# Patient Record
Sex: Female | Born: 1965
Health system: Southern US, Community
[De-identification: ages and names within clinical notes are randomized; demographics above are authoritative.]

## PROBLEM LIST (undated history)

## (undated) DIAGNOSIS — J40 Bronchitis, not specified as acute or chronic: Secondary | ICD-10-CM

## (undated) DIAGNOSIS — N63 Unspecified lump in unspecified breast: Secondary | ICD-10-CM

## (undated) DIAGNOSIS — N200 Calculus of kidney: Secondary | ICD-10-CM

## (undated) DIAGNOSIS — E669 Obesity, unspecified: Secondary | ICD-10-CM

## (undated) DIAGNOSIS — N920 Excessive and frequent menstruation with regular cycle: Secondary | ICD-10-CM

## (undated) DIAGNOSIS — E282 Polycystic ovarian syndrome: Secondary | ICD-10-CM

## (undated) DIAGNOSIS — K219 Gastro-esophageal reflux disease without esophagitis: Secondary | ICD-10-CM

## (undated) DIAGNOSIS — F329 Major depressive disorder, single episode, unspecified: Secondary | ICD-10-CM

## (undated) DIAGNOSIS — B009 Herpesviral infection, unspecified: Secondary | ICD-10-CM

## (undated) DIAGNOSIS — E876 Hypokalemia: Secondary | ICD-10-CM

## (undated) DIAGNOSIS — F32A Depression, unspecified: Secondary | ICD-10-CM

## (undated) DIAGNOSIS — K602 Anal fissure, unspecified: Secondary | ICD-10-CM

## (undated) DIAGNOSIS — G4733 Obstructive sleep apnea (adult) (pediatric): Secondary | ICD-10-CM

## (undated) DIAGNOSIS — G5603 Carpal tunnel syndrome, bilateral upper limbs: Secondary | ICD-10-CM

## (undated) DIAGNOSIS — G4730 Sleep apnea, unspecified: Secondary | ICD-10-CM

## (undated) HISTORY — DX: Carpal tunnel syndrome, bilateral upper limbs: G56.03

## (undated) HISTORY — DX: Calculus of kidney: N20.0

## (undated) HISTORY — DX: Hypokalemia: E87.6

## (undated) HISTORY — DX: Polycystic ovarian syndrome: E28.2

## (undated) HISTORY — DX: Herpesviral infection, unspecified: B00.9

## (undated) HISTORY — DX: Anal fissure, unspecified: K60.2

## (undated) HISTORY — DX: Gastro-esophageal reflux disease without esophagitis: K21.9

## (undated) HISTORY — DX: Major depressive disorder, single episode, unspecified: F32.9

## (undated) HISTORY — DX: Obstructive sleep apnea (adult) (pediatric): G47.33

## (undated) HISTORY — DX: Excessive and frequent menstruation with regular cycle: N92.0

## (undated) HISTORY — DX: Unspecified lump in unspecified breast: N63.0

## (undated) HISTORY — DX: Bronchitis, not specified as acute or chronic: J40

## (undated) HISTORY — DX: Sleep apnea, unspecified: G47.30

## (undated) HISTORY — DX: Depression, unspecified: F32.A

## (undated) HISTORY — DX: Obesity, unspecified: E66.9

---

## 1989-12-30 HISTORY — PX: TUBAL LIGATION: SHX77

## 1998-08-22 ENCOUNTER — Emergency Department (HOSPITAL_COMMUNITY): Admission: EM | Admit: 1998-08-22 | Discharge: 1998-08-22 | Payer: Self-pay | Admitting: Emergency Medicine

## 1998-10-24 ENCOUNTER — Emergency Department (HOSPITAL_COMMUNITY): Admission: EM | Admit: 1998-10-24 | Discharge: 1998-10-24 | Payer: Self-pay | Admitting: Emergency Medicine

## 1999-01-22 ENCOUNTER — Emergency Department (HOSPITAL_COMMUNITY): Admission: EM | Admit: 1999-01-22 | Discharge: 1999-01-22 | Payer: Self-pay | Admitting: Emergency Medicine

## 1999-05-27 ENCOUNTER — Emergency Department (HOSPITAL_COMMUNITY): Admission: EM | Admit: 1999-05-27 | Discharge: 1999-05-27 | Payer: Self-pay | Admitting: Emergency Medicine

## 1999-05-27 ENCOUNTER — Encounter: Payer: Self-pay | Admitting: Emergency Medicine

## 1999-08-12 ENCOUNTER — Emergency Department (HOSPITAL_COMMUNITY): Admission: EM | Admit: 1999-08-12 | Discharge: 1999-08-12 | Payer: Self-pay | Admitting: Emergency Medicine

## 1999-09-01 ENCOUNTER — Emergency Department (HOSPITAL_COMMUNITY): Admission: EM | Admit: 1999-09-01 | Discharge: 1999-09-01 | Payer: Self-pay | Admitting: Emergency Medicine

## 2000-01-18 ENCOUNTER — Emergency Department (HOSPITAL_COMMUNITY): Admission: EM | Admit: 2000-01-18 | Discharge: 2000-01-18 | Payer: Self-pay | Admitting: Emergency Medicine

## 2000-06-29 ENCOUNTER — Emergency Department (HOSPITAL_COMMUNITY): Admission: EM | Admit: 2000-06-29 | Discharge: 2000-06-30 | Payer: Self-pay | Admitting: Emergency Medicine

## 2001-05-17 ENCOUNTER — Emergency Department (HOSPITAL_COMMUNITY): Admission: EM | Admit: 2001-05-17 | Discharge: 2001-05-17 | Payer: Self-pay | Admitting: Emergency Medicine

## 2001-05-17 ENCOUNTER — Encounter: Payer: Self-pay | Admitting: Emergency Medicine

## 2001-10-03 ENCOUNTER — Emergency Department (HOSPITAL_COMMUNITY): Admission: EM | Admit: 2001-10-03 | Discharge: 2001-10-03 | Payer: Self-pay | Admitting: Emergency Medicine

## 2002-01-13 ENCOUNTER — Encounter: Admission: RE | Admit: 2002-01-13 | Discharge: 2002-01-13 | Payer: Self-pay

## 2002-01-15 ENCOUNTER — Encounter: Admission: RE | Admit: 2002-01-15 | Discharge: 2002-01-15 | Payer: Self-pay | Admitting: Internal Medicine

## 2002-01-27 ENCOUNTER — Encounter: Admission: RE | Admit: 2002-01-27 | Discharge: 2002-01-27 | Payer: Self-pay

## 2002-02-03 ENCOUNTER — Emergency Department (HOSPITAL_COMMUNITY): Admission: EM | Admit: 2002-02-03 | Discharge: 2002-02-03 | Payer: Self-pay | Admitting: Physical Therapy

## 2002-02-09 ENCOUNTER — Encounter: Admission: RE | Admit: 2002-02-09 | Discharge: 2002-02-09 | Payer: Self-pay | Admitting: Internal Medicine

## 2002-03-02 ENCOUNTER — Encounter: Admission: RE | Admit: 2002-03-02 | Discharge: 2002-03-02 | Payer: Self-pay | Admitting: Internal Medicine

## 2002-03-19 ENCOUNTER — Encounter: Admission: RE | Admit: 2002-03-19 | Discharge: 2002-03-19 | Payer: Self-pay | Admitting: Internal Medicine

## 2002-03-29 ENCOUNTER — Emergency Department (HOSPITAL_COMMUNITY): Admission: EM | Admit: 2002-03-29 | Discharge: 2002-03-29 | Payer: Self-pay | Admitting: Emergency Medicine

## 2002-04-05 ENCOUNTER — Encounter: Admission: RE | Admit: 2002-04-05 | Discharge: 2002-04-05 | Payer: Self-pay | Admitting: Internal Medicine

## 2002-06-08 ENCOUNTER — Encounter: Admission: RE | Admit: 2002-06-08 | Discharge: 2002-06-08 | Payer: Self-pay | Admitting: Internal Medicine

## 2002-06-23 ENCOUNTER — Emergency Department (HOSPITAL_COMMUNITY): Admission: EM | Admit: 2002-06-23 | Discharge: 2002-06-23 | Payer: Self-pay | Admitting: Emergency Medicine

## 2002-07-01 ENCOUNTER — Encounter: Admission: RE | Admit: 2002-07-01 | Discharge: 2002-07-01 | Payer: Self-pay | Admitting: Internal Medicine

## 2002-07-07 ENCOUNTER — Encounter: Payer: Self-pay | Admitting: Emergency Medicine

## 2002-07-07 ENCOUNTER — Emergency Department (HOSPITAL_COMMUNITY): Admission: EM | Admit: 2002-07-07 | Discharge: 2002-07-07 | Payer: Self-pay | Admitting: Emergency Medicine

## 2002-07-08 ENCOUNTER — Encounter: Admission: RE | Admit: 2002-07-08 | Discharge: 2002-07-08 | Payer: Self-pay | Admitting: Internal Medicine

## 2002-09-01 ENCOUNTER — Encounter: Admission: RE | Admit: 2002-09-01 | Discharge: 2002-09-01 | Payer: Self-pay | Admitting: Internal Medicine

## 2002-09-08 ENCOUNTER — Encounter: Admission: RE | Admit: 2002-09-08 | Discharge: 2002-09-08 | Payer: Self-pay | Admitting: Internal Medicine

## 2002-09-10 ENCOUNTER — Ambulatory Visit (HOSPITAL_COMMUNITY): Admission: RE | Admit: 2002-09-10 | Discharge: 2002-09-10 | Payer: Self-pay | Admitting: Internal Medicine

## 2002-11-10 ENCOUNTER — Encounter: Admission: RE | Admit: 2002-11-10 | Discharge: 2002-11-10 | Payer: Self-pay | Admitting: Internal Medicine

## 2002-11-23 ENCOUNTER — Encounter: Admission: RE | Admit: 2002-11-23 | Discharge: 2003-02-21 | Payer: Self-pay | Admitting: Internal Medicine

## 2003-01-27 ENCOUNTER — Encounter: Admission: RE | Admit: 2003-01-27 | Discharge: 2003-01-27 | Payer: Self-pay | Admitting: Internal Medicine

## 2003-02-17 ENCOUNTER — Ambulatory Visit (HOSPITAL_COMMUNITY): Admission: RE | Admit: 2003-02-17 | Discharge: 2003-02-17 | Payer: Self-pay | Admitting: Internal Medicine

## 2003-03-01 ENCOUNTER — Encounter: Admission: RE | Admit: 2003-03-01 | Discharge: 2003-03-01 | Payer: Self-pay | Admitting: Internal Medicine

## 2003-03-28 ENCOUNTER — Encounter: Admission: RE | Admit: 2003-03-28 | Discharge: 2003-03-28 | Payer: Self-pay | Admitting: Internal Medicine

## 2003-04-04 ENCOUNTER — Encounter: Admission: RE | Admit: 2003-04-04 | Discharge: 2003-04-04 | Payer: Self-pay | Admitting: Internal Medicine

## 2003-04-19 ENCOUNTER — Encounter: Admission: RE | Admit: 2003-04-19 | Discharge: 2003-04-19 | Payer: Self-pay | Admitting: Internal Medicine

## 2003-06-06 ENCOUNTER — Encounter: Admission: RE | Admit: 2003-06-06 | Discharge: 2003-06-06 | Payer: Self-pay | Admitting: Internal Medicine

## 2003-06-23 ENCOUNTER — Encounter: Admission: RE | Admit: 2003-06-23 | Discharge: 2003-06-23 | Payer: Self-pay | Admitting: Internal Medicine

## 2003-07-07 ENCOUNTER — Ambulatory Visit (HOSPITAL_COMMUNITY): Admission: RE | Admit: 2003-07-07 | Discharge: 2003-07-07 | Payer: Self-pay | Admitting: Internal Medicine

## 2003-07-07 ENCOUNTER — Encounter: Admission: RE | Admit: 2003-07-07 | Discharge: 2003-07-07 | Payer: Self-pay | Admitting: Internal Medicine

## 2003-07-07 ENCOUNTER — Encounter: Payer: Self-pay | Admitting: Internal Medicine

## 2003-07-19 ENCOUNTER — Encounter: Admission: RE | Admit: 2003-07-19 | Discharge: 2003-07-19 | Payer: Self-pay | Admitting: Internal Medicine

## 2003-08-25 ENCOUNTER — Encounter: Admission: RE | Admit: 2003-08-25 | Discharge: 2003-08-25 | Payer: Self-pay | Admitting: Internal Medicine

## 2003-10-12 ENCOUNTER — Encounter: Admission: RE | Admit: 2003-10-12 | Discharge: 2003-10-12 | Payer: Self-pay | Admitting: Internal Medicine

## 2003-12-11 ENCOUNTER — Emergency Department (HOSPITAL_COMMUNITY): Admission: AD | Admit: 2003-12-11 | Discharge: 2003-12-11 | Payer: Self-pay | Admitting: Family Medicine

## 2004-01-12 ENCOUNTER — Encounter: Admission: RE | Admit: 2004-01-12 | Discharge: 2004-01-12 | Payer: Self-pay | Admitting: Internal Medicine

## 2004-06-15 ENCOUNTER — Encounter: Admission: RE | Admit: 2004-06-15 | Discharge: 2004-06-15 | Payer: Self-pay | Admitting: Internal Medicine

## 2004-06-22 ENCOUNTER — Encounter: Admission: RE | Admit: 2004-06-22 | Discharge: 2004-06-22 | Payer: Self-pay | Admitting: Internal Medicine

## 2004-07-23 ENCOUNTER — Encounter: Admission: RE | Admit: 2004-07-23 | Discharge: 2004-10-21 | Payer: Self-pay | Admitting: Internal Medicine

## 2004-08-28 ENCOUNTER — Encounter: Admission: RE | Admit: 2004-08-28 | Discharge: 2004-08-28 | Payer: Self-pay | Admitting: Internal Medicine

## 2004-09-21 ENCOUNTER — Ambulatory Visit: Payer: Self-pay | Admitting: Internal Medicine

## 2004-11-14 ENCOUNTER — Ambulatory Visit: Payer: Self-pay | Admitting: Internal Medicine

## 2004-11-29 ENCOUNTER — Encounter: Admission: RE | Admit: 2004-11-29 | Discharge: 2005-02-27 | Payer: Self-pay | Admitting: Internal Medicine

## 2005-01-17 ENCOUNTER — Ambulatory Visit: Payer: Self-pay | Admitting: Internal Medicine

## 2005-01-23 ENCOUNTER — Ambulatory Visit (HOSPITAL_BASED_OUTPATIENT_CLINIC_OR_DEPARTMENT_OTHER): Admission: RE | Admit: 2005-01-23 | Discharge: 2005-01-23 | Payer: Self-pay | Admitting: Internal Medicine

## 2005-04-19 ENCOUNTER — Ambulatory Visit: Payer: Self-pay | Admitting: Internal Medicine

## 2005-11-11 ENCOUNTER — Emergency Department (HOSPITAL_COMMUNITY): Admission: EM | Admit: 2005-11-11 | Discharge: 2005-11-11 | Payer: Self-pay | Admitting: *Deleted

## 2005-11-19 ENCOUNTER — Ambulatory Visit: Payer: Self-pay | Admitting: Internal Medicine

## 2005-12-08 ENCOUNTER — Emergency Department (HOSPITAL_COMMUNITY): Admission: EM | Admit: 2005-12-08 | Discharge: 2005-12-08 | Payer: Self-pay | Admitting: Emergency Medicine

## 2006-01-13 ENCOUNTER — Ambulatory Visit: Payer: Self-pay | Admitting: Internal Medicine

## 2006-01-18 ENCOUNTER — Emergency Department (HOSPITAL_COMMUNITY): Admission: EM | Admit: 2006-01-18 | Discharge: 2006-01-18 | Payer: Self-pay | Admitting: Emergency Medicine

## 2006-01-31 ENCOUNTER — Ambulatory Visit: Payer: Self-pay | Admitting: Internal Medicine

## 2006-03-25 ENCOUNTER — Ambulatory Visit: Payer: Self-pay | Admitting: Internal Medicine

## 2006-04-09 ENCOUNTER — Ambulatory Visit: Payer: Self-pay | Admitting: Internal Medicine

## 2006-04-21 ENCOUNTER — Ambulatory Visit: Payer: Self-pay | Admitting: Internal Medicine

## 2006-04-29 ENCOUNTER — Ambulatory Visit: Payer: Self-pay | Admitting: Hospitalist

## 2006-05-02 ENCOUNTER — Emergency Department (HOSPITAL_COMMUNITY): Admission: EM | Admit: 2006-05-02 | Discharge: 2006-05-02 | Payer: Self-pay | Admitting: Family Medicine

## 2006-05-06 ENCOUNTER — Ambulatory Visit: Payer: Self-pay | Admitting: Internal Medicine

## 2006-05-09 ENCOUNTER — Ambulatory Visit: Payer: Self-pay | Admitting: *Deleted

## 2006-05-20 ENCOUNTER — Ambulatory Visit: Payer: Self-pay | Admitting: Internal Medicine

## 2006-06-11 ENCOUNTER — Ambulatory Visit: Payer: Self-pay | Admitting: Internal Medicine

## 2006-06-17 ENCOUNTER — Ambulatory Visit: Payer: Self-pay | Admitting: Internal Medicine

## 2006-06-18 ENCOUNTER — Ambulatory Visit: Payer: Self-pay | Admitting: Internal Medicine

## 2006-07-08 ENCOUNTER — Ambulatory Visit: Payer: Self-pay | Admitting: Hospitalist

## 2006-07-08 ENCOUNTER — Encounter (INDEPENDENT_AMBULATORY_CARE_PROVIDER_SITE_OTHER): Payer: Self-pay | Admitting: Dermatology

## 2006-07-08 LAB — CONVERTED CEMR LAB: Pap Smear: NORMAL

## 2006-07-11 ENCOUNTER — Ambulatory Visit (HOSPITAL_COMMUNITY): Admission: RE | Admit: 2006-07-11 | Discharge: 2006-07-11 | Payer: Self-pay | Admitting: Internal Medicine

## 2006-08-27 ENCOUNTER — Ambulatory Visit: Payer: Self-pay | Admitting: Hospitalist

## 2006-09-10 ENCOUNTER — Ambulatory Visit: Payer: Self-pay | Admitting: Internal Medicine

## 2006-11-26 ENCOUNTER — Ambulatory Visit: Payer: Self-pay | Admitting: Hospitalist

## 2006-12-04 ENCOUNTER — Encounter (INDEPENDENT_AMBULATORY_CARE_PROVIDER_SITE_OTHER): Payer: Self-pay | Admitting: Dermatology

## 2006-12-04 DIAGNOSIS — B009 Herpesviral infection, unspecified: Secondary | ICD-10-CM | POA: Insufficient documentation

## 2006-12-04 DIAGNOSIS — E282 Polycystic ovarian syndrome: Secondary | ICD-10-CM

## 2006-12-04 DIAGNOSIS — G4733 Obstructive sleep apnea (adult) (pediatric): Secondary | ICD-10-CM | POA: Insufficient documentation

## 2006-12-04 DIAGNOSIS — F3340 Major depressive disorder, recurrent, in remission, unspecified: Secondary | ICD-10-CM | POA: Insufficient documentation

## 2006-12-04 DIAGNOSIS — K219 Gastro-esophageal reflux disease without esophagitis: Secondary | ICD-10-CM

## 2006-12-04 DIAGNOSIS — G5600 Carpal tunnel syndrome, unspecified upper limb: Secondary | ICD-10-CM | POA: Insufficient documentation

## 2006-12-04 DIAGNOSIS — E1142 Type 2 diabetes mellitus with diabetic polyneuropathy: Secondary | ICD-10-CM | POA: Insufficient documentation

## 2006-12-04 DIAGNOSIS — Z87442 Personal history of urinary calculi: Secondary | ICD-10-CM

## 2006-12-04 DIAGNOSIS — E669 Obesity, unspecified: Secondary | ICD-10-CM

## 2006-12-04 DIAGNOSIS — F329 Major depressive disorder, single episode, unspecified: Secondary | ICD-10-CM | POA: Insufficient documentation

## 2006-12-05 ENCOUNTER — Ambulatory Visit: Payer: Self-pay | Admitting: Internal Medicine

## 2006-12-05 ENCOUNTER — Encounter (INDEPENDENT_AMBULATORY_CARE_PROVIDER_SITE_OTHER): Payer: Self-pay | Admitting: Dermatology

## 2006-12-05 LAB — CONVERTED CEMR LAB
CO2: 29 meq/L (ref 19–32)
Calcium: 9.9 mg/dL (ref 8.4–10.5)
Chloride: 101 meq/L (ref 96–112)
Creatinine, Ser: 0.73 mg/dL (ref 0.40–1.20)
Glucose, Bld: 128 mg/dL — ABNORMAL HIGH (ref 70–99)
Potassium: 4.2 meq/L (ref 3.5–5.3)
Sodium: 137 meq/L (ref 135–145)

## 2006-12-06 ENCOUNTER — Emergency Department (HOSPITAL_COMMUNITY): Admission: EM | Admit: 2006-12-06 | Discharge: 2006-12-06 | Payer: Self-pay | Admitting: Emergency Medicine

## 2007-01-06 ENCOUNTER — Ambulatory Visit: Payer: Self-pay | Admitting: Internal Medicine

## 2007-01-08 ENCOUNTER — Encounter: Admission: RE | Admit: 2007-01-08 | Discharge: 2007-01-08 | Payer: Self-pay | Admitting: Internal Medicine

## 2007-08-24 ENCOUNTER — Emergency Department (HOSPITAL_COMMUNITY): Admission: EM | Admit: 2007-08-24 | Discharge: 2007-08-25 | Payer: Self-pay | Admitting: Emergency Medicine

## 2007-09-03 ENCOUNTER — Ambulatory Visit: Payer: Self-pay | Admitting: Internal Medicine

## 2007-09-03 ENCOUNTER — Encounter (INDEPENDENT_AMBULATORY_CARE_PROVIDER_SITE_OTHER): Payer: Self-pay | Admitting: *Deleted

## 2007-09-03 LAB — CONVERTED CEMR LAB
Blood Glucose, Fingerstick: 224
Hgb A1c MFr Bld: 7.9 %

## 2007-09-24 ENCOUNTER — Encounter (INDEPENDENT_AMBULATORY_CARE_PROVIDER_SITE_OTHER): Payer: Self-pay | Admitting: Internal Medicine

## 2007-09-29 ENCOUNTER — Encounter (INDEPENDENT_AMBULATORY_CARE_PROVIDER_SITE_OTHER): Payer: Self-pay | Admitting: *Deleted

## 2007-09-29 ENCOUNTER — Ambulatory Visit: Payer: Self-pay | Admitting: Hospitalist

## 2007-09-29 ENCOUNTER — Encounter (INDEPENDENT_AMBULATORY_CARE_PROVIDER_SITE_OTHER): Payer: Self-pay | Admitting: Internal Medicine

## 2007-09-29 DIAGNOSIS — K8020 Calculus of gallbladder without cholecystitis without obstruction: Secondary | ICD-10-CM | POA: Insufficient documentation

## 2007-09-29 LAB — CONVERTED CEMR LAB
BUN: 13 mg/dL (ref 6–23)
Blood Glucose, Fingerstick: 186
CO2: 24 meq/L (ref 19–32)
Calcium: 9.4 mg/dL (ref 8.4–10.5)
Chloride: 105 meq/L (ref 96–112)
Creatinine, Ser: 0.72 mg/dL (ref 0.40–1.20)
Glucose, Bld: 166 mg/dL — ABNORMAL HIGH (ref 70–99)
Potassium: 4.1 meq/L (ref 3.5–5.3)
Sodium: 140 meq/L (ref 135–145)

## 2007-10-05 ENCOUNTER — Telehealth: Payer: Self-pay | Admitting: *Deleted

## 2007-10-05 DIAGNOSIS — N63 Unspecified lump in unspecified breast: Secondary | ICD-10-CM

## 2007-10-06 ENCOUNTER — Encounter: Admission: RE | Admit: 2007-10-06 | Discharge: 2007-10-06 | Payer: Self-pay | Admitting: Internal Medicine

## 2007-10-07 ENCOUNTER — Telehealth: Payer: Self-pay | Admitting: *Deleted

## 2007-10-08 ENCOUNTER — Emergency Department (HOSPITAL_COMMUNITY): Admission: EM | Admit: 2007-10-08 | Discharge: 2007-10-09 | Payer: Self-pay | Admitting: Emergency Medicine

## 2007-10-31 HISTORY — PX: CHOLECYSTECTOMY: SHX55

## 2007-11-18 ENCOUNTER — Encounter (INDEPENDENT_AMBULATORY_CARE_PROVIDER_SITE_OTHER): Payer: Self-pay | Admitting: Internal Medicine

## 2007-11-24 ENCOUNTER — Ambulatory Visit (HOSPITAL_COMMUNITY): Admission: RE | Admit: 2007-11-24 | Discharge: 2007-11-24 | Payer: Self-pay | Admitting: *Deleted

## 2007-11-24 ENCOUNTER — Encounter (INDEPENDENT_AMBULATORY_CARE_PROVIDER_SITE_OTHER): Payer: Self-pay | Admitting: *Deleted

## 2007-12-01 ENCOUNTER — Telehealth: Payer: Self-pay | Admitting: *Deleted

## 2008-01-14 ENCOUNTER — Encounter (INDEPENDENT_AMBULATORY_CARE_PROVIDER_SITE_OTHER): Payer: Self-pay | Admitting: Internal Medicine

## 2008-01-14 ENCOUNTER — Ambulatory Visit: Payer: Self-pay | Admitting: Internal Medicine

## 2008-01-14 DIAGNOSIS — I10 Essential (primary) hypertension: Secondary | ICD-10-CM | POA: Insufficient documentation

## 2008-01-14 DIAGNOSIS — N898 Other specified noninflammatory disorders of vagina: Secondary | ICD-10-CM | POA: Insufficient documentation

## 2008-01-14 LAB — CONVERTED CEMR LAB
BUN: 12 mg/dL (ref 6–23)
Blood Glucose, Fingerstick: 252
CO2: 23 meq/L (ref 19–32)
Calcium: 9.2 mg/dL (ref 8.4–10.5)
Chlamydia, DNA Probe: NEGATIVE
Creatinine, Ser: 0.68 mg/dL (ref 0.40–1.20)
Creatinine, Urine: 66.8 mg/dL
GC Probe Amp, Genital: NEGATIVE
Glucose, Bld: 220 mg/dL — ABNORMAL HIGH (ref 70–99)
Hgb A1c MFr Bld: 7.8 %
Microalb Creat Ratio: 37 mg/g — ABNORMAL HIGH (ref 0.0–30.0)
Microalb, Ur: 2.47 mg/dL — ABNORMAL HIGH (ref 0.00–1.89)
Potassium: 4.1 meq/L (ref 3.5–5.3)
Sodium: 139 meq/L (ref 135–145)

## 2008-01-15 LAB — CONVERTED CEMR LAB
Candida species: NEGATIVE
Gardnerella vaginalis: POSITIVE — AB
Trichomonal Vaginitis: NEGATIVE

## 2008-01-18 ENCOUNTER — Ambulatory Visit: Payer: Self-pay | Admitting: Internal Medicine

## 2008-01-18 LAB — CONVERTED CEMR LAB
Blood Glucose, Fingerstick: 230
Blood Glucose, Home Monitor: 1 mg/dL

## 2008-06-09 ENCOUNTER — Encounter (INDEPENDENT_AMBULATORY_CARE_PROVIDER_SITE_OTHER): Payer: Self-pay | Admitting: *Deleted

## 2008-06-09 ENCOUNTER — Ambulatory Visit: Payer: Self-pay | Admitting: Internal Medicine

## 2008-06-09 DIAGNOSIS — L293 Anogenital pruritus, unspecified: Secondary | ICD-10-CM

## 2008-06-09 DIAGNOSIS — R42 Dizziness and giddiness: Secondary | ICD-10-CM

## 2008-06-09 LAB — CONVERTED CEMR LAB
ALT: 20 units/L (ref 0–35)
AST: 35 units/L (ref 0–37)
Albumin: 4.4 g/dL (ref 3.5–5.2)
Alkaline Phosphatase: 72 units/L (ref 39–117)
BUN: 15 mg/dL (ref 6–23)
Bilirubin Urine: NEGATIVE
CO2: 25 meq/L (ref 19–32)
Calcium: 9.8 mg/dL (ref 8.4–10.5)
Chloride: 104 meq/L (ref 96–112)
Creatinine, Ser: 0.69 mg/dL (ref 0.40–1.20)
HCT: 43.3 % (ref 36.0–46.0)
Hemoglobin, Urine: NEGATIVE
Hemoglobin: 14.6 g/dL (ref 12.0–15.0)
Ketones, ur: NEGATIVE mg/dL
MCHC: 33.7 g/dL (ref 30.0–36.0)
MCV: 88.5 fL (ref 78.0–100.0)
Nitrite: NEGATIVE
Platelets: 277 10*3/uL (ref 150–400)
Potassium: 4.6 meq/L (ref 3.5–5.3)
Protein, ur: NEGATIVE mg/dL
RBC / HPF: NONE SEEN (ref <3)
RDW: 13.4 % (ref 11.5–15.5)
Sodium: 141 meq/L (ref 135–145)
Specific Gravity, Urine: 1.028 (ref 1.005–1.03)
Total Bilirubin: 0.5 mg/dL (ref 0.3–1.2)
Total Protein: 7.4 g/dL (ref 6.0–8.3)
Urine Glucose: >1000 mg/dL — AB
Urobilinogen, UA: 0.2 (ref 0.0–1.0)
WBC: 7.5 10*3/uL (ref 4.0–10.5)
pH: 5.5 (ref 5.0–8.0)

## 2008-06-29 ENCOUNTER — Ambulatory Visit: Payer: Self-pay | Admitting: Internal Medicine

## 2008-06-29 ENCOUNTER — Encounter (INDEPENDENT_AMBULATORY_CARE_PROVIDER_SITE_OTHER): Payer: Self-pay | Admitting: Internal Medicine

## 2008-06-29 DIAGNOSIS — K602 Anal fissure, unspecified: Secondary | ICD-10-CM

## 2008-06-29 LAB — CONVERTED CEMR LAB
Blood Glucose, Fingerstick: 187
Candida species: NEGATIVE
Chlamydia, DNA Probe: NEGATIVE
GC Probe Amp, Genital: NEGATIVE
Gardnerella vaginalis: NEGATIVE
Trichomonal Vaginitis: NEGATIVE

## 2008-07-20 ENCOUNTER — Ambulatory Visit: Payer: Self-pay | Admitting: Infectious Diseases

## 2008-07-20 ENCOUNTER — Encounter (INDEPENDENT_AMBULATORY_CARE_PROVIDER_SITE_OTHER): Payer: Self-pay | Admitting: Internal Medicine

## 2008-07-20 ENCOUNTER — Ambulatory Visit (HOSPITAL_COMMUNITY): Admission: RE | Admit: 2008-07-20 | Discharge: 2008-07-20 | Payer: Self-pay | Admitting: Infectious Diseases

## 2008-07-20 DIAGNOSIS — M79609 Pain in unspecified limb: Secondary | ICD-10-CM

## 2008-07-21 LAB — CONVERTED CEMR LAB
Cholesterol: 178 mg/dL (ref 0–200)
HDL: 45 mg/dL (ref >39)
LDL Cholesterol: 87 mg/dL (ref 0–99)
Total CHOL/HDL Ratio: 4
Triglycerides: 231 mg/dL — ABNORMAL HIGH (ref <150)
VLDL: 46 mg/dL — ABNORMAL HIGH (ref 0–40)

## 2008-09-06 ENCOUNTER — Encounter (INDEPENDENT_AMBULATORY_CARE_PROVIDER_SITE_OTHER): Payer: Self-pay | Admitting: Internal Medicine

## 2008-09-06 ENCOUNTER — Ambulatory Visit: Payer: Self-pay | Admitting: Internal Medicine

## 2008-09-06 DIAGNOSIS — R197 Diarrhea, unspecified: Secondary | ICD-10-CM | POA: Insufficient documentation

## 2008-09-06 DIAGNOSIS — M25519 Pain in unspecified shoulder: Secondary | ICD-10-CM | POA: Insufficient documentation

## 2008-09-06 LAB — CONVERTED CEMR LAB
Blood Glucose, Fingerstick: 216
Hgb A1c MFr Bld: 7.1 %

## 2008-09-20 ENCOUNTER — Ambulatory Visit: Payer: Self-pay | Admitting: Internal Medicine

## 2008-09-20 ENCOUNTER — Encounter (INDEPENDENT_AMBULATORY_CARE_PROVIDER_SITE_OTHER): Payer: Self-pay | Admitting: Internal Medicine

## 2008-09-20 DIAGNOSIS — M7710 Lateral epicondylitis, unspecified elbow: Secondary | ICD-10-CM | POA: Insufficient documentation

## 2008-09-20 LAB — CONVERTED CEMR LAB
Blood Glucose, Fingerstick: 224
Creatinine, Urine: 60.5 mg/dL
Microalb Creat Ratio: 17.4 mg/g (ref 0.0–30.0)
Microalb, Ur: 1.05 mg/dL (ref 0.00–1.89)

## 2008-09-21 ENCOUNTER — Ambulatory Visit: Payer: Self-pay | Admitting: Internal Medicine

## 2008-09-22 ENCOUNTER — Encounter: Payer: Self-pay | Admitting: Internal Medicine

## 2008-10-07 ENCOUNTER — Emergency Department (HOSPITAL_COMMUNITY): Admission: EM | Admit: 2008-10-07 | Discharge: 2008-10-07 | Payer: Self-pay | Admitting: Family Medicine

## 2008-10-25 ENCOUNTER — Telehealth (INDEPENDENT_AMBULATORY_CARE_PROVIDER_SITE_OTHER): Payer: Self-pay | Admitting: Internal Medicine

## 2008-10-27 ENCOUNTER — Ambulatory Visit: Payer: Self-pay | Admitting: Internal Medicine

## 2008-10-27 ENCOUNTER — Encounter (INDEPENDENT_AMBULATORY_CARE_PROVIDER_SITE_OTHER): Payer: Self-pay | Admitting: Internal Medicine

## 2008-10-28 ENCOUNTER — Encounter (INDEPENDENT_AMBULATORY_CARE_PROVIDER_SITE_OTHER): Payer: Self-pay | Admitting: Internal Medicine

## 2008-10-28 LAB — CONVERTED CEMR LAB
Candida species: NEGATIVE
Gardnerella vaginalis: NEGATIVE
Trichomonal Vaginitis: NEGATIVE

## 2009-02-09 ENCOUNTER — Emergency Department (HOSPITAL_COMMUNITY): Admission: EM | Admit: 2009-02-09 | Discharge: 2009-02-09 | Payer: Self-pay | Admitting: Emergency Medicine

## 2009-02-21 ENCOUNTER — Ambulatory Visit: Payer: Self-pay | Admitting: Internal Medicine

## 2009-02-21 ENCOUNTER — Encounter (INDEPENDENT_AMBULATORY_CARE_PROVIDER_SITE_OTHER): Payer: Self-pay | Admitting: Internal Medicine

## 2009-02-21 LAB — CONVERTED CEMR LAB
BUN: 8 mg/dL (ref 6–23)
CO2: 22 meq/L (ref 19–32)
Chloride: 105 meq/L (ref 96–112)
Potassium: 4 meq/L (ref 3.5–5.3)
Sodium: 140 meq/L (ref 135–145)

## 2009-04-21 ENCOUNTER — Telehealth: Payer: Self-pay | Admitting: *Deleted

## 2009-04-26 ENCOUNTER — Encounter (INDEPENDENT_AMBULATORY_CARE_PROVIDER_SITE_OTHER): Payer: Self-pay | Admitting: Internal Medicine

## 2009-12-03 LAB — HM MAMMOGRAPHY

## 2009-12-04 ENCOUNTER — Ambulatory Visit: Payer: Self-pay | Admitting: Internal Medicine

## 2009-12-04 DIAGNOSIS — L509 Urticaria, unspecified: Secondary | ICD-10-CM | POA: Insufficient documentation

## 2009-12-04 LAB — CONVERTED CEMR LAB
Blood Glucose, Fingerstick: 291
Hgb A1c MFr Bld: 9.1 %

## 2010-01-03 ENCOUNTER — Telehealth (INDEPENDENT_AMBULATORY_CARE_PROVIDER_SITE_OTHER): Payer: Self-pay | Admitting: Internal Medicine

## 2010-01-04 ENCOUNTER — Ambulatory Visit: Payer: Self-pay | Admitting: Internal Medicine

## 2010-01-04 DIAGNOSIS — J069 Acute upper respiratory infection, unspecified: Secondary | ICD-10-CM | POA: Insufficient documentation

## 2010-01-04 DIAGNOSIS — L299 Pruritus, unspecified: Secondary | ICD-10-CM | POA: Insufficient documentation

## 2010-01-04 LAB — CONVERTED CEMR LAB: Blood Glucose, Fingerstick: 216

## 2010-01-05 LAB — CONVERTED CEMR LAB
ALT: 14 units/L (ref 0–35)
AST: 29 units/L (ref 0–37)
Albumin: 4.1 g/dL (ref 3.5–5.2)
Alkaline Phosphatase: 61 units/L (ref 39–117)
BUN: 7 mg/dL (ref 6–23)
Basophils Absolute: 0 10*3/uL (ref 0.0–0.1)
Basophils Relative: 0 % (ref 0–1)
CO2: 26 meq/L (ref 19–32)
Calcium: 9 mg/dL (ref 8.4–10.5)
Chloride: 99 meq/L (ref 96–112)
Creatinine, Ser: 0.58 mg/dL (ref 0.40–1.20)
Eosinophils Absolute: 0.1 10*3/uL (ref 0.0–0.7)
Eosinophils Relative: 1 % (ref 0–5)
Glucose, Bld: 211 mg/dL — ABNORMAL HIGH (ref 70–99)
HCT: 44.1 % (ref 36.0–46.0)
Hemoglobin: 14.6 g/dL (ref 12.0–15.0)
Lymphocytes Relative: 26 % (ref 12–46)
Lymphs Abs: 2.8 10*3/uL (ref 0.7–4.0)
MCHC: 33.1 g/dL (ref 30.0–36.0)
MCV: 87 fL (ref >=78.0)
Monocytes Absolute: 0.9 10*3/uL (ref 0.1–1.0)
Monocytes Relative: 8 % (ref 3–12)
Neutro Abs: 7.1 10*3/uL (ref 1.7–7.7)
Neutrophils Relative %: 65 % (ref 43–77)
Platelets: 251 10*3/uL (ref 150–400)
Potassium: 4 meq/L (ref 3.5–5.3)
RBC: 5.07 M/uL (ref 3.87–5.11)
RDW: 12.7 % (ref 11.5–15.5)
Sodium: 136 meq/L (ref 135–145)
TSH: 1.271 microintl units/mL (ref 0.350–4.5)
Total Bilirubin: 0.6 mg/dL (ref 0.3–1.2)
Total Protein: 6.6 g/dL (ref 6.0–8.3)
WBC: 10.9 10*3/uL — ABNORMAL HIGH (ref 4.0–10.5)

## 2010-05-07 ENCOUNTER — Telehealth (INDEPENDENT_AMBULATORY_CARE_PROVIDER_SITE_OTHER): Payer: Self-pay | Admitting: Internal Medicine

## 2010-05-16 ENCOUNTER — Ambulatory Visit: Payer: Self-pay | Admitting: Infectious Disease

## 2010-05-16 LAB — CONVERTED CEMR LAB
Blood Glucose, AC Bkfst: 349 mg/dL
Hgb A1c MFr Bld: 10.3 %

## 2010-05-22 ENCOUNTER — Ambulatory Visit: Payer: Self-pay | Admitting: Infectious Disease

## 2010-05-22 LAB — CONVERTED CEMR LAB
ALT: 12 units/L (ref 0–35)
AST: 27 units/L (ref 0–37)
Albumin: 4.2 g/dL (ref 3.5–5.2)
Alkaline Phosphatase: 72 units/L (ref 39–117)
BUN: 6 mg/dL (ref 6–23)
CO2: 25 meq/L (ref 19–32)
Calcium: 9.2 mg/dL (ref 8.4–10.5)
Chloride: 102 meq/L (ref 96–112)
Creatinine, Ser: 0.6 mg/dL (ref 0.40–1.20)
Glucose, Bld: 310 mg/dL — ABNORMAL HIGH (ref 70–99)
Potassium: 4.1 meq/L (ref 3.5–5.3)
Sodium: 139 meq/L (ref 135–145)
Tissue Transglutaminase Ab, IgA: 5.3 units (ref <20)
Total Bilirubin: 0.5 mg/dL (ref 0.3–1.2)
Total Protein: 6.7 g/dL (ref 6.0–8.3)

## 2010-06-06 ENCOUNTER — Ambulatory Visit: Payer: Self-pay | Admitting: Internal Medicine

## 2010-06-06 LAB — CONVERTED CEMR LAB

## 2010-06-11 ENCOUNTER — Telehealth (INDEPENDENT_AMBULATORY_CARE_PROVIDER_SITE_OTHER): Payer: Self-pay | Admitting: *Deleted

## 2010-07-19 ENCOUNTER — Emergency Department (HOSPITAL_COMMUNITY): Admission: EM | Admit: 2010-07-19 | Discharge: 2010-07-20 | Payer: Self-pay | Admitting: Emergency Medicine

## 2010-08-06 ENCOUNTER — Ambulatory Visit: Payer: Self-pay | Admitting: Internal Medicine

## 2010-08-06 DIAGNOSIS — E876 Hypokalemia: Secondary | ICD-10-CM

## 2010-08-06 LAB — CONVERTED CEMR LAB
Calcium: 9.4 mg/dL (ref 8.4–10.5)
Cholesterol: 186 mg/dL (ref 0–200)
HDL: 42 mg/dL (ref >39)
Hgb A1c MFr Bld: 9.7 %
Sodium: 135 meq/L (ref 135–145)
Total CHOL/HDL Ratio: 4.4
Triglycerides: 306 mg/dL — ABNORMAL HIGH (ref <150)

## 2010-08-06 LAB — HM DIABETES FOOT EXAM

## 2010-09-10 ENCOUNTER — Telehealth: Payer: Self-pay | Admitting: *Deleted

## 2010-09-13 ENCOUNTER — Encounter: Payer: Self-pay | Admitting: Internal Medicine

## 2010-09-13 LAB — HM DIABETES EYE EXAM: HM Diabetic Eye Exam: NORMAL

## 2010-09-18 ENCOUNTER — Ambulatory Visit: Payer: Self-pay | Admitting: Internal Medicine

## 2010-09-20 LAB — CONVERTED CEMR LAB
BUN: 15 mg/dL (ref 6–23)
Calcium: 8.7 mg/dL (ref 8.4–10.5)
Creatinine, Ser: 0.61 mg/dL (ref 0.40–1.20)
Glucose, Bld: 236 mg/dL — ABNORMAL HIGH (ref 70–99)
Potassium: 4 meq/L (ref 3.5–5.3)

## 2010-10-30 ENCOUNTER — Ambulatory Visit: Payer: Self-pay | Admitting: Internal Medicine

## 2010-10-30 LAB — CONVERTED CEMR LAB: Blood Glucose, Fingerstick: 201

## 2010-11-02 ENCOUNTER — Encounter: Payer: Self-pay | Admitting: Internal Medicine

## 2010-11-06 ENCOUNTER — Telehealth: Payer: Self-pay | Admitting: Licensed Clinical Social Worker

## 2011-01-01 ENCOUNTER — Telehealth: Payer: Self-pay | Admitting: *Deleted

## 2011-01-18 ENCOUNTER — Ambulatory Visit: Admission: RE | Admit: 2011-01-18 | Discharge: 2011-01-18 | Payer: Self-pay | Source: Home / Self Care

## 2011-01-18 DIAGNOSIS — J019 Acute sinusitis, unspecified: Secondary | ICD-10-CM | POA: Insufficient documentation

## 2011-01-18 LAB — CONVERTED CEMR LAB: Blood Glucose, AC Bkfst: 401 mg/dL

## 2011-01-21 LAB — GLUCOSE, CAPILLARY: Glucose-Capillary: 401 mg/dL — ABNORMAL HIGH (ref 70–99)

## 2011-01-29 NOTE — Assessment & Plan Note (Signed)
Summary: ACUTE-LOOSE BOWELS FOR QUITE SOMETIME/(ALVAREZ)/CFB   Vital Signs:  Patient profile:   45 year old female Height:      65.5 inches (166.37 cm) Weight:      248.0 pounds (112.73 kg) BMI:     40.79 Temp:     97.6 degrees F Pulse rate:   88 / minute BP sitting:   111 / 80  (left arm) BP standing:   110 / 78 Cuff size:   large  Vitals Entered By: Dorie Rank RN (August 06, 2010 9:35 AM) CC: s/p diarrhea worse since back on Metformin (also eating tomatoes)  - most days, eating makes her have water BM- sometimes occurs w/o eating - also increased "gas" Is Patient Diabetic? Yes Did you bring your meter with you today? No Nutritional Status BMI of > 30 = obese  Does patient need assistance? Functional Status Self care Ambulation Normal Comments has not been checking blood sugar b/c out of $ to buy strips so has not been taking insulin 2-3 months   Primary Care Provider:  Joaquin Courts  MD  CC:  s/p diarrhea worse since back on Metformin (also eating tomatoes)  - most days and eating makes her have water BM- sometimes occurs w/o eating - also increased "gas".  History of Present Illness: Patient comes today with problem of diarhhea for couple of years and has gotten worse since last visit to the clinic in May.  Patient has diarrhea within an hour of eating each time after she eats.  Complains of increased gas - flatulence mostly.  Diarrhea very watery; cannot remember last formed stool.  No blood.  Some mucus.  Sometimes a very foul odor to the stool. No greasy quality to stool.  Nothing makes it better because it happens sometimes even when doesn't eat.   Once in a great while she will  have suprapubic pain, crampy, that lasts just for a moment after having a bm. 2-3x a month patient may be minimally fecally incontinent - she attributes this to the fact that she has intense urgency when she feels the need to have a bm.  Maybe once a month gets up at night to have bm at night.   In  past patient was recommended to stop dairy, and she did this for one month and noticed no difference.  Restarted metformin at the last visit - patient says that she hasn't noticed a difference with metformin. No urinary symptoms:  no dysuria, no difficulty urinating.    Also, 2/2 financial difficulties, patient has not measured her blood sugar (cannot afford strips) and has not taken insulin for past 2-3 months.  No symptoms per her of low or high blood sugar.  No increased urination, no blurry vision.  Started at 15 and got to 21u at night before ran out of strips.  Patient is not sure if she will be able to afford the strips in the near future.  Patient has the orange card.   No fevers, no cold symptoms, CP.  Some difficulty breathing when in hot weather (no different from normal for her).  No headaches, no dizziness, no N/V.  Pt does complain of some tingling in both of her pinky toes.    Depression History:      The patient denies a depressed mood most of the day and a diminished interest in her usual daily activities.         Preventive Screening-Counseling & Management  Alcohol-Tobacco  Alcohol type: BEER / ONLY ON THE WEEKEND     Smoking Status: quit     Year Quit: 1990'S  Caffeine-Diet-Exercise     Does Patient Exercise: yes     Type of exercise: WALKING     Times/week: 2-3  Current Medications (verified): 1)  Lisinopril-Hydrochlorothiazide 20-25 Mg Tabs (Lisinopril-Hydrochlorothiazide) .... Take 1 Tablet By Mouth Once A Day 2)  Glipizide 10 Mg Tabs (Glipizide) .... Take 1 Tablet By Mouth Two Times A Day 3)  Truetest Test   Strp (Glucose Blood) .... To Test Blood Sugar Once Daily 4)  Lancets   Misc (Lancets) .... To Test Blood Sugar Once Daily 5)  Bd Insulin Syringe Ultrafine 31g X 5/16" 1 Ml Misc (Insulin Syringe-Needle U-100) .... Use As Directed. 6)  Metformin Hcl 1000 Mg Tabs (Metformin Hcl) .... Take 1 Tablet By Mouth Two Times A Day 7)  Cholestyramine  Powd  (Cholestyramine) .... Dissolve 1 Scoop (4grams) of Powder in Juice or Water and Drink Every Morning  Allergies (verified): No Known Drug Allergies  Past History:  Past Medical History: Bronchitis Obesity Renal calculi, hx of  Obstructive sleep apnea Carpal tunnel syndrome Herpes simples type I- vaginal Diarrhea Menorrhagia Depression Diabetes mellitus, type II GERD  Past Surgical History: Reviewed history from 01/14/2008 and no changes required. Tubal ligation- 1991 S/p cholecyctectomy 11/08  Family History: Mom-htn Dad-DM  Social History: Smoked many years ago, etoh 1-2 beers on Friday and [place] illegal drugs, married and lives with husband and sister-in-law, son, daughter, and grandson. Employed - works in [employer].  Review of Systems       see HPI  Physical Exam  General:  alert, appropriate dress, and normal appearance.   Head:  normocephalic.   Nose:  no external erythema and no nasal discharge.   Mouth:  pharynx pink and moist, poor dentition, and teeth missing.   Neck:  full ROM.   Lungs:  normal respiratory effort, no accessory muscle use, normal breath sounds, no crackles, and no wheezes.   Heart:  normal rate, regular rhythm, and no murmur.   Abdomen:  obest.  soft.  mildly tender in epigastrum and suprapubic area to deep palpation, no guarding, no rebound, no rigidity. Msk:  normal ROM and no joint tenderness.   Pulses:  2+ DP/PT pulses bilaterally Extremities:  no edema Neurologic:  alert & oriented X3.  CN grossly intact.  gait normal Psych:  Oriented X3, normally interactive, and good eye contact.    Diabetes Management Exam:    Foot Exam (with socks and/or shoes not present):       Sensory-Pinprick/Light touch:          Left medial foot (L-4): normal          Left dorsal foot (L-5): normal          Left lateral foot (S-1): normal          Right medial foot (L-4): normal          Right dorsal foot (L-5): normal          Right  lateral foot (S-1): normal       Sensory-Monofilament:          Left foot: normal          Right foot: normal       Inspection:          Left foot: normal          Right foot: normal  Nails:          Left foot: normal          Right foot: normal   Impression & Recommendations:  Problem # 1:  DIARRHEA (ICD-787.91) DDx includes ibs, bile-salt diarrhea, secretory diarrhea, autonomic dysfunction 2/2 DM.  Patient has had extensive lab testing - all negative.  Diarrhea is no better if patient withholds dairy products or metformin.  Pt had cholescystectomy in 2008 and is unsure if had diarrhea prior to this.  Today will try cholestyramine 1 scoop daily and see patient back in one month.  Will continue metformin (see #2) as not cause of diarrhea.    Problem # 2:  DIABETES MELLITUS, TYPE II (ICD-250.00) Patient with slight improvement HgbA1c today 9.7 from 10.3.  Patient unable to afford strips.  Counseled of imporatnce of improved glucose control and patient understands.  Would like patient to be able to check blood sugar once daily and restart insulin (perhaps NPH 70/30 because cheaper than lantus and patient does not object to more frequent dosing), however patient states she cannot at this time afford strips for daily CBG monitoring.  Will restart glipizide and continue metformin, however with this oral regimen, the patient will not acheive goal HgbA1c.  Will need at subsequent visits to check on patient's financial abilities to afford monitor strips and restart insulin therapy then.  Foot exam normal today (including monofilament), though patient with some tingling in little toes starting - would expect neuropathy to start in big toe first, however will need to continue to montor for neuropathy.  Paitent is in queue to schedule her yearly diabetic eye exam.  Patient is fasting today so we will check FLP.  BP excellent today.  The following medications were removed from the medication list:     Lantus 100 Unit/ml Soln (Insulin glargine) ..... Inject 15 units under skin as instructed at bedtime. Her updated medication list for this problem includes:    Lisinopril-hydrochlorothiazide 20-25 Mg Tabs (Lisinopril-hydrochlorothiazide) .Marland Kitchen... Take 1 tablet by mouth once a day    Glipizide 10 Mg Tabs (Glipizide) .Marland Kitchen... Take 1 tablet by mouth two times a day    Metformin Hcl 1000 Mg Tabs (Metformin hcl) .Marland Kitchen... Take 1 tablet by mouth two times a day  Orders: T-Hgb A1C (in-house) ([id number]) T-Lipid Profile ([id number])  Problem # 3:  ESSENTIAL HYPERTENSION, BENIGN (ICD-401.1) BP excellent today.  No dizziness/lightheadedness/falls or headaches per patient.  Continue meds at current dose.    BMP today showed hypokalemia in setting of chronic diarrhea and HCTZ use in patient with normal renal function - see #5.  Her updated medication list for this problem includes:    Lisinopril-hydrochlorothiazide 20-25 Mg Tabs (Lisinopril-hydrochlorothiazide) .Marland Kitchen... Take 1 tablet by mouth once a day  Orders: T-Basic Metabolic Panel [id number])  Problem # 4:  OBESITY (ICD-278.00) Weight improved.  Patient continues to make poor dietary choices (fatty foods, etc), but has decreased portion size, so this is a start.  Emphasized goal of weight loss of 1 pound per week and congratulated patient on recent weight loss.  Problem # 5:  HYPOKALEMIA (ICD-276.8) BMP today showed hypokalemia in setting of chronic diarrhea and HCTZ use in patient with normal renal function.  Will call patient to inform of need to take low-dose oral KCl supplementation.  At next visit will need repeat BMP and to reassess to see if diarrhea has improved.  Faxed to pharmacy Rx for KCl daily.  Complete Medication List: 1)  Lisinopril-hydrochlorothiazide 20-25  Mg Tabs (Lisinopril-hydrochlorothiazide) .... Take 1 tablet by mouth once a day 2)  Glipizide 10 Mg Tabs (Glipizide) .... Take 1 tablet by mouth two times a day 3)  Truetest  Test Strp (Glucose blood) .... To test blood sugar once daily 4)  Lancets Misc (Lancets) .... To test blood sugar once daily 5)  Bd Insulin Syringe Ultrafine 31g X 5/16" 1 Ml Misc (Insulin syringe-needle u-100) .... Use as directed. 6)  Metformin Hcl 1000 Mg Tabs (Metformin hcl) .... Take 1 tablet by mouth two times a day 7)  Cholestyramine Powd (Cholestyramine) .... Dissolve 1 scoop (4grams) of powder in juice or water and drink every morning 8)  Potassium Chloride 20 Meq Pack (Potassium chloride) .... Take by mouth daily  Patient Instructions: 1)  Please take your medicines as directed.   2)  Follow-up in the clinic in one month. Prescriptions: POTASSIUM CHLORIDE 20 MEQ PACK (POTASSIUM CHLORIDE) Take by mouth daily  #30 x 1   Entered and Authorized by:   Danelle Berry, MD   Signed by:   Danelle Berry, MD on 08/06/2010   Method used:   Electronically to        [facility] (retail)       [address]       [place], [place]  [address]       Ph: [phone]       Fax: [phone]   RxID:   [id number] CHOLESTYRAMINE  POWD (CHOLESTYRAMINE) Dissolve 1 scoop (4grams) of powder in juice or water and drink every morning  #120g x 2   Entered and Authorized by:   Danelle Berry, MD   Signed by:   Danelle Berry, MD on 08/06/2010   Method used:   Electronically to        [facility] (retail)       [address]       [place], [place]  [address]       Ph: [phone]       Fax: [phone]   RxID:   [id number] GLIPIZIDE 10 MG TABS (GLIPIZIDE) Take 1 tablet by mouth two times a day  #60 x 5   Entered and Authorized by:   Danelle Berry, MD   Signed by:   Danelle Berry, MD on 08/06/2010   Method used:   Electronically to        [facility] (retail)       [address]       [place], [place]  [address]       Ph: [phone]       Fax: [phone]   RxID:    [id number]  Process Orders Check Orders Results:     [facility]: ABN not required for this insurance Tests Sent for requisitioning (August 06, 2010 11:20 AM):     08/06/2010: [facility] -- T-Basic Metabolic Panel [phone] (signed)     08/06/2010: [facility] -- T-Lipid Profile [phone] (signed)     Prevention & Chronic Care Immunizations   Influenza vaccine: Fluvax Non-MCR  (01/04/2010)    Tetanus booster: 01/04/2010: Tdap    Pneumococcal vaccine: Not documented  Other Screening   Pap smear: Normal  (07/08/2006)   Pap smear action/deferral: Deferred  (12/04/2009)    Mammogram: Not documented   Mammogram action/deferral:  Deferred to age 32  (12/04/2009)   Smoking status: quit  (08/06/2010)  Diabetes Mellitus   HgbA1C: 9.7  (08/06/2010)    Eye exam: No diabetic retinopathy.     (04/18/2009)   Diabetic eye exam action/deferral: Ophthalmology referral  (06/06/2010)   Eye exam due: 04/2010    Foot exam: yes  (08/06/2010)   High risk foot: Not documented   Foot care education: Not documented    Urine microalbumin/creatinine ratio: 17.4  (09/20/2008)   Urine microalbumin action/deferral: Not indicated    Diabetes flowsheet reviewed?: Yes   Progress toward A1C goal: Improved  Lipids   Total Cholesterol: 178  (07/20/2008)   Lipid panel action/deferral: Lipid Panel ordered   LDL: 87  (07/20/2008)   LDL Direct: Not documented   HDL: 45  (07/20/2008)   Triglycerides: 231  (07/20/2008)  Hypertension   Last Blood Pressure: 111 / 80  (08/06/2010)   Serum creatinine: 0.60  (05/16/2010)   Serum potassium 4.1  (05/16/2010)    Hypertension flowsheet reviewed?: Yes   Progress toward BP goal: At goal  Self-Management Support :   Personal Goals (by the next clinic visit) :     Personal A1C goal: 7  (12/04/2009)     Personal blood pressure goal: 140/90  (12/04/2009)     Personal LDL goal: 130   (12/04/2009)    Patient will work on the following items until the next clinic visit to reach self-care goals:     Medications and monitoring: take my medicines every day, examine my feet every day  (08/06/2010)     Eating: drink diet soda or water instead of juice or soda, eat more vegetables, eat foods that are low in salt, limit or avoid alcohol  (08/06/2010)     Activity: take a 30 minute walk every day  (12/04/2009)     Other: eating less but still eating fried foods - only exercise is standing up at work  (08/06/2010)    Diabetes self-management support: Pre-printed educational material, Resources for patients handout, Written self-care plan  (08/06/2010)   Diabetes care plan printed   Last diabetes self-management training by diabetes educator: 06/06/2010    Hypertension self-management support: Pre-printed educational material, Resources for patients handout, Written self-care plan  (08/06/2010)   Hypertension self-care plan printed.      Resource handout printed.    Laboratory Results   Blood Tests   Date/Time Received: August 06, 2010 10:04 AM  Date/Time Reported: Burke Keels  August 06, 2010 10:04 AM   HGBA1C: 9.7%   (Normal Range: Non-Diabetic - 3-6%   Control Diabetic - 6-8%)

## 2011-01-29 NOTE — Assessment & Plan Note (Signed)
Summary: RA/NEEDS 1 MONTH F/U VISIT/CH   Vital Signs:  Patient profile:   45 year old female Height:      65.5 inches Weight:      247.6 pounds BMI:     40.72 Temp:     97.6 degrees F oral Pulse rate:   66 / minute BP sitting:   125 / 84  (right arm)  Vitals Entered By: Filomena Jungling NT II (September 18, 2010 2:09 PM) CC: follow-up vist ? powder, , Depression Is Patient Diabetic? Yes Did you bring your meter with you today? No Nutritional Status BMI of > 30 = obese  Have you ever been in a relationship where you felt threatened, hurt or afraid?No   Does patient need assistance? Functional Status Self care Ambulation Normal   Visit Type:  ofc visit Primary Care Provider:  Jaci Lazier MD  CC:  follow-up vist ? powder, , and Depression.  History of Present Illness: 45 y/o woman with pmh outlined below here on f/u of her diarrhea. States that Cholestyramine works great for her and has significantly reduced the number of bms, however states that its too expensive and would like to try a cheaper alternative.  Also states that she'd like to restart Prozac as she has been feeling more depressed lately (she stopped taking it about a year ago).  Complains that she is not able to afford all her medications and would like to get a flu shot today.      Depression History:      The patient denies a depressed mood most of the day but notes a diminished interest in her usual daily activities.              Preventive Screening-Counseling & Management  Alcohol-Tobacco     Alcohol type: BEER / ONLY ON THE WEEKEND     Smoking Status: quit     Year Quit: 1990'S  Caffeine-Diet-Exercise     Does Patient Exercise: yes     Type of exercise: WALKING     Times/week: 2-3  Current Medications (verified): 1)  Lisinopril-Hydrochlorothiazide 20-25 Mg Tabs (Lisinopril-Hydrochlorothiazide) .... Take 1 Tablet By Mouth Once A Day 2)  Glipizide 10 Mg Tabs (Glipizide) .... Take 1 Tablet By Mouth  Two Times A Day 3)  Truetest Test   Strp (Glucose Blood) .... To Test Blood Sugar Once Daily 4)  Lancets   Misc (Lancets) .... To Test Blood Sugar Once Daily 5)  Bd Insulin Syringe Ultrafine 31g X 5/16" 1 Ml Misc (Insulin Syringe-Needle U-100) .... Use As Directed. 6)  Metformin Hcl 1000 Mg Tabs (Metformin Hcl) .... Take 1 Tablet By Mouth Two Times A Day 7)  Cholestyramine  Powd (Cholestyramine) .... Dissolve 1 Scoop (4grams) of Powder in Juice or Water and Drink Every Morning 8)  Potassium Chloride 20 Meq Pack (Potassium Chloride) .... Take By Mouth Daily 9)  Welchol 3.75 Gm Pack (Colesevelam Hcl) .... Take By Mouth Once Daily 10)  Fluoxetine Hcl 20 Mg Caps (Fluoxetine Hcl) .... Take By Mouth Daily  Allergies (verified): No Known Drug Allergies  Past History:  Past Medical History: Last updated: 08/06/2010 Bronchitis Obesity Renal calculi, hx of  Obstructive sleep apnea Carpal tunnel syndrome Herpes simples type I- vaginal Diarrhea Menorrhagia Depression Diabetes mellitus, type II GERD  Physical Exam  General:  alert, appropriate dress, and normal appearance.   Head:  normocephalic.   Eyes:  anicteric, wearing glasses Ears:  R ear normal and L ear normal.  Nose:  no external erythema and no nasal discharge.   Mouth:  pharynx pink and moist, poor dentition, and teeth missing.   Lungs:  normal respiratory effort, no accessory muscle use, normal breath sounds, no crackles, and no wheezes.   Heart:  normal rate, regular rhythm, and no murmur.   Abdomen:  obese, abdomen soft and non-tender without masses, organomegaly or hernias noted. Msk:  normal ROM and no joint tenderness.   Pulses:  2+ DP/PT pulses bilaterally Extremities:  no edema Neurologic:  alert & oriented X3.  CN grossly intact.  gait normal  Diabetes Management Exam:    Eye Exam:       Eye Exam done elsewhere          Date: 07/2010          Results: normal          Done by: outside  opthalmologist   Impression & Recommendations:  Problem # 1:  DIARRHEA (ICD-787.91) Markedly improved on Cholestyramine ->which is her best option given she has what appears to be secretory diarrhea. Encouraged her to take her prescription to the [facility] where she may be able to fill it for much cheaper. Added Welchol, also a bile acid sequesterant to her meds, this may be a cheaper option for her - and will also help with her DM and HL.  Problem # 2:  HYPOKALEMIA (ICD-276.8) Likely 2/2 GI losses from diarrhea. Will f/u her BMP. In the meantime, she's to continue her oral K supplements.  Orders: T-Basic Metabolic Panel [id number])  Problem # 3:  DEPRESSION (ICD-311) States she's losing interest in activities for most of the day and is now sleeping too much. Will start her back on Prozac which seemed to work well for her in the past.  Her updated medication list for this problem includes:    Fluoxetine Hcl 20 Mg Caps (Fluoxetine hcl) .Marland Kitchen... Take by mouth daily  Problem # 4:  DIABETES MELLITUS, TYPE II (ICD-250.00) Foot exam and eye exam wnl. Last A1C 9.7 in August. States she occassionaly runs out of funds for her meds.  Again, she was encouraged to try the health dept as she hasn't gone there to fill any of her scripts in the past.  Her updated medication list for this problem includes:    Lisinopril-hydrochlorothiazide 20-25 Mg Tabs (Lisinopril-hydrochlorothiazide) .Marland Kitchen... Take 1 tablet by mouth once a day    Glipizide 10 Mg Tabs (Glipizide) .Marland Kitchen... Take 1 tablet by mouth two times a day    Metformin Hcl 1000 Mg Tabs (Metformin hcl) .Marland Kitchen... Take 1 tablet by mouth two times a day  Problem # 5:  ESSENTIAL HYPERTENSION, BENIGN (ICD-401.1) Excellent blood pressure today. Cont her current regimen.  Her updated medication list for this problem includes:    Lisinopril-hydrochlorothiazide 20-25 Mg Tabs (Lisinopril-hydrochlorothiazide) .Marland Kitchen... Take 1 tablet by mouth once a day  Medications  Added to Medication List This Visit: 1)  Welchol 3.75 Gm Pack (Colesevelam hcl) .... Take by mouth once daily 2)  Fluoxetine Hcl 20 Mg Caps (Fluoxetine hcl) .... Take by mouth daily  Complete Medication List: 1)  Lisinopril-hydrochlorothiazide 20-25 Mg Tabs (Lisinopril-hydrochlorothiazide) .... Take 1 tablet by mouth once a day 2)  Glipizide 10 Mg Tabs (Glipizide) .... Take 1 tablet by mouth two times a day 3)  Truetest Test Strp (Glucose blood) .... To test blood sugar once daily 4)  Lancets Misc (Lancets) .... To test blood sugar once daily 5)  Bd Insulin Syringe Ultrafine 31g  X 5/16" 1 Ml Misc (Insulin syringe-needle u-100) .... Use as directed. 6)  Metformin Hcl 1000 Mg Tabs (Metformin hcl) .... Take 1 tablet by mouth two times a day 7)  Cholestyramine Powd (Cholestyramine) .... Dissolve 1 scoop (4grams) of powder in juice or water and drink every morning 8)  Potassium Chloride 20 Meq Pack (Potassium chloride) .... Take by mouth daily 9)  Welchol 3.75 Gm Pack (Colesevelam hcl) .... Take by mouth once daily 10)  Fluoxetine Hcl 20 Mg Caps (Fluoxetine hcl) .... Take by mouth daily  Depression Assessment/Plan: Treatment Comments:  to be restarted today  Patient Instructions: 1)  Pls take your prescriptions to the health department if you cannot afford them. 2)  Call the clinic if you have any other problems. 3)  Return to clinic in 6 weeks. Prescriptions: CHOLESTYRAMINE  POWD (CHOLESTYRAMINE) Dissolve 1 scoop (4grams) of powder in juice or water and drink every morning  #120g x 2   Entered and Authorized by:   Jaci Lazier MD   Signed by:   Jaci Lazier MD on 09/18/2010   Method used:   Print then Give to Patient   RxID:   [id number] FLUOXETINE HCL 20 MG CAPS (FLUOXETINE HCL) take by mouth daily  #60 x 3   Entered and Authorized by:   Jaci Lazier MD   Signed by:   Jaci Lazier MD on 09/18/2010   Method used:   Print then Give to Patient   RxID:    [id number] WELCHOL 3.75 GM PACK (COLESEVELAM HCL) take by mouth once daily  #3 x 3   Entered and Authorized by:   Jaci Lazier MD   Signed by:   Jaci Lazier MD on 09/18/2010   Method used:   Print then Give to Patient   RxID:   [id number]   Process Orders Check Orders Results:     [facility]: ABN not required for this insurance Tests Sent for requisitioning (October 03, 2010 4:03 PM):     09/18/2010: [facility] -- T-Basic Metabolic Panel [phone] (signed)

## 2011-01-29 NOTE — Assessment & Plan Note (Signed)
Summary: DM TEACHING/CH   Allergies: No Known Drug Allergies   Complete Medication List: 1)  Lisinopril-hydrochlorothiazide 20-25 Mg Tabs (Lisinopril-hydrochlorothiazide) .... Take 1 tablet by mouth once a day 2)  Glipizide 10 Mg Tabs (Glipizide) .... Take 1 tablet by mouth two times a day 3)  Truetest Test Strp (Glucose blood) .... To test blood sugar once daily 4)  Lancets Misc (Lancets) .... To test blood sugar once daily 5)  Indomethacin 25 Mg Caps (Indomethacin) .Marland Kitchen.. 1-2 tab as required for pain up to three times a day with meal 6)  Lantus 100 Unit/ml Soln (Insulin glargine) .... Inject 15 units under skin as instructed at bedtime. 7)  Bd Insulin Syringe Ultrafine 31g X 5/16" 1 Ml Misc (Insulin syringe-needle u-100) .... Use as directed. 8)  Flexeril 5 Mg Tabs (Cyclobenzaprine hcl) .... Take 1 tablet twice a day as needed for muscle pain. 9)  Metformin Hcl 1000 Mg Tabs (Metformin hcl) .... Take 1/2 tablet by mouth two times a day, may increase to 1 tablet two times a day if no side effects after 2 weeks  Other Orders: DSMT(Medicare) Individual, 30 Minutes ([id number])  Diabetes Self Management Training  PCP: Joaquin Courts  MD Date diagnosed with diabetes: 12/30/2002 Diabetes Type: Type 2 insulin Other persons present: no Current smoking Status: quit  Diabetes Medications:  Lipid lowering Meds? No Anti-platelet Meds? No Herbs or Supplements: No Comments: drew up 15 units saliner and sel finjected wihtout difficutly, able to code new True track meter rproided today- [facility] told me that they were probably going to be getting more strips in for this meter and would keep patient in mind. Taught patient to self titrate per physician order 1 unit each night until fasting blood sugar 901-30 range and to call when stopped increasing Lantus to tell us new dose.  Long Acting  Insulin Type:Lantus  Bedtime Dose: 15 units    Monitoring Self monitoring blood glucose 1  time a day Name of Meter  True Track  Time of Testing  Before Breakfast  Recent Episodes of: Requiring Help from another person  Hyperglycemia : Yes     Diabetes Disease Process  Discussed today  Medications State name-action-dose-duration-side effects-and time to take medication: Demonstrates competency   State appropriate timing of food related to medication: Demonstrates competency   Demonstrates/verbalizes site selection and rotation for injections Demonstrates competency   Correctly draw up and administer insulin-Byetta-Symlin-glucagon: Demonstrates competency   State insulin adjustment guidelines: Demonstrates competency   Describe safe needle/lancet disposal: Psychiatrist    Nutritional Management  Monitoring State purpose and frequency of monitoring BG-ketones-HgbA1C  : Demonstrates competency   Perform glucose monitoring/ketone testing and record results correctly: Demonstrates competency    State target blood glucose and HgbA1C goals: Demonstrates competency    Complications State the causes-signs and symptoms and prevention of Hyperglycemia: Demonstrates competency   Explain proper treatment of hyperglycemia: Demonstrates competency   State the causes- signs and symptoms and prevention of hypoglycemia: Demonstrates competencyExplain proper treatment of hypoglycemia: Needs review/assistance Exercise  Lifestyle changes:Goal setting and Problem solving Identify Family/SO role in managing diabetes: Demonstrates competency Psychosocial Adjustment Name two ways of obtaining support from family/friends: Demonstrates competencyDiabetes Management Education Done: 06/06/2010    BEHAVIORAL GOALS INITIAL Utilizing medications if for therapeutic effectiveness: stat insulin and record self titration on sheet provided        Metamucil Fiber supplements provided today to increase soluble fiber in diet. Diabetes Self Management Support: family- sister with  diabetes, clinic visits/staff Follow up on goal by phone in 2-3 weeks

## 2011-01-29 NOTE — Progress Notes (Signed)
Summary: Soc. Work  Programme researcher, broadcasting/film/video of Call: Called patient and she will come in tomorrow Nov. 9th at 10 AM for problem-solving.   Patient was no show today for her appmt with social work.

## 2011-01-29 NOTE — Assessment & Plan Note (Signed)
Summary: EST-6 WEEK RECHECK/CH   Vital Signs:  Patient profile:   45 year old female Height:      65.5 inches Weight:      249.0 pounds BMI:     40.95 Temp:     98.3 degrees F oral Pulse rate:   70 / minute BP sitting:   141 / 84  (right arm)  Vitals Entered By: Filomena Jungling NT II (October 30, 2010 3:27 PM) CC: checkup, Depression Is Patient Diabetic? Yes Did you bring your meter with you today? No Pain Assessment Patient in pain? no      Nutritional Status BMI of > 30 = obese CBG Result 201  Have you ever been in a relationship where you felt threatened, hurt or afraid?No   Does patient need assistance? Functional Status Self care Ambulation Normal   Primary Care Piers Baade:  Jaci Lazier MD  CC:  checkup and Depression.  History of Present Illness: Amy Norris is here in the clinic for her 6 week followup. No acute issues, however, still having difficulty paying for her meds even though she does have an orange card. She has not taken any of her meds in the past 2 wks. She states her diarrhea is resolving and takes an OTC med if she's having any worsening symptoms, otherwise, she has no complaints today. She does check her blood pressure and blood glucose levels occassionally at home.  Depression History:      The patient denies a depressed mood most of the day and a diminished interest in her usual daily activities.         Depression Treatment History (Reviewed):  Prior Medication Used:   Start Date: Assessment of Effect:   Comments:  Prozac (fluoxetine)     --     some improvement     --   Preventive Screening-Counseling & Management  Alcohol-Tobacco     Alcohol type: BEER / ONLY ON THE WEEKEND     Smoking Status: quit     Year Quit: 1990'S  Caffeine-Diet-Exercise     Does Patient Exercise: yes     Type of exercise: WALKING     Times/week: 2-3  Problems Prior to Update: 1)  Hypokalemia  (ICD-276.8) 2)  Shoulder Pain, Right  (ICD-719.41) 3)  Uri   (ICD-465.9) 4)  Pruritus  (ICD-698.9) 5)  Unspecified Urticaria  (ICD-708.9) 6)  Epicondylitis, Left  (ICD-726.32) 7)  Diarrhea  (ICD-787.91) 8)  Shoulder Pain, Left  (ICD-719.41) 9)  Foot Pain, Left  (ICD-729.5) 10)  Anal Fissure  (ICD-565.0) 11)  Orthostatic Dizziness  (ICD-780.4) 12)  Vaginal Pruritus  (ICD-698.1) 13)  Vaginal Discharge  (ICD-623.5) 14)  Essential Hypertension, Benign  (ICD-401.1) 15)  Breast Mass  (ICD-611.72) 16)  Cholelithiasis  (ICD-574.20) 17)  Gerd  (ICD-530.81) 18)  Diabetes Mellitus, Type II  (ICD-250.00) 19)  Depression  (ICD-311) 20)  Polycystic Ovarian Disease  (ICD-256.4) 21)  Carpal Tunnel Syndrome  (ICD-354.0) 22)  Renal Calculus, Hx of  (ICD-V13.01) 23)  Sleep Apnea, Obstructive  (ICD-327.23) 24)  Obesity  (ICD-278.00) 25)  Herpes Simplex Infection, 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 Take 1 Tablet By Mouth Once A Day 2)  Glipizide 10 Mg Tabs (Glipizide) .... Take 1 Tablet By Mouth Two Times A Day 3)  Truetest Test   Strp (Glucose Blood) .... To Test Blood Sugar Once Daily 4)  Lancets   Misc (Lancets) .... To Test Blood  Sugar Once Daily 5)  Bd Insulin Syringe Ultrafine 31g X 5/16" 1 Ml Misc (Insulin Syringe-Needle U-100) .... Use As Directed. 6)  Metformin Hcl 1000 Mg Tabs (Metformin Hcl) .... Take 1 Tablet By Mouth Two Times A Day 7)  Cholestyramine  Powd (Cholestyramine) .... Dissolve 1 Scoop (4grams) of Powder in Juice or Water and Drink Every Morning 8)  Potassium Chloride 20 Meq Pack (Potassium Chloride) .... Take By Mouth Daily 9)  Welchol 3.75 Gm Pack (Colesevelam Hcl) .... Take By Mouth Once Daily 10)  Fluoxetine Hcl 20 Mg Caps (Fluoxetine Hcl) .... Take By Mouth Daily  Allergies (verified): No Known Drug Allergies  Review of Systems  The patient denies fever, weight loss, and chest pain.    Physical Exam  General:   alert, appropriate dress, and normal appearance.   Mouth:  pharynx pink and moist, poor dentition, and front teeth missing.   Lungs:  normal respiratory effort, no accessory muscle use, normal breath sounds, no crackles, and no wheezes.   Heart:  normal rate, regular rhythm, and no murmur.   Abdomen:  obese, abdomen soft and non-tender without masses, organomegaly or hernias noted. Psych:  Oriented X3, normally interactive, and good eye contact.  not anxious appearing and not depressed appearing.     Impression & Recommendations:  Problem # 1:  INADEQUATE MATERIAL RESOURCES (ICD-V60.2) Unable to afford any of her meds even at the health department. WIll have her see [name] with hopes that she can help figure out an alternative means of getting cheaper meds.  Orders: Social Work Referral (Social )  Problem # 2:  DIARRHEA (ICD-787.91) Assessment: Improved Resolving. Unable to afford any of her meds as presribed during her last visit. She does take an OTC med whenever this worsens, however states that it is getting much better.  Problem # 3:  ESSENTIAL HYPERTENSION, BENIGN (ICD-401.1) Assessment: Deteriorated BP not at goal today in the setting of 2 weeks of not taking her ACEI. WIll follow. Her updated medication list for this problem includes:    Lisinopril-hydrochlorothiazide 20-25 Mg Tabs (Lisinopril-hydrochlorothiazide) .Marland Kitchen... Take 1 tablet by mouth once a day  BP today: 141/84 Prior BP: 125/84 (09/18/2010)  Labs Reviewed: K+: 4.0 (09/18/2010) Creat: : 0.61 (09/18/2010)   Chol: 186 (08/06/2010)   HDL: 42 (08/06/2010)   LDL: 83 (08/06/2010)   TG: 306 (08/06/2010)  Problem # 4:  DIABETES MELLITUS, TYPE II (ICD-250.00) Assessment: Improved A1C of 6.9 today, markedly improved. Cont current meds once she can afford them. Will review chat to see when her last eye exam was. Got a foot exam at last office visit 6 weeks ago.  Her updated medication list for this problem includes:     Lisinopril-hydrochlorothiazide 20-25 Mg Tabs (Lisinopril-hydrochlorothiazide) .Marland Kitchen... Take 1 tablet by mouth once a day    Glipizide 10 Mg Tabs (Glipizide) .Marland Kitchen... Take 1 tablet by mouth two times a day    Metformin Hcl 1000 Mg Tabs (Metformin hcl) .Marland Kitchen... Take 1 tablet by mouth two times a day  Orders: T- Capillary Blood Glucose (04540) T-Hgb A1C (in-house) ([id number])  Labs Reviewed: Creat: 0.61 (09/18/2010)     Last Eye Exam: No diabetic retinopathy.    (04/18/2009) Reviewed HgBA1c results: 6.9 (10/30/2010)  9.7 (08/06/2010)  Problem # 5:  DEPRESSION (ICD-311) Positive affect today. On SSRI for now, hasn't taken it in 2 weeks.  Her updated medication list for this problem includes:    Fluoxetine Hcl 20 Mg Caps (Fluoxetine hcl) .Marland Kitchen... Take by mouth  daily  Complete Medication List: 1)  Lisinopril-hydrochlorothiazide 20-25 Mg Tabs (Lisinopril-hydrochlorothiazide) .... Take 1 tablet by mouth once a day 2)  Glipizide 10 Mg Tabs (Glipizide) .... Take 1 tablet by mouth two times a day 3)  Truetest Test Strp (Glucose blood) .... To test blood sugar once daily 4)  Lancets Misc (Lancets) .... To test blood sugar once daily 5)  Bd Insulin Syringe Ultrafine 31g X 5/16" 1 Ml Misc (Insulin syringe-needle u-100) .... Use as directed. 6)  Metformin Hcl 1000 Mg Tabs (Metformin hcl) .... Take 1 tablet by mouth two times a day 7)  Cholestyramine Powd (Cholestyramine) .... Dissolve 1 scoop (4grams) of powder in juice or water and drink every morning 8)  Potassium Chloride 20 Meq Pack (Potassium chloride) .... Take by mouth daily 9)  Welchol 3.75 Gm Pack (Colesevelam hcl) .... Take by mouth once daily 10)  Fluoxetine Hcl 20 Mg Caps (Fluoxetine hcl) .... Take by mouth daily  Other Orders: Admin 1st Vaccine (48546) Flu Vaccine 67yrs + [phone])   Patient Instructions: 1)  Pls make sure to see [name], our social work for discussion of your financial issues. 2)  Call the clinic if you have any other  problems or concerns. 3)  RTC in 3months.   Orders Added: 1)  T- Capillary Blood Glucose [82948] 2)  T-Hgb A1C (in-house) [00938HW] 3)  Social Work Referral [Social ] 4)  Admin 1st Vaccine [90471] 5)  Flu Vaccine 27yrs + [90658] 6)  Est. Patient Level III [29937]    Prevention & Chronic Care Immunizations   Influenza vaccine: Fluvax 3+  (10/30/2010)    Tetanus booster: 01/04/2010: Tdap    Pneumococcal vaccine: Not documented  Other Screening   Pap smear: Normal  (07/08/2006)   Pap smear action/deferral: Deferred  (12/04/2009)    Mammogram: Not documented   Mammogram action/deferral: Deferred to age 80  (12/04/2009)  Reports requested:  Smoking status: quit  (10/30/2010)  Diabetes Mellitus   HgbA1C: 6.9  (10/30/2010)   Hemoglobin A1C due: 01/30/2011    Eye exam: No diabetic retinopathy.     (04/18/2009)   Last eye exam report requested.   Diabetic eye exam action/deferral: Ophthalmology referral  (06/06/2010)   Eye exam due: 04/2010    Foot exam: yes  (08/06/2010)   High risk foot: Not documented   Foot care education: Not documented   Foot exam due: 08/07/2011    Urine microalbumin/creatinine ratio: 17.4  (09/20/2008)   Urine microalbumin action/deferral: Not indicated    Diabetes flowsheet reviewed?: Yes   Progress toward A1C goal: Improved  Lipids   Total Cholesterol: 186  (08/06/2010)   Lipid panel action/deferral: Lipid Panel ordered   LDL: 83  (08/06/2010)   LDL Direct: Not documented   HDL: 42  (08/06/2010)   Triglycerides: 306  (08/06/2010)   Lipid panel due: 09/06/2010  Hypertension   Last Blood Pressure: 141 / 84  (10/30/2010)   Serum creatinine: 0.61  (09/18/2010)   Serum potassium 4.0  (09/18/2010)    Hypertension flowsheet reviewed?: Yes   Progress toward BP goal: Deteriorated  Self-Management Support :   Personal Goals (by the next clinic visit) :     Personal A1C goal: 7  (12/04/2009)     Personal blood pressure goal: 140/90   (12/04/2009)     Personal LDL goal: 130  (12/04/2009)    Patient will work on the following items until the next clinic visit to reach self-care goals:     Medications  and monitoring: take my medicines every day, check my blood sugar, examine my feet every day  (10/30/2010)     Eating: drink diet soda or water instead of juice or soda, eat more vegetables, use fresh or frozen vegetables, eat foods that are low in salt, eat baked foods instead of fried foods, eat fruit for snacks and desserts  (10/30/2010)     Activity: take a 30 minute walk every day  (10/30/2010)     Other: eating less but still eating fried foods - only exercise is standing up at work  (08/06/2010)    Diabetes self-management support: Education handout, Resources for patients handout  (10/30/2010)   Diabetes education handout printed   Last diabetes self-management training by diabetes educator: 06/06/2010    Hypertension self-management support: Education handout, Resources for patients handout  (10/30/2010)   Hypertension education handout printed      Resource handout printed.   Nursing Instructions: Request report of last diabetic eye exam    Laboratory Results   Blood Tests   Date/Time Received: October 30, 2010 3:43 PM  Date/Time Reported: Burke Keels  October 30, 2010 3:43 PM   HGBA1C: 6.9%   (Normal Range: Non-Diabetic - 3-6%   Control Diabetic - 6-8%) CBG Random:: 201mg /dL    Flu Vaccine Consent Questions     Do you have a history of severe allergic reactions to this vaccine? no    Any prior history of allergic reactions to egg and/or gelatin? no    Do you have a sensitivity to the preservative Thimersol? no    Do you have a past history of Guillan-Barre Syndrome? no    Do you currently have an acute febrile illness? no    Have you ever had a severe reaction to latex? no    Vaccine information given and explained to patient? yes    Are you currently pregnant? no    Lot  Number:[id number]   Exp Date:06/29/2011   Manufacturer: [employer]    Site Given  Left Deltoid IM.[facility])  October 30, 2010 4:13 PM  Blood Tests   Date/Time Received: October 30, 2010 3:43 PM  Date/Time Reported: Burke Keels  October 30, 2010 3:43 PM   HGBA1C: 6.9%   (Normal Range: Non-Diabetic - 3-6%   Control Diabetic - 6-8%) CBG Random:: 201mg /dL    .opcflu

## 2011-01-29 NOTE — Progress Notes (Signed)
Summary: med refill/gp  Phone Note Refill Request Message from:  Fax from Pharmacy on May 07, 2010 4:27 PM  Refills Requested: Medication #1:  INDOMETHACIN 25 MG CAPS 1-2 tab as required for pain up to three times a day with meal   Last Refilled: 01/03/2010  Method Requested: Electronic Initial call taken by: Chinita Pester RN,  May 07, 2010 4:27 PM    Prescriptions: INDOMETHACIN 25 MG CAPS (INDOMETHACIN) 1-2 tab as required for pain up to three times a day with meal  #25 x 0   Entered and Authorized by:   Joaquin Courts  MD   Signed by:   Joaquin Courts  MD on 05/08/2010   Method used:   Electronically to        [facility] (retail)       [address]       [place], [place]  [address]       Ph: [phone]       Fax: [phone]   RxID:   [id number]

## 2011-01-29 NOTE — Miscellaneous (Signed)
Summary: Diabetic eye exam  Clinical Lists Changes  Observations: Added new observation of [id number]: 08/2011 (11/02/2010 9:45) Added new observation of DIAB EYE EX: Normal diabetic eye exam (09/13/2010 9:45)      Diabetic Eye Exam  Procedure date:  09/13/2010  Findings:      Normal diabetic eye exam  Procedures Next Due Date:    Diabetic Eye Exam: 08/2011

## 2011-01-29 NOTE — Progress Notes (Signed)
Summary: med refill/gp  Phone Note Refill Request Message from:  Fax from Pharmacy on January 03, 2010 2:08 PM  Refills Requested: Medication #1:  DIPHENHYDRAMINE HCL 50 MG TABS four times a day for next 5 days. Then twice a day.Do not drive while taking this medication..   Last Refilled: 12/04/2009  Method Requested: Electronic Initial call taken by: Chinita Pester RN,  January 03, 2010 2:08 PM  Follow-up for Phone Call        Rx denied because she can get this over the counter and does not need prescription. Follow-up by: Joaquin Courts  MD,  January 03, 2010 2:19 PM  Additional Follow-up for Phone Call Additional follow up Details #1::        Pharmacy made awared. Additional Follow-up by: Chinita Pester RN,  January 03, 2010 4:33 PM

## 2011-01-29 NOTE — Progress Notes (Signed)
  Phone Note Outgoing Call   Call placed by: Theotis Barrio NT II,  September 10, 2010 1:52 PM Call placed to: Patient Details for Reason: [facility]  Summary of Call: CALLED AND LEF VOICE MESSAGE FOR PATIENT THAT THE [facility] APPT FOR SEPT 14, 011 HAS BEEN CHAGNED TO THE SEPT 15, 00 APPT STILLE 9:15AM

## 2011-01-29 NOTE — Consult Note (Signed)
Summary: Amy Norris  Amy Norris   Imported By: Louretta Parma 10/31/2010 15:19:48  _____________________________________________________________________  External Attachment:    Type:   Image     Comment:   External Document  Appended Document: Amy Norris Reviewed, eye exam.

## 2011-01-29 NOTE — Assessment & Plan Note (Signed)
Summary: Amy Norris 2 WEEK RECHECK/CH   Vital Signs:  Patient profile:   45 year old female Height:      65.5 inches (166.37 cm) Weight:      257.1 pounds (116.41 kg) BMI:     42.12 Temp:     98.6 degrees F (37.00 degrees C) oral Pulse rate:   71 / minute BP sitting:   145 / 94  (right arm) Cuff size:   large  Vitals Entered By: Theotis Barrio NT II (June 06, 2010 9:27 AM) CC: ROUTINE OFFICE VISIT / LAB RESULTS  / DM   /  PATIENT IS FASTING Is Patient Diabetic? Yes Did you bring your meter with you today? No Pain Assessment Patient in pain? no      Nutritional Status BMI of > 30 = obese  Have you ever been in a relationship where you felt threatened, hurt or afraid?No   Does patient need assistance? Functional Status Self care Comments ROUTINE OFFICE VISIT / DM/ LAB RESULTS   Primary Care Provider:  Joaquin Courts  MD  CC:  ROUTINE OFFICE VISIT / LAB RESULTS  / DM   /  PATIENT IS FASTING.  History of Present Illness: Amy Norris is a 45 yo woman with PMH as outlined below.  She is here for f/u of tests done for her chronic diarrhea.  She also had neck/shoulder pain during that last visit thought to be secondary to her work that has improved now.  As for her diarrhea it is ongoing and > 1 year in duration.  Usually has 3-4/day, usually right after eating.  Volume is greatly variable as is consistency.  No blood.  Denies fatty stools or unusual odor.  No pain.  Less than a handfull of times has she had to go in the middle of the night since onset (> 1year).  Depression History:      The patient denies a depressed mood most of the day and a diminished interest in her usual daily activities.         Preventive Screening-Counseling & Management  Alcohol-Tobacco     Alcohol type: BEER / ONLY ON THE WEEKEND     Smoking Status: quit     Year Quit: 1990'S  Caffeine-Diet-Exercise     Does Patient Exercise: yes     Type of exercise: WALKING     Times/week:  2-3  Current Medications (verified): 1)  Lisinopril 40 Mg Tabs (Lisinopril) .... Take 1 Tablet By Mouth Once A Day 2)  Glipizide 10 Mg Tabs (Glipizide) .... Take 1 Tablet By Mouth Two Times A Day 3)  Truetest Test   Strp (Glucose Blood) .... To Test Blood Sugar Once Daily 4)  Lancets   Misc (Lancets) .... To Test Blood Sugar Once Daily 5)  Indomethacin 25 Mg Caps (Indomethacin) .Marland Kitchen.. 1-2 Tab As Required For Pain Up To Three Times A Day With Meal 6)  Lantus 100 Unit/ml Soln (Insulin Glargine) .... Inject 15 Units Under Skin As Instructed At Bedtime. 7)  Bd Insulin Syringe Ultrafine 31g X 5/16" 1 Ml Misc (Insulin Syringe-Needle U-100) .... Use As Directed. 8)  Flexeril 5 Mg Tabs (Cyclobenzaprine Hcl) .... Take 1 Tablet Twice A Day As Needed For Muscle Pain.  Past History:  Past Medical History: Last updated: 12/04/2006 Bronchitis Obesity Renal calculi, hx of Vaginal lesion  Obstructive sleep apnea Carpal tunnel syndrome Herpes simples type I- vaginal Diarrhea Menorrhagia Amenorrhea Depression Diabetes mellitus, type II GERD  Past Surgical  History: Last updated: 01/14/2008 Tubal ligation- 1991 S/p cholecyctectomy 11/08  Family History: Last updated: 01/14/2008 Mom-DM, htn Dad-DM  Social History: Last updated: 01/14/2008 Smoked many years ago, etoh socially, no illegal drugs, married.    Risk Factors: Smoking Status: quit (06/06/2010)  Review of Systems      See HPI  Physical Exam  General:  obese, alert, cooperative Eyes:  anicteric, wearing glasses Lungs:  normal respiratory effort, no accessory muscle use, normal breath sounds, no crackles, and no wheezes.   Heart:  normal rate, regular rhythm, no murmur, no gallop, and no rub.   Abdomen:  soft, non-tender, and normal bowel sounds.   Extremities:  no edema Neurologic:  alert & oriented X3, strength normal in all extremities, and gait normal.   Psych:  Oriented X3, memory intact for recent and remote, and  normally interactive.    Diabetes Management Exam:    Foot Exam (with socks and/or shoes not present):       Sensory-Monofilament:          Left foot: normal          Right foot: normal   Impression & Recommendations:  Problem # 1:  DIARRHEA (ICD-787.91) chronic, labs ordered recently unrevealing (celiac panel, stool cx, O&P)...lactoferrin positive but unclear how to interpret in chronic diarrhea advised to avoid dairy products to see if improvement also s/p cholecystectomy, may consider cholestyramine in future advised to complete process with Jaynee Eagles and consider GI referral afterwards.  ? if IBS ?  Problem # 2:  ESSENTIAL HYPERTENSION, BENIGN (ICD-401.1)  Will change to HCTZ/lisinopril combo after current supply complete recheck BMP on RTC (about 2 weeks after starting combo)  Her updated medication list for this problem includes:    Lisinopril-hydrochlorothiazide 20-25 Mg Tabs (Lisinopril-hydrochlorothiazide) .Marland Kitchen... Take 1 tablet by mouth once a day  BP today: 145/94 Prior BP: 140/85 (05/16/2010)  Labs Reviewed: K+: 4.1 (05/16/2010) Creat: : 0.60 (05/16/2010)   Chol: 178 (07/20/2008)   HDL: 45 (07/20/2008)   LDL: 87 (07/20/2008)   TG: 231 (07/20/2008)  Orders: T-Comprehensive Metabolic Panel ([id number])  Problem # 3:  DIABETES MELLITUS, TYPE II (ICD-250.00) Pt currently taking glipizide only, has not started lantus since she claims her meter is stored away Advised to check sugars regularly and to start lantus Metformin listed under allergies due to diarrhea, however, this is clearly not the etiology....removed from allergies Restart metformin 500mg  two times a day and increase to 1000mg  two times a day if no side effects will have her see Jamison Neighbor today Once on metformin and lantus, would have her check fasting glucose and increase by 1unit/day until goal < 130  recheck lipids on f/u visit  Her updated medication list for this problem includes:     Lisinopril-hydrochlorothiazide 20-25 Mg Tabs (Lisinopril-hydrochlorothiazide) .Marland Kitchen... Take 1 tablet by mouth once a day    Glipizide 10 Mg Tabs (Glipizide) .Marland Kitchen... Take 1 tablet by mouth two times a day    Lantus 100 Unit/ml Soln (Insulin glargine) ..... Inject 15 units under skin as instructed at bedtime.    Metformin Hcl 1000 Mg Tabs (Metformin hcl) .Marland Kitchen... Take 1/2 tablet by mouth two times a day, may increase to 1 tablet two times a day if no side effects after 2 weeks  Orders: T-Lipid Profile ([id number]) Ophthalmology Referral (Ophthalmology)  Labs Reviewed: Creat: 0.60 (05/16/2010)     Last Eye Exam: No diabetic retinopathy.    (04/18/2009) Reviewed HgBA1c results: 10.3 (05/16/2010)  9.1 (12/04/2009)  Complete  Medication List: 1)  Lisinopril-hydrochlorothiazide 20-25 Mg Tabs (Lisinopril-hydrochlorothiazide) .... Take 1 tablet by mouth once a day 2)  Glipizide 10 Mg Tabs (Glipizide) .... Take 1 tablet by mouth two times a day 3)  Truetest Test Strp (Glucose blood) .... To test blood sugar once daily 4)  Lancets Misc (Lancets) .... To test blood sugar once daily 5)  Indomethacin 25 Mg Caps (Indomethacin) .Marland Kitchen.. 1-2 tab as required for pain up to three times a day with meal 6)  Lantus 100 Unit/ml Soln (Insulin glargine) .... Inject 15 units under skin as instructed at bedtime. 7)  Bd Insulin Syringe Ultrafine 31g X 5/16" 1 Ml Misc (Insulin syringe-needle u-100) .... Use as directed. 8)  Flexeril 5 Mg Tabs (Cyclobenzaprine hcl) .... Take 1 tablet twice a day as needed for muscle pain. 9)  Metformin Hcl 1000 Mg Tabs (Metformin hcl) .... Take 1/2 tablet by mouth two times a day, may increase to 1 tablet two times a day if no side effects after 2 weeks  Patient Instructions: 1)  Please schedule a follow-up appointment in 1 month. 2)  See Jamison Neighbor on your way out. 3)  Start metformin as discussed and prescribed below. 4)  Start HCTZ-lisinopril combination pill for your blood pressure. 5)   Will need blood work in 2 weeks. 6)  Make sure to get meter and start checking sugars and start insulin as prescribed. 7)  Will have you increase your lantus on your own once you start measuring your sugars regularly. 8)  Get set up with Jaynee Eagles and will refer you to gastroenterologist in future. 9)  Make sure to avoid dairy products for a few days to see if that helps with the diarrhea.  Prescriptions: METFORMIN HCL 1000 MG TABS (METFORMIN HCL) take 1/2 tablet by mouth two times a day, may increase to 1 tablet two times a day if no side effects after 2 weeks  #30 x 3   Entered and Authorized by:   Mariea Stable MD   Signed by:   Mariea Stable MD on 06/06/2010   Method used:   Electronically to        [facility] (retail)       [address]       [place], [place]  [address]       Ph: [phone]       Fax: [phone]   RxID:   [id number] LISINOPRIL-HYDROCHLOROTHIAZIDE 20-25 MG TABS (LISINOPRIL-HYDROCHLOROTHIAZIDE) Take 1 tablet by mouth once a day  #30 x 3   Entered and Authorized by:   Mariea Stable MD   Signed by:   Mariea Stable MD on 06/06/2010   Method used:   Electronically to        [facility] (retail)       [address]       [place], [place]  [address]       Ph: [phone]       Fax: [phone]   RxID:   [id number]  Process Orders Check Orders Results:     [facility]: ABN not required for this insurance Tests Sent for requisitioning (June 06, 2010 10:33 AM):     06/06/2010: [facility] -- T-Lipid Profile [id number] (signed)     06/06/2010: [facility] -- T-Comprehensive Metabolic Panel [phone] (signed)    Prevention & Chronic Care Immunizations  Influenza vaccine: Fluvax Non-MCR  (01/04/2010)    Tetanus booster: 01/04/2010: Tdap    Pneumococcal vaccine: Not documented  Other Screening   Pap smear: Normal   (07/08/2006)   Pap smear action/deferral: Deferred  (12/04/2009)    Mammogram: Not documented   Mammogram action/deferral: Deferred to age 70  (12/04/2009)   Smoking status: quit  (06/06/2010)  Diabetes Mellitus   HgbA1C: 10.3  (05/16/2010)    Eye exam: No diabetic retinopathy.     (04/18/2009)   Diabetic eye exam action/deferral: Ophthalmology referral  (06/06/2010)   Eye exam due: 04/2010    Foot exam: yes  (06/06/2010)   High risk foot: Not documented   Foot care education: Not documented    Urine microalbumin/creatinine ratio: 17.4  (09/20/2008)   Urine microalbumin action/deferral: Not indicated    Diabetes flowsheet reviewed?: Yes   Progress toward A1C goal: Unchanged  Lipids   Total Cholesterol: 178  (07/20/2008)   Lipid panel action/deferral: Lipid Panel ordered   LDL: 87  (07/20/2008)   LDL Direct: Not documented   HDL: 45  (07/20/2008)   Triglycerides: 231  (07/20/2008)  Hypertension   Last Blood Pressure: 145 / 94  (06/06/2010)   Serum creatinine: 0.60  (05/16/2010)   Serum potassium 4.1  (05/16/2010) CMP ordered     Hypertension flowsheet reviewed?: Yes   Progress toward BP goal: Unchanged  Self-Management Support :   Personal Goals (by the next clinic visit) :     Personal A1C goal: 7  (12/04/2009)     Personal blood pressure goal: 140/90  (12/04/2009)     Personal LDL goal: 130  (12/04/2009)    Diabetes self-management support: Written self-care plan  (06/06/2010)   Diabetes care plan printed   Last diabetes self-management training by diabetes educator: 01/18/2008    Hypertension self-management support: Written self-care plan  (06/06/2010)   Hypertension self-care plan printed.   Nursing Instructions: Refer for screening diabetic eye exam (see order)   Last LDL:                                                 87 (07/20/2008 8:15:00 PM)        Diabetic Foot Exam Foot Inspection Is there a history of a foot ulcer?              No Is  there a foot ulcer now?              No Can the patient see the bottom of their feet?          Yes Are the shoes appropriate in style and fit?          Yes Is there swelling or an abnormal foot shape?          Yes Are the toenails long?                No Are the toenails thick?                No Are the toenails ingrown?              No Is there heavy callous build-up?              No Is there a claw toe deformity?  No Is there elevated skin temperature?            No Is there limited ankle dorsiflexion?            No Is there foot or ankle muscle weakness?            Yes Do you have pain in calf while walking?           No         10-g (5.07) Semmes-Weinstein Monofilament Test Performed by: Theotis Barrio NT II          Right Foot          Left Foot Visual Inspection     normal           normal Test Control      normal         normal Site 1         normal         normal Site 2         normal         normal Site 3         normal         normal Site 4         normal         normal Site 5         normal         normal Site 6         normal         normal Site 7         normal         normal Site 8         normal         normal Site 9         normal         normal Site 10         normal         normal  Impression      normal         normal

## 2011-01-29 NOTE — Assessment & Plan Note (Signed)
Summary: ACUTE-RIGHT SHOULDER PAIN-([facility])/CFB   Vital Signs:  Patient profile:   45 year old female Height:      65.5 inches (166.37 cm) Weight:      256.1 pounds (116.41 kg) BMI:     42.12 Temp:     97.5 degrees F (36.39 degrees C) oral Pulse rate:   74 / minute BP sitting:   140 / 85  (right arm) Cuff size:   large  Vitals Entered By: Cynda Familia Duncan Dull) (May 16, 2010 10:41 AM) CC: pt c/o right shoulder pain and diarrhea after meals, Depression Is Patient Diabetic? Yes Did you bring your meter with you today? No Pain Assessment Patient in pain? yes     Location: shoulder Onset of pain  constant x 2wks Nutritional Status BMI of 19 -24 = normal  Does patient need assistance? Functional Status Self care Ambulation Normal   Primary Care Provider:  Joaquin Courts  MD  CC:  pt c/o right shoulder pain and diarrhea after meals and Depression.  History of Present Illness: 45 year old with Past Medical History: Bronchitis Obesity Renal calculi, hx of Vaginal lesion  Obstructive sleep apnea Carpal tunnel syndrome Herpes simples type I- vaginal Diarrhea Menorrhagia Amenorrhea Depression Diabetes mellitus, type II GERD    1- She is here complaining of right shoulder and neck  pain. pain is worse with movement, started couple weeks ago. She lift boxes at work, 35 and 40 pounds.  2- She is taking her diabetes medications. She is not checking her blood sugar at home.  3- She is having diarrhea, for last couple of years. They stop metfomin but diarrhea has not improved. She has 4 to 5 bowel movement specially after she eats, watery. She eats lactous products..   Preventive Screening-Counseling & Management  Alcohol-Tobacco     Alcohol type: BEER / ONLY ON THE WEEKEND     Smoking Status: quit     Year Quit: 1990'S  Current Medications (verified): 1)  Lisinopril 40 Mg Tabs (Lisinopril) .... Take 1 Tablet By Mouth Once A Day 2)  Glipizide 10 Mg Tabs (Glipizide)  .... Take 1 Tablet By Mouth Two Times A Day 3)  Truetest Test   Strp (Glucose Blood) .... To Test Blood Sugar Once Daily 4)  Lancets   Misc (Lancets) .... To Test Blood Sugar Once Daily 5)  Indomethacin 25 Mg Caps (Indomethacin) .Marland Kitchen.. 1-2 Tab As Required For Pain Up To Three Times A Day With Meal  Allergies: 1)  ! * Metformin  Review of Systems  The patient denies fever, chest pain, syncope, dyspnea on exertion, peripheral edema, prolonged cough, headaches, hemoptysis, abdominal pain, and melena.    Physical Exam  General:  alert, well-developed, and well-nourished.   Head:  normocephalic and atraumatic.   Neck:  supple, full ROM, and no masses.   Lungs:  normal respiratory effort, no intercostal retractions, no accessory muscle use, and normal breath sounds.   Heart:  normal rate and regular rhythm.   Abdomen:  soft, non-tender, normal bowel sounds, no distention, and no masses.   Msk:  normal ROM, no joint tenderness, no joint swelling, no joint warmth, no redness over joints, and no joint deformities.  Pain on palpation muscle, neck area.    Impression & Recommendations:  Problem # 1:  DIARRHEA (ICD-787.91) She has chronic diarrhea, differential: celiac diseases, bacterial overgrowth, functional diarrhea. I will start with Cmet, celiac diseases work up, stool culture and ova and parasite. If everything is negative  she will need colonoscopy.   Orders: T-Comprehensive Metabolic Panel [id number]) T-Culture, Stool ([id number]) T-Culture, Giardia / Cryptosporidium ([id number]) T- * Misc. Laboratory test [id number])  Problem # 2:  SHOULDER PAIN, RIGHT (ICD-719.41) I think this is muscle spasm. I will prescribe flexeril, once a day. I advised patient not to drive or operate machines if she takes flexeril. I will give refill fro indomethacin also.  Her updated medication list for this problem includes:    Indomethacin 25 Mg Caps (Indomethacin) .Marland Kitchen... 1-2 tab as required for pain up  to three times a day with meal    Flexeril 5 Mg Tabs (Cyclobenzaprine hcl) .Marland Kitchen... Take 1 tablet twice a day as needed for muscle pain.  Problem # 3:  DIABETES MELLITUS, TYPE II (ICD-250.00) Her Last HbA1c was at 9 last year. Today HBA1c was at 10. I will start lantus, 15 units. I advised her to let us know if she has any hypoglycemic symptoms, we might need to decrease glipizide if that is the case.  Her updated medication list for this problem includes:    Lisinopril 40 Mg Tabs (Lisinopril) .Marland Kitchen... Take 1 tablet by mouth once a day    Glipizide 10 Mg Tabs (Glipizide) .Marland Kitchen... Take 1 tablet by mouth two times a day    Lantus 100 Unit/ml Soln (Insulin glargine) ..... Inject 15 units under skin as instructed at bedtime.  Orders: T-Hgb A1C (in-house) ([id number]) T- Capillary Blood Glucose (84696)  Problem # 4:  ESSENTIAL HYPERTENSION, BENIGN (ICD-401.1) I will give refill and I will check Cmet today. Will need to check BP and adjust medications next visit.  Her updated medication list for this problem includes:    Lisinopril 40 Mg Tabs (Lisinopril) .Marland Kitchen... Take 1 tablet by mouth once a day  Orders: T-Comprehensive Metabolic Panel ([id number])  BP today: 140/85 Prior BP: 133/90 (01/04/2010)  Labs Reviewed: K+: 4.0 (01/04/2010) Creat: : 0.58 (01/04/2010)   Chol: 178 (07/20/2008)   HDL: 45 (07/20/2008)   LDL: 87 (07/20/2008)   TG: 231 (07/20/2008)  Complete Medication List: 1)  Lisinopril 40 Mg Tabs (Lisinopril) .... Take 1 tablet by mouth once a day 2)  Glipizide 10 Mg Tabs (Glipizide) .... Take 1 tablet by mouth two times a day 3)  Truetest Test Strp (Glucose blood) .... To test blood sugar once daily 4)  Lancets Misc (Lancets) .... To test blood sugar once daily 5)  Indomethacin 25 Mg Caps (Indomethacin) .Marland Kitchen.. 1-2 tab as required for pain up to three times a day with meal 6)  Lantus 100 Unit/ml Soln (Insulin glargine) .... Inject 15 units under skin as instructed at bedtime. 7)  Bd Insulin  Syringe Ultrafine 31g X 5/16" 1 Ml Misc (Insulin syringe-needle u-100) .... Use as directed. 8)  Flexeril 5 Mg Tabs (Cyclobenzaprine hcl) .... Take 1 tablet twice a day as needed for muscle pain.   Patient Instructions: 1)  Please schedule a follow-up appointment in 2 weeks. 2)  please follow also with Scherrie Gerlach for Diabetes education.  Prescriptions: FLEXERIL 5 MG TABS (CYCLOBENZAPRINE HCL) Take 1 tablet twice a day as needed for muscle pain.  #30 x 1   Entered and Authorized by:   Hartley Barefoot MD   Signed by:   Hartley Barefoot MD on 05/16/2010   Method used:   Print then Give to Patient   RxID:   [id number] BD INSULIN SYRINGE ULTRAFINE 31G X 5/16" 1 ML MISC (INSULIN SYRINGE-NEEDLE U-100) use as directed.  #1 box x 5  Entered and Authorized by:   Hartley Barefoot MD   Signed by:   Hartley Barefoot MD on 05/16/2010   Method used:   Print then Give to Patient   RxID:   [id number] LANTUS 100 UNIT/ML SOLN (INSULIN GLARGINE) inject 15 units under skin as instructed at bedtime.  #5 vials x 6   Entered and Authorized by:   Hartley Barefoot MD   Signed by:   Hartley Barefoot MD on 05/16/2010   Method used:   Print then Give to Patient   RxID:   [id number] INDOMETHACIN 25 MG CAPS (INDOMETHACIN) 1-2 tab as required for pain up to three times a day with meal  #25 x 0   Entered and Authorized by:   Hartley Barefoot MD   Signed by:   Hartley Barefoot MD on 05/16/2010   Method used:   Print then Give to Patient   RxID:   [id number] LANCETS   MISC (LANCETS) to test blood sugar once daily  #1box x 4   Entered and Authorized by:   Hartley Barefoot MD   Signed by:   Hartley Barefoot MD on 05/16/2010   Method used:   Print then Give to Patient   RxID:   [id number] TRUETEST TEST   STRP (GLUCOSE BLOOD) to test blood sugar once daily  #1box x 4   Entered and Authorized by:   Hartley Barefoot MD   Signed by:   Hartley Barefoot MD on 05/16/2010   Method used:   Print then Give to  Patient   RxID:   [id number] GLIPIZIDE 10 MG TABS (GLIPIZIDE) Take 1 tablet by mouth two times a day  #62 x 2   Entered and Authorized by:   Hartley Barefoot MD   Signed by:   Hartley Barefoot MD on 05/16/2010   Method used:   Print then Give to Patient   RxID:   [id number] LISINOPRIL 40 MG TABS (LISINOPRIL) Take 1 tablet by mouth once a day  #31 x 3   Entered and Authorized by:   Hartley Barefoot MD   Signed by:   Hartley Barefoot MD on 05/16/2010   Method used:   Print then Give to Patient   RxID:   [id number]  Process Orders Check Orders Results:     [facility]: ABN not required for this insurance Tests Sent for requisitioning (May 16, 2010 4:04 PM):     05/16/2010: [facility] -- T-Comprehensive Metabolic Panel [[id number]] (signed)     05/16/2010: [facility] -- T-Culture, Stool [[id number]] (signed)     05/16/2010: [facility] -- T-Culture, Giardia / Cryptosporidium [[id number]] (signed)     05/16/2010: [facility] -- T- * Misc. Laboratory test [id number] (signed)    Prevention & Chronic Care Immunizations   Influenza vaccine: Fluvax Non-MCR  (01/04/2010)    Tetanus booster: 01/04/2010: Tdap    Pneumococcal vaccine: Not documented  Other Screening   Pap smear: Normal  (07/08/2006)   Pap smear action/deferral: Deferred  (12/04/2009)    Mammogram: Not documented   Mammogram action/deferral: Deferred to age 37  (12/04/2009)   Smoking status: quit  (05/16/2010)  Diabetes Mellitus   HgbA1C: 10.3  (05/16/2010)    Eye exam: No diabetic retinopathy.     (04/18/2009)   Eye exam due: 04/2010    Foot exam: yes  (12/04/2009)   High risk foot: Not documented   Foot care education: Not documented    Urine microalbumin/creatinine ratio: 17.4  (09/20/2008)  Lipids  Total Cholesterol: 178  (07/20/2008)   Lipid panel action/deferral: Deferred   LDL: 87  (07/20/2008)    LDL Direct: Not documented   HDL: 45  (07/20/2008)   Triglycerides: 231  (07/20/2008)  Hypertension   Last Blood Pressure: 140 / 85  (05/16/2010)   Serum creatinine: 0.58  (01/04/2010)   Serum potassium 4.0  (01/04/2010) CMP ordered   Self-Management Support :   Personal Goals (by the next clinic visit) :     Personal A1C goal: 7  (12/04/2009)     Personal blood pressure goal: 140/90  (12/04/2009)     Personal LDL goal: 130  (12/04/2009)    Patient will work on the following items until the next clinic visit to reach self-care goals:     Medications and monitoring: take my medicines every day  (05/16/2010)     Eating: eat foods that are low in salt, eat baked foods instead of fried foods  (05/16/2010)     Activity: take a 30 minute walk every day  (12/04/2009)    Diabetes self-management support: Pre-printed educational material  (05/16/2010)   Last diabetes self-management training by diabetes educator: 01/18/2008    Hypertension self-management support: Pre-printed educational material  (05/16/2010)  Laboratory Results   Blood Tests   Date/Time Received: May 16, 2010 10:59 AM  Date/Time Reported: Burke Keels  May 16, 2010 10:59 AM   HGBA1C: 10.3%   (Normal Range: Non-Diabetic - 3-6%   Control Diabetic - 6-8%) CBG Fasting:: 349mg /dL      Process Orders Check Orders Results:     [facility]: ABN not required for this insurance Tests Sent for requisitioning (May 16, 2010 4:04 PM):     05/16/2010: [facility] -- T-Comprehensive Metabolic Panel [[id number]] (signed)     05/16/2010: [facility] -- T-Culture, Stool [[id number]] (signed)     05/16/2010: [facility] -- T-Culture, Giardia / Cryptosporidium [[id number]] (signed)     05/16/2010: [facility] -- T- * Misc. Laboratory test [id number] (signed)

## 2011-01-29 NOTE — Assessment & Plan Note (Signed)
Summary: itching stomach,back see previous visit/pcp-wilson/hla   Vital Signs:  Patient profile:   45 year old female Height:      65.5 inches (166.37 cm) Weight:      245.9 pounds (114.64 kg) BMI:     41.48 Temp:     99.1 degrees F (37.28 degrees C) oral Pulse rate:   91 / minute BP sitting:   133 / 90  (left arm) Cuff size:   large  Vitals Entered By: Theotis Barrio NT II (January 04, 2010 1:42 PM) CC: FLU SHOT AND  TETANUS SHOT REQUESTED    /  DM   / STILL HAVING TICHING ON ABD-BACK- BREAST Is Patient Diabetic? Yes Did you bring your meter with you today? No Pain Assessment Patient in pain? no      Nutritional Status BMI of > 30 = obese CBG Result 216  Have you ever been in a relationship where you felt threatened, hurt or afraid?No   Does patient need assistance? Functional Status Self care Ambulation Normal Comments PATIENT REQUEST FLU AND TETANUS SHOT   / STILL HAVING ITCHING ABD-BACK-BREAST.   Primary Care Provider:  Joaquin Courts  MD  CC:  FLU SHOT AND  TETANUS SHOT REQUESTED    /  DM   / STILL HAVING TICHING ON ABD-BACK- BREAST.  History of Present Illness: 45 yo woman with PMH as listed below who presents for followup of pruritis.  The patient was last seen 12-04-2009 by Dr. Sherryll Burger for "itching all over." The itching started  ~2 weeks before her last visit. At last visit she was given benadryl, which helped some but has definitely not made the itching go away. The itching is located on the sides of her breasts, under her breasts, on her abdomen, and in her lower back. It is worst on the stomach and lower back. It is constant all day.  Nothing seems to make it go away.  Has not changed soaps, has not changed detergents, no new meds. Uses Extra detergent and has used for years. Is not sure what type of soap she uses.  She is scratching to the point where she is scratching through her skin. She tells me that she did not have bumps or a rash that itches but rather  had skin changes only after scratching.  She thinks that she has some dry skin, especially on her legs right now. She does not think that she is particularly prone to irritated or dry skin.   She says that she has had no rash. She does have some excoriations but she says that these are strictly from scratching.   Denies any bug bites. No one else around her has the same thing going on. This has never happened before this episode that she was seen for at last visit.   Patient also has had a cough and runny nose for a few days and thinks she has a "cold."  The patient does endorse a great amount of stress as her husband has to have brain surgery, they have lost their house, they lost their car, etc.    Preventive Screening-Counseling & Management  Alcohol-Tobacco     Alcohol type: BEER / ONLY ON THE WEEKEND     Smoking Status: quit     Year Quit: 1990'S  Caffeine-Diet-Exercise     Does Patient Exercise: yes     Type of exercise: WALKING     Times/week: 2-3  Problems Prior to Update: 1)  Unspecified Urticaria  (  ICD-708.9) 2)  Epicondylitis, Left  (ICD-726.32) 3)  Diarrhea  (ICD-787.91) 4)  Shoulder Pain, Left  (ICD-719.41) 5)  Foot Pain, Left  (ICD-729.5) 6)  Anal Fissure  (ICD-565.0) 7)  Orthostatic Dizziness  (ICD-780.4) 8)  Vaginal Pruritus  (ICD-698.1) 9)  Vaginal Discharge  (ICD-623.5) 10)  Essential Hypertension, Benign  (ICD-401.1) 11)  Breast Mass  (ICD-611.72) 12)  Cholelithiasis  (ICD-574.20) 13)  Gerd  (ICD-530.81) 14)  Diabetes Mellitus, Type II  (ICD-250.00) 15)  Depression  (ICD-311) 16)  Polycystic Ovarian Disease  (ICD-256.4) 17)  Carpal Tunnel Syndrome  (ICD-354.0) 18)  Renal Calculus, Hx of  (ICD-V13.01) 19)  Sleep Apnea, Obstructive  (ICD-327.23) 20)  Obesity  (ICD-278.00) 21)  Herpes Simplex Infection, 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 * Metformin  Review of Systems       The patient complains of prolonged cough.  The patient  denies anorexia, fever, weight loss, weight gain, vision loss, decreased hearing, hoarseness, chest pain, syncope, dyspnea on exertion, peripheral edema, headaches, hemoptysis, abdominal pain, melena, hematochezia, severe indigestion/heartburn, hematuria, incontinence, genital sores, muscle weakness, transient blindness, difficulty walking, depression, unusual weight change, and abnormal bleeding.         Has a cough that she says is from a cold.  Has itching and excoriations from scratching as per HPI.   Physical Exam  General:  alert, well-developed, well-nourished, and well-hydrated.   Head:  normocephalic and atraumatic.   Eyes:  vision grossly intact, pupils equal, pupils round, and pupils reactive to light.   Ears:  R ear normal and L ear normal.   Nose:  no external deformity.   Lungs:  normal respiratory effort, no accessory muscle use, normal breath sounds, no crackles, and no wheezes.   Heart:  normal rate, regular rhythm, no murmur, no gallop, and no rub.   Abdomen:  soft, non-tender, and normal bowel sounds.   Neurologic:  alert & oriented X3, cranial nerves II-XII intact, and strength normal in all extremities.   Skin:  Patient has striae on abdomen. Patient has excoriations that she says are from scratching. No other rash identified.  Psych:  Oriented X3, memory intact for recent and remote, normally interactive, good eye contact, not anxious appearing, and not depressed appearing.     Impression & Recommendations:  Problem # 1:  PRURITUS (ICD-698.9) This could represent a wide variety of conditions. The patient does endorse significant stressors, and this could  be the cause. I have advised her to use ALL detergent and Dove soap as this might help. I have asked that she keep her skin moisturized. Other differential considerations are iron-deficiency anemia, leukemia, cholestasis, thyroid dysfunctions, HIV. 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 She  reports monogamy and no drug use, etc. It might be a test we can order if this does not resolve.  She does have diabetes that can cause pruritis but there is no way to test for this cause and effect relationship and we should just treat her diabetes regardless. I will check CBC, Cmet, TSH today to eval for anemia, leukemia, liver dysfunction, thyroid dysfunction. I have stopped benadryl as it was not helping fully. I will Rx hydroxyzine. She will return in 1 month.   **TSH normal. No signs of anemia. LFTs and bili WNL.    Orders: T-Comprehensive Metabolic Panel [phone]) T-CBC w/Diff [id number]) T-TSH [id number])  Problem # 2:  URI (ICD-465.9) Patient has cough and symptoms of  URI. Lungs are clear. Afebrile. Will simply monitor.   *WBC minimally elevated. Likely 2/2 this URI. Can recheck at next visit.   Complete Medication List: 1)  Lisinopril 40 Mg Tabs (Lisinopril) .... Take 1 tablet by mouth once a day 2)  Glipizide 10 Mg Tabs (Glipizide) .... Take 1 tablet by mouth two times a day 3)  Truetest Test Strp (Glucose blood) .... To test blood sugar once daily 4)  Lancets Misc (Lancets) .... To test blood sugar once daily 5)  Anti-fungal 2 % Crea (Miconazole nitrate) .... Apply to vaginal area once a day for 7 days. 6)  Indomethacin 25 Mg Caps (Indomethacin) .Marland Kitchen.. 1-2 tab as required for pain up to three times a day with meal 7)  Hydroxyzine Hcl 25 Mg Tabs (Hydroxyzine hcl) .... Please take 1 tab by mouth every 6 hours as needed for itching.  Other Orders: Capillary Blood Glucose/CBG ([id number]) Influenza Vaccine NON MCR (60737) Tdap => 60yrs IM (10626) Admin 1st Vaccine (94854)  Patient Instructions: 1)  Please make an appointment in 1 month to assess your symptoms and to manage your other health complaints. 2)  Note that I have prescribed an anti-itch medication hydroxyzine. This is sedating. Please do not drive a car or operate machinery while using this medication.    Prescriptions: HYDROXYZINE HCL 25 MG TABS (HYDROXYZINE HCL) Please take 1 tab by mouth every 6 hours as needed for itching.  #60 x 1   Entered and Authorized by:   Aris Lot MD   Signed by:   Aris Lot MD on 01/04/2010   Method used:   Faxed to ...       [facility]       [address]       [place], [place]  [address]       Ph: [phone]       Fax: [phone]   RxID:   [id number]  Process Orders Check Orders Results:     [facility]: ABN not required for this insurance Tests Sent for requisitioning (January 05, 2010 9:47 AM):     01/04/2010: [facility] -- T-Comprehensive Metabolic Panel [[id number]] (signed)     01/04/2010: [facility] -- T-CBC w/Diff [[id number]] (signed)     01/04/2010: [facility] -- T-TSH [id number] (signed)    Prevention & Chronic Care Immunizations   Influenza vaccine: Fluvax Non-MCR  (01/04/2010)    Tetanus booster: 01/04/2010: Tdap    Pneumococcal vaccine: Not documented  Other Screening   Pap smear: Normal  (07/08/2006)   Pap smear action/deferral: Deferred  (12/04/2009)    Mammogram: Not documented   Mammogram action/deferral: Deferred to age 24  (12/04/2009)   Smoking status: quit  (01/04/2010)  Diabetes Mellitus   HgbA1C: 9.1  (12/04/2009)    Eye exam: No diabetic retinopathy.     (04/18/2009)   Eye exam due: 04/2010    Foot exam: yes  (12/04/2009)   High risk foot: Not documented   Foot care education: Not documented    Urine microalbumin/creatinine ratio: 17.4  (09/20/2008)  Lipids   Total Cholesterol: 178  (07/20/2008)   Lipid panel action/deferral: Deferred   LDL: 87  (07/20/2008)   LDL Direct: Not documented   HDL: 45  (07/20/2008)   Triglycerides: 231  (07/20/2008)  Hypertension   Last Blood Pressure: 133 / 90  (01/04/2010)   Serum creatinine: 0.69  (02/21/2009)   Serum  potassium 4.0   (02/21/2009) CMP ordered   Self-Management Support :   Personal Goals (by the next clinic visit) :     Personal A1C goal: 7  (12/04/2009)     Personal blood pressure goal: 140/90  (12/04/2009)     Personal LDL goal: 130  (12/04/2009)    Diabetes self-management support: Written self-care plan  (12/04/2009)   Last diabetes self-management training by diabetes educator: 01/18/2008    Hypertension self-management support: Written self-care plan  (12/04/2009)   Nursing Instructions: Give Flu vaccine today Give tetanus booster today. TdaP.     Immunizations Administered:  Influenza Vaccine # 1:    Vaccine Type: Fluvax Non-MCR    Site: right deltoid    Mfr: Novartis    Dose: 0.5 ml    Route: IM    Given by: Angelina Ok RN    Exp. Date: 03/2010    Lot #: [id number]    VIS given: 07/23/07 version given January 04, 2010.  Tetanus Vaccine:    Vaccine Type: Tdap    Site: right deltoid    Mfr: GlaxoSmithKline    Dose: 0.5 ml    Route: IM    Given by: Angelina Ok RN    Exp. Date: 07/15/2011    Lot #: [id number]    VIS given: 11/17/07 version given January 04, 2010.  Flu Vaccine Consent Questions:    Do you have a history of severe allergic reactions to this vaccine? no    Any prior history of allergic reactions to egg and/or gelatin? no    Do you have a sensitivity to the preservative Thimersol? no    Do you have a past history of Guillan-Barre Syndrome? no    Do you currently have an acute febrile illness? no    Have you ever had a severe reaction to latex? no    Vaccine information given and explained to patient? yes    Are you currently pregnant? no

## 2011-01-29 NOTE — Progress Notes (Signed)
Summary: phone/gg  Phone Note Call from Patient   Caller: Patient Summary of Call: Pt called c/o rt hand, arm and rt side of face feels weird, feels weak.  Hands feel like they are going to sleep.  Onset today.CBG 181 this AM.    BP 163/90 & 153/93.  She took BP meds and insulin ( up to 19 units as of today) . She has firedepartment coming over to check BP and CBG.  non emergency.  Pt is at work at this time.   Otherwise she feels fine.  Pt # [id number] Initial call taken by: Merrie Roof RN,  June 11, 2010 3:48 PM  Follow-up for Phone Call        Hard to evaluated over phone. Pt sent to ED for evaluation per order Dr Coralee Pesa Patient/caller verbalizes understanding of these instructions.  Follow-up by: Merrie Roof RN,  June 11, 2010 4:28 PM  Additional Follow-up for Phone Call Additional follow up Details #1::       Additional Follow-up by: Zoila Shutter MD,  June 11, 2010 4:33 PM

## 2011-01-31 NOTE — Progress Notes (Signed)
Summary: appt/ hla  Phone Note Call from Patient   Summary of Call: pt called, left message requesting appt, rtc, vmail, left message for pt to call back Initial call taken by: Marin Roberts RN,  January 01, 2011 12:10 PM     Appended Document: appt/ hla called pt and spoke w/ her this time, states she has felt bad since mon night, has abd pain, feverish, h/a, diarrhea, pt is ask to go to urg care now, she is agreeable

## 2011-01-31 NOTE — Assessment & Plan Note (Signed)
Summary: acute-sinus problems and bottom itching/ho/cfb   Vital Signs:  Patient profile:   45 year old female Height:      65.5 inches Weight:      242.7 pounds BMI:     39.92 Temp:     97.6 degrees F oral Pulse rate:   78 / minute BP sitting:   138 / 90  (right arm)  Vitals Entered By: Filomena Jungling NT II (January 18, 2011 11:01 AM) CC: head and bottom, ?sinus sick since the begining of the month Is Patient Diabetic? Yes Did you bring your meter with you today? No Pain Assessment Patient in pain? yes     Location: bottom, and head Intensity: 5 Type: aching Nutritional Status BMI of > 30 = obese  Have you ever been in a relationship where you felt threatened, hurt or afraid?No   Does patient need assistance? Functional Status Self care Ambulation Normal   Primary Care Provider:  Jaci Lazier MD  CC:  head and bottom and ?sinus sick since the begining of the month.  History of Present Illness: 45 y/o with DM, Depression comes to the clinc for sinus problem and itching on her buttocl  sinus pain- going on since 3 weeks progressively getting worse tried otc meds no fever, no chills,  has some sore throat, mild sob  no sick contacts non smoker  itching on the bottom - itching on the butock, both sides - not noticed any rash - has tried otc meds which did not help.   Current Medications (verified): 1)  Lisinopril-Hydrochlorothiazide 20-25 Mg Tabs (Lisinopril-Hydrochlorothiazide) .... Take 1 Tablet By Mouth Once A Day 2)  Glipizide 10 Mg Tabs (Glipizide) .... Take 1 Tablet By Mouth Two Times A Day 3)  Truetest Test   Strp (Glucose Blood) .... To Test Blood Sugar Once Daily 4)  Lancets   Misc (Lancets) .... To Test Blood Sugar Once Daily 5)  Bd Insulin Syringe Ultrafine 31g X 5/16" 1 Ml Misc (Insulin Syringe-Needle U-100) .... Use As Directed. 6)  Metformin Hcl 1000 Mg Tabs (Metformin Hcl) .... Take 1 Tablet By Mouth Two Times A Day 7)  Cholestyramine  Powd  (Cholestyramine) .... Dissolve 1 Scoop (4grams) of Powder in Juice or Water and Drink Every Morning 8)  Potassium Chloride 20 Meq Pack (Potassium Chloride) .... Take By Mouth Daily 9)  Welchol 3.75 Gm Pack (Colesevelam Hcl) .... Take By Mouth Once Daily 10)  Fluoxetine Hcl 20 Mg Caps (Fluoxetine Hcl) .... Take By Mouth Daily  Allergies (verified): No Known Drug Allergies  Review of Systems       The patient complains of hoarseness.  The patient denies anorexia, fever, weight loss, weight gain, vision loss, decreased hearing, chest pain, syncope, dyspnea on exertion, peripheral edema, prolonged cough, headaches, hemoptysis, abdominal pain, melena, hematochezia, severe indigestion/heartburn, hematuria, incontinence, genital sores, muscle weakness, suspicious skin lesions, transient blindness, difficulty walking, depression, unusual weight change, abnormal bleeding, enlarged lymph nodes, angioedema, breast masses, and testicular masses.    Physical Exam  General:  Gen: VS reveiwed, Alert, well developed, nodistress ENT: mucous membranes pink & moist. No abnormal finds in ear and nose. sinus tenderness b/l CVC:S1 S2 , no murmurs, no abnormal heart sounds. Lungs: Clear to auscultation B/L. No wheezes, crackles or other abnormal sounds Abdomen: soft, non distended, no tender. Normal Bowel sounds EXT: no pitting edema, no engorged veins, Pulsations normal  Neuro:alert, oriented *3, cranial nerved 2-12 intact, strenght normal in all  extremities, senstations normal  to light touch.      Impression & Recommendations:  Problem # 1:  SINUSITIS, ACUTE (ICD-461.9) will treat with amoxicillin for 7 days continue OTC decongestants.  f-up in 2 weeks if not better  Her updated medication list for this problem includes:    Amoxicillin 500 Mg Caps (Amoxicillin) .Marland Kitchen... Take 1 tablet by mouth two times a day for 7 days  Her updated medication list for this problem includes:    Amoxicillin 500 Mg  Caps (Amoxicillin) .Marland Kitchen... Take 1 tablet by mouth two times a day for 7 days  Complete Medication List: 1)  Lisinopril-hydrochlorothiazide 20-25 Mg Tabs (Lisinopril-hydrochlorothiazide) .... Take 1 tablet by mouth once a day 2)  Glipizide 10 Mg Tabs (Glipizide) .... Take 1 tablet by mouth two times a day 3)  Truetest Test Strp (Glucose blood) .... To test blood sugar once daily 4)  Lancets Misc (Lancets) .... To test blood sugar once daily 5)  Bd Insulin Syringe Ultrafine 31g X 5/16" 1 Ml Misc (Insulin syringe-needle u-100) .... Use as directed. 6)  Metformin Hcl 1000 Mg Tabs (Metformin hcl) .... Take 1 tablet by mouth two times a day 7)  Cholestyramine Powd (Cholestyramine) .... Dissolve 1 scoop (4grams) of powder in juice or water and drink every morning 8)  Potassium Chloride 20 Meq Pack (Potassium chloride) .... Take by mouth daily 9)  Welchol 3.75 Gm Pack (Colesevelam hcl) .... Take by mouth once daily 10)  Fluoxetine Hcl 20 Mg Caps (Fluoxetine hcl) .... Take by mouth daily 11)  Amoxicillin 500 Mg Caps (Amoxicillin) .... Take 1 tablet by mouth two times a day for 7 days 12)  Diphenhydramine-zinc Acetate 2-0.1 % Crea (Diphenhydramine-zinc acetate) .... As needed for itching 13)  Hydroxyzine Pamoate 25 Mg Caps (Hydroxyzine pamoate) .Marland Kitchen.. 1 tablet every 6 hrs as needed for itching  Other Orders: T- Capillary Blood Glucose (82948) T-Hgb A1C (in-house) (40981XB)  Patient Instructions: 1)  Please schedule a follow-up appointment in 1 month with pcp Prescriptions: HYDROXYZINE PAMOATE 25 MG CAPS (HYDROXYZINE PAMOATE) 1 tablet every 6 hrs as needed for itching  #60 x 0   Entered and Authorized by:   Bethel Born MD   Signed by:   Bethel Born MD on 01/18/2011   Method used:   Electronically to        [facility] (retail)       [address]       [place], [place]  [address]       Ph: [phone]       Fax: [phone]   RxID:    [id number] DIPHENHYDRAMINE-ZINC ACETATE 2-0.1 % CREA (DIPHENHYDRAMINE-ZINC ACETATE) as needed for itching  #3 x prn   Entered and Authorized by:   Bethel Born MD   Signed by:   Bethel Born MD on 01/18/2011   Method used:   Electronically to        [facility] (retail)       [address]       [place], [place]  [address]       Ph: [phone]       Fax: [phone]   RxID:   [id number] AMOXICILLIN 500 MG CAPS (AMOXICILLIN) Take 1 tablet by mouth two times a day for 7 days  #14 x 0   Entered and Authorized by:   Bethel Born MD   Signed by:  Bethel Born MD on 01/18/2011   Method used:   Electronically to        [facility] (retail)       [address]       [place], [place]  [address]       Ph: [phone]       Fax: [phone]   RxID:   [id number]    Orders Added: 1)  T- Capillary Blood Glucose [82948] 2)  T-Hgb A1C (in-house) [83036QW] 3)  Est. Patient Level III [55732]    Prevention & Chronic Care Immunizations   Influenza vaccine: Fluvax 3+  (10/30/2010)    Tetanus booster: 01/04/2010: Tdap    Pneumococcal vaccine: Not documented  Other Screening   Pap smear: Normal  (07/08/2006)   Pap smear action/deferral: Deferred  (12/04/2009)    Mammogram: Not documented   Mammogram action/deferral: Deferred to age 48  (12/04/2009)   Smoking status: quit  (10/30/2010)  Diabetes Mellitus   HgbA1C: 9.2  (01/18/2011)   Hemoglobin A1C due: 01/30/2011    Eye exam: Normal diabetic eye exam  (09/13/2010)   Diabetic eye exam action/deferral: Ophthalmology referral  (06/06/2010)   Eye exam due: 08/2011    Foot exam: yes  (08/06/2010)   High risk foot: Not documented   Foot care education: Not documented   Foot exam due: 08/07/2011    Urine microalbumin/creatinine ratio: 17.4  (09/20/2008)   Urine microalbumin action/deferral: Not indicated  Lipids   Total Cholesterol: 186   (08/06/2010)   Lipid panel action/deferral: Lipid Panel ordered   LDL: 83  (08/06/2010)   LDL Direct: Not documented   HDL: 42  (08/06/2010)   Triglycerides: 306  (08/06/2010)   Lipid panel due: 09/06/2010  Hypertension   Last Blood Pressure: 138 / 90  (01/18/2011)   Serum creatinine: 0.61  (09/18/2010)   Serum potassium 4.0  (09/18/2010)  Self-Management Support :   Personal Goals (by the next clinic visit) :     Personal A1C goal: 7  (12/04/2009)     Personal blood pressure goal: 140/90  (12/04/2009)     Personal LDL goal: 130  (12/04/2009)    Patient will work on the following items until the next clinic visit to reach self-care goals:     Medications and monitoring: take my medicines every day, examine my feet every day  (01/18/2011)     Eating: eat more vegetables, use fresh or frozen vegetables, eat foods that are low in salt, eat baked foods instead of fried foods, eat fruit for snacks and desserts  (01/18/2011)     Activity: take a 30 minute walk every day  (01/18/2011)     Other: eating less but still eating fried foods - only exercise is standing up at work  (08/06/2010)    Diabetes self-management support: Education handout  (01/18/2011)   Diabetes education handout printed   Last diabetes self-management training by diabetes educator: 06/06/2010    Hypertension self-management support: Education handout  (01/18/2011)   Hypertension education handout printed   Laboratory Results   Blood Tests   Date/Time Received: January 18, 2011 11:20 AM Date/Time Reported: Burke Keels  January 18, 2011 11:20 AM   HGBA1C: 9.2%   (Normal Range: Non-Diabetic - 3-6%   Control Diabetic - 6-8%) CBG Fasting:: 401mg /dL  Comments: CBG results of 401 reported to [facility] NT by Alric Quan, PBT 01-18-11  1101a  Vickie Maxine Glenn  January 18, 2011 11:22 AM

## 2011-02-07 ENCOUNTER — Other Ambulatory Visit: Payer: Self-pay | Admitting: *Deleted

## 2011-02-07 NOTE — Telephone Encounter (Signed)
Medication requested by pharm was discontinued for pt 12/2009

## 2011-02-14 ENCOUNTER — Other Ambulatory Visit (HOSPITAL_COMMUNITY)
Admission: RE | Admit: 2011-02-14 | Discharge: 2011-02-14 | Disposition: A | Discharge status: Home / Self Care | Payer: Self-pay | Source: Ambulatory Visit | Attending: Internal Medicine | Admitting: Internal Medicine

## 2011-02-14 ENCOUNTER — Ambulatory Visit (INDEPENDENT_AMBULATORY_CARE_PROVIDER_SITE_OTHER): Payer: Self-pay | Admitting: Internal Medicine

## 2011-02-14 ENCOUNTER — Encounter: Payer: Self-pay | Admitting: Internal Medicine

## 2011-02-14 VITALS — BP 132/84 | HR 81 | Temp 98.9°F | Wt 240.8 lb

## 2011-02-14 DIAGNOSIS — Z0000 Encounter for general adult medical examination without abnormal findings: Secondary | ICD-10-CM | POA: Insufficient documentation

## 2011-02-14 DIAGNOSIS — E876 Hypokalemia: Secondary | ICD-10-CM

## 2011-02-14 DIAGNOSIS — E669 Obesity, unspecified: Secondary | ICD-10-CM

## 2011-02-14 DIAGNOSIS — E119 Type 2 diabetes mellitus without complications: Secondary | ICD-10-CM

## 2011-02-14 DIAGNOSIS — R197 Diarrhea, unspecified: Secondary | ICD-10-CM

## 2011-02-14 DIAGNOSIS — I10 Essential (primary) hypertension: Secondary | ICD-10-CM

## 2011-02-14 DIAGNOSIS — L293 Anogenital pruritus, unspecified: Secondary | ICD-10-CM

## 2011-02-14 DIAGNOSIS — Z01419 Encounter for gynecological examination (general) (routine) without abnormal findings: Secondary | ICD-10-CM | POA: Insufficient documentation

## 2011-02-14 MED ORDER — INSULIN GLARGINE 100 UNIT/ML ~~LOC~~ SOLN: SUBCUTANEOUS | Status: DC

## 2011-02-14 MED ORDER — FLUCONAZOLE 150 MG PO TABS: 150.0000 mg | ORAL_TABLET | Freq: Once | ORAL | Status: AC

## 2011-02-14 MED ORDER — GLIPIZIDE 10 MG PO TABS: 10.0000 mg | ORAL_TABLET | Freq: Two times a day (BID) | ORAL | Status: DC

## 2011-02-14 MED ORDER — LANCETS MISC: Status: DC

## 2011-02-14 MED ORDER — "INSULIN SYRINGE-NEEDLE U-100 31G X 5/16"" 1 ML MISC": Status: DC

## 2011-02-14 NOTE — Patient Instructions (Signed)
Stop taking metformin.  It may be causing your diarrhea. Start taking insulin (Lantus) 20 units every night.  Record your blood sugars at least 4 times daily and bring to her meter with you to your next appointment.  Please call the clinic if he develops symptoms of sweating, lightheadedness, fatigue, headaches or any signs of low blood sugar because this is dangerous. You should not be having blood sugars less than 60. Please let us know if they have any questions or concerns.

## 2011-02-15 ENCOUNTER — Encounter: Payer: Self-pay | Admitting: Internal Medicine

## 2011-02-15 LAB — BASIC METABOLIC PANEL
BUN: 14 mg/dL (ref 6–23)
CO2: 27 mEq/L (ref 19–32)
Calcium: 10.1 mg/dL (ref 8.4–10.5)
Chloride: 97 mEq/L (ref 96–112)
Creat: 0.66 mg/dL (ref 0.40–1.20)
Glucose, Bld: 233 mg/dL — ABNORMAL HIGH (ref 70–99)
Potassium: 4.2 mEq/L (ref 3.5–5.3)
Sodium: 136 mEq/L (ref 135–145)

## 2011-02-15 NOTE — Assessment & Plan Note (Signed)
This is 2/2 diarrhea, likely from restarting Metformin at 2000mg  daily. Has good po intake. On oral replacement. Will check her bmet today to determine whether or not she needs to chronically.

## 2011-02-15 NOTE — Assessment & Plan Note (Addendum)
AIC 9.2 on orals alone. Does not check blood sugars because she could not afford to pay for strips. However her financial situation has improved, so for today will restart her Lantus. She was previously on 15 units qhs. Given A1C and weight, will start her back on 20units qhs, d/c metformin and continue glipizide at current dose. She was given a script for strips, and she does have a meter at home. She was instructed to record her cbgs 4 times dailym and bring her meter with her on return visit so we can evaluate and adjust her regimen as needed. She was also counseled to watch out for hypoglycemic episodes that include diaphoresis, lightheadedness or dizziness. A referral to [facility] will also be made for additional education. Eye and foot exam are uptodate.

## 2011-02-15 NOTE — Assessment & Plan Note (Signed)
Likely 2/2 restarting Metformin at really high dose - 2000mg  daily for the past 5 days, causing multiple episodes of water diarrhea. Instructed to stop Metformin since she will be restarting insulin. Will also check K today. Continue cholestyramine as needed.

## 2011-02-15 NOTE — Assessment & Plan Note (Signed)
Typical of her yeast infections per patient. KOH and wet prep. Fluconazole x1 dose.

## 2011-02-15 NOTE — Assessment & Plan Note (Signed)
Lost 2 ibs since her last visit. Although admitting to not maintaining a healthy dietary lifestyle, she states she is trying her best. Exercise is lacking. So we discussed the risks and benefits of lifestyle changes. Will continue to follow.

## 2011-02-15 NOTE — Assessment & Plan Note (Addendum)
Pap smear today. Check FLP at next visit. May need to be started on Atorvastatin/ a fibrate given h/o hypertriglyceridemia.

## 2011-02-15 NOTE — Assessment & Plan Note (Signed)
Pressures very well controlled on ACEi. BP 132/84 today. Check BMet to check for Cr level. Otherwise no med changes to be made today.

## 2011-02-15 NOTE — Progress Notes (Signed)
  Subjective:    Patient ID: Amy Norris, female    DOB: 01/30/1966, 45 y.o.   MRN: [id number]  HPI Pt is a very pleasant 45 y/o female with h/o DM2, HTN, depression, and financial difficulty, here on f/u of her DM.  Since I last saw her, she has been doing relatively well, and her husband recently got approved for disability. She has just been recently able to pay for her meds and would like to start back on insulin. She'd been taking metformin, stopped for a while and restarted about 5 days ago (at 2000mg  daily), at which point she reports onset of watery diarrhea - of up to 5 bms in one day. She has had this diarrhea in the past for which she was started on cholestyramine, which helped whenever she could afford it. However, she denied any associated fever, chills, n/v abnormal weight changes, melena or hematochezia. She also reports a 3 day h/o vaginal itching. She states she usually gets yeast infections before or after her periods and that this feels like one of those episodes, however she denies having any discharge, dysuria or odor. She just finished her menses a day prior.  Otherwise, she has no other complaints.  Review of Systems    As per HPI noted above. Objective:   Physical Exam  Constitutional: She is oriented to person, place, and time. She appears well-developed and well-nourished. No distress.  Cardiovascular: Normal rate, regular rhythm and normal heart sounds.  Exam reveals no gallop and no friction rub.   No murmur heard. Pulmonary/Chest: Effort normal and breath sounds normal. No respiratory distress. She has no wheezes. She has no rales.  Abdominal: Soft. Bowel sounds are normal. There is no tenderness.  Genitourinary: Pelvic exam was performed with patient supine. Vaginal discharge found.       No erythema of the vulva or vaginal mucosa. Tiny amount of whitish discharge around the vaginal wall. Not malodorous.   Musculoskeletal: Normal range of motion.  Neurological:  She is alert and oriented to person, place, and time.  Psychiatric: She has a normal mood and affect.          Assessment & Plan:

## 2011-03-07 ENCOUNTER — Ambulatory Visit: Payer: Self-pay | Admitting: Dietician

## 2011-03-07 ENCOUNTER — Ambulatory Visit (INDEPENDENT_AMBULATORY_CARE_PROVIDER_SITE_OTHER): Payer: Self-pay | Admitting: Internal Medicine

## 2011-03-07 ENCOUNTER — Encounter: Payer: Self-pay | Admitting: Internal Medicine

## 2011-03-07 ENCOUNTER — Other Ambulatory Visit: Payer: Self-pay | Admitting: Internal Medicine

## 2011-03-07 DIAGNOSIS — I10 Essential (primary) hypertension: Secondary | ICD-10-CM

## 2011-03-07 DIAGNOSIS — E119 Type 2 diabetes mellitus without complications: Secondary | ICD-10-CM

## 2011-03-07 DIAGNOSIS — Z0000 Encounter for general adult medical examination without abnormal findings: Secondary | ICD-10-CM

## 2011-03-07 DIAGNOSIS — L293 Anogenital pruritus, unspecified: Secondary | ICD-10-CM

## 2011-03-07 LAB — GLUCOSE, CAPILLARY: Glucose-Capillary: 191 mg/dL — ABNORMAL HIGH (ref 70–99)

## 2011-03-07 LAB — COMPREHENSIVE METABOLIC PANEL WITH GFR
ALT: 13 U/L (ref 0–35)
AST: 32 U/L (ref 0–37)
Albumin: 4.2 g/dL (ref 3.5–5.2)
Alkaline Phosphatase: 58 U/L (ref 39–117)
BUN: 15 mg/dL (ref 6–23)
CO2: 28 meq/L (ref 19–32)
Calcium: 9.2 mg/dL (ref 8.4–10.5)
Chloride: 100 meq/L (ref 96–112)
Creat: 0.97 mg/dL (ref 0.40–1.20)
Glucose, Bld: 181 mg/dL — ABNORMAL HIGH (ref 70–99)
Potassium: 4 meq/L (ref 3.5–5.3)
Sodium: 138 meq/L (ref 135–145)
Total Bilirubin: 0.3 mg/dL (ref 0.3–1.2)
Total Protein: 6.6 g/dL (ref 6.0–8.3)

## 2011-03-07 LAB — LIPID PANEL
Cholesterol: 175 mg/dL (ref 0–200)
HDL: 48 mg/dL (ref >39)
Triglycerides: 277 mg/dL — ABNORMAL HIGH (ref <150)
VLDL: 55 mg/dL — ABNORMAL HIGH (ref 0–40)

## 2011-03-07 MED ORDER — LISINOPRIL-HYDROCHLOROTHIAZIDE 20-25 MG PO TABS: 1.0000 | ORAL_TABLET | Freq: Every day | ORAL | Status: DC

## 2011-03-07 MED ORDER — INSULIN GLARGINE 100 UNIT/ML ~~LOC~~ SOLN: SUBCUTANEOUS | Status: DC

## 2011-03-07 NOTE — Assessment & Plan Note (Signed)
Resolved

## 2011-03-07 NOTE — Assessment & Plan Note (Signed)
At goal. Continue current regimen. 

## 2011-03-07 NOTE — Assessment & Plan Note (Addendum)
Patient interested in filling paperwork for Mammogram scholarship. Will check lipid panel and cmet today.

## 2011-03-07 NOTE — Patient Instructions (Signed)
Make follow up appointment in 1 month. Continue taking all medication as directed. You will be called with any abnormalities in the tests scheduled or performed today.  If you don't hear from [place] within a week from when the test was performed, you can assume that your test was normal.

## 2011-03-07 NOTE — Progress Notes (Signed)
  Subjective:    Patient ID: Amy Norris, female    DOB: [birth year], 45 y.o.   MRN: [id number]  HPI  45 yr old woman with Past Medical History  Diagnosis Date  . Bronchitis   . Obesity   . GERD (gastroesophageal reflux disease)   . Diabetes mellitus     Type II  . Depression   . Sleep apnea, obstructive   . Herpes simplex type 1 infection     vaginal  . Menorrhagia   . Carpal tunnel syndrome, bilateral   . Renal calculi   . Hypokalemia     2/2 diarrhea  . Cholelithiasis   . Renal calculus   . Breast mass   . Anal fissure   . Polycystic ovarian disease   . Essential hypertension    comes to the clinic for Diabetes management. Reports that she is using 25 units of Lantus at bedtime. Her blood glucose levels are mostly in the 200's.   Diarrhea has resolved since she was taken off metformin.  HTN: Ran out of Maxzide needs a refill.  Vaginal discharge resolved with Diflucan.   Review of Systems  All other systems reviewed and are negative.       Objective:   Physical Exam  Constitutional: She is oriented to person, place, and time. She appears well-developed and well-nourished. No distress.  Cardiovascular: Normal rate, regular rhythm and normal heart sounds.  Exam reveals no gallop and no friction rub.   No murmur heard. Pulmonary/Chest: Effort normal and breath sounds normal. No respiratory distress. She has no wheezes. She has no rales.  Abdominal: Soft. Bowel sounds are normal. There is no tenderness.  Genitourinary: Pelvic exam was performed with patient supine.  Musculoskeletal: Normal range of motion.  Neurological: She is alert and oriented to person, place, and time.  Psychiatric: She has a normal mood and affect.          Assessment & Plan:

## 2011-03-07 NOTE — Assessment & Plan Note (Addendum)
Meter reviewed. Average glucose reading 219. Lowest am glucose level 163. Will have patient increase Lantus to 30 units sq qhs. Check microalbumin/creatinine ratio. Continue to check blood sugars at least twice a day. Follow up in one month.

## 2011-03-08 LAB — MICROALBUMIN / CREATININE URINE RATIO
Creatinine, Urine: 99.7 mg/dL
Microalb, Ur: 0.5 mg/dL (ref 0.00–1.89)

## 2011-03-12 LAB — GLUCOSE, CAPILLARY: Glucose-Capillary: 201 mg/dL — ABNORMAL HIGH (ref 70–99)

## 2011-03-17 LAB — GLUCOSE, CAPILLARY: Glucose-Capillary: 216 mg/dL — ABNORMAL HIGH (ref 70–99)

## 2011-03-18 LAB — GLUCOSE, CAPILLARY
Glucose-Capillary: 230 mg/dL — ABNORMAL HIGH (ref 70–99)
Glucose-Capillary: 349 mg/dL — ABNORMAL HIGH (ref 70–99)

## 2011-04-16 ENCOUNTER — Ambulatory Visit: Payer: Self-pay | Admitting: Internal Medicine

## 2011-04-30 ENCOUNTER — Other Ambulatory Visit: Payer: Self-pay | Admitting: Internal Medicine

## 2011-04-30 DIAGNOSIS — E119 Type 2 diabetes mellitus without complications: Secondary | ICD-10-CM

## 2011-05-14 NOTE — Op Note (Signed)
NAME:  [name], Amy Norris              ACCOUNT NO.:  [id number]   MEDICAL RECORD NO.:  [id number]          PATIENT TYPE:  AMB   LOCATION:  DAY                          FACILITY:  [facility]   PHYSICIAN:  Alfonse Ras, MD   DATE OF BIRTH:  Feb 09, 1966   DATE OF PROCEDURE:  11/24/2007  DATE OF DISCHARGE:                               OPERATIVE REPORT   PREOPERATIVE DIAGNOSIS:  Symptomatic cholelithiasis.   POSTOPERATIVE DIAGNOSIS:  Symptomatic cholelithiasis with normal  cholangiogram.   PROCEDURE:  Laparoscopic cholecystectomy with intraoperative  cholangiogram.   SURGEON:  Alfonse Ras, M.D.   ASSISTANT:  Leonie Man, M.D.   ANESTHESIA:  General.   DESCRIPTION:  The patient was taken to the operating room, placed in the  supine position.  After adequate general anesthesia was induced using an  endotracheal tube, the abdomen was prepped and draped in the normal  sterile fashion.  Using a transverse infraumbilical incision, I  dissected down to the fascia.  The fascia was opened vertically.  An 0  Vicryl pursestring suture was placed around the fascial defect.  Hassan  trocar was placed in the abdomen, and pneumoperitoneum was obtained.  Under direct vision, an 11 mm trocar was placed in the subxiphoid region  and two 5 mm trocars were placed in the right abdomen.  The gallbladder  was identified and retracted cephalad.  Dissecting down from the neck of  the gallbladder, the cystic duct was visualized and dissected.  There  was a critical view obtained, and it was clipped proximally near the  gallbladder.  A small ductotomy was made and Reddick catheter was  inserted distally.  Cholangiogram was performed, which showed free flow  into the duodenum, normal common bile duct diameter, and free flow into  the right and left hepatic ducts.  The catheter was then removed, and  the cystic duct was triply clipped and divided.  Cystic artery was also  dissected in a similar fashion,  triply clipped, and divided.  The  gallbladder was taken off the gallbladder bed using Bovie electrocautery  and placed in EndoCatch bag.  Bleeding was controlled with  electrocautery.  The gallbladder was then removed through the umbilical  port.  Off note, there was a quite large stone impacted in the  infundibulum.  Adequate hemostasis was ensured.  The right upper  quadrant was copiously irrigated.  The infraumbilical fascial defect was  closed with Vicryl suture.  Trocars were removed after pneumoperitoneum  was released, and skin incisions were closed with subcuticular 4-0  Monocryl and injected with 0.5 Marcaine.  Steri-Strips and sterile  dressings were applied.  The patient tolerated the procedure well and  went to PACU in good condition.      Alfonse Ras, MD  Electronically Signed     KRE/MEDQ  D:  11/24/2007  T:  11/24/2007  Job:  [id number]

## 2011-05-17 NOTE — Procedures (Signed)
NAME:  Amy Norris, Amy Norris              ACCOUNT NO.:  [id number]   MEDICAL RECORD NO.:  [id number]          PATIENT TYPE:  OUT   LOCATION:  SLEEP CENTER                 FACILITY[facility]  [facility]   PHYSICIAN:  Clinton D. Maple Hudson, M.D. DATE OF BIRTH:  10/18/1966   DATE OF STUDY:  01/23/2005                              NOCTURNAL POLYSOMNOGRAM   INDICATIONS FOR STUDY:  Insomnia with sleep apnea. Epworth sleepiness score  14/24, BMI 46, weight 290 pounds.   SLEEP ARCHITECTURE:  Total sleep time 329 minutes with sleep efficiency of  72%. Stage I was 7%, stage II was 54%, stages III and IV were 14%, REM was  26% of total sleep time. Latency to sleep onset 103 minutes. Latency to REM  106 minutes. Awake after sleep onset 21 minutes. Arousal index 13. No  medications were taken.   RESPIRATORY DATA:  Split study protocol. RDI 34 per hour indicating moderate  obstructive sleep apnea/hypopnea syndrome before CPAP. This included 12  obstructive apneas and 68 hypopneas before CPAP. The events were especially  associated with supine sleep position, but were seen in all sleep positions.  REM RDI 72 per hour. CPAP was titrated to 15 CWP, RDI 0 per hour. A medium  full face Respironics comfort mask was used with humidifier.   OXYGEN DATA:  Moderate snoring with oxygen desaturation to a nadir of 77%.  After CPAP titration saturation held 95% to 98% on room air.   CARDIAC DATA:  Sinus rhythm with occasional PVC.   MOVEMENT/PARASOMNIA:  Occasional leg jerk with little effect on sleep.   IMPRESSION/RECOMMENDATIONS:  1.  Moderately severe obstructive sleep apnea/hypopnea syndrome, respiratory      disturbance index 34 per hour with  oxygen desaturation to 77%.  2.  CPAP titration to 15 CWP, RDI 0 per hour using a medium Respironics      Comfort full facemask with heated humidifier.      CDY/MEDQ  D:  01/27/2005 11:44:05  T:  01/27/2005 18:49:50  Job:  [id number]

## 2011-05-28 ENCOUNTER — Ambulatory Visit: Payer: Self-pay | Admitting: Internal Medicine

## 2011-08-08 ENCOUNTER — Encounter: Payer: Self-pay | Admitting: Internal Medicine

## 2011-08-21 ENCOUNTER — Ambulatory Visit (INDEPENDENT_AMBULATORY_CARE_PROVIDER_SITE_OTHER): Payer: Self-pay | Admitting: Internal Medicine

## 2011-08-21 ENCOUNTER — Encounter: Payer: Self-pay | Admitting: Internal Medicine

## 2011-08-21 VITALS — BP 127/86 | HR 71 | Temp 97.7°F | Ht 67.0 in | Wt 244.2 lb

## 2011-08-21 DIAGNOSIS — N760 Acute vaginitis: Secondary | ICD-10-CM

## 2011-08-21 DIAGNOSIS — E119 Type 2 diabetes mellitus without complications: Secondary | ICD-10-CM

## 2011-08-21 DIAGNOSIS — Z23 Encounter for immunization: Secondary | ICD-10-CM

## 2011-08-21 DIAGNOSIS — I10 Essential (primary) hypertension: Secondary | ICD-10-CM

## 2011-08-21 MED ORDER — GLIPIZIDE 10 MG PO TABS: 10.0000 mg | ORAL_TABLET | Freq: Two times a day (BID) | ORAL | Status: DC

## 2011-08-21 MED ORDER — FLUCONAZOLE 150 MG PO TABS: 150.0000 mg | ORAL_TABLET | Freq: Once | ORAL | Status: AC

## 2011-08-21 MED ORDER — INSULIN GLARGINE 100 UNIT/ML ~~LOC~~ SOLN: SUBCUTANEOUS | Status: DC

## 2011-08-21 MED ORDER — PNEUMOCOCCAL VAC POLYVALENT 25 MCG/0.5ML IJ INJ: 0.5000 mL | INJECTION | Freq: Once | INTRAMUSCULAR | Status: DC

## 2011-08-21 NOTE — Progress Notes (Signed)
Subjective:    Patient ID: Amy Norris, female    DOB: July 23, 1966, 46 y.o.   MRN: [id number]  HPI: The patient is a 45 year old female who comes in today for vaginal itching and burning. She states that she has had these symptoms for some time now. She normally gets pruritus several days before her period usually it resolves when she gets her period. However before her last period, which was last week, she did not have resolution of the itching with her period. She states that she has not been taking her blood pressure medication for approximately 6 months now, she has also not been taking any medication for the last 3-4 months. She is supposed to be taking glipizide, 30 units of Lantus nightly, lisinopril/HCTZ, KCL. She denies any fevers or chills at home she denies any burning on urination or defecation she denies any vaginal discharge. She does state that she takes very hot showers or baths to try to alleviate this burning. She states that sometimes she will itch so much that she gets bleeding from skin breakdown. There is no skin breakdown at this time. She states that she has been trying really hard not to itch over the last week or 2.   Review of Systems  Constitutional: Negative for fever, chills, activity change, appetite change and fatigue.  HENT: Negative.   Eyes: Negative.   Respiratory: Negative.   Cardiovascular: Negative.   Gastrointestinal: Negative.  Negative for nausea, vomiting, abdominal pain, diarrhea, constipation, blood in stool, abdominal distention, anal bleeding and rectal pain.  Genitourinary: Negative for dysuria, urgency, frequency, hematuria, flank pain, decreased urine volume, vaginal bleeding, vaginal discharge, enuresis, difficulty urinating, genital sores, vaginal pain, menstrual problem, pelvic pain and dyspareunia.       Patient is having pruritus around the vaginal area.  Musculoskeletal: Negative.   Skin: Positive for rash.  Neurological: Negative.     Hematological: Negative.   Psychiatric/Behavioral: Negative.        Objective:   Physical Exam  Constitutional: She is oriented to person, place, and time. She appears well-developed and well-nourished.  HENT:  Head: Normocephalic and atraumatic.  Eyes: EOM are normal. Pupils are equal, round, and reactive to light.  Neck: Normal range of motion. Neck supple. No tracheal deviation present. No thyromegaly present.  Cardiovascular: Regular rhythm.   Pulmonary/Chest: Effort normal and breath sounds normal. No respiratory distress. She has no wheezes. She has no rales.  Abdominal: Soft. Bowel sounds are normal. She exhibits no distension. There is no tenderness. There is no rebound and no guarding.  Genitourinary: No labial fusion. There is rash on the right labia. There is no tenderness, lesion or injury on the right labia. There is rash on the left labia. There is no tenderness, lesion or injury on the left labia. There is erythema around the vagina. No tenderness or bleeding around the vagina. No foreign body around the vagina. No signs of injury around the vagina. Vaginal discharge found.       The vagina was fairly red, and there was slight white discharge. The skin on the outside surrounding the labia was kind of dry and reddened.  Lymphadenopathy:    She has no cervical adenopathy.  Neurological: She is alert and oriented to person, place, and time. No cranial nerve deficit.  Skin: Skin is warm and dry.  Psychiatric: She has a normal mood and affect. Her behavior is normal. Judgment and thought content normal.  Assessment & Plan:  1. Vaginal pruritus-probably due to chronic yeast infection. I did speak extensively with the patient about this the likelihood of recurrence if she does not get her blood sugars under control. Her hemoglobin A1c at today's visit is 10.2. She has not been taking her medication for several months. I did stress to her the importance of starting  medications for her diabetes again. She did seem to understand, this was not something that she had prior associated with these yeast infections. It did seem to make sense to her. I also discussed this with her husband and he seemed similarly to be understanding. There was little to no discharge on vaginal exam, there was redness to the interior of the vagina as well on the outside of the skin from itching. The skin did appear to be slightly dry. I did encourage her strongly not to itch. I did give her fluconazole 150 mg to be taken once today once on Saturday once the following Tuesday and then every Tuesday for 6 weeks. We did check a urinalysis at today's visit, I will call her back if the results are abnormal. She will be seen back with her regular doctor on her next available appointment. Her regular doctor is Dr. Anselm Jungling.  2. Please see problem-oriented charting.  3. This position-patient was given fluconazole prescription, Pneumovax today, hemoglobin A1c checked today. She'll be seen back as with next available appointment with Dr. Anselm Jungling. At this time please discuss management of her chronic medical issues and encourag her to continue taking her medications. She states that she does not qualify for the orange card at this time due to her husband getting on disability and getting a lump sum of money. They do have too much money to qualify at this time. She said that after the first of the year they would qualify I did encourage her strongly to get that paperwork in as soon as possible as soon as they did qualify.

## 2011-08-21 NOTE — Patient Instructions (Addendum)
You were seen today for itching and burning around your vagina. You have a chronic yeast infection. This hasn't gone away since your sugars have been high lately. You need to start taking your diabetes medicines again. If your sugars continue to be high your yeast infection will probably return. Come back and see your regular doctor, Dr. Anselm Jungling in about 1 month. If you are having any problems with your medicines or your sugars you can call our office anytime. Our number is [phone]. You received a pneumonia vaccine today. This was given because your diabetes makes you high risk for pneumonia. You only receive this shot one time, and then again after you turn 65 one time more. If you have any reactions at the site that do not go away in the next 2-3 days please call our office.   Diabetes, Type 2 Diabetes is a lasting (chronic) disease. It occurs when the body does not properly use the sugar (glucose) that is released from food. Glucose levels are controlled by a hormone called insulin. Insulin is made by your pancreas. In type 2 diabetes, the pancreas is making less insulin and the body has trouble using the insulin properly. HOME CARE   Check your blood glucose (sugar) once a day.   Follow your treatment and monitoring plan.   Take your medicine as directed by your doctor.   Do not smoke.   Eat healthy. Weight loss can improve your diabetes.   Know what low blood glucose (hypoglycemia) is. Know how to treat it.   Get your eyes checked regularly.   Have a yearly physical exam.   Have your blood pressure checked.   Get your blood and urine tested.   Wear a bracelet that says you have diabetes.   Check your feet for sores every night. Tell your doctor if sores are not healing.  GET HELP RIGHT AWAY IF YOU:  Have trouble keeping your blood glucose in target range.   Have problems with your medicines.   Are sick and not getting better after 24 hours.   Have a sore or wound that is  not healing.   Have a problem or change in your vision.   Develop fever of more than 100.5 F (38.1 C).  Document Released: 09/24/2008 Document Re-Released: 10/13/2009 [facility] Patient Information 2011 [place], [place].  Vaginitis Vaginitis in a soreness, swelling and redness (inflammation) of the vagina and vulva. This is not a sexually transmitted infection.   CAUSES Yeast vaginitis is caused by yeast (candida) that is normally found in your vagina. With a yeast infection, the candida has over grown in number to a point that upsets the chemical balance. SYMPTOMS   White thick vaginal discharge.   Swelling, itching, redness and irritation of the vagina and possibly the lips of the vagina (vulva).   Burning or painful urination.   Painful intercourse.  HOME CARE INSTRUCTIONS  Finish all medication as prescribed.   Do not have sex until treatment is completed or instructed by your healthcare giver.   Take warm sitz baths.   Do not douche.   Do not use tampons, especially scented ones.   Wear cotton underwear.   Avoid tight pants and panty hose.   Tell your sexual partner that you have a yeast infection. They should go to their caregiver if they have symptoms such as mild rash or itching.   Your sexual partner should be treated if your infection is difficult to eliminate.   Practice safer sex.  Use condoms.   Some vaginal medications cause latex condoms to fail. Ask your caregiver this.  SEEK MEDICAL CARE IF:  You develop a fever.   The infection is getting worse after 2 days of treatment.   The infection is not getting better after 3 days of treatment.   You develop blisters in or around your vagina.   You develop vaginal bleeding, and it is not your menstrual period.   You have pain when you urinate.   You develop intestinal problems.   You have pain with sexual intercourse.  Document Released: 01/23/2005 Document Re-Released: 10/13/2009 [facility] Patient  Information 2011 [place], [place].

## 2011-08-21 NOTE — Assessment & Plan Note (Signed)
Per patient she has been off her blood pressure medication for approximately 6 months now her blood pressure today is 121/86. At this time she is continuing off medications. Please rediscuss at next visit.

## 2011-08-21 NOTE — Assessment & Plan Note (Signed)
Patient has not been testing her blood sugar due to lack of having strips. She has not taken any diabetes medicines for approximately 3-4 months. I did advise her that this chronic yeast infection will probably not fully resolve until her blood sugars are under control. I did advise her that she does need to be on medication for her blood sugar. She stated that she would get her medications filled this weekend possibly. Please rediscuss with her at next visit her usage of diabetic medications and encourage usage of these medications. I did ask her if she wished to speak to Jamison Neighbor. She did decline. I did administer Pneumovax at today's visit. Hemoglobin A1c was checked and was 10.2 at today's visit. I did discuss the seriousness of this finding with her and her husband and they did not seem to be understanding. Hopefully she will start to take medications in the future. She'll be seen back in approximately one month with Dr. Anselm Jungling for close followup. I did advise her if she continues to have symptoms or she has new symptoms she is free to call our office at any time, and I did give her our number.

## 2011-08-22 LAB — URINALYSIS, MICROSCOPIC ONLY
Bacteria, UA: NONE SEEN
Casts: NONE SEEN
Crystals: NONE SEEN

## 2011-08-22 LAB — URINALYSIS, ROUTINE W REFLEX MICROSCOPIC
Bilirubin Urine: NEGATIVE
Glucose, UA: >1000 mg/dL — AB
Ketones, ur: NEGATIVE mg/dL
Nitrite: NEGATIVE
Specific Gravity, Urine: 1.041 — ABNORMAL HIGH (ref 1.005–1.030)
pH: 5.5 (ref 5.0–8.0)

## 2011-09-24 ENCOUNTER — Ambulatory Visit (INDEPENDENT_AMBULATORY_CARE_PROVIDER_SITE_OTHER): Payer: Self-pay | Admitting: Internal Medicine

## 2011-09-24 VITALS — BP 145/90 | HR 71 | Temp 98.1°F | Ht 68.0 in | Wt 251.2 lb

## 2011-09-24 DIAGNOSIS — E119 Type 2 diabetes mellitus without complications: Secondary | ICD-10-CM

## 2011-09-24 DIAGNOSIS — L293 Anogenital pruritus, unspecified: Secondary | ICD-10-CM

## 2011-09-24 DIAGNOSIS — I10 Essential (primary) hypertension: Secondary | ICD-10-CM

## 2011-09-24 MED ORDER — LISINOPRIL-HYDROCHLOROTHIAZIDE 10-12.5 MG PO TABS: 1.0000 | ORAL_TABLET | Freq: Every day | ORAL | Status: DC

## 2011-09-24 MED ORDER — INSULIN NPH ISOPHANE & REGULAR (70-30) 100 UNIT/ML ~~LOC~~ SUSP: SUBCUTANEOUS | Status: DC

## 2011-09-24 MED ORDER — GLUCOSE BLOOD VI STRP: ORAL_STRIP | Status: DC

## 2011-09-24 NOTE — Progress Notes (Signed)
HPI: 45 yo W with complex medical history present for follow up.  She states that her itching has resolved and still has 3 more tablets of Fluconazole left.  She restarted taking some of her medications since last office visit include metformin and Lantus?  She told nurse that she has been taking some insulin from a friend and does not know that name of it.  Patient states that Lantus is expensive and she is not able to afford it.  She has been taking her old Metformin instead of Glucotrol and has been having some GI issues (which was the reason why she was taken off of Metformin in the past). She has not been checking her sugar at home because she does not have test strips and would like for a prescription today.   Otherwise, she is doing well and has no complaints today.  Denies any fever, chills, n/v, numbness or tingling.     Last Pap smear was 01/2011: negative No mammogram since 2008 because she does not have insurance and cannot get her orange card until Jan 2013. She does have a family hx of breast cancer Influenza vaccine: administered today  ROS: as per HPI  General: alert, well-developed, and cooperative to examination.  Lungs: normal respiratory effort, no accessory muscle use, normal breath sounds, no crackles, and no wheezes. Heart: normal rate, regular rhythm, no murmur, no gallop, and no rub.  Abdomen: soft, non-tender, normal bowel sounds, no distention, no guarding, no rebound tenderness Msk: no joint swelling, no joint warmth, and no redness over joints.  Pulses: 2+ DP/PT pulses bilaterally Extremities: No cyanosis, clubbing, edema, no ulcers noted on LE Neurologic: alert & oriented X3, cranial nerves II-XII intact, strength normal in all extremities, sensation intact to light touch, and gait normal.  Skin: turgor normal and no rashes.  Psych: Oriented X3, memory intact for recent and remote, normally interactive, good eye contact, not anxious appearing, and not depressed  appearing.

## 2011-09-24 NOTE — Assessment & Plan Note (Addendum)
Not well controlled,  CBG in office today was 200.  HbA1c in 07/2011 was 10.2.  Apparently, patient has been taking some kind of insulin at home that was given to her by a friend since she cannot afford Lantus.  In addition, she has been taking her old Metformin instead of Glucotrol as prescribed. -Instructed patient not to take other people's medication as it is very dangerous for her -Stop Lantus and start Novolin ([facility]) 70/30 take 10 units in AM and 10 units in PM, within 15 minutes of meal -Check CBGs 2-4 times daily and bring meter to office visit next time so I can adjust her insulin accordingly -Advised on diet and weightloss which can help lower her HbA1c -Restart Lisinopril/hctz for BP and kidney protection -Continue Glucotrol 10mg  bid -Lipid panel is ok with LDL of 72 but triglyceride is 277, will not start statin at this time since her LDL is at goal.  Patient has financial issue which prevents her from buying a lot of medications. -Follow up in 2 weeks -Rx for test strip, Novolin, hctz/lisinopril were sent to patient's pharmacy

## 2011-09-24 NOTE — Assessment & Plan Note (Signed)
Resolved.  Will continue to take the last 3 tablets of fluconazole.  I discussed with patient and her husband extensively about preventing future yeast infection by control her blood sugar.   -Will give fluconazole if she gets yeast infection again, but I expect her yeast infection to resolve as she control her BS well.

## 2011-09-24 NOTE — Assessment & Plan Note (Signed)
Not at goal.  Will restart lisinopril-hctz at a lower dose 10/12.5mg  today, repeat BP in 2 wks

## 2011-09-24 NOTE — Patient Instructions (Addendum)
Please start taking Lisinopril-hctz 10/12.5mg  one tablet daily Take Glucotrol 10mg  twice daily Take Novolin 70/30, 10 units in AM and 10 units at night,  Within 15 minutes of meal Check your blood sugar 2-4 times daily and bring your meter to next office visit so I can adjust your medication accordingly Do not take other people medication because it can be very dangerous for you Follow up with Dr. Anselm Jungling in 2 weeks

## 2011-09-30 LAB — WET PREP, GENITAL
Clue Cells Wet Prep HPF POC: NONE SEEN
WBC, Wet Prep HPF POC: NONE SEEN

## 2011-09-30 LAB — GC/CHLAMYDIA PROBE AMP, GENITAL: Chlamydia, DNA Probe: NEGATIVE

## 2011-10-01 ENCOUNTER — Ambulatory Visit (INDEPENDENT_AMBULATORY_CARE_PROVIDER_SITE_OTHER): Payer: Self-pay | Admitting: Internal Medicine

## 2011-10-01 DIAGNOSIS — J069 Acute upper respiratory infection, unspecified: Secondary | ICD-10-CM

## 2011-10-01 DIAGNOSIS — I10 Essential (primary) hypertension: Secondary | ICD-10-CM

## 2011-10-01 DIAGNOSIS — E119 Type 2 diabetes mellitus without complications: Secondary | ICD-10-CM

## 2011-10-01 MED ORDER — BENZONATATE 100 MG PO CAPS: 100.0000 mg | ORAL_CAPSULE | Freq: Three times a day (TID) | ORAL | Status: AC | PRN

## 2011-10-01 NOTE — Progress Notes (Signed)
HPI: Ms. Egler is a 45 yo W with DM, HTN, depression presents today for BP follow up and cough.  She reports a dry cough in the past 2 days without any sputum production, denies any fever, chills, n/v or any other systemic symptoms.  She states that she normally has bronchitis during the fall/winter seasons and that can't medication with codeine helped with her cough in the past.  Patient has not started taking the Novolin as prescribed because she does not have her paycheck yet. She has been taking her friend's insulin as well as the Glucotrol 10 mg twice a day. She also reports fullness in her left ear that is associated with a cough in the past 2 days.  She used some Q-tips in the left ear because she thought there was some wax. She denies any ear discharge or pain. No other complaints today.  Review of system: As per history of present illness  Physical examination: General: alert, well-developed, and cooperative to examination.  Mouth: no erythema or exudate noted Ear: small amount of dry old blood on external canal; otherwise, no tenderness or erythema or drainage of left ear, TM wnl.  Right ear: wnl  Lungs: normal respiratory effort, no accessory muscle use, normal breath sounds, no crackles, and no wheezes. Heart: normal rate, regular rhythm, no murmur, no gallop, and no rub.  Abdomen: obese, soft, non-tender, normal bowel sounds, no distention, no guarding, no rebound tenderness Msk: no joint swelling, no joint warmth, and no redness over joints.  Pulses: 2+ DP/PT pulses bilaterally Extremities: No cyanosis, clubbing, edema Neurologic: alert & oriented X3, cranial nerves II-XII intact, strength normal in all extremities, sensation intact to light touch, and gait normal.  Psych: Oriented X3, memory intact for recent and remote, normally interactive, good eye contact, not anxious appearing, and not depressed appearing.

## 2011-10-01 NOTE — Patient Instructions (Signed)
Take Benzonatate 100mg  one capsule 3 times daily as needed for cough, if worsen or if you start to develop fever, chills, greenish sputum, please call our office or return to clinic for further evaluation Drink plenty of fluid May take Sudafed over the counter for congestion Please take your insulin as prescribed and Glucotrol twice daily Follow up with Dr. Anselm Jungling in 1-2 month

## 2011-10-01 NOTE — Assessment & Plan Note (Signed)
Well controlled. Will continue with current regimen of lisinopril/hydrochlorothiazide 10/12.5 mg by mouth daily

## 2011-10-01 NOTE — Assessment & Plan Note (Addendum)
Cough:Likely bronchitis/viral infection. Patient does not have any fevers, productive sputum, or chills. -Will prescribe Tessalon 100 mg by mouth 3 times a day when necessary cough -Patient was instructed to call the office or return for further evaluation if her symptoms worsen or if she develops fevers, productive sputum, or increase in cough. I will prescribe her a short course of antibiotic (Zpak) if her symptoms do not resolve in about 7-10 days. -Patient may take Sudafed over-the-counter for her congestion -Case discussed with attending, Dr. Phillips Odor

## 2011-10-08 LAB — URINALYSIS, ROUTINE W REFLEX MICROSCOPIC
Bilirubin Urine: NEGATIVE
Glucose, UA: >1000 — AB
Ketones, ur: NEGATIVE
pH: 6

## 2011-10-08 LAB — COMPREHENSIVE METABOLIC PANEL
Alkaline Phosphatase: 60
BUN: 9
Calcium: 9.3
Glucose, Bld: 255 — ABNORMAL HIGH
Potassium: 4.3
Total Protein: 6.5

## 2011-10-08 LAB — CBC
Hemoglobin: 14.4
RBC: 4.69
RDW: 12.9

## 2011-10-08 LAB — DIFFERENTIAL
Basophils Relative: 0
Monocytes Relative: 6
Neutro Abs: 4.2
Neutrophils Relative %: 62

## 2011-10-08 LAB — URINE MICROSCOPIC-ADD ON

## 2011-10-11 LAB — CBC
HCT: 40.4
Hemoglobin: 14
RBC: 4.65
WBC: 9.2

## 2011-10-11 LAB — DIFFERENTIAL
Eosinophils Relative: 1
Lymphocytes Relative: 22
Lymphs Abs: 2
Monocytes Absolute: 0.4
Monocytes Relative: 5

## 2011-10-11 LAB — URINE MICROSCOPIC-ADD ON

## 2011-10-11 LAB — BASIC METABOLIC PANEL
GFR calc non Af Amer: >60
Glucose, Bld: 241 — ABNORMAL HIGH
Potassium: 3.9
Sodium: 138

## 2011-10-11 LAB — HEPATIC FUNCTION PANEL
ALT: 41 — ABNORMAL HIGH
AST: 107 — ABNORMAL HIGH
Alkaline Phosphatase: 72
Bilirubin, Direct: 0.3
Indirect Bilirubin: 0.6
Total Protein: 6.6

## 2011-10-11 LAB — URINALYSIS, ROUTINE W REFLEX MICROSCOPIC
Bilirubin Urine: NEGATIVE
Specific Gravity, Urine: 1.027
pH: 6.5

## 2011-10-11 LAB — POCT CARDIAC MARKERS: Operator id: 4761

## 2011-10-24 ENCOUNTER — Emergency Department (HOSPITAL_COMMUNITY)
Admission: EM | Admit: 2011-10-24 | Discharge: 2011-10-24 | Disposition: A | Discharge status: Home / Self Care | Payer: Self-pay | Attending: Emergency Medicine | Admitting: Emergency Medicine

## 2011-10-24 DIAGNOSIS — X58XXXA Exposure to other specified factors, initial encounter: Secondary | ICD-10-CM | POA: Insufficient documentation

## 2011-10-24 DIAGNOSIS — IMO0002 Reserved for concepts with insufficient information to code with codable children: Secondary | ICD-10-CM | POA: Insufficient documentation

## 2011-10-24 DIAGNOSIS — L03019 Cellulitis of unspecified finger: Secondary | ICD-10-CM | POA: Insufficient documentation

## 2011-10-24 DIAGNOSIS — Z794 Long term (current) use of insulin: Secondary | ICD-10-CM | POA: Insufficient documentation

## 2011-10-24 DIAGNOSIS — Z79899 Other long term (current) drug therapy: Secondary | ICD-10-CM | POA: Insufficient documentation

## 2011-10-24 DIAGNOSIS — I10 Essential (primary) hypertension: Secondary | ICD-10-CM | POA: Insufficient documentation

## 2011-10-24 DIAGNOSIS — E119 Type 2 diabetes mellitus without complications: Secondary | ICD-10-CM | POA: Insufficient documentation

## 2011-10-24 DIAGNOSIS — M7989 Other specified soft tissue disorders: Secondary | ICD-10-CM | POA: Insufficient documentation

## 2011-10-24 DIAGNOSIS — L02519 Cutaneous abscess of unspecified hand: Secondary | ICD-10-CM | POA: Insufficient documentation

## 2011-10-24 DIAGNOSIS — M79609 Pain in unspecified limb: Secondary | ICD-10-CM | POA: Insufficient documentation

## 2011-11-19 ENCOUNTER — Encounter: Payer: Self-pay | Admitting: Internal Medicine

## 2011-11-28 ENCOUNTER — Other Ambulatory Visit: Payer: Self-pay | Admitting: Family Medicine

## 2011-12-24 ENCOUNTER — Other Ambulatory Visit: Payer: Self-pay | Admitting: Internal Medicine

## 2012-01-14 ENCOUNTER — Other Ambulatory Visit: Payer: Self-pay | Admitting: Family Medicine

## 2012-01-14 NOTE — Telephone Encounter (Signed)
Refill request

## 2012-01-19 ENCOUNTER — Other Ambulatory Visit: Payer: Self-pay | Admitting: Family Medicine

## 2012-03-05 ENCOUNTER — Encounter: Payer: Self-pay | Admitting: Internal Medicine

## 2012-03-05 ENCOUNTER — Ambulatory Visit (INDEPENDENT_AMBULATORY_CARE_PROVIDER_SITE_OTHER): Payer: Self-pay | Admitting: Internal Medicine

## 2012-03-05 VITALS — BP 144/86 | HR 70 | Temp 98.4°F | Wt 261.0 lb

## 2012-03-05 DIAGNOSIS — E781 Pure hyperglyceridemia: Secondary | ICD-10-CM

## 2012-03-05 DIAGNOSIS — Z0000 Encounter for general adult medical examination without abnormal findings: Secondary | ICD-10-CM

## 2012-03-05 DIAGNOSIS — N63 Unspecified lump in unspecified breast: Secondary | ICD-10-CM

## 2012-03-05 DIAGNOSIS — I10 Essential (primary) hypertension: Secondary | ICD-10-CM

## 2012-03-05 DIAGNOSIS — Z139 Encounter for screening, unspecified: Secondary | ICD-10-CM

## 2012-03-05 DIAGNOSIS — E119 Type 2 diabetes mellitus without complications: Secondary | ICD-10-CM

## 2012-03-05 DIAGNOSIS — Z79899 Other long term (current) drug therapy: Secondary | ICD-10-CM

## 2012-03-05 MED ORDER — LISINOPRIL-HYDROCHLOROTHIAZIDE 10-12.5 MG PO TABS: 1.0000 | ORAL_TABLET | Freq: Every day | ORAL | Status: DC

## 2012-03-05 MED ORDER — LISINOPRIL 10 MG PO TABS: 10.0000 mg | ORAL_TABLET | Freq: Every day | ORAL | Status: DC

## 2012-03-05 MED ORDER — INSULIN NPH ISOPHANE & REGULAR (70-30) 100 UNIT/ML ~~LOC~~ SUSP: SUBCUTANEOUS | Status: DC

## 2012-03-05 NOTE — Assessment & Plan Note (Signed)
In 2008 patient did have a mammogram which shows a oil cyst. She does have a strong family history of breast cancer in her mother and her aunt. I think it is very important that patient followup with her mammogram yearly and she understands that. Patient is very concerned because she does not have money to pay for lab tests or imaging. -Will schedule for mammogram

## 2012-03-05 NOTE — Patient Instructions (Signed)
Increase Insulin to 35units twice daily, you may titrate up 2 units every 5 days if your fasting blood sugar in the morning is still above 150 Continue Glipizide 10mg  twice daily Start taking Lisinopril 10mg  one tablet daily Get Mammogram  Follow up with Dr. Anselm Jungling in 3-6 months

## 2012-03-05 NOTE — Assessment & Plan Note (Signed)
Not at goal. Blood pressure on arrival was 154/85 however repeat blood pressure was 144/86. My goal for her would be less than 130/80. Given that she also has diabetes, I think it is important for her to resume her ACE inhibitor. -Start lisinopril 10 mg by mouth daily

## 2012-03-05 NOTE — Progress Notes (Signed)
HPI: Amy Norris is a 46 year old woman with past medical history of diabetes type 2 and hypertension presents today for routine followup. She has been doing well and does not have any specific complaints except for occasional numbness and tingling in her toes. As for her diabetes, patient has titrated her insulin 70/30 to 30 units twice daily along with glipizide 10 mg twice daily. She denies any symptoms of hypoglycemia and that her CBG usually range between 90 to 300s with an average of 216. She last saw [facility] in September 2012. Denies any visual problems. No patient has not had a mammogram since 2008 because of lack of insurance. However she does report a strong family history of breast cancer in her mother and her aunt. She denies any breast lumps or nodules. As for her hypertension, patient has not been taking any medications. She wants to defer lab works today because of financial issues.  ROS: Denies any fever, abdominal pain, n/v, chest pain or SOB  PE: General: alert, well-developed, and cooperative to examination.  Lungs: normal respiratory effort, no accessory muscle use, normal breath sounds, no crackles, and no wheezes. Heart: normal rate, regular rhythm, no murmur, no gallop, and no rub.  Abdomen: Obese, soft, non-tender, normal bowel sounds, no distention, no guarding, no rebound tenderness, no hepatomegaly, and no splenomegaly.  Msk: no joint swelling, no joint warmth, and no redness over joints.  Pulses: 2+ DP/PT pulses bilaterally Extremities: No cyanosis, clubbing, edema Neurologic: alert & oriented X3, cranial nerves II-XII intact, strength normal in all extremities, sensation intact to light touch, and gait normal.  Skin: turgor normal and no rashes.  Psych: Oriented X3, memory intact for recent and remote, normally interactive, good eye contact, not anxious appearing, and not depressed appearing.

## 2012-03-05 NOTE — Assessment & Plan Note (Addendum)
-  Will defer basic labs today due to insurance/financial problem -Will schedule for a mammogram -Pap smear was done in 01/2011 which was negative, not due until February of 2015

## 2012-03-05 NOTE — Assessment & Plan Note (Addendum)
Significant improvement. Hemoglobin A1c trended down from 10.2 to 7.7 today. Feet exam was within normal limit. I still would like her hemoglobin A1c to be below 7 but preferably below 6; therefore I think she need to increase her insulin a little bit more. -Instructed patient to titrate up her Relion 70/30 : 2 units every 5 day with a goal of fasting blood glucose between 70-150. She was also instructed to titrate down her insulin should she become hypoglycemic i.e. less than 70. She also knows to drink juice,eat cracker,or take sugar tablet if she develops symptoms such as tremors, sweating, shaking. I feel comfortable that  the patient will be able to titrate up her medications without a problem since she has been doing this at home in the past several months. -Continue glipizide 10 mg by mouth twice a day -Encourage diet and exercise -Ophthalmology eval 08/2011 -I would not get CBC, BMP, lipid panel today because of financial constraints and patient would like to defer until she has medical insurance. -Start ACE inhibitor, although her urine microalbumin creatinine a year ago had a ratio 5 -Repeat hemoglobin A1c in 3-6 months

## 2012-03-17 ENCOUNTER — Other Ambulatory Visit: Payer: Self-pay | Admitting: Internal Medicine

## 2012-03-17 DIAGNOSIS — Z1231 Encounter for screening mammogram for malignant neoplasm of breast: Secondary | ICD-10-CM

## 2012-03-23 ENCOUNTER — Other Ambulatory Visit: Payer: Self-pay | Admitting: Family Medicine

## 2012-04-03 ENCOUNTER — Other Ambulatory Visit: Payer: Self-pay | Admitting: Internal Medicine

## 2012-04-04 NOTE — Telephone Encounter (Signed)
Why does she requesting for Tessalon? Coughing?

## 2012-04-07 NOTE — Telephone Encounter (Signed)
I am not able to get in touch with pt.  Her phone # is incorrect. I have not heard from her for second request. Do you want to deny this refill?

## 2012-04-10 ENCOUNTER — Encounter: Payer: Self-pay | Admitting: *Deleted

## 2012-04-10 ENCOUNTER — Encounter: Payer: Self-pay | Admitting: Internal Medicine

## 2012-04-10 ENCOUNTER — Ambulatory Visit (INDEPENDENT_AMBULATORY_CARE_PROVIDER_SITE_OTHER): Payer: Self-pay | Admitting: Internal Medicine

## 2012-04-10 VITALS — BP 115/74 | HR 77 | Temp 98.4°F | Ht 67.0 in | Wt 264.5 lb

## 2012-04-10 DIAGNOSIS — N390 Urinary tract infection, site not specified: Secondary | ICD-10-CM

## 2012-04-10 DIAGNOSIS — N3090 Cystitis, unspecified without hematuria: Secondary | ICD-10-CM | POA: Insufficient documentation

## 2012-04-10 DIAGNOSIS — N3000 Acute cystitis without hematuria: Secondary | ICD-10-CM

## 2012-04-10 LAB — POCT URINALYSIS DIPSTICK
Bilirubin, UA: NEGATIVE
Glucose, UA: >=1000
Nitrite, UA: POSITIVE

## 2012-04-10 LAB — GLUCOSE, CAPILLARY: Glucose-Capillary: 196 mg/dL — ABNORMAL HIGH (ref 70–99)

## 2012-04-10 MED ORDER — CIPROFLOXACIN HCL 250 MG PO TABS: 250.0000 mg | ORAL_TABLET | Freq: Two times a day (BID) | ORAL | Status: AC

## 2012-04-10 NOTE — Patient Instructions (Signed)

## 2012-04-10 NOTE — Progress Notes (Signed)
Pt came into clinic with c/o urgency of urination and frequency.  Onset 1 week ago. She has tried OTC meds with no relief. Denies fever, denies pain.  Will see this AM

## 2012-04-10 NOTE — Progress Notes (Signed)
Patient ID: Amy Norris, female   DOB: [birth year], 46 y.o.   MRN: [id number]  46 Y/o w with pmh listed below comes for c/o dysuria, heaviness in lower abdomen before urinating. Tried OTC cranberry UTI pill, Mannis infection pill and some other OTC medication without benefit. Ongoing for 5 days. Has happened before many years ago that resolved with the OTC meds. No abd pain, back pain, nausea, vomiting, fever or other complaints.    Physical exam  General Appearance:     Filed Vitals:   04/10/12 1005  BP: 115/74  Pulse: 77  Temp: 98.4 F (36.9 C)  TempSrc: Oral  Height: 5\' 7"  (1.702 m)  Weight: 264 lb 8 oz (119.976 kg)  SpO2: 96%     Alert, cooperative, no distress, appears stated age  Head:    Normocephalic, without obvious abnormality, atraumatic  Eyes:    PERRL, conjunctiva/corneas clear, EOM's intact, fundi    benign, both eyes       Neck:   Supple, symmetrical, trachea midline, no adenopathy;       thyroid:  No enlargement/tenderness/nodules; no carotid   bruit or JVD  Lungs:     Clear to auscultation bilaterally, respirations unlabored  Chest wall:    No tenderness or deformity  Heart:    Regular rate and rhythm, S1 and S2 normal, no murmur, rub   or gallop  Abdomen:     Soft, non-tender, bowel sounds active all four quadrants,    no masses, no organomegaly  Extremities:   Extremities normal, atraumatic, no cyanosis or edema  Pulses:   2+ and symmetric all extremities  Skin:   Skin color, texture, turgor normal, no rashes or lesions  Neurologic:  nonfocal grossly    ROS  Constitutional: Denies fever, chills, diaphoresis, appetite change and fatigue.  Respiratory: Denies SOB, DOE, cough, chest tightness,  and wheezing.   Cardiovascular: Denies chest pain, palpitations and leg swelling.  Gastrointestinal: Denies nausea, vomiting, abdominal pain, diarrhea, constipation, blood in stool and abdominal distention.  Skin: Denies pallor, rash and wound.  Neurological: Denies  dizziness, light-headedness, numbness and headaches.

## 2012-04-10 NOTE — Assessment & Plan Note (Signed)
Five days duration, no complications  Treat with empiric cipro for 3 days UA and culture

## 2012-04-11 LAB — URINALYSIS, MICROSCOPIC ONLY
Bacteria, UA: NONE SEEN
Crystals: NONE SEEN

## 2012-04-11 LAB — URINALYSIS, ROUTINE W REFLEX MICROSCOPIC
Bilirubin Urine: NEGATIVE
Ketones, ur: NEGATIVE mg/dL
Nitrite: POSITIVE — AB
Protein, ur: NEGATIVE mg/dL
Specific Gravity, Urine: 1.008 (ref 1.005–1.030)
Urobilinogen, UA: 0.2 mg/dL (ref 0.0–1.0)

## 2012-04-14 ENCOUNTER — Ambulatory Visit (HOSPITAL_COMMUNITY): Payer: Self-pay

## 2012-04-14 LAB — URINE CULTURE: Colony Count: 50000

## 2012-05-01 ENCOUNTER — Ambulatory Visit (HOSPITAL_COMMUNITY)
Admission: RE | Admit: 2012-05-01 | Discharge: 2012-05-01 | Disposition: A | Discharge status: Home / Self Care | Payer: Self-pay | Source: Ambulatory Visit | Attending: Internal Medicine | Admitting: Internal Medicine

## 2012-05-01 DIAGNOSIS — Z1231 Encounter for screening mammogram for malignant neoplasm of breast: Secondary | ICD-10-CM

## 2012-06-27 ENCOUNTER — Other Ambulatory Visit: Payer: Self-pay | Admitting: Internal Medicine

## 2012-06-29 ENCOUNTER — Other Ambulatory Visit: Payer: Self-pay | Admitting: *Deleted

## 2012-06-29 DIAGNOSIS — E119 Type 2 diabetes mellitus without complications: Secondary | ICD-10-CM

## 2012-06-30 MED ORDER — GLIPIZIDE 10 MG PO TABS: 10.0000 mg | ORAL_TABLET | Freq: Two times a day (BID) | ORAL | Status: DC

## 2012-07-01 NOTE — Telephone Encounter (Signed)
Rx called in to pharmacy. 

## 2012-09-14 ENCOUNTER — Encounter: Payer: Self-pay | Admitting: Internal Medicine

## 2012-09-14 ENCOUNTER — Ambulatory Visit (INDEPENDENT_AMBULATORY_CARE_PROVIDER_SITE_OTHER): Payer: Self-pay | Admitting: Internal Medicine

## 2012-09-14 VITALS — BP 124/73 | HR 69 | Temp 97.9°F | Ht 67.0 in | Wt 252.7 lb

## 2012-09-14 DIAGNOSIS — E119 Type 2 diabetes mellitus without complications: Secondary | ICD-10-CM

## 2012-09-14 DIAGNOSIS — F329 Major depressive disorder, single episode, unspecified: Secondary | ICD-10-CM

## 2012-09-14 DIAGNOSIS — Z79899 Other long term (current) drug therapy: Secondary | ICD-10-CM

## 2012-09-14 DIAGNOSIS — I10 Essential (primary) hypertension: Secondary | ICD-10-CM

## 2012-09-14 DIAGNOSIS — Z23 Encounter for immunization: Secondary | ICD-10-CM

## 2012-09-14 LAB — COMPLETE METABOLIC PANEL WITH GFR
ALT: 14 U/L (ref 0–35)
Alkaline Phosphatase: 49 U/L (ref 39–117)
BUN: 14 mg/dL (ref 6–23)
CO2: 27 mEq/L (ref 19–32)
Calcium: 9.2 mg/dL (ref 8.4–10.5)
Chloride: 105 mEq/L (ref 96–112)
Creat: 0.65 mg/dL (ref 0.50–1.10)
GFR, Est African American: >89 mL/min
GFR, Est Non African American: >89 mL/min
Glucose, Bld: 201 mg/dL — ABNORMAL HIGH (ref 70–99)
Total Bilirubin: 0.6 mg/dL (ref 0.3–1.2)
Total Protein: 6.3 g/dL (ref 6.0–8.3)

## 2012-09-14 LAB — CBC WITH DIFFERENTIAL/PLATELET
Basophils Absolute: 0 10*3/uL (ref 0.0–0.1)
Basophils Relative: 0 % (ref 0–1)
Eosinophils Absolute: 0.1 10*3/uL (ref 0.0–0.7)
Hemoglobin: 13.5 g/dL (ref 12.0–15.0)
Lymphs Abs: 2.3 10*3/uL (ref 0.7–4.0)
MCH: 29.6 pg (ref 26.0–34.0)
MCHC: 33.9 g/dL (ref 30.0–36.0)
Neutro Abs: 3.9 10*3/uL (ref 1.7–7.7)
Neutrophils Relative %: 58 % (ref 43–77)
Platelets: 270 10*3/uL (ref 150–400)
RBC: 4.56 MIL/uL (ref 3.87–5.11)
WBC: 6.7 10*3/uL (ref 4.0–10.5)

## 2012-09-14 LAB — POCT GLYCOSYLATED HEMOGLOBIN (HGB A1C): Hemoglobin A1C: 8.3

## 2012-09-14 LAB — LIPID PANEL
Cholesterol: 190 mg/dL (ref 0–200)
Total CHOL/HDL Ratio: 4.1 Ratio
Triglycerides: 117 mg/dL (ref <150)

## 2012-09-14 LAB — GLUCOSE, CAPILLARY: Glucose-Capillary: 217 mg/dL — ABNORMAL HIGH (ref 70–99)

## 2012-09-14 MED ORDER — FLUOXETINE HCL 40 MG PO CAPS: 40.0000 mg | ORAL_CAPSULE | Freq: Every day | ORAL | Status: DC

## 2012-09-14 MED ORDER — INSULIN NPH ISOPHANE & REGULAR (70-30) 100 UNIT/ML ~~LOC~~ SUSP: SUBCUTANEOUS | Status: DC

## 2012-09-14 NOTE — Assessment & Plan Note (Signed)
Well controlled, will increase Prozac to 40mg  one tab daily

## 2012-09-14 NOTE — Progress Notes (Signed)
HPI: Amy Norris is a 46 yo W with PMH of DM, HTN, OSA presents today for routine follow up.  She has been checking her blood sugar only 2 times per day, one in the morning at lunch time.  Her sugars have been running in 200-340's. Denies any episode of hypoglycemia.  Of note, she reports that the 2 low levels were not hers because she let her husband used her meter yesterday.  She has occasional numbness and tingling in her toes.  No other complaints. She states that her normal dose of Prozac is 40mg  not 20mg  and is asking to change to the right dose, denies any worsening of depression.    ROS: as per HPI  PE: General: alert, well-developed, and cooperative to examination.  Lungs: normal respiratory effort, no accessory muscle use, normal breath sounds, no crackles, and no wheezes. Heart: normal rate, regular rhythm, no murmur, no gallop, and no rub.  Abdomen: obese,soft, non-tender, normal bowel sounds, no distention, no guarding, no rebound tenderness Msk: no joint swelling, no joint warmth, and no redness over joints.  Pulses: 2+ DP/PT pulses bilaterally Extremities: No cyanosis, clubbing, edema Neurologic: alert & oriented X3, cranial nerves II-XII intact, strength normal in all extremities, sensation intact to light touch, and gait normal.  Skin: turgor normal and no rashes.  Psych: Oriented X3, memory intact for recent and remote, normally interactive, good eye contact, not anxious appearing, and not depressed appearing.

## 2012-09-14 NOTE — Assessment & Plan Note (Signed)
Not well controlled. HbA1c today is 8.3 which is up from 7.7.  I reviewed her meter and blood sugars are ranging from 200-345's.  She had 2 values that are 101 and 121 but reports those were not hers bc she let her husband used her meter. Denies any episode of hypoglycemia. -Will increase 70/30 to 40 units BID -Continue ACEi -Continue Glipizide 10mg  bid -COntinue diet and exercise -Feet exam WNL, up to date with opthalmology exam -Follow up in 3 months

## 2012-09-14 NOTE — Patient Instructions (Addendum)
Increase your 70/30 to 40 units twice daily Continue Glipizide 10mg  twice daily Increase Prozac to 40mg  one tablet daily Get labs today and I will call you with any abnormal results Follow up in 3 months with Dr. Anselm Jungling

## 2012-09-14 NOTE — Assessment & Plan Note (Signed)
Well controlled!!! Will continue Lisinopril 10mg  qd

## 2012-09-15 ENCOUNTER — Other Ambulatory Visit: Payer: Self-pay | Admitting: Internal Medicine

## 2012-09-15 MED ORDER — PRAVASTATIN SODIUM 40 MG PO TABS: 40.0000 mg | ORAL_TABLET | Freq: Every evening | ORAL | Status: DC

## 2012-10-27 ENCOUNTER — Encounter: Payer: Self-pay | Admitting: *Deleted

## 2012-10-30 ENCOUNTER — Telehealth: Payer: Self-pay | Admitting: *Deleted

## 2012-10-30 NOTE — Telephone Encounter (Signed)
Needs refill on Benzonate 100mg  #30 - on past hx med list.

## 2012-10-30 NOTE — Telephone Encounter (Signed)
If she is having a new cough, she needs eval rather than just a refill on her med.

## 2012-11-04 NOTE — Telephone Encounter (Signed)
Talked with pt about refilling Benzonate per Dr Rogelia Boga - appt made 11/05/12 9:45AM  Dr Zada Girt. Pt states has nonproductive cough.

## 2012-11-05 ENCOUNTER — Encounter: Payer: Self-pay | Admitting: Internal Medicine

## 2012-11-05 ENCOUNTER — Ambulatory Visit (INDEPENDENT_AMBULATORY_CARE_PROVIDER_SITE_OTHER): Payer: Self-pay | Admitting: Internal Medicine

## 2012-11-05 VITALS — BP 119/75 | HR 66 | Temp 98.2°F | Ht 67.0 in | Wt 254.9 lb

## 2012-11-05 DIAGNOSIS — E119 Type 2 diabetes mellitus without complications: Secondary | ICD-10-CM

## 2012-11-05 DIAGNOSIS — J218 Acute bronchiolitis due to other specified organisms: Secondary | ICD-10-CM

## 2012-11-05 DIAGNOSIS — E7800 Pure hypercholesterolemia, unspecified: Secondary | ICD-10-CM | POA: Insufficient documentation

## 2012-11-05 DIAGNOSIS — J219 Acute bronchiolitis, unspecified: Secondary | ICD-10-CM

## 2012-11-05 LAB — GLUCOSE, CAPILLARY

## 2012-11-05 MED ORDER — METFORMIN HCL 500 MG PO TABS: 500.0000 mg | ORAL_TABLET | Freq: Every day | ORAL | Status: DC

## 2012-11-05 MED ORDER — PRAVASTATIN SODIUM 40 MG PO TABS: 40.0000 mg | ORAL_TABLET | Freq: Every evening | ORAL | Status: DC

## 2012-11-05 MED ORDER — BENZONATATE 100 MG PO CAPS: 100.0000 mg | ORAL_CAPSULE | Freq: Three times a day (TID) | ORAL | Status: DC | PRN

## 2012-11-05 NOTE — Patient Instructions (Signed)
Please start taking Metformin 500mg  once daily for one week and if you can tolerate it, increase to 500 mg twice daily until you come back to the clinic Please take Tessalon for your cough Please start taking your Pravastatin as prescribed  Please measure your blood sugar 2 times a day  Please apply for medication assistance  You will be see again in 1 month.

## 2012-11-05 NOTE — Assessment & Plan Note (Signed)
Ms Sulkowski's diabetes, continues to be uncontrolled, with blood sugars ranging between 200-250. During her previous visit, Dr. Anselm Jungling evaluated her and recommended starting NPH 70/30 insulin at 40 international units twice a day. However, she reports that she was unable to afford this medication and only took it for about a week. It would cost her about $25 and she does not have a job or health insurance to afford it. I have discussed with [name] and considered all options for the better treatment of this patient's diabetes. Further history in regard to metformin intolerance, the patient reports that she experienced abdominal pains when she started taking on it with the main symptoms being abdominal pain. It is not very clear whether she was started on a smaller dose of metformin before she was escalated to 1000 mg twice daily. She has been given all the possible options including their costs, risks, and benefits. She opts to retry metformin, and the plan will be to start the 500 mg once daily, and increase the dose as tolerable over the course of weeks. Metformin is on the $4 dollar list and would be affordable if she can tolerate it. Combined with Glipizide, they can potentially bring her HA1C to the goal.   Plan  - Start metformin at 500 mg once daily for a week and then 500 mg twice daily and we continue from there. - I have encouraged her to take this medication with food and a full glass of water. -she will follow up in a month as see how she doing with this new plan.

## 2012-11-05 NOTE — Assessment & Plan Note (Signed)
Amy Norris's history and physical findings suggest acute on chronic bronchitis which appears to be related to cold weather.  Plan -Tessalon capsules. The patient reports good results with this medication in the past.

## 2012-11-05 NOTE — Progress Notes (Signed)
Patient ID: Amy Norris, female   DOB: [birth year], 46 y.o.   MRN: [id number]  Subjective:   Patient ID: Amy Norris female   DOB: [birth year] 46 y.o.   MRN: [id number]  HPI: [name] is a 46 y.o. woman with past medical history of obesity, type 2 diabetes, depression, and recurrent bronchitis.  She presents today with complaints of worsening coughing seems the weather changed a few weeks ago. She's been coughing more and more without any sputum production. She basically described her cough is dry but irritating. She reports to have these similar coughs every time it becomes cold. She denies history of chills, or fevers. She reports history of nasal congestion, with some mucus production. She also denies history of chest pain. She has tried over-the-counter cough suppressants without relief.  In terms of her diabetes, the patient reports that she is unable to afford insulin, which was prescribed on her last visit. She has therefore, just been using glipizide at 10 mg twice daily, and she reports compliance with this. Her blood sugars are usually a high in the ranges of 200 to 250s.    Past Medical History  Diagnosis Date  . Bronchitis   . Obesity   . GERD (gastroesophageal reflux disease)   . Diabetes mellitus     Type II  . Depression   . Sleep apnea, obstructive   . Herpes simplex type 1 infection     vaginal  . Menorrhagia   . Carpal tunnel syndrome, bilateral   . Renal calculi   . Hypokalemia     2/2 diarrhea  . Cholelithiasis   . Renal calculus   . Breast mass   . Anal fissure   . Polycystic ovarian disease   . Essential hypertension    Current Outpatient Prescriptions  Medication Sig Dispense Refill  . FLUoxetine (PROZAC) 40 MG capsule Take 1 capsule (40 mg total) by mouth daily.  31 capsule  6  . glipiZIDE (GLUCOTROL) 10 MG tablet Take 1 tablet (10 mg total) by mouth 2 (two) times daily.  60 tablet  6  . Lancets MISC To test blood sugar once daily  100  each  11  . lisinopril (PRINIVIL,ZESTRIL) 10 MG tablet Take 1 tablet (10 mg total) by mouth daily.  90 tablet  4  . benzonatate (TESSALON) 100 MG capsule Take 1 capsule (100 mg total) by mouth 3 (three) times daily as needed for cough.  30 capsule  0  . glucose blood test strip Use as instructed  100 each  12  . insulin NPH-insulin regular (RELION 70/30) (70-30) 100 UNIT/ML injection Inject 40 units in the morning, 40 units at night, within 15 minutes of meal  100 mL  12  . Insulin Syringe-Needle U-100 (B-D INS SYR ULTRAFINE 1CC/31G) 31G X 5/16" 1 ML MISC Use as directed  10 each  11  . metFORMIN (GLUCOPHAGE) 500 MG tablet Take 1 tablet (500 mg total) by mouth daily with breakfast.  60 tablet  11  . pravastatin (PRAVACHOL) 40 MG tablet Take 1 tablet (40 mg total) by mouth every evening.  30 tablet  11  . [DISCONTINUED] pravastatin (PRAVACHOL) 40 MG tablet Take 1 tablet (40 mg total) by mouth every evening.  30 tablet  11  . [DISCONTINUED] pravastatin (PRAVACHOL) 40 MG tablet Take 1 tablet (40 mg total) by mouth every evening.  30 tablet  11   Family History  Problem Relation Age of Onset  . Hypertension Mother   .  Diabetes Father    History   Social History  . Marital Status: Married    Spouse Name: N/A    Number of Children: N/A  . Years of Education: N/A   Occupational History  . [employer]   Social History Main Topics  . Smoking status: Former Smoker    Types: Cigarettes    Quit date: 12/30/1978  . Smokeless tobacco: None  . Alcohol Use: 0.0 oz/week     Comment: 1-2 beers on Friday and Saturday.  . Drug Use: No  . Sexually Active: None   Other Topics Concern  . None   Social History Narrative    Smoked many years ago drinks 1-2 beers on Friday and Saturdays no illegal drug use. Married and lives with her husband and sister-in-law son daughter and grandson.Financial assistance approved for 100% discount at [facility] and has [facility] card per Rudell Cobb as of August 06, 2010  6:27 PM   Review of Systems: Review of Systems  Constitutional: Negative for fever, chills, weight loss and diaphoresis.  HENT: Negative for hearing loss.   Eyes: Negative.   Respiratory: Negative for hemoptysis, shortness of breath and wheezing.   Cardiovascular: Negative for palpitations, orthopnea, claudication, leg swelling and PND.  Genitourinary: Negative.   Musculoskeletal: Negative.   Neurological: Negative.  Negative for weakness and headaches.  Endo/Heme/Allergies: Negative.   Psychiatric/Behavioral: Negative.    Objective:  Physical Exam: Filed Vitals:   11/05/12 0917  BP: 119/75  Pulse: 66  Temp: 98.2 F (36.8 C)  TempSrc: Oral  Height: 5\' 7"  (1.702 m)  Weight: 254 lb 14.4 oz (115.622 kg)  SpO2: 97%   Physical Exam  Constitutional: She is oriented to person, place, and time and well-developed, well-nourished, and in no distress. No distress.  HENT:  Head: Normocephalic and atraumatic.  Cardiovascular: Normal rate, regular rhythm, normal heart sounds and intact distal pulses.  Exam reveals no gallop and no friction rub.   No murmur heard. Pulmonary/Chest: Effort normal and breath sounds normal. No respiratory distress. She has no wheezes. She has no rales. She exhibits no tenderness.  Abdominal: Soft. Bowel sounds are normal.  Musculoskeletal: She exhibits no edema and no tenderness.  Neurological: She is alert and oriented to person, place, and time.  Skin: Skin is warm and dry. She is not diaphoretic.  Psychiatric: Affect normal.   Assessment & Plan:  Assessment and plan for the care of this patient has been discussed with Dr. Aundria Rud.  In brief, the patient will be treated with Tessalon for acute bronchitis and she will be tried on metformin in the hope to improve glycemic control. She is unable to afford insulin. She has been given information to apply for medical assistance program in [place]. Unfortunately, she does not qualify for the orange card.

## 2012-12-20 ENCOUNTER — Encounter (HOSPITAL_COMMUNITY): Payer: Self-pay | Admitting: Emergency Medicine

## 2012-12-20 ENCOUNTER — Emergency Department (HOSPITAL_COMMUNITY)
Admission: EM | Admit: 2012-12-20 | Discharge: 2012-12-20 | Disposition: A | Discharge status: Home / Self Care | Payer: Self-pay | Attending: Emergency Medicine | Admitting: Emergency Medicine

## 2012-12-20 DIAGNOSIS — K089 Disorder of teeth and supporting structures, unspecified: Secondary | ICD-10-CM | POA: Insufficient documentation

## 2012-12-20 DIAGNOSIS — Z87442 Personal history of urinary calculi: Secondary | ICD-10-CM | POA: Insufficient documentation

## 2012-12-20 DIAGNOSIS — K029 Dental caries, unspecified: Secondary | ICD-10-CM

## 2012-12-20 DIAGNOSIS — Z8619 Personal history of other infectious and parasitic diseases: Secondary | ICD-10-CM | POA: Insufficient documentation

## 2012-12-20 DIAGNOSIS — Z9851 Tubal ligation status: Secondary | ICD-10-CM | POA: Insufficient documentation

## 2012-12-20 DIAGNOSIS — Z8709 Personal history of other diseases of the respiratory system: Secondary | ICD-10-CM | POA: Insufficient documentation

## 2012-12-20 DIAGNOSIS — Z87891 Personal history of nicotine dependence: Secondary | ICD-10-CM | POA: Insufficient documentation

## 2012-12-20 DIAGNOSIS — G4733 Obstructive sleep apnea (adult) (pediatric): Secondary | ICD-10-CM | POA: Insufficient documentation

## 2012-12-20 DIAGNOSIS — E119 Type 2 diabetes mellitus without complications: Secondary | ICD-10-CM | POA: Insufficient documentation

## 2012-12-20 DIAGNOSIS — I10 Essential (primary) hypertension: Secondary | ICD-10-CM | POA: Insufficient documentation

## 2012-12-20 DIAGNOSIS — Z8719 Personal history of other diseases of the digestive system: Secondary | ICD-10-CM | POA: Insufficient documentation

## 2012-12-20 DIAGNOSIS — K0889 Other specified disorders of teeth and supporting structures: Secondary | ICD-10-CM

## 2012-12-20 DIAGNOSIS — Z79899 Other long term (current) drug therapy: Secondary | ICD-10-CM | POA: Insufficient documentation

## 2012-12-20 DIAGNOSIS — Z8669 Personal history of other diseases of the nervous system and sense organs: Secondary | ICD-10-CM | POA: Insufficient documentation

## 2012-12-20 DIAGNOSIS — E669 Obesity, unspecified: Secondary | ICD-10-CM | POA: Insufficient documentation

## 2012-12-20 DIAGNOSIS — Z794 Long term (current) use of insulin: Secondary | ICD-10-CM | POA: Insufficient documentation

## 2012-12-20 DIAGNOSIS — F3289 Other specified depressive episodes: Secondary | ICD-10-CM | POA: Insufficient documentation

## 2012-12-20 DIAGNOSIS — F329 Major depressive disorder, single episode, unspecified: Secondary | ICD-10-CM | POA: Insufficient documentation

## 2012-12-20 DIAGNOSIS — Z8742 Personal history of other diseases of the female genital tract: Secondary | ICD-10-CM | POA: Insufficient documentation

## 2012-12-20 MED ORDER — PENICILLIN V POTASSIUM 500 MG PO TABS: 500.0000 mg | ORAL_TABLET | Freq: Three times a day (TID) | ORAL | Status: DC

## 2012-12-20 MED ORDER — OXYCODONE-ACETAMINOPHEN 7.5-500 MG PO TABS: 1.0000 | ORAL_TABLET | ORAL | Status: DC | PRN

## 2012-12-20 MED ORDER — IBUPROFEN 600 MG PO TABS: 600.0000 mg | ORAL_TABLET | Freq: Four times a day (QID) | ORAL | Status: DC | PRN

## 2012-12-20 NOTE — ED Provider Notes (Signed)
History     CSN: [id number]  Arrival date & time 12/20/12  1008   First MD Initiated Contact with Patient 12/20/12 1119      Chief Complaint  Patient presents with  . Dental Pain    Patient is a 46 y.o. female presenting with tooth pain. The history is provided by the patient.  Dental PainThe primary symptoms include mouth pain. The symptoms began 12 to 24 hours ago. The symptoms are worsening. The symptoms occur constantly.  Additional symptoms include: dental sensitivity to temperature, gum tenderness, purulent gums, trouble swallowing, pain with swallowing and excessive salivation. Additional symptoms do not include: facial swelling and dry mouth.    Past Medical History  Diagnosis Date  . Bronchitis   . Obesity   . GERD (gastroesophageal reflux disease)   . Diabetes mellitus     Type II  . Depression   . Sleep apnea, obstructive   . Herpes simplex type 1 infection     vaginal  . Menorrhagia   . Carpal tunnel syndrome, bilateral   . Renal calculi   . Hypokalemia     2/2 diarrhea  . Cholelithiasis   . Renal calculus   . Breast mass   . Anal fissure   . Polycystic ovarian disease   . Essential hypertension     Past Surgical History  Procedure Date  . Tubal ligation 1991    1991  . Cholecystectomy 11/08    Family History  Problem Relation Age of Onset  . Hypertension Mother   . Diabetes Father     History  Substance Use Topics  . Smoking status: Former Smoker    Types: Cigarettes    Quit date: 12/30/1978  . Smokeless tobacco: Not on file  . Alcohol Use: 0.0 oz/week     Comment: 1-2 beers on Friday and Saturday.    OB History    Grav Para Term Preterm Abortions TAB SAB Ect Mult Living                  Review of Systems  HENT: Positive for trouble swallowing. Negative for facial swelling.    All other systems reviewed and are negative Allergies  Vicodin and Metformin and related  Home Medications   Current Outpatient Rx  Name  Route  Sig   Dispense  Refill  . BENZONATATE 100 MG PO CAPS   Oral   Take 100 mg by mouth 3 (three) times daily as needed. For cough         . FLUOXETINE HCL 40 MG PO CAPS   Oral   Take 1 capsule (40 mg total) by mouth daily.   31 capsule   6   . GLIPIZIDE 10 MG PO TABS   Oral   Take 1 tablet (10 mg total) by mouth 2 (two) times daily.   60 tablet   6   . LISINOPRIL 10 MG PO TABS   Oral   Take 1 tablet (10 mg total) by mouth daily.   90 tablet   4   . METFORMIN HCL 500 MG PO TABS   Oral   Take 1 tablet (500 mg total) by mouth daily with breakfast.   60 tablet   11   . PRAVASTATIN SODIUM 40 MG PO TABS   Oral   Take 40 mg by mouth every morning.         Marland Kitchen GLUCOSE BLOOD VI STRP      Use as instructed   100  each   12     Test your blood sugar 2-4 times daily   . IBUPROFEN 600 MG PO TABS   Oral   Take 1 tablet (600 mg total) by mouth every 6 (six) hours as needed for pain.   30 tablet   0   . INSULIN SYRINGE-NEEDLE U-100 31G X 5/16" 1 ML MISC      Use as directed   10 each   11   . LANCETS MISC      To test blood sugar once daily   100 each   11   . OXYCODONE-ACETAMINOPHEN 7.5-500 MG PO TABS   Oral   Take 1 tablet by mouth every 4 (four) hours as needed for pain.   30 tablet   0   . PENICILLIN V POTASSIUM 500 MG PO TABS   Oral   Take 1 tablet (500 mg total) by mouth 3 (three) times daily.   30 tablet   0     BP 130/81  Pulse 84  Temp 98.3 F (36.8 C) (Oral)  Resp 16  SpO2 96%  Physical Exam  Nursing note and vitals reviewed. Constitutional: She is oriented to person, place, and time. She appears well-developed and well-nourished. No distress.  HENT:  Head: Normocephalic and atraumatic. No trismus in the jaw.  Mouth/Throat: Mucous membranes are normal. Abnormal dentition. Dental caries present. No uvula swelling.  Eyes: Pupils are equal, round, and reactive to light.  Neck: Normal range of motion.  Cardiovascular: Normal rate and intact distal  pulses.   Pulmonary/Chest: No respiratory distress.  Abdominal: Normal appearance. She exhibits no distension.  Musculoskeletal: Normal range of motion.  Neurological: She is alert and oriented to person, place, and time. No cranial nerve deficit.  Skin: Skin is warm and dry. No rash noted.  Psychiatric: She has a normal mood and affect. Her behavior is normal.    ED Course  Procedures (including critical care time)  Labs Reviewed - No data to display No results found.   1. Pain, dental   2. Dental caries       MDM        Nelia Shi, MD 12/20/12 [phone]

## 2012-12-29 ENCOUNTER — Other Ambulatory Visit: Payer: Self-pay | Admitting: Internal Medicine

## 2013-02-09 ENCOUNTER — Other Ambulatory Visit: Payer: Self-pay | Admitting: Internal Medicine

## 2013-02-16 ENCOUNTER — Ambulatory Visit (INDEPENDENT_AMBULATORY_CARE_PROVIDER_SITE_OTHER): Payer: Self-pay | Admitting: Internal Medicine

## 2013-02-16 ENCOUNTER — Encounter: Payer: Self-pay | Admitting: Internal Medicine

## 2013-02-16 VITALS — BP 136/80 | HR 75 | Temp 97.8°F | Ht 68.0 in | Wt 254.4 lb

## 2013-02-16 DIAGNOSIS — Z79899 Other long term (current) drug therapy: Secondary | ICD-10-CM

## 2013-02-16 DIAGNOSIS — I10 Essential (primary) hypertension: Secondary | ICD-10-CM

## 2013-02-16 DIAGNOSIS — E119 Type 2 diabetes mellitus without complications: Secondary | ICD-10-CM

## 2013-02-16 DIAGNOSIS — M549 Dorsalgia, unspecified: Secondary | ICD-10-CM

## 2013-02-16 MED ORDER — METFORMIN HCL 1000 MG PO TABS: 500.0000 mg | ORAL_TABLET | Freq: Two times a day (BID) | ORAL | Status: DC

## 2013-02-16 MED ORDER — METFORMIN HCL 1000 MG PO TABS: 1000.0000 mg | ORAL_TABLET | Freq: Two times a day (BID) | ORAL | Status: DC

## 2013-02-16 MED ORDER — CYCLOBENZAPRINE HCL 10 MG PO TABS: 10.0000 mg | ORAL_TABLET | Freq: Three times a day (TID) | ORAL | Status: DC | PRN

## 2013-02-16 MED ORDER — PRAVASTATIN SODIUM 40 MG PO TABS: 40.0000 mg | ORAL_TABLET | Freq: Every morning | ORAL | Status: DC

## 2013-02-16 MED ORDER — IBUPROFEN 600 MG PO TABS: 600.0000 mg | ORAL_TABLET | Freq: Four times a day (QID) | ORAL | Status: DC | PRN

## 2013-02-16 NOTE — Assessment & Plan Note (Addendum)
We BP Readings from Last 3 Encounters:  02/16/13 136/80  12/20/12 130/81  11/05/12 119/75    Lab Results  Component Value Date   NA 136 09/14/2012   K 4.6 09/14/2012   CREATININE 0.65 09/14/2012    Assessment:  Blood pressure control:  well controlled  Progress toward BP goal:   yes   Plan:  Medications:  Continue current meds  Educational resources provided:  yes  Self management tools provided:  yes

## 2013-02-16 NOTE — Assessment & Plan Note (Addendum)
Lab Results  Component Value Date   HGBA1C 8.3 09/14/2012   HGBA1C 7.7 03/05/2012   HGBA1C 10.2 08/21/2011     Assessment:  Diabetes control:   hemoglobin A1c today is 9.5. Poorly controlled. I have discussed with patient before that she likely need insulin in order to control her blood sugar so that we can avoid potential complications of diabetes however patient states that she is unable to afford insulin from [employer]. I will try to discuss again next visit if her A1c is not well controlled with the increase in metformin dosage. I have asked her to titrate her metformin to 1000 mg twice a day. The GI side effects a minimum and she has mild loose stool.  Progress toward A1C goal:   no    Plan:  Medications:  Metformin 1000 mg twice a day.  Home glucose monitoring:   Frequency:   2-3 times   Timing:   before meal  Instruction/counseling given:yes  Educational resources provided:    Self management tools provided:    Other plans: Plan is to have her talk to [employer] next office visit. Also encouraged patient to start insulin. She received her flu vaccine already. She's up-to-date with her eye exam as well as feet exam.

## 2013-02-16 NOTE — Assessment & Plan Note (Signed)
Acute back pain which is slightly to 2 heavy lifting boxes at work which could have caused a sprain or muscle spasm. 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  -Will try NSAIDs: Ibuprofen 600 every 6 hour when necessary -Start flexeril 10 mg Q8 hours when necessary x 1 wk -We discussed physical therapy but she would like to defer at this time -Recommended rest for the next 1-2 weeks -Avoiding lifting heavy boxes -Will check a BMP today to make sure that her electrolytes are within normal limit -Will followup in 4 weeks to reassess to see if she needs more imaging

## 2013-02-16 NOTE — Progress Notes (Signed)
Patient ID: Amy Norris, female   DOB: Nov 08, 1966, 47 y.o.   MRN: [id number] History of present illness: [name] is a 47 year old woman with past medical history of diabetes, hypertension presents today for low back pain x couple months.  Some radiation to legs at time. No leg weakness.  No trauma to her back. 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  Sometimes she has shoulder pain/spasm and that she cant sit still and has to move it to make it feel better.  DM: she has not checked her diabetes at home in a while.  Denies any symptoms of hypoglycemia.  She denies any numbness and tingling in her hands and feet. She did not bring her meter today. She was prescribed insulin in the past however she is unable to afford her medication and wanted to stay on metformin and glipizide. She was instructed to titrate up her metformin however she has only been taking 500 mg each bedtime. She still has minimal GI side effect of the metformin including mild loose stool but it is manageable. In addition she takes glipizide 10 mg twice a day.  ROS: as per HPI  PE: General: alert, well-developed, and cooperative to examination.  Lungs: normal respiratory effort, no accessory muscle use, normal breath sounds, no crackles, and no wheezes. Heart: normal rate, regular rhythm, no murmur, no gallop, and no rub.  Abdomen: soft, non-tender, normal bowel sounds, no distention, no guarding, no rebound tenderness Neurologic: nonfocal Ext: This no back tenderness to palpation. Full range of motion. Patient is able to bend her back.  Skin: turgor normal and no rashes.  Psych: appropriate

## 2013-02-16 NOTE — Patient Instructions (Addendum)
Increase your metfomin 1000mg  twice daily as you can tolerated Start taking Flexeril 10 mg one tablet up to 3 times daily if needed Can take Ibuprofen 600mg  one tablet every 6 hours as needed for pain Follow up in 2-3 weeks with Dr. Anselm Jungling

## 2013-03-02 ENCOUNTER — Encounter: Payer: Self-pay | Admitting: Internal Medicine

## 2013-03-10 ENCOUNTER — Ambulatory Visit (HOSPITAL_COMMUNITY)
Admission: RE | Admit: 2013-03-10 | Discharge: 2013-03-10 | Disposition: A | Discharge status: Home / Self Care | Payer: Self-pay | Source: Ambulatory Visit | Attending: Internal Medicine | Admitting: Internal Medicine

## 2013-03-10 ENCOUNTER — Encounter: Payer: Self-pay | Admitting: Internal Medicine

## 2013-03-10 ENCOUNTER — Ambulatory Visit (INDEPENDENT_AMBULATORY_CARE_PROVIDER_SITE_OTHER): Payer: Self-pay | Admitting: Internal Medicine

## 2013-03-10 VITALS — BP 128/85 | HR 88 | Temp 97.0°F | Ht 67.0 in | Wt 243.5 lb

## 2013-03-10 DIAGNOSIS — M549 Dorsalgia, unspecified: Secondary | ICD-10-CM

## 2013-03-10 DIAGNOSIS — M51379 Other intervertebral disc degeneration, lumbosacral region without mention of lumbar back pain or lower extremity pain: Secondary | ICD-10-CM | POA: Insufficient documentation

## 2013-03-10 DIAGNOSIS — M5137 Other intervertebral disc degeneration, lumbosacral region: Secondary | ICD-10-CM | POA: Insufficient documentation

## 2013-03-10 DIAGNOSIS — Q762 Congenital spondylolisthesis: Secondary | ICD-10-CM | POA: Insufficient documentation

## 2013-03-10 DIAGNOSIS — E119 Type 2 diabetes mellitus without complications: Secondary | ICD-10-CM

## 2013-03-10 MED ORDER — IBUPROFEN 800 MG PO TABS: 800.0000 mg | ORAL_TABLET | Freq: Three times a day (TID) | ORAL | Status: DC | PRN

## 2013-03-10 MED ORDER — ACETAMINOPHEN 500 MG PO TABS: 1000.0000 mg | ORAL_TABLET | Freq: Three times a day (TID) | ORAL | Status: DC | PRN

## 2013-03-10 NOTE — Assessment & Plan Note (Addendum)
Lab Results  Component Value Date   HGBA1C 9.5 02/16/2013   HGBA1C 8.3 09/14/2012   HGBA1C 7.7 03/05/2012     Assessment:  Diabetes control: poor control (HgbA1C >9%)  Progress toward A1C goal:  unable to assess  Plan:  Medications:  continue current medications  Home glucose monitoring:   Frequency:  (does not check)   Timing:    Instruction/counseling given: discussed foot care  Other plans: Urine microalbumin-creatinine and foot exam today  Addendum:  Urine microalbumin-creatinine 14.4

## 2013-03-10 NOTE — Patient Instructions (Addendum)
Take ibuprofen 800 mg every 8 hours as needed for pain.  You can take ACETAMINOPHEN (TYLENOL) for the pain too.  Remember not to take more than 3,000mg  of acetaminophen in 24 hours.  Many medicines, including cold medicines and other pain medicines, have acetaminophen in them; so make sure that the total dose from all sources does not add up to more than 3,000mg .  Back x-ray today.  Physical therapy referral.   SEEK IMMEDIATE MEDICAL CARE IF:  You lose control of your bowel or bladder (incontinence).  You have increasing weakness in the lower back, pelvis, buttocks, or legs.  You have redness or swelling of your back.  You have a burning sensation when you urinate.  You have pain that gets worse when you lie down or awakens you at night.  Your pain is worse than you have experienced in the past.  Your pain is lasting longer than 4 more weeks.  You are suddenly losing weight without reason.

## 2013-03-10 NOTE — Progress Notes (Signed)
  Subjective:    Patient ID: Amy Norris, female    DOB: 1966-12-09, 47 y.o.   MRN: [id number]  HPI:  This is a 47 year old woman with a history of back pain, spondylolisthesis, and lumbago who comes to the clinic one day after a fall with finger pain and back pain.  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 She lost her footing and fell forward, breaking her fall with her hands. She has abrasions on her hands but denies hand or arm pain. Since the fall, her back pain has worsened. Back pain Onset was 2 months ago. No provoking injury noted 2 months ago, the pain is worse since her fall last night. Pain is sharp and shooting. Pain is located in the left lateral lumbar region with radiation into the left buttocks and into the left anterolateral thigh. Pain is rated as an 8/10. Pain is waxing and waning but never completely resolved. Aggravated by walking and flexing of the back and sitting with pressure on the left buttocks. No alleviating factors. Ibuprofen has offered some relief. Patient denies numbness, weakness, saddle paresthesias, loss of bowel or bladder control, fevers, and chills.   Review of Systems  Constitutional: Negative for fever, chills and unexpected weight change.  Genitourinary: Negative for dysuria and difficulty urinating.  Musculoskeletal: Positive for back pain and gait problem.  Neurological: Negative for dizziness, syncope and light-headedness.    Current Medications: 1. Cyclobenzaprine 10 mg 3 times a day as needed 2. Fluoxetine 40 mg daily 3. Glipizide 10 mg twice a day 4. Ibuprofen 600 mg every 6 hours as needed 5. Metformin 1000 mg twice a day 6. Pravastatin 40 mg every morning     Objective:   Physical Exam:  GENERAL: obese; no acute distress LUNGS: clear to auscultation bilaterally, normal work of breathing HEART: normal rate and regular rhythm; normal S1 and S2 without S3 or S4; no murmurs,  rubs, or clicks PULSES: radial, posterior tibial, and dorsalis pedis pulses are 2+ and symmetric ABDOMEN: soft, non-tender, normal bowel sounds MSK: pain with lateral flexion of the lumbar spine, left worse than right; pain with flexion and extension of the lumbar spine; no pain with rotation; there is no tenderness with palpation of the cervical, thoracic, or lumbar spine; there are no deformities or step offs palpated in the spine; there is no tenderness with palpation of the paraspinal musculature and no muscle spasms appreciated MOTOR: 5 out of 5 dorsiflexion, plantarflexion, leg extension, and leg flexion SENSATION: Intact REFLEXES: 2+ patellar reflexes bilaterally SKIN: warm, dry, intact, normal turgor, no rashes EXTREMITIES: no peripheral edema, clubbing, or cyanosis PSYCH: patient is alert and oriented, mood and affect are normal and congruent, thought content is normal without delusions, thought process is linear, speech is normal and non-pressured, behavior is normal   Filed Vitals:   03/10/13 1334  BP: 128/85  Pulse: 88  Temp: 97 F (36.1 C)    BP Readings from Last 3 Encounters:  03/10/13 128/85  02/16/13 136/80  12/20/12 130/81    Recent Labs  03/10/13 1331  GLUCAP 305*         Assessment & Plan:

## 2013-03-10 NOTE — Assessment & Plan Note (Addendum)
History and exam most consistent with lumbar radiculopathy. No evidence of cauda equina or myelopathy. She has a history of spondylolisthesis in the lumbar spine which may be to blame. Her recent fall and subsequent worsening of pain is concerning for new spondylolisthesis, spondylolysis, or vertebral fracture. For this reason, we will get plain films of the back today. Treatment for this kind of injury starts with physical therapy and anti-inflammatory medicines. I will refer her to physical therapy and refill ibuprofen. If these measures are unsuccessful, she will need referral to orthopedic surgery and advanced imaging. 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  Degenerative disc disease at L5-S1, slightly worsened since 2007.  No identifiably acute finding by radiography.

## 2013-03-11 LAB — MICROALBUMIN / CREATININE URINE RATIO
Creatinine, Urine: 74.4 mg/dL
Microalb Creat Ratio: 14.4 mg/g (ref 0.0–30.0)

## 2013-03-16 ENCOUNTER — Ambulatory Visit (INDEPENDENT_AMBULATORY_CARE_PROVIDER_SITE_OTHER): Payer: Self-pay | Admitting: Internal Medicine

## 2013-03-16 ENCOUNTER — Encounter: Payer: Self-pay | Admitting: Internal Medicine

## 2013-03-16 VITALS — BP 127/84 | HR 87 | Temp 98.8°F | Wt 253.9 lb

## 2013-03-16 NOTE — Assessment & Plan Note (Signed)
I think her Xerostomia is likely secondary to the side effect of flexeril as it started around the same time as she started taking the flexeril for back pain. I recommended symptomatic treatment with oral fluid and the dry mouth should improve since she has stopped this medication about 3-4 days ago.  Other differential diagnoses include Sjogren's syndrome but less likely as this is acute. But if it continues to persist, will need autoimmune workup.

## 2013-03-16 NOTE — Progress Notes (Signed)
Patient ID: CRISELDA STARKE, female   DOB: 1966/07/23, 47 y.o.   MRN: [id number] History of present illness: Ms. Weidemann is a 47 year old woman well-known to me presents today for followup. She was seen last week after a fall work and has some radiculopathy. X-ray of the lumbar spine was obtained and did not show any acute fracture except for spondylosis at L5 and also DJD at L5-S1. She has been using ibuprofen as well as Tylenol which significantly helped her pain. She denies any incontinence of the bladder or urine or weakness. She has not tried physical therapy and does not think she needed at this time. She reports a dry mouth in the past 2-3 weeks after taking flexeril however she has stopped taking flexeril about 3 days ago.  Review of system: As per history of present illness  Physical examination: General: alert, well-developed, and cooperative to examination.  Lungs: normal respiratory effort, no accessory muscle use, normal breath sounds, no crackles, and no wheezes. Heart: normal rate, regular rhythm, no murmur, no gallop, and no rub.  Abdomen: soft, non-tender, normal bowel sounds, no distention, no guarding, no rebound tenderness Neurologic: nonfocal Skin: turgor normal and no rashes.  Psych: appropriate

## 2013-03-16 NOTE — Assessment & Plan Note (Signed)
S/p fall last week.  Her back pain is significantly better with NSAID and Tylenol. She does not want or need physical therapy at this time but may be considered in the back pain continued to persist. I reviewed the lumbar x-ray with patient today. Will even consider orthopedic referral if she continues to have pain

## 2013-03-23 ENCOUNTER — Ambulatory Visit: Payer: Self-pay | Attending: Internal Medicine | Admitting: Physical Therapy

## 2013-04-12 NOTE — Addendum Note (Signed)
Addended by: Neomia Dear on: 04/12/2013 06:35 PM   Modules accepted: Orders

## 2013-04-30 ENCOUNTER — Other Ambulatory Visit: Payer: Self-pay | Admitting: Internal Medicine

## 2013-07-08 ENCOUNTER — Other Ambulatory Visit: Payer: Self-pay

## 2013-09-10 ENCOUNTER — Ambulatory Visit (INDEPENDENT_AMBULATORY_CARE_PROVIDER_SITE_OTHER): Payer: Self-pay | Admitting: Internal Medicine

## 2013-09-10 ENCOUNTER — Encounter: Payer: Self-pay | Admitting: Internal Medicine

## 2013-09-10 VITALS — BP 129/84 | HR 87 | Temp 98.1°F | Wt 244.1 lb

## 2013-09-10 DIAGNOSIS — I10 Essential (primary) hypertension: Secondary | ICD-10-CM

## 2013-09-10 DIAGNOSIS — E7800 Pure hypercholesterolemia, unspecified: Secondary | ICD-10-CM

## 2013-09-10 DIAGNOSIS — E119 Type 2 diabetes mellitus without complications: Secondary | ICD-10-CM

## 2013-09-10 DIAGNOSIS — N898 Other specified noninflammatory disorders of vagina: Secondary | ICD-10-CM | POA: Insufficient documentation

## 2013-09-10 DIAGNOSIS — F329 Major depressive disorder, single episode, unspecified: Secondary | ICD-10-CM

## 2013-09-10 DIAGNOSIS — L293 Anogenital pruritus, unspecified: Secondary | ICD-10-CM

## 2013-09-10 DIAGNOSIS — Z0000 Encounter for general adult medical examination without abnormal findings: Secondary | ICD-10-CM

## 2013-09-10 DIAGNOSIS — Z23 Encounter for immunization: Secondary | ICD-10-CM

## 2013-09-10 LAB — BASIC METABOLIC PANEL
BUN: 13 mg/dL (ref 6–23)
Calcium: 8.9 mg/dL (ref 8.4–10.5)
Creat: 0.89 mg/dL (ref 0.50–1.10)

## 2013-09-10 LAB — LIPID PANEL
Cholesterol: 173 mg/dL (ref 0–200)
LDL Cholesterol: 92 mg/dL (ref 0–99)
Total CHOL/HDL Ratio: 4 Ratio
Triglycerides: 188 mg/dL — ABNORMAL HIGH (ref <150)
VLDL: 38 mg/dL (ref 0–40)

## 2013-09-10 MED ORDER — PRAVASTATIN SODIUM 40 MG PO TABS: 40.0000 mg | ORAL_TABLET | Freq: Every morning | ORAL | Status: DC

## 2013-09-10 MED ORDER — "INSULIN SYRINGE-NEEDLE U-100 31G X 5/16"" 1 ML MISC": Status: DC

## 2013-09-10 MED ORDER — METFORMIN HCL 1000 MG PO TABS: 1000.0000 mg | ORAL_TABLET | Freq: Two times a day (BID) | ORAL | Status: DC

## 2013-09-10 MED ORDER — FLUCONAZOLE 150 MG PO TABS: 150.0000 mg | ORAL_TABLET | Freq: Every day | ORAL | Status: AC

## 2013-09-10 MED ORDER — INSULIN NPH ISOPHANE & REGULAR (70-30) 100 UNIT/ML ~~LOC~~ SUSP: 40.0000 [IU] | Freq: Two times a day (BID) | SUBCUTANEOUS | Status: DC

## 2013-09-10 MED ORDER — GLIPIZIDE 10 MG PO TABS: ORAL_TABLET | ORAL | Status: DC

## 2013-09-10 MED ORDER — FLUOXETINE HCL 40 MG PO CAPS: 40.0000 mg | ORAL_CAPSULE | Freq: Every day | ORAL | Status: DC

## 2013-09-10 MED ORDER — LANCETS MISC: Status: DC

## 2013-09-10 MED ORDER — LISINOPRIL 10 MG PO TABS: ORAL_TABLET | ORAL | Status: DC

## 2013-09-10 NOTE — Patient Instructions (Addendum)
General Instructions: 1. Please make a follow up appointment with Dr. Yetta Barre for 2-3 weeks.  2. Please take all medications as prescribed. Please restart Metformin 1000 mg twice daily, Glipizide 10 mg twice daily, and Novolin 70/30 insulin, 40 units in AM and 40 units in PM. Check your blood sugar 3 times daily and bring your meter with you to the next appointment. Continue to take all of your other medications as well!  3. If you have worsening of your symptoms or new symptoms arise, please call the clinic [phone]), or go to the ER immediately if symptoms are severe.     Treatment Goals:  Goals (1 Years of Data) as of 09/10/13         As of Today 03/16/13 03/10/13 02/16/13 12/20/12     Blood Pressure    . Blood Pressure < 130/80  129/84 127/84 128/85 136/80 130/81     Result Component    . HEMOGLOBIN A1C < 7.0     9.5     . LDL CALC < 100            Progress Toward Treatment Goals:  Treatment Goal 09/10/2013  Hemoglobin A1C deteriorated  Blood pressure at goal    Self Care Goals & Plans:  Self Care Goal 09/10/2013  Manage my medications take my medicines as prescribed; refill my medications on time  Monitor my health keep track of my blood glucose; bring my glucose meter and log to each visit  Eat healthy foods eat foods that are low in salt; eat baked foods instead of fried foods  Be physically active join a walking program    Home Blood Glucose Monitoring 09/10/2013  Check my blood sugar no home glucose monitoring  When to check my blood sugar N/A     Care Management & Community Referrals:  Referral 03/10/2013  Referrals made for care management support none needed

## 2013-09-10 NOTE — Progress Notes (Signed)
Subjective:   Patient ID: Amy Norris female   DOB: 15-May-1966 47 y.o.   MRN: [id number]  HPI: Amy Norris is a 47 y.o. F w/ PMHx of HTN, OSA, DM type II (last HbA1c 9.5 on 02/16/13), GERD, PCOS, depression, and chronic back pain, presents to the clinic today for diabetes follow-up.   She was last seen on 03/16/13 and at that time was taking Glipizide 10 mg bid with meals and Metformin 1000 mg bid + Insulin 70/30 40 units in AM and 40 units in PM. She says she has not been able to take any of her medications in at least the last month because she cannot afford any of them lately. She claims she has not used her Insulin for almost 3 months. She also has not been checking her blood sugar at home because she cannot afford the test strips either. The patient is uninsured and has discussed orange card coverage in the past but she did not qualify.  Today she does not complain of any significant medical issues. She does however describe symptoms of polyuria and polydipsia. She says she wakes up 3-4 times per night to go to the bathroom and also has a glass of water at bedside that she drinks in its entirety before the AM. She also complains of recent change of vision and claims she has not had her eyes checked since 08/2012 for her diabetic eye changes. Otherwise, she denies any recent headaches, dizziness, lightheadedness, palpitations, diaphoresis, tremors, nausea, vomiting, abdominal pain, or diarrhea.   The patient also complains of some vaginal pruritis associated with her menstrual cycle. She says she will have significant itching before, during, or sometimes directly after her period. She denies any foul odors or vaginal discharge, no dysuria, hematuria, or urgency. She is typically irregular, sometimes going 5-6 weeks without menstruating. Her periods last about 5 days in duration.   Past Medical History  Diagnosis Date  . Bronchitis   . Obesity   . GERD (gastroesophageal reflux disease)    . Diabetes mellitus     Type II  . Depression   . Sleep apnea, obstructive   . Herpes simplex type 1 infection     vaginal  . Menorrhagia   . Carpal tunnel syndrome, bilateral   . Renal calculi   . Hypokalemia     2/2 diarrhea  . Cholelithiasis   . Renal calculus   . Breast mass   . Anal fissure   . Polycystic ovarian disease   . Essential hypertension    Current Outpatient Prescriptions  Medication Sig Dispense Refill  . acetaminophen (TYLENOL) 500 MG tablet Take 2 tablets (1,000 mg total) by mouth every 8 (eight) hours as needed for pain.      . benzonatate (TESSALON) 100 MG capsule TAKE ONE CAPSULE BY MOUTH THREE TIMES DAILY AS NEEDED FOR COUGH  30 capsule  0  . FLUoxetine (PROZAC) 40 MG capsule Take 1 capsule (40 mg total) by mouth daily.  31 capsule  6  . glipiZIDE (GLUCOTROL) 10 MG tablet TAKE ONE TABLET BY MOUTH TWICE DAILY  60 tablet  0  . glucose blood test strip Use as instructed  100 each  12  . ibuprofen (ADVIL,MOTRIN) 800 MG tablet Take 1 tablet (800 mg total) by mouth every 8 (eight) hours as needed for pain.  30 tablet  0  . Insulin Syringe-Needle U-100 (B-D INS SYR ULTRAFINE 1CC/31G) 31G X 5/16" 1 ML MISC Use as directed  10  each  11  . Lancets MISC To test blood sugar once daily  100 each  11  . lisinopril (PRINIVIL,ZESTRIL) 10 MG tablet TAKE ONE TABLET BY MOUTH EVERY DAY  90 tablet  0  . metFORMIN (GLUCOPHAGE) 1000 MG tablet Take 1 tablet (1,000 mg total) by mouth 2 (two) times daily with a meal.  60 tablet  6  . pravastatin (PRAVACHOL) 40 MG tablet Take 1 tablet (40 mg total) by mouth every morning.  90 tablet  6   No current facility-administered medications for this visit.   Family History  Problem Relation Age of Onset  . Hypertension Mother   . Diabetes Father    History   Social History  . Marital Status: Married    Spouse Name: N/A    Number of Children: N/A  . Years of Education: N/A   Occupational History  . [employer]   Social  History Main Topics  . Smoking status: Former Smoker    Types: Cigarettes    Quit date: 12/30/1978  . Smokeless tobacco: Not on file  . Alcohol Use: 0.0 oz/week     Comment: 1-2 beers on Friday and Saturday.  . Drug Use: No  . Sexual Activity: Not on file   Other Topics Concern  . Not on file   Social History Narrative    Smoked many years ago drinks 1-2 beers on Friday and Saturdays no illegal drug use. Married and lives with her husband and sister-in-law son daughter and grandson.   Financial assistance approved for 100% discount at [facility] and has [facility] card per Rudell Cobb as of August 06, 2010 6:27 PM   Review of Systems: General: Denies fever, chills, diaphoresis, appetite change and fatigue.  HEENT: Positive for change in vision. Denies eye pain, redness, hearing loss, congestion, sore throat, rhinorrhea, sneezing, trouble swallowing, neck pain, neck stiffness.   Respiratory: Denies SOB, DOE, cough, chest tightness, and wheezing.   Cardiovascular: Denies chest pain, palpitations and leg swelling.  Gastrointestinal: Denies nausea, vomiting, abdominal pain, diarrhea, constipation, blood in stool and abdominal distention.  Genitourinary: Positive for vaginal itching. Denies dysuria, urgency, frequency, hematuria, flank pain and difficulty urinating.  Endocrine: Positive for polyuria and polydipsia. Denies hot or cold intolerance and sweats. Musculoskeletal: Denies myalgias, back pain, joint swelling, arthralgias and gait problem.  Skin: Denies pallor, rash and wounds.  Neurological: Denies dizziness, seizures, syncope, weakness, lightheadedness, numbness and headaches.  Psychiatric/Behavioral: Denies mood changes, confusion, nervousness, sleep disturbance and agitation.  Objective:   Physical Exam: Filed Vitals:   09/10/13 1316  BP: 129/84  Pulse: 87  Temp: 98.1 F (36.7 C)  TempSrc: Oral  Weight: 244 lb 1.6 oz (110.723 kg)  SpO2: 98%   General: Vital signs reviewed.   Patient is a well-developed and well-nourished, in no acute distress and cooperative with exam. Alert and oriented x3.  Head: Normocephalic and atraumatic. Eyes: PERRL, EOMI, conjunctivae normal, No scleral icterus. Patient wears glasses. Neck: Supple, trachea midline, normal ROM, No JVD, masses, thyromegaly, or carotid bruit present.  Cardiovascular: RRR, S1 normal, S2 normal, no murmurs, gallops, or rubs. Pulmonary/Chest: Normal respiratory effort, CTAB, no wheezes, rales, or rhonchi. Abdominal: Soft. Non-tender, non-distended, bowel sounds are normal, no masses, organomegaly, or guarding present.  Musculoskeletal: No joint deformities, erythema, or stiffness. Extremities: No swelling or edema,  pulses symmetric and intact bilaterally. No cyanosis or clubbing. Neurological: A&O x3, Strength is normal and symmetric bilaterally, cranial nerve II-XII are grossly intact, no focal motor  deficit, sensory intact to light touch bilaterally.  Skin: Warm, dry and intact. No rashes or erythema. Psychiatric: Normal mood and affect. speech and behavior is normal. Cognition and memory are normal.   Assessment & Plan:   Please see problem-based assessment and plan.

## 2013-09-10 NOTE — Progress Notes (Signed)
Patient ID: Amy Norris, female   DOB: June 06, 1966, 47 y.o.   MRN: [id number]   I met with the patient and discussed compliance with medications. She agrees to restart her diabetes regimen this week. For her vaginal itching complaint, she denies exam, however reports that she was benefited by Fluconazole 150 mg one time given to her at a prior visit. Given the low risk-benefit ratio, I am okay with prescribing that for now.   I personally confirmed the key portions of the history and exam documented by Dr. Yetta Barre and I reviewed pertinent patient test results.  The assessment, diagnosis, and plan were formulated together and I agree with the documentation in the resident's note.

## 2013-09-11 ENCOUNTER — Encounter: Payer: Self-pay | Admitting: Internal Medicine

## 2013-09-11 NOTE — Assessment & Plan Note (Signed)
Patient previously placed on Metformin 1000 mg bid, Glipizide 10 mg bid, and Insulin 70/30 40 units bid. She reports not taking her meds for the past 1 month and her insulin for almost 3 months due to cost and claims she has not been able to purchase any of her medications. She presents to the clinic without significant symptoms of hyper- or hypoglycemia, however she does admit to some polyuria, polydipsia anf vision changes. -CBG in clinic was 311, HbA1c was 12.0. Last HbA1c in 01/2013 was 9.5. -Advised to restart Metformin, Glipizide, and Insulin 70/30. She claims she will be able to get her medications in a day or two. She was also given a vial of 70/30 in the clinic today. -Scheduled follow up for 2 weeks, discussed compliance, and instructed patient to return after having checked her blood sugars at home.

## 2013-09-11 NOTE — Assessment & Plan Note (Addendum)
Well controlled today, 129/84. Patient has not been taking her Lisinopril, but will continue in the next day or two after she is able to purchase her medications.  -Given refill of Lisinopril today.

## 2013-09-11 NOTE — Assessment & Plan Note (Signed)
Patient complains of vaginal itching associated with her menstrual cycle. She says she will have significant itching before, during, or after her period. She denies foul odor, discharge, pain, dysuria, hematuria, or urgency. Refused exam today.  -Patient has had this complaint in the past, given Diflucan 150 mg once, which previously worked for her.  -Given 150 mg Diflucan once today. She will return in 2 weeks for follow up.

## 2013-09-11 NOTE — Assessment & Plan Note (Signed)
Given flu shot  today 

## 2013-09-11 NOTE — Assessment & Plan Note (Addendum)
Patient previously prescribed Pravastatin 40 mg qhs. Last Lipid profile was 09/14/12, showed total cholesterol of 190, TG's of 117, LDL of 121 and HDL of 46. She reports recent non-compliance with all of her medications including her Pravachol. -Repeat lipid panel today shows total cholesterol of 173, TG's of 188, LDL of 92, and HDL of 42. Will continue Pravastatin and repeat Lipid panel in several months.

## 2013-09-11 NOTE — Assessment & Plan Note (Signed)
Well controlled at this time, patient denies any depressive symptoms, despite not having taken her Fluoxetine in some time.  -Provided a refill today.

## 2013-09-27 ENCOUNTER — Encounter: Payer: Self-pay | Admitting: Internal Medicine

## 2013-09-27 ENCOUNTER — Ambulatory Visit (INDEPENDENT_AMBULATORY_CARE_PROVIDER_SITE_OTHER): Payer: Self-pay | Admitting: Internal Medicine

## 2013-09-27 VITALS — BP 134/81 | HR 70 | Temp 97.6°F | Ht 68.0 in | Wt 251.9 lb

## 2013-09-27 DIAGNOSIS — E119 Type 2 diabetes mellitus without complications: Secondary | ICD-10-CM

## 2013-09-27 DIAGNOSIS — L293 Anogenital pruritus, unspecified: Secondary | ICD-10-CM

## 2013-09-27 DIAGNOSIS — N898 Other specified noninflammatory disorders of vagina: Secondary | ICD-10-CM

## 2013-09-27 DIAGNOSIS — I10 Essential (primary) hypertension: Secondary | ICD-10-CM

## 2013-09-27 MED ORDER — GLIPIZIDE 10 MG PO TABS: ORAL_TABLET | ORAL | Status: DC

## 2013-09-27 MED ORDER — METFORMIN HCL 1000 MG PO TABS: 1000.0000 mg | ORAL_TABLET | Freq: Every evening | ORAL | Status: DC

## 2013-09-27 NOTE — Progress Notes (Signed)
Patient ID: Amy Norris, female   DOB: 1966/10/28, 47 y.o.   MRN: [id number]   Subjective:   Patient ID: Amy Norris female   DOB: 1966-12-14 47 y.o.   MRN: [id number]  HPI: [name] is a 47 y.o. F w/ PMHx of HTN, OSA, DM type II (last HbA1c 12.0 on 09/11/13), GERD, PCOS?, depression, and chronic back pain, presents to the clinic today for diabetes follow-up.   She was last seen on 09/11/13 for vaginal pruritis (given 1 dose of Diflucan, 150 mg), but was also found to have not been taking any of her medications 2/2 monetary issues. She was previously taking Glipizide 10 mg bid with meals and Metformin 1000 mg bid + Insulin 70/30 40 units in AM and 40 units in PM, but had not been managing her DM at all and had not been checking her blood sugars. Her HbA1c was 12.0, up from 9.5 on 02/16/13. She claimed that she had not used her Insulin for almost 3 months prior to her last appointment. The patient is uninsured and has discussed orange card coverage in the past but she did not qualify. She did however assure me at her last visit that she would be able to get her medications and would start taking them regularly again.  Today, she returns claiming that she has been taking all of her medications regularly for the past 2 weeks. She has only been taking Metformin 1000 mg in the evening however, because it gives her abdominal pain and diarrhea if she takes it twice a day. She still has not been checking her blood sugars at all as she says she cannot afford a meter. One day last week, she felt "funny" in the AM, where she felt tremulous and lightheaded. At that time, she used her friends' meter to check her sugar and found it was 76. She then said she ate a cupcake and felt fine. This episode of hypoglycemia was most likely because she had not eaten anything prior to this, according to the patient. On arrival today, blood sugar was checked and was 151.   Otherwise, she denies any other medical  issues. She says her vaginal pruritis has completely subsided after taking the Diflucan. She also denies any recent headaches, dizziness, palpitations, diaphoresis, nausea, vomiting, abdominal pain, or diarrhea. She claims she has started retaking all of her medications regularly, except for her Pravastatin, which she claims she still cannot afford. She assures me that she is going to buy a meter and her Pravachol this week as she is getting paid.  Past Medical History  Diagnosis Date  . Bronchitis   . Obesity   . GERD (gastroesophageal reflux disease)   . Diabetes mellitus     Type II  . Depression   . Sleep apnea, obstructive   . Herpes simplex type 1 infection     vaginal  . Menorrhagia   . Carpal tunnel syndrome, bilateral   . Renal calculi   . Hypokalemia     2/2 diarrhea  . Cholelithiasis   . Renal calculus   . Breast mass   . Anal fissure   . Polycystic ovarian disease   . Essential hypertension    Current Outpatient Prescriptions  Medication Sig Dispense Refill  . acetaminophen (TYLENOL) 500 MG tablet Take 2 tablets (1,000 mg total) by mouth every 8 (eight) hours as needed for pain.      . benzonatate (TESSALON) 100 MG capsule TAKE ONE CAPSULE BY MOUTH  THREE TIMES DAILY AS NEEDED FOR COUGH  30 capsule  0  . fluconazole (DIFLUCAN) 150 MG tablet Take 1 tablet (150 mg total) by mouth daily.  1 tablet  0  . FLUoxetine (PROZAC) 40 MG capsule Take 1 capsule (40 mg total) by mouth daily.  31 capsule  6  . glipiZIDE (GLUCOTROL) 10 MG tablet TAKE ONE TABLET BY MOUTH TWICE DAILY  60 tablet  2  . glucose blood test strip Use as instructed  100 each  12  . ibuprofen (ADVIL,MOTRIN) 800 MG tablet Take 1 tablet (800 mg total) by mouth every 8 (eight) hours as needed for pain.  30 tablet  0  . insulin NPH-regular (NOVOLIN 70/30 RELION) (70-30) 100 UNIT/ML injection Inject 40 Units into the skin 2 (two) times daily with a meal.  10 mL  12  . Insulin Syringe-Needle U-100 (B-D INS SYR  ULTRAFINE 1CC/31G) 31G X 5/16" 1 ML MISC Use as directed  10 each  11  . Lancets MISC To test blood sugar once daily  100 each  11  . lisinopril (PRINIVIL,ZESTRIL) 10 MG tablet TAKE ONE TABLET BY MOUTH EVERY DAY  90 tablet  2  . metFORMIN (GLUCOPHAGE) 1000 MG tablet Take 1 tablet (1,000 mg total) by mouth 2 (two) times daily with a meal.  60 tablet  6  . pravastatin (PRAVACHOL) 40 MG tablet Take 1 tablet (40 mg total) by mouth every morning.  90 tablet  6   No current facility-administered medications for this visit.   Family History  Problem Relation Age of Onset  . Hypertension Mother   . Diabetes Father    History   Social History  . Marital Status: Married    Spouse Name: N/A    Number of Children: N/A  . Years of Education: N/A   Occupational History  . Deli [employer]   Social History Main Topics  . Smoking status: Former Smoker    Types: Cigarettes    Quit date: 12/30/1978  . Smokeless tobacco: Not on file  . Alcohol Use: 0.0 oz/week     Comment: 1-2 beers on Friday and Saturday.  . Drug Use: No  . Sexual Activity: Not on file   Other Topics Concern  . Not on file   Social History Narrative    Smoked many years ago drinks 1-2 beers on Friday and Saturdays no illegal drug use. Married and lives with her husband and sister-in-law son daughter and grandson.   Financial assistance approved for 100% discount at [facility] and has [facility] card per Rudell Cobb as of August 06, 2010 6:27 PM   Review of Systems: General: Denies fever, chills, diaphoresis, appetite change and fatigue.  HEENT: Positive for recent change in vision. Denies eye pain, redness, hearing loss, congestion, sore throat, rhinorrhea, sneezing, trouble swallowing, neck pain, neck stiffness.   Respiratory: Denies SOB, DOE, cough, chest tightness, and wheezing.   Cardiovascular: Denies chest pain, palpitations and leg swelling.  Gastrointestinal: Denies nausea, vomiting, abdominal pain, diarrhea, constipation,  blood in stool and abdominal distention.  Genitourinary: Denies dysuria, urgency, frequency, hematuria, flank pain and difficulty urinating.  Endocrine: Denies hot or cold intolerance, polyuria, polydipsia, and sweats. Musculoskeletal: Denies myalgias, back pain, joint swelling, arthralgias and gait problem.  Skin: Denies pallor, rash and wounds.  Neurological: Positive for recent episode of lightheadedness and tremors associated w/ hypoglycemia. Denies dizziness, seizures, syncope, weakness, numbness and headaches.  Psychiatric/Behavioral: Denies mood changes, confusion, nervousness, sleep disturbance and agitation.  Objective:   Physical Exam: Filed Vitals:   09/27/13 0950  BP: 134/81  Pulse: 70  Temp: 97.6 F (36.4 C)  TempSrc: Oral  Height: 5\' 5"  (1.651 m)  Weight: 251 lb 14.4 oz (114.261 kg)  SpO2: 97%   General: Vital signs reviewed.  Patient is a well-developed and well-nourished, in no acute distress and cooperative with exam. Alert and oriented x3.  Head: Normocephalic and atraumatic. Eyes: PERRL, EOMI, conjunctivae normal, No scleral icterus. Patient wears glasses. Neck: Supple, trachea midline, normal ROM, No JVD, masses, thyromegaly, or carotid bruit present.  Cardiovascular: RRR, S1 normal, S2 normal, no murmurs, gallops, or rubs. Pulmonary/Chest: Normal respiratory effort, CTAB, no wheezes, rales, or rhonchi. Abdominal: Soft. Non-tender, non-distended, bowel sounds are normal, no masses, organomegaly, or guarding present.  Musculoskeletal: No joint deformities, erythema, or stiffness. Extremities: No swelling or edema,  pulses symmetric and intact bilaterally. No cyanosis or clubbing. Neurological: A&O x3, Strength is normal and symmetric bilaterally, cranial nerve II-XII are grossly intact, no focal motor deficit, sensory intact to light touch bilaterally.  Skin: Warm, dry and intact. No rashes or erythema. Psychiatric: Normal mood and affect. speech and behavior is  normal. Cognition and memory are normal.   Assessment & Plan:   Please see problem-based assessment and plan.

## 2013-09-27 NOTE — Assessment & Plan Note (Signed)
Well controlled today, 134/81.  -Patient started taking her Lisinopril 10 mg qd regularly again.

## 2013-09-27 NOTE — Progress Notes (Signed)
I saw and evaluated the patient.  I personally confirmed the key portions of the history and exam documented by Dr. Jones and I reviewed pertinent patient test results.  The assessment, diagnosis, and plan were formulated together and I agree with the documentation in the resident's note.   

## 2013-09-27 NOTE — Patient Instructions (Signed)
1. Please schedule a follow up appointment for 4 weeks. PLEASE OBTAIN A METER AND CHECK YOUR BLOOD SUGARS AT LEAST @x  DAILY BEFORE NEXT APPOINTMENT  2. Please take all medications as prescribed. Take Metformin 1000 mg once daily. Take Glipizide 10 mg once in the AM with a meal. Continue 70/30 insulin as previously directed.  3. If you have worsening of your symptoms or new symptoms arise, please call the clinic ([phone]), or go to the ER immediately if symptoms are severe.  You have done a great job in taking all your medications. I appreciate it very much. Please continue doing that.

## 2013-09-27 NOTE — Assessment & Plan Note (Signed)
Patient last in clinic on 09/10/13. Had not been taking any of her DM medications at that time, HbA1c was 12.0, up from 9.5 in 01/2013.  -Patient has restarted her meds since her last visit. She is currently taking Metformin 1000 mg qd (was supposed to take bid, but it gives her abdominal pain and diarrhea), Glipizide 10 mg bid , and Insulin 70/30, 40 units in AM/PM.  -Still not checking her blood sugars as she claims she cannot afford a meter. Had an episode of hypoglycemia last week, borrowed her friends' meter to check blood sugar, was 76. Had tremors and lightheadedness at that time (in AM).  -Will continue w/ Metformin 1000 mg qd + Insulin 70/30 bid.  -Will decrease Glipizide to 10 mg qd as opposed to 10 mg bid.  -No further adjustments at this time. Patient assures me that she will obtain a meter this week after she gets paid.  -Will return in 4 weeks for follow up and hopefully blood sugar readings from home.

## 2013-09-27 NOTE — Assessment & Plan Note (Signed)
Completely resolved after Diflucan 150 mg once. No complaints of itching, burning, dysuria, increased frequency, urgency, or hematuria.

## 2013-10-25 ENCOUNTER — Encounter: Payer: Self-pay | Admitting: Internal Medicine

## 2013-10-25 ENCOUNTER — Ambulatory Visit (INDEPENDENT_AMBULATORY_CARE_PROVIDER_SITE_OTHER): Payer: Self-pay | Admitting: Internal Medicine

## 2013-10-25 VITALS — BP 121/73 | HR 79 | Temp 97.7°F | Ht 68.0 in | Wt 245.4 lb

## 2013-10-25 DIAGNOSIS — Z0000 Encounter for general adult medical examination without abnormal findings: Secondary | ICD-10-CM

## 2013-10-25 DIAGNOSIS — I10 Essential (primary) hypertension: Secondary | ICD-10-CM

## 2013-10-25 DIAGNOSIS — E119 Type 2 diabetes mellitus without complications: Secondary | ICD-10-CM

## 2013-10-25 DIAGNOSIS — M2550 Pain in unspecified joint: Secondary | ICD-10-CM

## 2013-10-25 NOTE — Progress Notes (Signed)
Patient ID: Amy Norris, female   DOB: 07-23-1966, 47 y.o.   MRN: [id number]  Subjective:   Patient ID: Amy Norris female   DOB: [birth year] 47 y.o.   MRN: [id number]  HPI: [name] is a 47 y.o. F with PMH HTN, OSA, DM type II (last HbA1c 12.0 on 09/11/13), GERD, PCOS?, depression, and chronic back pain, presents to the clinic today c/o of RLE pain and R hand pain.  Her pain is in the R hip, knee, and ankle. It is intermittent and is non-radiating. She also c/o pain at her right 4th MCP joint that is intermittent. All of her pain has been present for about 1 week. She denies any pain currently, except at her right hip at the iliac crest. 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  She was seen 09/22/13 by her PCP and was asked to bring her glucometer, but the pt did not bring it today. She states that her CBGs are running in the lower 200s.   Past Medical History  Diagnosis Date  . Bronchitis   . Obesity   . GERD (gastroesophageal reflux disease)   . Diabetes mellitus     Type II  . Depression   . Sleep apnea, obstructive   . Herpes simplex type 1 infection     vaginal  . Menorrhagia   . Carpal tunnel syndrome, bilateral   . Renal calculi   . Hypokalemia     2/2 diarrhea  . Cholelithiasis   . Renal calculus   . Breast mass   . Anal fissure   . Polycystic ovarian disease   . Essential hypertension    Current Outpatient Prescriptions  Medication Sig Dispense Refill  . acetaminophen (TYLENOL) 500 MG tablet Take 2 tablets (1,000 mg total) by mouth every 8 (eight) hours as needed for pain.      . benzonatate (TESSALON) 100 MG capsule TAKE ONE CAPSULE BY MOUTH THREE TIMES DAILY AS NEEDED FOR COUGH  30 capsule  0  . FLUoxetine (PROZAC) 40 MG capsule Take 1 capsule (40 mg total) by mouth daily.  31 capsule  6  . glipiZIDE (GLUCOTROL) 10 MG tablet TAKE ONE TABLET BY MOUTH ONCE DAILY  W/ A MEAL.  60 tablet  2  . ibuprofen (ADVIL,MOTRIN) 800 MG tablet Take 1 tablet (800 mg total) by mouth every 8 (eight) hours as needed for pain.  30 tablet  0  . insulin NPH-regular (NOVOLIN 70/30 RELION) (70-30) 100 UNIT/ML injection Inject 40 Units into the skin 2 (two) times daily with a meal.  10 mL  12  . Insulin Syringe-Needle U-100 (B-D INS SYR ULTRAFINE 1CC/31G) 31G X 5/16" 1 ML MISC Use as directed  10 each  11  . Lancets MISC To test blood sugar once daily  100 each  11  . lisinopril (PRINIVIL,ZESTRIL) 10 MG tablet TAKE ONE TABLET BY MOUTH EVERY DAY  90 tablet  2  . metFORMIN (GLUCOPHAGE) 1000 MG tablet Take 1 tablet (1,000 mg total) by mouth every evening.  60 tablet  6  . pravastatin (PRAVACHOL) 40 MG tablet Take 1 tablet (40 mg total) by mouth every morning.  90 tablet  6  . glucose blood test strip Use as instructed  100 each  12   No current facility-administered medications for this visit.   Family History  Problem Relation Age  of Onset  . Hypertension Mother   . Diabetes Father    History   Social History  . Marital Status: Married    Spouse Name: N/A    Number of Children: N/A  . Years of Education: N/A   Occupational History  . Deli [employer]   Social History Main Topics  . Smoking status: Former Smoker    Types: Cigarettes    Quit date: 12/30/1978  . Smokeless tobacco: None  . Alcohol Use: 0.0 oz/week     Comment: 1-2 beers on Friday and Saturday.  . Drug Use: No  . Sexual Activity: None   Other Topics Concern  . None   Social History Narrative    Smoked many years ago drinks 1-2 beers on Friday and Saturdays no illegal drug use. Married and lives with her husband and sister-in-law son daughter and grandson.   Financial assistance approved for 100% discount at [facility] and has [employer] card per Rudell Cobb as of August 06, 2010 6:27 PM   Review of Systems: Constitutional: Denies fever, chills, or fatigue.   Respiratory: Denies SOB, DOE, cough, chest  tightness,  and wheezing.   Cardiovascular: Denies chest pain or leg swelling.  Gastrointestinal: Denies nausea, vomiting, abdominal pain, diarrhea, constipation, blood in stool and abdominal distention.  Genitourinary: Denies dysuria, urgency, frequency, hematuria, flank pain and difficulty urinating.  Musculoskeletal: + pain in R hip, knee, and ankle. Denies any radiating pain.  Skin: Denies wound.  A 12 point ROS was performed. All other ROS was negative.   Objective:  Physical Exam: Filed Vitals:   10/25/13 1542  BP: 121/73  Pulse: 79  Temp: 97.7 F (36.5 C)  TempSrc: Oral  Height: 5\' 8"  (1.727 m)  Weight: 245 lb 6.4 oz (111.313 kg)  SpO2: 98%   Constitutional: Vital signs reviewed.  Patient is a well-developed and well-nourished female in no acute distress and cooperative with exam.  Head: Normocephalic and atraumatic Eyes: PERRL, EOMI, conjunctivae normal, No scleral icterus.  Cardiovascular: RRR, S1 normal, S2 normal, no MRG, pulses symmetric and intact bilaterally Pulmonary/Chest: normal respiratory effort, CTAB, no wheezes, rales, or rhonchi Abdominal: Soft. Non-tender, non-distended Musculoskeletal: No joint deformities, erythema, or stiffness, ROM full and no nontender Neurological: A&O x3, Strength is normal and symmetric bilaterally, cranial nerve II-XII are grossly intact, no focal motor deficit, sensory intact to light touch bilaterally.  Skin: Warm, dry and intact. No rash, cyanosis, or clubbing.  Psychiatric: Normal mood and affect. speech and behavior is normal.   Assessment & Plan:   Please refer to Problem List based Assessment and Plan

## 2013-10-25 NOTE — Assessment & Plan Note (Addendum)
Pt c/o pain in her right hip, knee, and ankle present for the past week. Her pain is intermittent. She denies any trauma to his joints, except the right ankle which she states she feels she has sprained a number of times in the past. She denies any pain in her knee or ankle today, but does endorse some pain in her hip. She does stand for the majority of the day at work. She works at [employer], and is on her feet for roughly 7 hours a day. Encouraged her to try stretching exercises, and strengthening exercises for her hip, knee, and ankles. We'll see how these do for her. She does have ibuprofen at home that she can take for pain as needed.

## 2013-10-25 NOTE — Assessment & Plan Note (Signed)
BP Readings from Last 3 Encounters:  10/25/13 121/73  09/27/13 134/81  09/10/13 129/84    Lab Results  Component Value Date   NA 133* 09/10/2013   K 3.9 09/10/2013   CREATININE 0.89 09/10/2013    Assessment: Blood pressure control: controlled Progress toward BP goal:  at goal   Plan: Medications:  continue current medications Educational resources provided: handout Self management tools provided: home blood pressure logbook

## 2013-10-25 NOTE — Assessment & Plan Note (Addendum)
Lab Results  Component Value Date   HGBA1C 12.0 09/10/2013   HGBA1C 9.5 02/16/2013   HGBA1C 8.3 09/14/2012     Assessment: Diabetes control: poor control (HgbA1C >9%) Progress toward A1C goal:  unchanged Comments: Pt w/ CBGs around lower 200s. She did not bring her meter today.  Plan: Medications:  continue current medications Home glucose monitoring: Frequency: 2 times a day Timing: before meals Instruction/counseling given: reminded to bring blood glucose meter & log to each visit and reminded to bring medications to each visit Educational resources provided: handout Self management tools provided:   Other plans: She is to f/u in 2 days and is to bring her glucometer so we can see what her CBGs have actually been running. Once I see her CBGs, I will adjust her meds if needed.  Also, she needs an eye exam, but does not currently have insurance, will address on Wed.

## 2013-10-25 NOTE — Patient Instructions (Signed)
Back Exercises Back exercises help treat and prevent back injuries. The goal of back exercises is to increase the strength of your abdominal and back muscles and the flexibility of your back. These exercises should be started when you no longer have back pain. Back exercises include:  Pelvic Tilt. Lie on your back with your knees bent. Tilt your pelvis until the lower part of your back is against the floor. Hold this position 5 to 10 sec and repeat 5 to 10 times.  Knee to Chest. Pull first 1 knee up against your chest and hold for 20 to 30 seconds, repeat this with the other knee, and then both knees. This may be done with the other leg straight or bent, whichever feels better.  Sit-Ups or Curl-Ups. Bend your knees 90 degrees. Start with tilting your pelvis, and do a partial, slow sit-up, lifting your trunk only 30 to 45 degrees off the floor. Take at least 2 to 3 seconds for each sit-up. Do not do sit-ups with your knees out straight. If partial sit-ups are difficult, simply do the above but with only tightening your abdominal muscles and holding it as directed.  Hip-Lift. Lie on your back with your knees flexed 90 degrees. Push down with your feet and shoulders as you raise your hips a couple inches off the floor; hold for 10 seconds, repeat 5 to 10 times.  Back arches. Lie on your stomach, propping yourself up on bent elbows. Slowly press on your hands, causing an arch in your low back. Repeat 3 to 5 times. Any initial stiffness and discomfort should lessen with repetition over time.  Shoulder-Lifts. Lie face down with arms beside your body. Keep hips and torso pressed to floor as you slowly lift your head and shoulders off the floor. Do not overdo your exercises, especially in the beginning. Exercises may cause you some mild back discomfort which lasts for a few minutes; however, if the pain is more severe, or lasts for more than 15 minutes, do not continue exercises until you see your caregiver.  Improvement with exercise therapy for back problems is slow.  See your caregivers for assistance with developing a proper back exercise program. Document Released: 01/23/2005 Document Revised: 03/09/2012 Document Reviewed: 10/17/2011 [facility] Patient Information 2014 [place], [place]. Ankle Exercises for Rehabilitation Following ankle injuries, it is as important to follow your caregivers instructions for regaining full use of your ankle as it was to follow the initial treatment plan following the injury. The following are some suggestions for exercises and treatment, which can be done to help you regain full use of your ankle as soon as possible.  Follow all instructions regarding physical therapy.  Before exercising, it may be helpful to use heat on the muscles or joint being exercised. This loosens up the muscles and tendons (cord like structure) and decreases chances of injury during your exercises. If this is not possible just begin your exercises slowly to gradually warm up.  Stand on your toes several times per dayto strengthen the calf muscles. These are the muscles in the back of your leg between the knee and the heel. The cord you can feel just above the heel is the Achilles tendon. Rise up on your toes several times repeating this three to four times per day. Do not exercise to the point of pain. If pain starts to develop, decrease the exercise until you are comfortable again.  Do range of motion exercises. This means moving the ankle in all directions.  Practice writing the alphabet with your toes in the air. Do not increase beyond a range that is comfortable.  Increase the strength of the muscles in the front of your leg by raising your toes and foot straight up in the air. Repeat this exercise as you did the calf exercise with the same warnings. This also help to stretch your muscles.  Stretch your calf muscles also by leaning against a wall with your hands in front of you. Put your feet  a few feet from the wall and bend your knees until you feel the muscles in your calves become tight.  After exercising it may be helpful to put ice on the ankle to prevent swelling and improve rehabilitation. This may be done for 15 to 20 minutes following your exercises. If exercising is being done in the work place, this may not always be possible.  Taping an ankle injury may be helpful to give added support following an injury. It also may help prevent re-injury. This may be true if you are in training or in a conditioning program. You and your caregiver can decide on the best course of action to follow. Document Released: 12/13/2000 Document Revised: 03/09/2012 Document Reviewed: 12/10/2008 [facility] Patient Information 2014 [place], [place]. Knee Exercises EXERCISES RANGE OF MOTION(ROM) AND STRETCHING EXERCISES These exercises may help you when beginning to rehabilitate your injury. Your symptoms may resolve with or without further involvement from your physician, physical therapist or athletic trainer. While completing these exercises, remember:   Restoring tissue flexibility helps normal motion to return to the joints. This allows healthier, less painful movement and activity.  An effective stretch should be held for at least 30 seconds.  A stretch should never be painful. You should only feel a gentle lengthening or release in the stretched tissue. STRETCH - Knee Extension, Prone  Lie on your stomach on a firm surface, such as a bed or countertop. Place your right / left knee and leg just beyond the edge of the surface. You may wish to place a towel under the far end of your right / left thigh for comfort.  Relax your leg muscles and allow gravity to straighten your knee. Your clinician may advise you to add an ankle weight if more resistance is helpful for you.  You should feel a stretch in the back of your right / left knee. Hold this position for __________ seconds. Repeat __________  times. Complete this stretch __________ times per day. * Your physician, physical therapist or athletic trainer may ask you to add ankle weight to enhance your stretch.  RANGE OF MOTION - Knee Flexion, Active  Lie on your back with both knees straight. (If this causes back discomfort, bend your opposite knee, placing your foot flat on the floor.)  Slowly slide your heel back toward your buttocks until you feel a gentle stretch in the front of your knee or thigh.  Hold for __________ seconds. Slowly slide your heel back to the starting position. Repeat __________ times. Complete this exercise __________ times per day.  STRETCH - Quadriceps, Prone   Lie on your stomach on a firm surface, such as a bed or padded floor.  Bend your right / left knee and grasp your ankle. If you are unable to reach, your ankle or pant leg, use a belt around your foot to lengthen your reach.  Gently pull your heel toward your buttocks. Your knee should not slide out to the side. You should feel a stretch  in the front of your thigh and/or knee.  Hold this position for __________ seconds. Repeat __________ times. Complete this stretch __________ times per day.  STRETCH  Hamstrings, Supine   Lie on your back. Loop a belt or towel over the ball of your right / left foot.  Straighten your right / left knee and slowly pull on the belt to raise your leg. Do not allow the right / left knee to bend. Keep your opposite leg flat on the floor.  Raise the leg until you feel a gentle stretch behind your right / left knee or thigh. Hold this position for __________ seconds. Repeat __________ times. Complete this stretch __________ times per day.  STRENGTHENING EXERCISES These exercises may help you when beginning to rehabilitate your injury. They may resolve your symptoms with or without further involvement from your physician, physical therapist or athletic trainer. While completing these exercises, remember:   Muscles can  gain both the endurance and the strength needed for everyday activities through controlled exercises.  Complete these exercises as instructed by your physician, physical therapist or athletic trainer. Progress the resistance and repetitions only as guided.  You may experience muscle soreness or fatigue, but the pain or discomfort you are trying to eliminate should never worsen during these exercises. If this pain does worsen, stop and make certain you are following the directions exactly. If the pain is still present after adjustments, discontinue the exercise until you can discuss the trouble with your clinician. STRENGTH - Quadriceps, Isometrics  Lie on your back with your right / left leg extended and your opposite knee bent.  Gradually tense the muscles in the front of your right / left thigh. You should see either your knee cap slide up toward your hip or increased dimpling just above the knee. This motion will push the back of the knee down toward the floor/mat/bed on which you are lying.  Hold the muscle as tight as you can without increasing your pain for __________ seconds.  Relax the muscles slowly and completely in between each repetition. Repeat __________ times. Complete this exercise __________ times per day.  STRENGTH - Quadriceps, Short Arcs   Lie on your back. Place a __________ inch towel roll under your knee so that the knee slightly bends.  Raise only your lower leg by tightening the muscles in the front of your thigh. Do not allow your thigh to rise.  Hold this position for __________ seconds. Repeat __________ times. Complete this exercise __________ times per day.  OPTIONAL ANKLE WEIGHTS: Begin with ____________________, but DO NOT exceed ____________________. Increase in 1 pound/0.5 kilogram increments.  STRENGTH - Quadriceps, Straight Leg Raises  Quality counts! Watch for signs that the quadriceps muscle is working to insure you are strengthening the correct muscles  and not "cheating" by substituting with healthier muscles.  Lay on your back with your right / left leg extended and your opposite knee bent.  Tense the muscles in the front of your right / left thigh. You should see either your knee cap slide up or increased dimpling just above the knee. Your thigh may even quiver.  Tighten these muscles even more and raise your leg 4 to 6 inches off the floor. Hold for __________ seconds.  Keeping these muscles tense, lower your leg.  Relax the muscles slowly and completely in between each repetition. Repeat __________ times. Complete this exercise __________ times per day.  STRENGTH - Hamstring, Curls  Lay on your stomach with your legs  extended. (If you lay on a bed, your feet may hang over the edge.)  Tighten the muscles in the back of your thigh to bend your right / left knee up to 90 degrees. Keep your hips flat on the bed/floor.  Hold this position for __________ seconds.  Slowly lower your leg back to the starting position. Repeat __________ times. Complete this exercise __________ times per day.  OPTIONAL ANKLE WEIGHTS: Begin with ____________________, but DO NOT exceed ____________________. Increase in 1 pound/0.5 kilogram increments.  STRENGTH  Quadriceps, Squats  Stand in a door frame so that your feet and knees are in line with the frame.  Use your hands for balance, not support, on the frame.  Slowly lower your weight, bending at the hips and knees. Keep your lower legs upright so that they are parallel with the door frame. Squat only within the range that does not increase your knee pain. Never let your hips drop below your knees.  Slowly return upright, pushing with your legs, not pulling with your hands. Repeat __________ times. Complete this exercise __________ times per day.  STRENGTH - Quadriceps, Wall Slides  Follow guidelines for form closely. Increased knee pain often results from poorly placed feet or knees.  Lean against  a smooth wall or door and walk your feet out 18-24 inches. Place your feet hip-width apart.  Slowly slide down the wall or door until your knees bend __________ degrees.* Keep your knees over your heels, not your toes, and in line with your hips, not falling to either side.  Hold for __________ seconds. Stand up to rest for __________ seconds in between each repetition. Repeat __________ times. Complete this exercise __________ times per day. * Your physician, physical therapist or athletic trainer will alter this angle based on your symptoms and progress. Document Released: 10/30/2005 Document Revised: 03/09/2012 Document Reviewed: 03/30/2009 [facility] Patient Information 2014 [place], [place].

## 2013-10-27 ENCOUNTER — Ambulatory Visit: Payer: Self-pay | Admitting: Internal Medicine

## 2013-10-29 NOTE — Progress Notes (Signed)
Case discussed with Dr. Glenn at the time of the visit.  We reviewed the resident's history and exam and pertinent patient test results.  I agree with the assessment, diagnosis, and plan of care documented in the resident's note.   

## 2013-11-24 ENCOUNTER — Other Ambulatory Visit: Payer: Self-pay | Admitting: *Deleted

## 2013-11-24 MED ORDER — BENZONATATE 100 MG PO CAPS: ORAL_CAPSULE | ORAL | Status: DC

## 2013-11-29 ENCOUNTER — Ambulatory Visit (INDEPENDENT_AMBULATORY_CARE_PROVIDER_SITE_OTHER): Payer: Self-pay | Admitting: Internal Medicine

## 2013-11-29 ENCOUNTER — Encounter: Payer: Self-pay | Admitting: Internal Medicine

## 2013-11-29 VITALS — BP 117/77 | HR 75 | Temp 98.4°F | Ht 65.5 in | Wt 251.7 lb

## 2013-11-29 DIAGNOSIS — J3489 Other specified disorders of nose and nasal sinuses: Secondary | ICD-10-CM

## 2013-11-29 DIAGNOSIS — R42 Dizziness and giddiness: Secondary | ICD-10-CM

## 2013-11-29 DIAGNOSIS — E119 Type 2 diabetes mellitus without complications: Secondary | ICD-10-CM

## 2013-11-29 DIAGNOSIS — I10 Essential (primary) hypertension: Secondary | ICD-10-CM

## 2013-11-29 DIAGNOSIS — R0981 Nasal congestion: Secondary | ICD-10-CM

## 2013-11-29 HISTORY — DX: Dizziness and giddiness: R42

## 2013-11-29 LAB — GLUCOSE, CAPILLARY: Glucose-Capillary: 95 mg/dL (ref 70–99)

## 2013-11-29 MED ORDER — BENZONATATE 100 MG PO CAPS: ORAL_CAPSULE | ORAL | Status: DC

## 2013-11-29 MED ORDER — GUAIFENESIN ER 600 MG PO TB12: 1200.0000 mg | ORAL_TABLET | Freq: Two times a day (BID) | ORAL | Status: DC

## 2013-11-29 NOTE — Assessment & Plan Note (Addendum)
Patient states she's been having dizziness, and lightheadedness with a full feeling in her head over the past 3 days that has been progressively worsening, especially today. She states she's felt like she's going to fall, but denies any falls. Despite reporting an elevated blood pressure earlier in the day, her blood pressure was very well controlled here in the clinic. On exam she has no focal neurological deficits, speech is normal, gait is normal, Romberg is negative, so my suspicions for a central process are low. She endorses increased nasal drainage, having to blow her nose and with an increased productive cough. I suspect this is the culprit of her symptoms. Starting Mucinex 600 mg 1-2 tablets twice a day when necessary congestion and am refilling Tessalon 100 mg 3 times a day when necessary for cough. She's to followup within the next 2 weeks, but she's to call the clinic if her symptoms worsen or do not improve over the next week.

## 2013-11-29 NOTE — Assessment & Plan Note (Addendum)
Lab Results  Component Value Date   HGBA1C 12.0 09/10/2013   HGBA1C 9.5 02/16/2013   HGBA1C 8.3 09/14/2012     Assessment: Diabetes control: poor control (HgbA1C >9%) Progress toward A1C goal:  unchanged Comments: Per patient, she been checking her blood sugars twice a day before meals, and was having episodes of hypoglycemia below 70. She was taking her 70/30 insulin twice a day, but with her hypoglycemic episodes she decreased her dose to 20 units twice a day. She did bring her meter in today, and her blood sugars have been running between 150-200. She did have one hypoglycemic episode 67.  Plan: Medications:  continue current medications, including metformin 1000 mg daily (patient states she did not tolerate taking this twice a day), glipizide 10 mg daily, but will increase her 70/30 insulin slowly to 25 units twice a day. Home glucose monitoring: Frequency: 2 times a day Timing: before meals Instruction/counseling given: reminded to bring blood glucose meter & log to each visit Educational resources provided:   Self management tools provided: copy of home glucose meter download Other plans: She is to continue to check her sugars at least twice a day before meals, and is to bring her meter back to the clinic in 2 weeks, and at that time we can up titrate her insulin as needed, and we will check her A1c at that time. She is also due for her eye exam, but unfortunate has no insurance at this time. She's to meet with Chauncey Reading at some point soon to discuss the orange card. When she has this, she can be scheduled for a retinal exam.

## 2013-11-29 NOTE — Assessment & Plan Note (Signed)
BP Readings from Last 3 Encounters:  11/29/13 117/77  10/25/13 121/73  09/27/13 134/81    Lab Results  Component Value Date   NA 133* 09/10/2013   K 3.9 09/10/2013   CREATININE 0.89 09/10/2013    Assessment: Blood pressure control: controlled Progress toward BP goal:  at goal Comments: Her blood pressure looks excellent today  Plan: Medications:  continue current medications Educational resources provided:   Self management tools provided:   Other plans: Followup in 2 weeks

## 2013-11-29 NOTE — Patient Instructions (Signed)
Start taking Mucinex one to 2 tablets 2 times a day for your congestion. He can also take the [facility] for your cough. Please call the clinic if your symptoms do not improve, or if they worsen after beginning the Mucinex and the cough medication.  Please return to the clinic in 2 weeks, and bring your glucometer. Please try to check your sugars at least twice a day before meals for the next 2 weeks. Let's see how your sugars do taking the 25 units of the 70/30 twice a day. We will increase your insulin at your next visit if needed.

## 2013-11-29 NOTE — Progress Notes (Signed)
Patient ID: Amy Norris, female   DOB: May 18, 1966, 47 y.o.   MRN: [id number]  Subjective:   Patient ID: Amy Norris female   DOB: 01-15-1966 47 y.o.   MRN: [id number]  HPI: [name] is a 47 y.o. MH HTN, OSA, DM type II (last HbA1c 12.0 on 09/11/13), GERD, PCOS?, depression, and chronic back pain, presents to the clinic today for a blood pressure check.  She was seen by me on 10/25/13 and forgot her meter. She was supposed to return to the clinic 2 days later and bring her meter for an evaluation of her diabetes control. Unfortunately she did not follow up until today. She states that her BP was elevated the other day with an elevated diastolic value. She did bring her meter today and states that her blood sugars have been well controlled at home. Her CBG was 95 today. Patient states that her blood sugars have dropped below 70, and she decreased her normal 70/30 from 40 units twice a day to 20 units twice a day. Since that time she states that her blood sugars have ranged between the mid 100s to 200s.  Today she endorses dizziness and a full sensation within her head that has been occurring over the past 3 days, but has worsened today. 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 She denies any falls. She states that she went to the local fire house today because of her dizziness. Her blood pressure was checked and was found to be elevated to 160/120. She states it was rechecked 2 times and was found to be elevated each time. She states that over the past week she's been having increased drainage, having to blow her nose and coughing up whitish/yellow mucus.   Past Medical History  Diagnosis Date  . Bronchitis   . Obesity   . GERD (gastroesophageal reflux disease)   . Diabetes mellitus     Type II  . Depression   . Sleep apnea, obstructive   . Herpes simplex type 1 infection     vaginal  .  Menorrhagia   . Carpal tunnel syndrome, bilateral   . Renal calculi   . Hypokalemia     2/2 diarrhea  . Cholelithiasis   . Renal calculus   . Breast mass   . Anal fissure   . Polycystic ovarian disease   . Essential hypertension    Current Outpatient Prescriptions  Medication Sig Dispense Refill  . acetaminophen (TYLENOL) 500 MG tablet Take 2 tablets (1,000 mg total) by mouth every 8 (eight) hours as needed for pain.      . benzonatate (TESSALON) 100 MG capsule TAKE ONE CAPSULE BY MOUTH THREE TIMES DAILY AS NEEDED FOR COUGH  30 capsule  0  . FLUoxetine (PROZAC) 40 MG capsule Take 1 capsule (40 mg total) by mouth daily.  31 capsule  6  . glipiZIDE (GLUCOTROL) 10 MG tablet TAKE ONE TABLET BY MOUTH ONCE DAILY W/ A MEAL.  60 tablet  2  . glucose blood test strip Use as instructed  100 each  12  . ibuprofen (ADVIL,MOTRIN) 800 MG tablet Take 1 tablet (800 mg total) by mouth every 8 (eight) hours as needed for pain.  30 tablet  0  . insulin NPH-regular (NOVOLIN 70/30 RELION) (70-30) 100 UNIT/ML injection Inject 40 Units into the skin 2 (two) times daily with a meal.  10 mL  12  . Insulin Syringe-Needle U-100 (B-D INS SYR ULTRAFINE 1CC/31G) 31G X 5/16" 1 ML MISC Use as directed  10 each  11  . Lancets MISC To test blood sugar once daily  100 each  11  . lisinopril (PRINIVIL,ZESTRIL) 10 MG tablet TAKE ONE TABLET BY MOUTH EVERY DAY  90 tablet  2  . metFORMIN (GLUCOPHAGE) 1000 MG tablet Take 1 tablet (1,000 mg total) by mouth every evening.  60 tablet  6  . pravastatin (PRAVACHOL) 40 MG tablet Take 1 tablet (40 mg total) by mouth every morning.  90 tablet  6   No current facility-administered medications for this visit.   Family History  Problem Relation Age of Onset  . Hypertension Mother   . Diabetes Father    History   Social History  . Marital Status: Married    Spouse Name: N/A    Number of Children: N/A  . Years of Education: N/A   Occupational History  . [employer]    Social History Main Topics  . Smoking status: Former Smoker    Types: Cigarettes    Quit date: 12/30/1978  . Smokeless tobacco: None  . Alcohol Use: No  . Drug Use: No  . Sexual Activity: None   Other Topics Concern  . None   Social History Narrative    Smoked many years ago drinks 1-2 beers on Friday and Saturdays no illegal drug use. Married and lives with her husband and sister-in-law son daughter and grandson.   Financial assistance approved for 100% discount at [facility] and has [facility] card per Rudell Cobb as of August 06, 2010 6:27 PM   Review of Systems: Constitutional: Denies fever, chills, diaphoresis  HEENT: Denies eye pain, redness, hearing loss, ear pain, sore throat, or facial pain. +nasal congestion, productive cough with yellow mucus, and a full feeling within her head Respiratory: +productive cough   Cardiovascular: Denies chest pain Gastrointestinal: Denies nausea, vomiting, abdominal pain, diarrhea, constipation Musculoskeletal: Denies difficulty ambulating Neurological:+dizziness and light headedness. Denies syncope or falls, weakness, numbness or headaches.  Psychiatric/Behavioral: Denies suicidal ideation, mood changes  Objective:  Physical Exam: Filed Vitals:   11/29/13 1437  BP: 117/77  Pulse: 75  Temp: 98.4 F (36.9 C)  TempSrc: Oral  Height: 5' 5.5" (1.664 m)  Weight: 251 lb 11.2 oz (114.17 kg)  SpO2: 97%   Constitutional: Vital signs reviewed.  Patient is a well-developed and well-nourished female in no acute distress and cooperative with exam.  Head: Normocephalic and atraumatic, no facial tenderness to palpation Ear: TM normal bilaterally Eyes: PERRL, EOMI, conjunctivae normal, No scleral icterus.  Neck: Supple, Trachea midline normal ROM Cardiovascular: RRR, no MRG, pulses symmetric and intact bilaterally Pulmonary/Chest: normal respiratory effort, CTAB, no wheezes, rales, or rhonchi Abdominal: Soft. Non-tender, non-distended, bowel sounds are  normal Musculoskeletal: No joint deformities, erythema, or stiffness, ROM full and no nontender Neurological: A&O x3, Strength is normal and symmetric bilaterally, cranial nerve II-XII are grossly intact, no focal motor deficit, sensory intact to light touch bilaterally. Romberg negative. Normal gait.  Psychiatric: Normal mood and affect.    Assessment & Plan:   Please refer to Problem List based Assessment and Plan

## 2013-12-01 NOTE — Progress Notes (Signed)
Case discussed with Dr. Glenn soon after the resident saw the patient.  We reviewed the resident's history and exam and pertinent patient test results.  I agree with the assessment, diagnosis, and plan of care documented in the resident's note. 

## 2013-12-14 ENCOUNTER — Ambulatory Visit: Payer: Self-pay | Admitting: Internal Medicine

## 2014-01-10 ENCOUNTER — Other Ambulatory Visit: Payer: Self-pay | Admitting: *Deleted

## 2014-01-10 DIAGNOSIS — E119 Type 2 diabetes mellitus without complications: Secondary | ICD-10-CM

## 2014-01-11 MED ORDER — GLIPIZIDE 10 MG PO TABS: ORAL_TABLET | ORAL | Status: DC

## 2014-01-21 ENCOUNTER — Telehealth: Payer: Self-pay | Admitting: *Deleted

## 2014-01-21 NOTE — Telephone Encounter (Signed)
You approved a refill for glipizide 10 mg on 1/12 with directions to take once daily.  All other Rx were for twice a day.  I called pt and she states she takes it twice a day. Do you want to change the directions?  If so please resend electronically to pharmacy.

## 2014-01-24 NOTE — Telephone Encounter (Signed)
During last visit in the clinic, patient was changed to Glipizide 10 mg qd b/c of hypoglycemic episodes. She should start taking only 10 mg qd and be seen in the clinic in 2-4 weeks for follow up involving her DM management.

## 2014-01-24 NOTE — Telephone Encounter (Signed)
I talked with Dr Yetta BarreJones and He states directions were changed to once a day at last visit.  Pt informed and voices understanding.

## 2014-02-04 ENCOUNTER — Encounter: Payer: Self-pay | Admitting: Internal Medicine

## 2014-02-04 ENCOUNTER — Ambulatory Visit (INDEPENDENT_AMBULATORY_CARE_PROVIDER_SITE_OTHER): Payer: Self-pay | Admitting: Internal Medicine

## 2014-02-04 ENCOUNTER — Ambulatory Visit (HOSPITAL_COMMUNITY)
Admission: RE | Admit: 2014-02-04 | Discharge: 2014-02-04 | Disposition: A | Discharge status: Home / Self Care | Payer: Self-pay | Source: Ambulatory Visit | Attending: Internal Medicine | Admitting: Internal Medicine

## 2014-02-04 VITALS — BP 130/86 | HR 80 | Temp 98.1°F | Ht 65.5 in | Wt 248.5 lb

## 2014-02-04 DIAGNOSIS — X58XXXA Exposure to other specified factors, initial encounter: Secondary | ICD-10-CM | POA: Insufficient documentation

## 2014-02-04 DIAGNOSIS — M7989 Other specified soft tissue disorders: Secondary | ICD-10-CM

## 2014-02-04 DIAGNOSIS — M79644 Pain in right finger(s): Secondary | ICD-10-CM

## 2014-02-04 DIAGNOSIS — M779 Enthesopathy, unspecified: Secondary | ICD-10-CM | POA: Insufficient documentation

## 2014-02-04 DIAGNOSIS — M6530 Trigger finger, unspecified finger: Secondary | ICD-10-CM | POA: Insufficient documentation

## 2014-02-04 DIAGNOSIS — M79609 Pain in unspecified limb: Secondary | ICD-10-CM | POA: Insufficient documentation

## 2014-02-04 DIAGNOSIS — M79645 Pain in left finger(s): Secondary | ICD-10-CM | POA: Insufficient documentation

## 2014-02-04 MED ORDER — HYDROCODONE-ACETAMINOPHEN 5-325 MG PO TABS: 1.0000 | ORAL_TABLET | Freq: Four times a day (QID) | ORAL | Status: DC | PRN

## 2014-02-04 NOTE — Assessment & Plan Note (Addendum)
Patient with pain, swelling, erythema and a shallow laceration to her distal right fourth finger after known injury yesterday. X-ray of her R hand showed no acute bony abnormality. Laceration is shallow, no need for sutures. No signs of infection. I told her she can buddy tape her third and fourth fingers together if that is more comfortable for her, though this is not necessary. I gave her Norco 5-325mg  q6h prn, # 15.   Patient given a letter to excuse her from work for the next 3 days to allow for healing.

## 2014-02-04 NOTE — Patient Instructions (Addendum)
Thank you for your visit.  I hope your finger feels better soon.  I wrote you a work note to excuse you for three days of work (Until 02/08/14).   Please follow up with your PCP as previously scheduled or sooner if your finger pain worsens or if you develop a fever/chills.

## 2014-02-04 NOTE — Progress Notes (Signed)
Patient ID: Amy Norris, female   DOB: [birth year], 48 y.o.   MRN: [id number] HPI The patient is a 48 y.o. female with a history of HTN, OSA, GERD, DM type 2 who presents for an acute visit.  Patient presents with a complaint of right ring finger pain that began yesterday after her husband accidentally dropped a tool box on her right finger as they were moving it into his truck. Patient says that her range of motion is decreased and she has significant swelling and redness to her distal finger that worsened this morning when she woke up. Patient has been taking Tylenol which helps somewhat with the pain. Her tetanus is up-to-date. No F/C. 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   ROS: General: no fevers, chills Skin: no rash Pulm: no dyspnea, coughing, wheezing CV: no chest pain, palpitations, shortness of breath Abd: no abdominal pain, nausea/vomiting, diarrhea/constipation Ext: redness, swelling, tenderness to R ring finger Neuro: some numbness to R ring finger  Filed Vitals:   02/04/14 1444  BP: 130/86  Pulse: 80  Temp: 98.1 F (36.7 C)   Physical Exam General: alert, cooperative, and in no apparent distress HEENT: NCAT, vision grossly intact  Neck: supple, no lymphadenopathy, JVD, or carotid bruits Lungs: clear to ascultation bilaterally Heart: regular rate and rhythm Abdomen: soft, non-tender, normal bowel sounds Extremities: there is erythema, swelling, and TTP over R distal fourth finger; TTP is worst over DIP in the anterior/posterior direction; ROM is limited to R fourth DIP 2/2 both pain and swelling; sensation is grossly intact to R fourth finger; there is a linear 1cm shallow laceration w/ minimal dried blood to distal R fourth finger pad Neurologic: alert & oriented X3  Current Outpatient Prescriptions on File Prior to Visit  Medication Sig Dispense Refill   . acetaminophen (TYLENOL) 500 MG tablet Take 2 tablets (1,000 mg total) by mouth every 8 (eight) hours as needed for pain.      . benzonatate (TESSALON) 100 MG capsule TAKE ONE CAPSULE BY MOUTH THREE TIMES DAILY AS NEEDED FOR COUGH  30 capsule  2  . FLUoxetine (PROZAC) 40 MG capsule Take 1 capsule (40 mg total) by mouth daily.  31 capsule  6  . glipiZIDE (GLUCOTROL) 10 MG tablet TAKE ONE TABLET BY MOUTH ONCE DAILY W/ A MEAL.  180 tablet  1  . glucose blood test strip Use as instructed  100 each  12  . guaiFENesin (MUCINEX) 600 MG 12 hr tablet Take 2 tablets (1,200 mg total) by mouth 2 (two) times daily.  180 tablet  2  . ibuprofen (ADVIL,MOTRIN) 800 MG tablet Take 1 tablet (800 mg total) by mouth every 8 (eight) hours as needed for pain.  30 tablet  0  . insulin NPH-regular (NOVOLIN 70/30 RELION) (70-30) 100 UNIT/ML injection Inject 40 Units into the skin 2 (two) times daily with a meal.  10 mL  12  . Insulin Syringe-Needle U-100 (B-D INS SYR ULTRAFINE 1CC/31G) 31G X 5/16" 1 ML MISC Use as directed  10 each  11  . Lancets MISC To test blood sugar once daily  100 each  11  . lisinopril (PRINIVIL,ZESTRIL) 10 MG tablet TAKE ONE TABLET BY MOUTH EVERY DAY  90 tablet  2  . metFORMIN (GLUCOPHAGE) 1000 MG tablet Take 1 tablet (1,000 mg total) by mouth every evening.  60 tablet  6  . pravastatin (PRAVACHOL) 40 MG tablet Take 1 tablet (40 mg total) by mouth every morning.  90 tablet  6   No current facility-administered medications on file prior to visit.    Assessment/Plan

## 2014-02-07 NOTE — Progress Notes (Signed)
Case discussed with Dr. Chikowski at the time of the visit.  We reviewed the resident's history and exam and pertinent patient test results.  I agree with the assessment, diagnosis, and plan of care documented in the resident's note. 

## 2014-03-09 ENCOUNTER — Emergency Department (HOSPITAL_COMMUNITY)
Admission: EM | Admit: 2014-03-09 | Discharge: 2014-03-09 | Disposition: A | Discharge status: Home / Self Care | Payer: Worker's Compensation | Attending: Emergency Medicine | Admitting: Emergency Medicine

## 2014-03-09 ENCOUNTER — Encounter (HOSPITAL_COMMUNITY): Payer: Self-pay | Admitting: Emergency Medicine

## 2014-03-09 ENCOUNTER — Emergency Department (HOSPITAL_COMMUNITY): Payer: Self-pay | Attending: Emergency Medicine

## 2014-03-09 DIAGNOSIS — E669 Obesity, unspecified: Secondary | ICD-10-CM | POA: Insufficient documentation

## 2014-03-09 DIAGNOSIS — S61419A Laceration without foreign body of unspecified hand, initial encounter: Secondary | ICD-10-CM

## 2014-03-09 DIAGNOSIS — Z794 Long term (current) use of insulin: Secondary | ICD-10-CM | POA: Insufficient documentation

## 2014-03-09 DIAGNOSIS — I10 Essential (primary) hypertension: Secondary | ICD-10-CM | POA: Insufficient documentation

## 2014-03-09 DIAGNOSIS — Z8619 Personal history of other infectious and parasitic diseases: Secondary | ICD-10-CM | POA: Insufficient documentation

## 2014-03-09 DIAGNOSIS — Z8669 Personal history of other diseases of the nervous system and sense organs: Secondary | ICD-10-CM | POA: Insufficient documentation

## 2014-03-09 DIAGNOSIS — Z79899 Other long term (current) drug therapy: Secondary | ICD-10-CM | POA: Insufficient documentation

## 2014-03-09 DIAGNOSIS — S61409A Unspecified open wound of unspecified hand, initial encounter: Secondary | ICD-10-CM | POA: Insufficient documentation

## 2014-03-09 DIAGNOSIS — W261XXA Contact with sword or dagger, initial encounter: Secondary | ICD-10-CM

## 2014-03-09 DIAGNOSIS — Z8742 Personal history of other diseases of the female genital tract: Secondary | ICD-10-CM | POA: Insufficient documentation

## 2014-03-09 DIAGNOSIS — E119 Type 2 diabetes mellitus without complications: Secondary | ICD-10-CM | POA: Insufficient documentation

## 2014-03-09 DIAGNOSIS — W260XXA Contact with knife, initial encounter: Secondary | ICD-10-CM | POA: Insufficient documentation

## 2014-03-09 DIAGNOSIS — F3289 Other specified depressive episodes: Secondary | ICD-10-CM | POA: Insufficient documentation

## 2014-03-09 DIAGNOSIS — Z23 Encounter for immunization: Secondary | ICD-10-CM | POA: Insufficient documentation

## 2014-03-09 DIAGNOSIS — Z8719 Personal history of other diseases of the digestive system: Secondary | ICD-10-CM | POA: Insufficient documentation

## 2014-03-09 DIAGNOSIS — Y929 Unspecified place or not applicable: Secondary | ICD-10-CM | POA: Insufficient documentation

## 2014-03-09 DIAGNOSIS — F329 Major depressive disorder, single episode, unspecified: Secondary | ICD-10-CM | POA: Insufficient documentation

## 2014-03-09 DIAGNOSIS — Y9389 Activity, other specified: Secondary | ICD-10-CM | POA: Insufficient documentation

## 2014-03-09 DIAGNOSIS — Z87891 Personal history of nicotine dependence: Secondary | ICD-10-CM | POA: Insufficient documentation

## 2014-03-09 DIAGNOSIS — Z87442 Personal history of urinary calculi: Secondary | ICD-10-CM | POA: Insufficient documentation

## 2014-03-09 DIAGNOSIS — Z8709 Personal history of other diseases of the respiratory system: Secondary | ICD-10-CM | POA: Insufficient documentation

## 2014-03-09 MED ORDER — TETANUS-DIPHTH-ACELL PERTUSSIS 5-2.5-18.5 LF-MCG/0.5 IM SUSP
0.5000 mL | Freq: Once | INTRAMUSCULAR | Status: AC
  Administered 2014-03-09: 0.5 mL via INTRAMUSCULAR
  Filled 2014-03-09: qty 0.5

## 2014-03-09 MED ORDER — OXYCODONE-ACETAMINOPHEN 5-325 MG PO TABS: 1.0000 | ORAL_TABLET | Freq: Four times a day (QID) | ORAL | Status: DC | PRN

## 2014-03-09 NOTE — ED Provider Notes (Signed)
Medical screening examination/treatment/procedure(s) were performed by non-physician practitioner and as supervising physician I was immediately available for consultation/collaboration.   EKG Interpretation None        Gwyneth SproutWhitney Leny Morozov, MD 03/09/14 2353

## 2014-03-09 NOTE — ED Notes (Signed)
Pt states she cut her hand while using a knife at work. Hand is bandage with bleeding controlled. Pt alert and orient.

## 2014-03-09 NOTE — ED Provider Notes (Signed)
CSN: [id number]     Arrival date & time 03/09/14  2041 History  This chart was scribed for Earley Favor, NP, working with Gwyneth Sprout, MD, by Ellin Mayhew, ED Scribe. This patient was seen in room WTR7/WTR7 and the patient's care was started at 10:50 PM.   The history is provided by the patient. No language interpreter was used.   HPI Comments: Amy Norris is a 48 y.o. female who presents to the Emergency Department with a chief complaint of R hand laceration with onset 2.5 hours ago. Patient states she was cutting bread with a serrated knife when she accidentally cut her hand. She characterizes her pain as a throbbing/sharp pain that is made worse with pressure/contact. She states her pain when bandaged is mild and when exposed/touched is severe. She denies any loss of sensation in her hand or fingers. Patient is unable to recall her last tetanus shot and has requested to have one tonight. She denies any other medical conditions or illnesses. Patient confirms an allergy to Vicodin and Metformin.    Past Medical History  Diagnosis Date  . Bronchitis   . Obesity   . GERD (gastroesophageal reflux disease)   . Diabetes mellitus     Type II  . Depression   . Sleep apnea, obstructive   . Herpes simplex type 1 infection     vaginal  . Menorrhagia   . Carpal tunnel syndrome, bilateral   . Renal calculi   . Hypokalemia     2/2 diarrhea  . Cholelithiasis   . Renal calculus   . Breast mass   . Anal fissure   . Polycystic ovarian disease   . Essential hypertension    Past Surgical History  Procedure Laterality Date  . Tubal ligation  1991    1991  . Cholecystectomy  11/08   Family History  Problem Relation Age of Onset  . Hypertension Mother   . Diabetes Father    History  Substance Use Topics  . Smoking status: Former Smoker    Types: Cigarettes    Quit date: 12/30/1978  . Smokeless tobacco: Not on file  . Alcohol Use: 0.0 oz/week   OB History   Grav Para Term Preterm  Abortions TAB SAB Ect Mult Living                 Review of Systems  Constitutional: Negative for fever, chills, activity change and appetite change.  Respiratory: Negative for shortness of breath.   Gastrointestinal: Negative for nausea and vomiting.  Skin: Positive for wound.       R hand laceration   Neurological: Negative for weakness.  All other systems reviewed and are negative.   Allergies  Vicodin and Metformin and related  Home Medications   Current Outpatient Rx  Name  Route  Sig  Dispense  Refill  . acetaminophen (TYLENOL) 500 MG tablet   Oral   Take 2 tablets (1,000 mg total) by mouth every 8 (eight) hours as needed for pain.         . benzonatate (TESSALON) 100 MG capsule      TAKE ONE CAPSULE BY MOUTH THREE TIMES DAILY AS NEEDED FOR COUGH   30 capsule   2   . FLUoxetine (PROZAC) 40 MG capsule   Oral   Take 1 capsule (40 mg total) by mouth daily.   31 capsule   6   . glipiZIDE (GLUCOTROL) 10 MG tablet      TAKE ONE TABLET  BY MOUTH ONCE DAILY W/ A MEAL.   180 tablet   1   . EXPIRED: glucose blood test strip      Use as instructed   100 each   12     Test your blood sugar 2-4 times daily   . guaiFENesin (MUCINEX) 600 MG 12 hr tablet   Oral   Take 2 tablets (1,200 mg total) by mouth 2 (two) times daily.   180 tablet   2   . HYDROcodone-acetaminophen (NORCO) 5-325 MG per tablet   Oral   Take 1 tablet by mouth every 6 (six) hours as needed for moderate pain.   15 tablet   0   . ibuprofen (ADVIL,MOTRIN) 800 MG tablet   Oral   Take 1 tablet (800 mg total) by mouth every 8 (eight) hours as needed for pain.   30 tablet   0   . insulin NPH-regular (NOVOLIN 70/30 RELION) (70-30) 100 UNIT/ML injection   Subcutaneous   Inject 40 Units into the skin 2 (two) times daily with a meal.   10 mL   12   . Insulin Syringe-Needle U-100 (B-D INS SYR ULTRAFINE 1CC/31G) 31G X 5/16" 1 ML MISC      Use as directed   10 each   11   . Lancets MISC       To test blood sugar once daily   100 each   11   . lisinopril (PRINIVIL,ZESTRIL) 10 MG tablet      TAKE ONE TABLET BY MOUTH EVERY DAY   90 tablet   2   . metFORMIN (GLUCOPHAGE) 1000 MG tablet   Oral   Take 1 tablet (1,000 mg total) by mouth every evening.   60 tablet   6     Please disregard other Rx of 500mg  bid. She needs  ...   . pravastatin (PRAVACHOL) 40 MG tablet   Oral   Take 1 tablet (40 mg total) by mouth every morning.   90 tablet   6    Triage Vitals: BP 145/83  Pulse 82  Temp(Src) 98.3 F (36.8 C) (Oral)  Resp 20  Ht 5\' 6"  (1.676 m)  Wt 245 lb (111.131 kg)  BMI 39.56 kg/m2  SpO2 97%  LMP 02/26/2014  Physical Exam  Nursing note and vitals reviewed. Constitutional: She is oriented to person, place, and time. She appears well-developed and well-nourished. No distress.  HENT:  Head: Normocephalic and atraumatic.  Eyes: EOM are normal.  Neck: Normal range of motion. Neck supple.  Cardiovascular: Normal rate.   Pulmonary/Chest: Effort normal. No respiratory distress.  Musculoskeletal: Normal range of motion. She exhibits no edema and no tenderness.  No neuro deficits.  Full range of motion of all digits, wrist  Neurological: She is alert and oriented to person, place, and time. No cranial nerve deficit or sensory deficit.  Skin: Skin is warm and dry. Laceration noted.  4cm laceration to the R hand extending from the angle of the wrist to towards the thumb.  Psychiatric: She has a normal mood and affect. Her behavior is normal.    ED Course  Procedures (including critical care time)  DIAGNOSTIC STUDIES: Oxygen Saturation is 97% on room air, normal by my interpretation.    COORDINATION OF CARE: 10:55 PM-Will apply local anesthetic and suture the wound.Treatment plan discussed with patient and patient agrees.  10:57 PM-LACERATION REPAIR PROCEDURE NOTE The patient's identification was confirmed and consent was obtained. This procedure was  performed by  Earley Favor, NP, at 10:57 PM. Site: R hand Sterile procedures observed Anesthetic used (type and amt): 2% lidocaine w/epi Suture type/size: interrupted 4L prolene Length: 4cm # of Sutures: 8 Technique:simple Complexity: simple Tetanus orderered Site anesthetized, irrigated with NS, explored without evidence of foreign body, wound well approximated, site covered with dry, sterile dressing.  Patient tolerated procedure well without complications. Instructions for care discussed verbally and patient provided with additional written instructions for homecare and f/u.  Labs Review Labs Reviewed - No data to display Imaging Review Dg Hand Complete Right  03/09/2014   CLINICAL DATA Right hand pain and laceration.  EXAM RIGHT HAND - COMPLETE 3+ VIEW  COMPARISON February 04, 2014.  FINDINGS There is no evidence of fracture or dislocation. There is no evidence of arthropathy or other focal bone abnormality. Soft tissues are unremarkable.  IMPRESSION Normal right hand.  SIGNATURE  Electronically Signed   By: Roque Lias M.D.   On: 03/09/2014 22:35     EKG Interpretation None      MDM   Final diagnoses:  None    I personally performed the services described in this documentation, which was scribed in my presence. The recorded information has been reviewed and is accurate.     Arman Filter, NP 03/09/14 [phone]

## 2014-03-18 ENCOUNTER — Encounter (HOSPITAL_COMMUNITY): Payer: Self-pay | Admitting: Emergency Medicine

## 2014-03-18 ENCOUNTER — Emergency Department (HOSPITAL_COMMUNITY)
Admission: EM | Admit: 2014-03-18 | Discharge: 2014-03-18 | Disposition: A | Discharge status: Home / Self Care | Payer: Worker's Compensation | Attending: Emergency Medicine | Admitting: Emergency Medicine

## 2014-03-18 DIAGNOSIS — Z8669 Personal history of other diseases of the nervous system and sense organs: Secondary | ICD-10-CM | POA: Insufficient documentation

## 2014-03-18 DIAGNOSIS — Z87442 Personal history of urinary calculi: Secondary | ICD-10-CM | POA: Insufficient documentation

## 2014-03-18 DIAGNOSIS — I10 Essential (primary) hypertension: Secondary | ICD-10-CM | POA: Insufficient documentation

## 2014-03-18 DIAGNOSIS — Z8659 Personal history of other mental and behavioral disorders: Secondary | ICD-10-CM | POA: Insufficient documentation

## 2014-03-18 DIAGNOSIS — E669 Obesity, unspecified: Secondary | ICD-10-CM | POA: Insufficient documentation

## 2014-03-18 DIAGNOSIS — Z8742 Personal history of other diseases of the female genital tract: Secondary | ICD-10-CM | POA: Insufficient documentation

## 2014-03-18 DIAGNOSIS — Z794 Long term (current) use of insulin: Secondary | ICD-10-CM | POA: Insufficient documentation

## 2014-03-18 DIAGNOSIS — Z79899 Other long term (current) drug therapy: Secondary | ICD-10-CM | POA: Insufficient documentation

## 2014-03-18 DIAGNOSIS — Z4802 Encounter for removal of sutures: Secondary | ICD-10-CM | POA: Insufficient documentation

## 2014-03-18 DIAGNOSIS — Z8619 Personal history of other infectious and parasitic diseases: Secondary | ICD-10-CM | POA: Insufficient documentation

## 2014-03-18 DIAGNOSIS — Z8709 Personal history of other diseases of the respiratory system: Secondary | ICD-10-CM | POA: Insufficient documentation

## 2014-03-18 DIAGNOSIS — Z87891 Personal history of nicotine dependence: Secondary | ICD-10-CM | POA: Insufficient documentation

## 2014-03-18 DIAGNOSIS — Z8719 Personal history of other diseases of the digestive system: Secondary | ICD-10-CM | POA: Insufficient documentation

## 2014-03-18 DIAGNOSIS — E119 Type 2 diabetes mellitus without complications: Secondary | ICD-10-CM | POA: Insufficient documentation

## 2014-03-18 NOTE — ED Notes (Signed)
Per pt here to have sutures removed from left palm of hand

## 2014-03-18 NOTE — Discharge Instructions (Signed)
Please call your doctor for a followup appointment within 24-48 hours. When you talk to your doctor please let them know that you were seen in the emergency department and have them acquire all of your records so that they can discuss the findings with you and formulate a treatment plan to fully care for your new and ongoing problems. Please call and set-up an appointment with your primary care provider to be re-assessed Please rest and stay hydrated Please keep site clean - wash with warm water and soap, gently remove with water, apply bacitracin/neosporin Keep steri-strips on for 3 days and gently remove, clean wound, and reapply the steri strips for another 4 days  Please closely monitor wound Please continue to take pain medications as prescribed and as needed - while on pain medications there is to be no drinking alcohol, driving, operating any heavy machinery - if there is extra please dispose in a proper manner. Please do not take any extra Tylenol for this can lead to Tylenol overdose and liver issues Please continue to monitor symptoms closely and if symptoms are to worsen or change (fever greater than 101, numbness, tingling, fall, reinjury, wound opens, discharge, swelling, redness to the site, warmth to the site, red streaks running up the arm, changes to site at the wound) please report back to the ED immediately   Suture Removal, Care After Refer to this sheet in the next few weeks. These instructions provide you with information on caring for yourself after your procedure. Your health care provider may also give you more specific instructions. Your treatment has been planned according to current medical practices, but problems sometimes occur. Call your health care provider if you have any problems or questions after your procedure. WHAT TO EXPECT AFTER THE PROCEDURE After your stitches (sutures) are removed, it is typical to have the following:  Some discomfort and swelling in the  wound area.  Slight redness in the area. HOME CARE INSTRUCTIONS   If you have skin adhesive strips over the wound area, do not take the strips off. They will fall off on their own in a few days. If the strips remain in place after 14 days, you may remove them.  Change any bandages (dressings) at least once a day or as directed by your health care provider. If the bandage sticks, soak it off with warm, soapy water.  Apply cream or ointment only as directed by your health care provider. If using cream or ointment, wash the area with soap and water 2 times a day to remove all the cream or ointment. Rinse off the soap and pat the area dry with a clean towel.  Keep the wound area dry and clean. If the bandage becomes wet or dirty, or if it develops a bad smell, change it as soon as possible.  Continue to protect the wound from injury.  Use sunscreen when out in the sun. New scars become sunburned easily. SEEK MEDICAL CARE IF:  You have increasing redness, swelling, or pain in the wound.  You see pus coming from the wound.  You have a fever.  You notice a bad smell coming from the wound or dressing.  Your wound breaks open (edges not staying together). Document Released: 09/10/2001 Document Revised: 10/06/2013 Document Reviewed: 07/28/2013 [facility] Patient Information 2014 [place], [place].  Sterile Tape Wound Care Some cuts and wounds can be closed using sterile tape, also called skin adhesive strips. Skin adhesive strips can be used for shallow (superficial) and simple  cuts, wounds, lacerations, and surgical incisions. These strips act in place of stitches to hold the edges of the wound together, allowing for faster healing. Unlike stitches, the adhesive strips do not require needles or anesthetic medicine for placement. The strips will wear off naturally as the wound is healing. It is important to take proper care of your wound at home while it heals.  HOME CARE INSTRUCTIONS  Try to  keep the area around your wound clean and dry. Do not allow the adhesive strips to get wet for the first 12 hours.   Do not use any soaps or ointments on the wound for the first 12 hours.   If a bandage (dressing) has been applied, follow your health care provider's instructions for how often to change the dressing. Keep the dressing dry if one has been applied.   Do not remove the adhesive strips. They will fall off on their own. If they do not, you may remove them gently after 10 days. You should gently wet the strips before removing them. For example, this can be done in the shower.  Do not scratch, pick, or rub the wound area.   Protect the wound from further injury until it is healed.   Protect the wound from sun and tanning bed exposure while it is healing and for several weeks after healing.   Only take over-the-counter or prescription medicines as directed by your health care provider.   Keep all follow-up appointments as directed by your health care provider.  SEEK MEDICAL CARE IF: Your adhesive strips become wet or soaked with blood before the wound has healed. The tape will need to be replaced.  SEEK IMMEDIATE MEDICAL CARE IF:  You have increasing pain in the wound.   You develop a rash after the strips are applied.  Your wound becomes red, swollen, hot, or tender.   You have a red streak that goes away from the wound.   You have pus coming from the wound.   You have increased bleeding from the wound.  You notice a bad smell coming from the wound.   Your wound breaks open. MAKE SURE YOU:  Understand these instructions.  Will watch your condition.  Will get help right away if you are not doing well or get worse. Document Released: 01/23/2005 Document Revised: 10/06/2013 Document Reviewed: 07/07/2013 [facility] Patient Information 2014 [place], [place].   Emergency Department Resource Guide 1) Find a Doctor and Pay Out of Pocket Although you won't  have to find out who is covered by your insurance plan, it is a good idea to ask around and get recommendations. You will then need to call the office and see if the doctor you have chosen will accept you as a new patient and what types of options they offer for patients who are self-pay. Some doctors offer discounts or will set up payment plans for their patients who do not have insurance, but you will need to ask so you aren't surprised when you get to your appointment.  2) Contact Your Local Health Department Not all health departments have doctors that can see patients for sick visits, but many do, so it is worth a call to see if yours does. If you don't know where your local health department is, you can check in your phone book. The CDC also has a tool to help you locate your state's health department, and many state websites also have listings of all of their local health departments.  3)  Find a Walk-in Clinic If your illness is not likely to be very severe or complicated, you may want to try a walk in clinic. These are popping up all over the country in pharmacies, drugstores, and shopping centers. They're usually staffed by nurse practitioners or physician assistants that have been trained to treat common illnesses and complaints. They're usually fairly quick and inexpensive. However, if you have serious medical issues or chronic medical problems, these are probably not your best option.  No Primary Care Doctor: - Call Health Connect at  [phone] - they can help you locate a primary care doctor that  accepts your insurance, provides certain services, etc. - Physician Referral Service- [phone]  Chronic Pain Problems: Organization         Address  Phone   Notes  [facility]  [phone] Patients need to be referred by their primary care doctor.   Medication Assistance: Organization         Address  Phone   Notes  [facility] Medication [facility]  [address] [place] [place], [place] [address] [phone] --Must be a resident of [facility] -- Must have NO insurance coverage whatsoever (no Medicaid/ Medicare, etc.) -- The pt. MUST have a primary care doctor that directs their care regularly and follows them in the community   [facility]  [phone]   [employer]  [phone]    Agencies that provide inexpensive medical care: Organization         Address  Phone   Notes  [facility]  [phone]   [facility]    [phone]   [facility] [address] [place], [place] [address] [phone]   [facility] [address] [place], [place] [phone]   Planned Parenthood    [phone]   [facility]    [phone]   [facility] and [facility]  [address], [place] Phone:  [phone], Fax:  [phone] Hours of Operation:  9 am - 6 pm, M-F.  Also accepts Medicaid/Medicare and self-pay.  [facility]  [address], [place] Phone: [phone], Fax: [phone]. Hours of Operation:  8:30 am - 5:30 pm, M-F.  Also accepts Medicaid and self-pay.  [facility] [place] [address], [place] Phone: [phone]   [facility] [address] Natasha Bence [place], [place] [phone], Ext. [phone] Mondays & Thursdays: 7-9 AM.  First 15 patients are seen on a first come, first serve basis.    Medicaid-accepting [facility] Providers:  Organization         Address  Phone   Notes  [facility] [address], [place] [phone] Also accepts self-pay patients.  [facility] [address] Laurell Josephs [place], [place]  [phone]   [facility] [address], [place] [phone]   [facility] [address] [place] [phone]    Renaye Rakers [address], [place]   [phone] Only accepts [place] patients after they have their name applied to their card.   Self-Pay (no insurance) in [facility]:  Organization         Address  Phone   Notes  Sickle Cell Patients, [facility] [address] [place] [phone]   [facility] [address] [place], [place] [phone]   [facility] [place]  [address], [place] [phone]   [facility]/Dr. Osei-Bonsu  [address] [place] or [address], [place] [phone] Phone number for both [place] and [place] locations is the same.  [facility] [address] [place] [phone]   [facility] [address] [place] or [address] [phone]   [facility] [address] [place] [phone], phone; [phone], fax Sees patients 1st and 3rd Saturday of every month.  Must not qualify for public or private insurance (i.e. Medicaid, Medicare, [place] Health Choice, Veterans' Benefits)  Household income should be no more than 200% of the poverty level The clinic cannot treat you if you are pregnant or think you are pregnant  Sexually transmitted diseases are not treated at the clinic.    Dental Care: Organization         Address  Phone  Notes  [facility] Department of [facility] [address] [place], [place] [phone] Accepts children up to age 92 who are enrolled in [place] or  [place] Health Choice; pregnant women with a Medicaid card; and children who have applied for Medicaid or [place] Health Choice, but were declined, whose parents can pay a reduced fee at time of service.  [facility] of [facility]  [address], [place] [phone] Accepts children  up to age 36 who are enrolled in [place] or [place] Health Choice; pregnant women with a Medicaid card; and children who have applied for Medicaid or [place] Health Choice, but were declined, whose parents can pay a reduced fee at time of service.  [facility]  [address] [place], [place] [phone] Patients are seen by appointment only. Walk-ins are not accepted. [facility] will see patients 71 years of age and older. Monday - Tuesday (8am-5pm) Most Wednesdays (8:30-5pm) $30 per visit, cash only  [facility]  [address], [facility] [phone] Patients are seen by appointment only. Walk-ins are not accepted. [facility] will see patients 24 years of age and older. One Wednesday Evening (Monthly: Volunteer Based).  $30 per visit, cash only  [employer]  [phone] for adults; Children under age 78, call Graduate Pediatric Dentistry at [phone]. Children aged 39-14, please call [phone] to request a pediatric application.  Dental services are provided in all areas of dental care including fillings, crowns and bridges, complete and partial dentures, implants, gum treatment, root canals, and extractions. Preventive care is also provided. Treatment is provided to both adults and children. Patients are selected via a lottery and there is often a waiting list.   [facility] [address] [place]  [phone] [email or web address]   [facility] [address] [place], [place] [phone], Ext. [phone] Second and Fourth Thursday of each month, opens at 6:30 AM; Clinic ends at 9 AM.  Patients are seen on a first-come first-served basis, and a limited number are seen during each clinic.   [facility]  [address] Ether Griffins [place], [place] [phone]   Eligibility Requirements You must have lived in [place], [place], or  [place] counties for at least the last  three months.   You cannot be eligible for state or federal sponsored [place]healthcare insurance, including CIG[employer]eterans Administration, [place]icaid, or [employer]icare.   You generally cannot be eligible for healthcare insurance through your employer.    How to apply: Eligibility screenings are held every Tuesday and Wednesday afternoon from 1:00 pm until 4:00 pm. You do not need an appointment for the interview!  [facility] [address], [place], [place] [phone]   [facility]  [phone]   [facility]  [phone]   [facility]  [phone]    Behavioral Health Resources in the Community: Intensive Outpatient Programs Organization         Address  Phone  Notes  [facility] [address], [place], [place] [phone]   [address] [facility] [address], [place], [place] [phone]   [facility] [address], [place], [place]  [phone]   [facility] [address],  [place], [place] [phone] or [phone]   Substance Abuse Resources Organization         Address  Phone  Notes  [facility]  [phone]   [facility]  [phone]   [facility]  [phone]   [facility]  [phone]   Residential & Outpatient Substance Abuse Program  [phone]   Psychological Services Organization         Address  Phone  Notes  [facility]  [phone]- [phone]   [facility]  [phone]   [facility] [address], [place] [phone] or [phone]    Mobile Crisis Teams Organization         Address  Phone  Notes  [facility], Mobile Crisis Care Unit  [phone]   [facility]  [address] [place], [place] [phone]   Doristine LocksSharon DeEsch [address] [place] [phone]     Self-Help/Support Groups Organization         Address  Phone             Notes  [facility] [place] - variety of support groups  336- [phone] Call for more information  Narcotics Anonymous (NA), [facility] [address], [place]  2 meetings at this location   Statisticianesidential Treatment Programs Organization         Address  Phone  Notes  [facility] [address],    [place]  [phone]   [facility]  [address], [place] [phone], [place], [place] [phone]   [facility] [address], [place] [phone] Admissions: 8am-3pm M-F  [facility] [address]    [place], [place] [phone]   [facility] [address], [place], [place] [phone]   [facility] [address]  [place], [place] [phone]   [facility] - Intensive Outpatient [address], [place], [place] [phone]   [facility] ([facility] [address]  [place], [place] [phone] or [phone]   [facility] ([facility]) [address] [place], [place] [phone] Accepts Medicaid  [facility] [address]  [place] [phone] Substance Abuse/Addiction Treatment   [facility] Resources Organization         Address  Phone  Notes  [facility]  [phone]   Angie Fava, PhD [address] Ervin Knack [place], [place]   [phone] or [phone]   [facility] Behavioral   [address] [place], [place] [phone]   [facility] [address], [place], [place] [phone] Insurance/Medicaid/sponsorship through [facility] [address]                                    [place], [place] [phone] Therapy/tele-psych/case  [facility] [address], [place] [phone]    Dr. Lolly Mustache  [phone]   Free  [facility] [place]  [employer] [facility] Dept. 1) [address] [place] 2) [address] [place] 3)  [address], [place] [phone]  [phone]   [facility] Child Abuse Hotline 561-090-4124 or [phone] (After Hours)

## 2014-03-18 NOTE — ED Provider Notes (Signed)
Medical screening examination/treatment/procedure(s) were performed by non-physician practitioner and as supervising physician I was immediately available for consultation/collaboration.   EKG Interpretation None        Lannie Heaps H Ireland Chagnon, MD 03/18/14 1550 

## 2014-03-18 NOTE — Progress Notes (Signed)
[facility] CL provided pt with a list of primary care resources to help patient establish primary care.  °

## 2014-03-18 NOTE — ED Provider Notes (Signed)
CSN: [id number]     Arrival date & time 03/18/14  1011 History   First MD Initiated Contact with Patient 03/18/14 1017     Chief Complaint  Patient presents with  . Suture / Staple Removal     (Consider location/radiation/quality/duration/timing/severity/associated sxs/prior Treatment) Patient is a 48 y.o. female presenting with suture removal. The history is provided by the patient. No language interpreter was used.  Suture / Staple Removal Pertinent negatives include no fever, numbness or weakness.  Amy Norris is a 48 y/o F with PMHx of bronchitis, obesity, GERD, DM, hypokalemia, depression, sleep apnea presenting to the ED for sutures to be removed from a laceration that occurred on 03/09/2014 - patient was slicing bread at work with a serrated knife when the knife slipped and she ended up cutting her right palmar aspect. Patient was seen and assessed in the ED on 03/09/2014 where 8 sutures were placed. Patient reported that the wound is healing well and that she started work yesterday. Patient reported that she has been applying neosporin to the site, has not been washing with warm water and soap. Patient reported that she experiences intermittent pain to the site, described as a sharp pain with mild radiation to the wrist. Stated that she has been using Percocets with relief. Denied fever, chills, swelling, red streaks, drainage, bleeding, sutures coming undone, numbness, tingling. PCP Dr. Yetta BarreJones  Past Medical History  Diagnosis Date  . Bronchitis   . Obesity   . GERD (gastroesophageal reflux disease)   . Diabetes mellitus     Type II  . Depression   . Sleep apnea, obstructive   . Herpes simplex type 1 infection     vaginal  . Menorrhagia   . Carpal tunnel syndrome, bilateral   . Renal calculi   . Hypokalemia     2/2 diarrhea  . Cholelithiasis   . Renal calculus   . Breast mass   . Anal fissure   . Polycystic ovarian disease   . Essential hypertension    Past Surgical  History  Procedure Laterality Date  . Tubal ligation  1991    1991  . Cholecystectomy  11/08   Family History  Problem Relation Age of Onset  . Hypertension Mother   . Diabetes Father    History  Substance Use Topics  . Smoking status: Former Smoker    Types: Cigarettes    Quit date: 12/30/1978  . Smokeless tobacco: Not on file  . Alcohol Use: 0.0 oz/week   OB History   Grav Para Term Preterm Abortions TAB SAB Ect Mult Living                 Review of Systems  Constitutional: Negative for fever.  Skin: Positive for wound. Negative for color change.  Neurological: Negative for weakness and numbness.  All other systems reviewed and are negative.      Allergies  Vicodin and Metformin and related  Home Medications   Current Outpatient Rx  Name  Route  Sig  Dispense  Refill  . acetaminophen (TYLENOL) 500 MG tablet   Oral   Take 2 tablets (1,000 mg total) by mouth every 8 (eight) hours as needed for pain.         Marland Kitchen. FLUoxetine (PROZAC) 40 MG capsule   Oral   Take 1 capsule (40 mg total) by mouth daily.   31 capsule   6   . glipiZIDE (GLUCOTROL) 10 MG tablet      TAKE  ONE TABLET BY MOUTH ONCE DAILY W/ A MEAL.   180 tablet   1   . EXPIRED: glucose blood test strip      Use as instructed   100 each   12     Test your blood sugar 2-4 times daily   . insulin NPH-regular (NOVOLIN 70/30 RELION) (70-30) 100 UNIT/ML injection   Subcutaneous   Inject 40 Units into the skin 2 (two) times daily with a meal.   10 mL   12   . lisinopril (PRINIVIL,ZESTRIL) 10 MG tablet      TAKE ONE TABLET BY MOUTH EVERY DAY   90 tablet   2   . metFORMIN (GLUCOPHAGE) 1000 MG tablet   Oral   Take 1 tablet (1,000 mg total) by mouth every evening.   60 tablet   6     Please disregard other Rx of 500mg  bid. She needs  ...   . oxyCODONE-acetaminophen (PERCOCET/ROXICET) 5-325 MG per tablet   Oral   Take 1-2 tablets by mouth every 6 (six) hours as needed for severe pain.    20 tablet   0   . pravastatin (PRAVACHOL) 40 MG tablet   Oral   Take 1 tablet (40 mg total) by mouth every morning.   90 tablet   6    BP 141/77  Pulse 70  Temp(Src) 98.3 F (36.8 C) (Oral)  Resp 18  SpO2 98%  LMP 02/26/2014 Physical Exam  Nursing note and vitals reviewed. Constitutional: She is oriented to person, place, and time. She appears well-developed and well-nourished. No distress.  HENT:  Head: Normocephalic and atraumatic.  Eyes: Conjunctivae and EOM are normal. Right eye exhibits no discharge. Left eye exhibits no discharge.  Neck: Normal range of motion. Neck supple.  Cardiovascular: Normal rate, regular rhythm and normal heart sounds.  Exam reveals no friction rub.   No murmur heard. Pulses:      Radial pulses are 2+ on the right side, and 2+ on the left side.  Pulmonary/Chest: Effort normal and breath sounds normal. No respiratory distress. She has no wheezes. She has no rales.  Musculoskeletal: Normal range of motion.  Full range of motion to the digits of the right hand without difficulty. Full flexion extension, abduction, adduction noted to the digits without difficulty. Full ROM to upper and lower extremities without difficulty noted, negative ataxia noted.   Neurological: She is alert and oriented to person, place, and time. No cranial nerve deficit. She exhibits normal muscle tone. Coordination normal.  Sensation intact with differentiation sharp and dull touch Strength intact to MCP, PIP, DIP joints of right hand  Skin: Skin is warm and dry. She is not diaphoretic.  Approximately 4 cm healed laceration noted to the hypothenar region of the right palm - 8 sutures, single interrupted using prolene noted. Negative swelling, erythema, inflammation, warmth, discharge, active bleeding, red streaks noted.    ED Course  SUTURE REMOVAL Date/Time: 03/18/2014 11:26 AM Performed by: Raymon Mutton Authorized by: Raymon Mutton Consent: Verbal consent  obtained. Risks and benefits: risks, benefits and alternatives were discussed Consent given by: patient Patient understanding: patient states understanding of the procedure being performed Patient consent: the patient's understanding of the procedure matches consent given Patient identity confirmed: verbally with patient and arm band Body area: upper extremity Location details: right hand Wound Appearance: clean Sutures Removed: 8 Post-removal: Steri-Strips applied, dressing applied and antibiotic ointment applied Facility: sutures placed in this facility Patient tolerance: Patient tolerated the procedure  well with no immediate complications. Comments: Wound healing well. Negative signs of cellulitic infection.    (including critical care time) Labs Review Labs Reviewed - No data to display Imaging Review No results found.   EKG Interpretation None      MDM   Final diagnoses:  Visit for suture removal    Filed Vitals:   03/18/14 1032  BP: 141/77  Pulse: 70  Temp: 98.3 F (36.8 C)  TempSrc: Oral  Resp: 18  SpO2: 98%   Patient presenting to the ED with suture removal from laceration that occurred on 03/09/2014 while at work. Patient was administered tetanus on 03/09/2014. 8 sutures are placed in the right palmar aspect. Alert and oriented. GCS 15. Heart rate and rhythm normal. Lungs clear to auscultation to upper and lower lobes bilaterally. Radial pulses 2+. Cap refill less than 3 seconds. Approximately 4 cm laceration localized to the hypo-thenar region of the right palmar aspects with heat Prolene sutures, single interrupted identified. Negative swelling, erythema, inflammation, red streaks, drainage, discharge noted to the site. Wound healing well. Sutures removed-8 Prolene single interrupted sutures. Patient tolerated procedure well. Wound thoroughly cleaned. Bacitracin applied. Steri-Strips applied. Patient placed in bandage. Patient stable, afebrile. Negative signs of  cellulitic infection noted to the laceration site. Wound healing well. Sutures removed. Steri-Strips applied. Discharged patient. Discussed with patient proper wound hygiene. Discussed with patient Steri-Stripped care. Discussed with patient to keep the wound covered at all times especially when at work. Discussed with patient signs to watch out for regarding possible infection. Referred to primary care provider. Discussed with patient to closely monitor symptoms and if symptoms are to worsen or change to report back to the ED - strict return instructions given.  Patient agreed to plan of care, understood, all questions answered.   Raymon Mutton, PA-C 03/18/14 1132

## 2014-05-13 ENCOUNTER — Other Ambulatory Visit: Payer: Self-pay | Admitting: *Deleted

## 2014-05-13 DIAGNOSIS — I10 Essential (primary) hypertension: Secondary | ICD-10-CM

## 2014-05-13 DIAGNOSIS — E119 Type 2 diabetes mellitus without complications: Secondary | ICD-10-CM

## 2014-05-17 MED ORDER — LISINOPRIL 10 MG PO TABS: ORAL_TABLET | ORAL | Status: DC

## 2014-06-17 ENCOUNTER — Other Ambulatory Visit: Payer: Self-pay | Admitting: Internal Medicine

## 2014-06-28 ENCOUNTER — Other Ambulatory Visit: Payer: Self-pay | Admitting: *Deleted

## 2014-06-28 DIAGNOSIS — F3289 Other specified depressive episodes: Secondary | ICD-10-CM

## 2014-06-28 DIAGNOSIS — F329 Major depressive disorder, single episode, unspecified: Secondary | ICD-10-CM

## 2014-06-30 MED ORDER — FLUOXETINE HCL 40 MG PO CAPS: 40.0000 mg | ORAL_CAPSULE | Freq: Every day | ORAL | Status: DC

## 2014-07-11 ENCOUNTER — Encounter: Payer: Self-pay | Admitting: Internal Medicine

## 2014-07-11 ENCOUNTER — Ambulatory Visit (INDEPENDENT_AMBULATORY_CARE_PROVIDER_SITE_OTHER): Payer: Self-pay | Admitting: Internal Medicine

## 2014-07-11 VITALS — BP 129/85 | HR 92 | Temp 98.8°F | Ht 66.0 in | Wt 243.5 lb

## 2014-07-11 DIAGNOSIS — R5383 Other fatigue: Secondary | ICD-10-CM

## 2014-07-11 DIAGNOSIS — R1310 Dysphagia, unspecified: Secondary | ICD-10-CM

## 2014-07-11 DIAGNOSIS — F3289 Other specified depressive episodes: Secondary | ICD-10-CM

## 2014-07-11 DIAGNOSIS — E119 Type 2 diabetes mellitus without complications: Secondary | ICD-10-CM

## 2014-07-11 DIAGNOSIS — F329 Major depressive disorder, single episode, unspecified: Secondary | ICD-10-CM

## 2014-07-11 DIAGNOSIS — R5381 Other malaise: Secondary | ICD-10-CM

## 2014-07-11 DIAGNOSIS — M5441 Lumbago with sciatica, right side: Secondary | ICD-10-CM

## 2014-07-11 DIAGNOSIS — I10 Essential (primary) hypertension: Secondary | ICD-10-CM

## 2014-07-11 DIAGNOSIS — M549 Dorsalgia, unspecified: Secondary | ICD-10-CM

## 2014-07-11 DIAGNOSIS — E7800 Pure hypercholesterolemia, unspecified: Secondary | ICD-10-CM

## 2014-07-11 LAB — GLUCOSE, CAPILLARY: GLUCOSE-CAPILLARY: 249 mg/dL — AB (ref 70–99)

## 2014-07-11 LAB — POCT GLYCOSYLATED HEMOGLOBIN (HGB A1C): Hemoglobin A1C: 9.1

## 2014-07-11 MED ORDER — INSULIN NPH ISOPHANE & REGULAR (70-30) 100 UNIT/ML ~~LOC~~ SUSP: 40.0000 [IU] | Freq: Two times a day (BID) | SUBCUTANEOUS | Status: DC

## 2014-07-11 MED ORDER — METFORMIN HCL 1000 MG PO TABS: 1000.0000 mg | ORAL_TABLET | Freq: Every evening | ORAL | Status: DC

## 2014-07-11 MED ORDER — TRAMADOL HCL 50 MG PO TABS: 50.0000 mg | ORAL_TABLET | Freq: Two times a day (BID) | ORAL | Status: DC | PRN

## 2014-07-11 MED ORDER — LISINOPRIL 10 MG PO TABS: ORAL_TABLET | ORAL | Status: DC

## 2014-07-11 MED ORDER — GLIPIZIDE 10 MG PO TABS: ORAL_TABLET | ORAL | Status: DC

## 2014-07-11 MED ORDER — FLUOXETINE HCL 40 MG PO CAPS: 40.0000 mg | ORAL_CAPSULE | Freq: Every day | ORAL | Status: DC

## 2014-07-11 NOTE — Patient Instructions (Addendum)
General Instructions:  Please follow up in 2-4 weeks (bring meter and medications) Take Tramadol twice a day as needed for pain alternate with Tylenol  I will refer you to GI for dysphagia  I will check labs today and let you know results  Get MRI of your back  Take care   Treatment Goals:  Goals (1 Years of Data) as of 07/11/14         As of Today 03/18/14 03/09/14 02/04/14 11/29/13     Blood Pressure    . Blood Pressure < 130/80  129/85 141/77 145/83 130/86 117/77     Result Component    . HEMOGLOBIN A1C < 7.0  9.1        . LDL CALC < 100            Progress Toward Treatment Goals:  Treatment Goal 07/11/2014  Hemoglobin A1C improved  Blood pressure at goal    Self Care Goals & Plans:  Self Care Goal 07/11/2014  Manage my medications take my medicines as prescribed; bring my medications to every visit; refill my medications on time  Monitor my health keep track of my blood glucose; bring my glucose meter and log to each visit; keep track of my blood pressure; bring my blood pressure log to each visit; keep track of my weight; check my feet daily  Eat healthy foods drink diet soda or water instead of juice or soda; eat more vegetables; eat foods that are low in salt; eat baked foods instead of fried foods; eat fruit for snacks and desserts; eat smaller portions  Be physically active -  Meeting treatment goals maintain the current self-care plan    Home Blood Glucose Monitoring 07/11/2014  Check my blood sugar 3 times a day  When to check my blood sugar before meals     Care Management & Community Referrals:  Referral 07/11/2014  Referrals made for care management support -  Referrals made to community resources none    Back Pain, Adult Low back pain is very common. About 1 in 5 people have back pain.The cause of low back pain is rarely dangerous. The pain often gets better over time.About half of people with a sudden onset of back pain feel better in just 2 weeks. About 8  in 10 people feel better by 6 weeks.  CAUSES Some common causes of back pain include:  Strain of the muscles or ligaments supporting the spine.  Wear and tear (degeneration) of the spinal discs.  Arthritis.  Direct injury to the back. DIAGNOSIS Most of the time, the direct cause of low back pain is not known.However, back pain can be treated effectively even when the exact cause of the pain is unknown.Answering your caregiver's questions about your overall health and symptoms is one of the most accurate ways to make sure the cause of your pain is not dangerous. If your caregiver needs more information, he or she may order lab work or imaging tests (X-rays or MRIs).However, even if imaging tests show changes in your back, this usually does not require surgery. HOME CARE INSTRUCTIONS For many people, back pain returns.Since low back pain is rarely dangerous, it is often a condition that people can learn to [facility] their own.   Remain active. It is stressful on the back to sit or stand in one place. Do not sit, drive, or stand in one place for more than 30 minutes at a time. Take short walks on level surfaces as soon  as pain allows.Try to increase the length of time you walk each day.  Do not stay in bed.Resting more than 1 or 2 days can delay your recovery.  Do not avoid exercise or work.Your body is made to move.It is not dangerous to be active, even though your back may hurt.Your back will likely heal faster if you return to being active before your pain is gone.  Pay attention to your body when you bend and lift. Many people have less discomfortwhen lifting if they bend their knees, keep the load close to their bodies,and avoid twisting. Often, the most comfortable positions are those that put less stress on your recovering back.  Find a comfortable position to sleep. Use a firm mattress and lie on your side with your knees slightly bent. If you lie on your back, put a pillow  under your knees.  Only take over-the-counter or prescription medicines as directed by your caregiver. Over-the-counter medicines to reduce pain and inflammation are often the most helpful.Your caregiver may prescribe muscle relaxant drugs.These medicines help dull your pain so you can more quickly return to your normal activities and healthy exercise.  Put ice on the injured area.  Put ice in a plastic bag.  Place a towel between your skin and the bag.  Leave the ice on for 15-20 minutes, 03-04 times a day for the first 2 to 3 days. After that, ice and heat may be alternated to reduce pain and spasms.  Ask your caregiver about trying back exercises and gentle massage. This may be of some benefit.  Avoid feeling anxious or stressed.Stress increases muscle tension and can worsen back pain.It is important to recognize when you are anxious or stressed and learn ways to manage it.Exercise is a great option. SEEK MEDICAL CARE IF:  You have pain that is not relieved with rest or medicine.  You have pain that does not improve in 1 week.  You have new symptoms.  You are generally not feeling well. SEEK IMMEDIATE MEDICAL CARE IF:   You have pain that radiates from your back into your legs.  You develop new bowel or bladder control problems.  You have unusual weakness or numbness in your arms or legs.  You develop nausea or vomiting.  You develop abdominal pain.  You feel faint. Document Released: 12/16/2005 Document Revised: 06/16/2012 Document Reviewed: 05/06/2011 [facility] Patient Information 2015 [place], [place]. This information is not intended to replace advice given to you by your health care provider. Make sure you discuss any questions you have with your health care provider.  Dysphagia Swallowing problems (dysphagia) occur when solids and liquids seem to stick in your throat on the way down to your stomach, or the food takes longer to get to the stomach. Other symptoms  include regurgitating food, noises coming from the throat, chest discomfort with swallowing, and a feeling of fullness or the feeling of something being stuck in your throat when swallowing. When blockage in your throat is complete it may be associated with drooling. CAUSES  Problems with swallowing may occur because of problems with the muscles. The food cannot be propelled in the usual manner into your stomach. You may have ulcers, scar tissue, or inflammation in the tube down which food travels from your mouth to your stomach (esophagus), which blocks food from passing normally into the stomach. Causes of inflammation include:  Acid reflux from your stomach into your esophagus.  Infection.  Radiation treatment for cancer.  Medicines taken without enough  fluids to wash them down into your stomach. You may have nerve problems that prevent signals from being sent to the muscles of your esophagus to contract and move your food down to your stomach. Globus pharyngeus is a relatively common problem in which there is a sense of an obstruction or difficulty in swallowing, without any physical abnormalities of the swallowing passages being found. This problem usually improves over time with reassurance and testing to rule out other causes. DIAGNOSIS Dysphagia can be diagnosed and its cause can be determined by tests in which you swallow a white substance that helps illuminate the inside of your throat (contrast medium) while X-rays are taken. Sometimes a flexible telescope that is inserted down your throat (endoscopy) to look at your esophagus and stomach is used. TREATMENT   If the dysphagia is caused by acid reflux or infection, medicines may be used.  If the dysphagia is caused by problems with your swallowing muscles, swallowing therapy may be used to help you strengthen your swallowing muscles.  If the dysphagia is caused by a blockage or mass, procedures to remove the blockage may be done. HOME  CARE INSTRUCTIONS  Try to eat soft food that is easier to swallow and check your weight on a daily basis to be sure that it is not decreasing.  Be sure to drink liquids when sitting upright (not lying down). SEEK MEDICAL CARE IF:  You are losing weight because you are unable to swallow.  You are coughing when you drink liquids (aspiration).  You are coughing up partially digested food. SEEK IMMEDIATE MEDICAL CARE IF:  You are unable to swallow your own saliva .  You are having shortness of breath or a fever, or both.  You have a hoarse voice along with difficulty swallowing. MAKE SURE YOU:  Understand these instructions.  Will watch your condition.  Will get help right away if you are not doing well or get worse. Document Released: 12/13/2000 Document Revised: 08/18/2013 Document Reviewed: 06/04/2013 [facility] Patient Information 2015 [place], [place]. This information is not intended to replace advice given to you by your health care provider. Make sure you discuss any questions you have with your health care provider.  Tramadol tablets What is this medicine? TRAMADOL (TRA ma dole) is a pain reliever. It is used to treat moderate to severe pain in adults. This medicine may be used for other purposes; ask your health care provider or pharmacist if you have questions. COMMON BRAND NAME(S): Ultram What should I tell my health care provider before I take this medicine? They need to know if you have any of these conditions: -brain tumor -depression -drug abuse or addiction -head injury -if you frequently drink alcohol containing drinks -kidney disease or trouble passing urine -liver disease -lung disease, asthma, or breathing problems -seizures or epilepsy -suicidal thoughts, plans, or attempt; a previous suicide attempt by you or a family member -an unusual or allergic reaction to tramadol, codeine, other medicines, foods, dyes, or preservatives -pregnant or trying to get  pregnant -breast-feeding How should I use this medicine? Take this medicine by mouth with a full glass of water. Follow the directions on the prescription label. If the medicine upsets your stomach, take it with food or milk. Do not take more medicine than you are told to take. Talk to your pediatrician regarding the use of this medicine in children. Special care may be needed. Overdosage: If you think you have taken too much of this medicine contact a poison control  center or emergency room at once. NOTE: This medicine is only for you. Do not share this medicine with others. What if I miss a dose? If you miss a dose, take it as soon as you can. If it is almost time for your next dose, take only that dose. Do not take double or extra doses. What may interact with this medicine? Do not take this medicine with any of the following medications: -MAOIs like Carbex, Eldepryl, Marplan, Nardil, and Parnate This medicine may also interact with the following medications: -alcohol or medicines that contain alcohol -antihistamines -benzodiazepines -bupropion -carbamazepine or oxcarbazepine -clozapine -cyclobenzaprine -digoxin -furazolidone -linezolid -medicines for depression, anxiety, or psychotic disturbances -medicines for migraine headache like almotriptan, eletriptan, frovatriptan, naratriptan, rizatriptan, sumatriptan, zolmitriptan -medicines for pain like pentazocine, buprenorphine, butorphanol, meperidine, nalbuphine, and propoxyphene -medicines for sleep -muscle relaxants -naltrexone -phenobarbital -phenothiazines like perphenazine, thioridazine, chlorpromazine, mesoridazine, fluphenazine, prochlorperazine, promazine, and trifluoperazine -procarbazine -warfarin This list may not describe all possible interactions. Give your health care provider a list of all the medicines, herbs, non-prescription drugs, or dietary supplements you use. Also tell them if you smoke, drink alcohol, or use  illegal drugs. Some items may interact with your medicine. What should I watch for while using this medicine? Tell your doctor or health care professional if your pain does not go away, if it gets worse, or if you have new or a different type of pain. You may develop tolerance to the medicine. Tolerance means that you will need a higher dose of the medicine for pain relief. Tolerance is normal and is expected if you take this medicine for a long time. Do not suddenly stop taking your medicine because you may develop a severe reaction. Your body becomes used to the medicine. This does NOT mean you are addicted. Addiction is a behavior related to getting and using a drug for a non-medical reason. If you have pain, you have a medical reason to take pain medicine. Your doctor will tell you how much medicine to take. If your doctor wants you to stop the medicine, the dose will be slowly lowered over time to avoid any side effects. You may get drowsy or dizzy. Do not drive, use machinery, or do anything that needs mental alertness until you know how this medicine affects you. Do not stand or sit up quickly, especially if you are an older patient. This reduces the risk of dizzy or fainting spells. Alcohol can increase or decrease the effects of this medicine. Avoid alcoholic drinks. You may have constipation. Try to have a bowel movement at least every 2 to 3 days. If you do not have a bowel movement for 3 days, call your doctor or health care professional. Your mouth may get dry. Chewing sugarless gum or sucking hard candy, and drinking plenty of water may help. Contact your doctor if the problem does not go away or is severe. What side effects may I notice from receiving this medicine? Side effects that you should report to your doctor or health care professional as soon as possible: -allergic reactions like skin rash, itching or hives, swelling of the face, lips, or tongue -breathing difficulties,  wheezing -confusion -itching -light headedness or fainting spells -redness, blistering, peeling or loosening of the skin, including inside the mouth -seizures Side effects that usually do not require medical attention (report to your doctor or health care professional if they continue or are bothersome): -constipation -dizziness -drowsiness -headache -nausea, vomiting This list may not describe all possible  side effects. Call your doctor for medical advice about side effects. You may report side effects to FDA at 1-800-FDA-1088. Where should I keep my medicine? Keep out of the reach of children. Store at room temperature between 15 and 30 degrees C (59 and 86 degrees F). Keep container tightly closed. Throw away any unused medicine after the expiration date. NOTE: This sheet is a summary. It may not cover all possible information. If you have questions about this medicine, talk to your doctor, pharmacist, or health care provider.  2015, [employer]. (2010-08-29 11:55:44)

## 2014-07-12 ENCOUNTER — Encounter: Payer: Self-pay | Admitting: Internal Medicine

## 2014-07-12 DIAGNOSIS — R5383 Other fatigue: Secondary | ICD-10-CM | POA: Insufficient documentation

## 2014-07-12 DIAGNOSIS — R1310 Dysphagia, unspecified: Secondary | ICD-10-CM | POA: Insufficient documentation

## 2014-07-12 LAB — BASIC METABOLIC PANEL WITH GFR
BUN: 9 mg/dL (ref 6–23)
CO2: 28 mEq/L (ref 19–32)
Calcium: 9.3 mg/dL (ref 8.4–10.5)
Chloride: 100 mEq/L (ref 96–112)
Creat: 0.59 mg/dL (ref 0.50–1.10)
GFR, Est Non African American: >89 mL/min
Glucose, Bld: 274 mg/dL — ABNORMAL HIGH (ref 70–99)
Potassium: 4.2 mEq/L (ref 3.5–5.3)
SODIUM: 136 meq/L (ref 135–145)

## 2014-07-12 LAB — CBC WITH DIFFERENTIAL/PLATELET
BASOS ABS: 0 10*3/uL (ref 0.0–0.1)
Basophils Relative: 0 % (ref 0–1)
EOS PCT: 1 % (ref 0–5)
Eosinophils Absolute: 0.1 10*3/uL (ref 0.0–0.7)
HCT: 43.5 % (ref 36.0–46.0)
Hemoglobin: 14.7 g/dL (ref 12.0–15.0)
LYMPHS PCT: 31 % (ref 12–46)
Lymphs Abs: 2.8 10*3/uL (ref 0.7–4.0)
MCH: 29.3 pg (ref 26.0–34.0)
MCHC: 33.8 g/dL (ref 30.0–36.0)
MCV: 86.8 fL (ref 78.0–100.0)
Monocytes Absolute: 0.6 10*3/uL (ref 0.1–1.0)
Monocytes Relative: 7 % (ref 3–12)
NEUTROS ABS: 5.6 10*3/uL (ref 1.7–7.7)
NEUTROS PCT: 61 % (ref 43–77)
PLATELETS: 288 10*3/uL (ref 150–400)
RBC: 5.01 MIL/uL (ref 3.87–5.11)
RDW: 13.3 % (ref 11.5–15.5)
WBC: 9.1 10*3/uL (ref 4.0–10.5)

## 2014-07-12 LAB — LIPID PANEL
CHOLESTEROL: 194 mg/dL (ref 0–200)
HDL: 59 mg/dL (ref >39)
LDL Cholesterol: 95 mg/dL (ref 0–99)
Total CHOL/HDL Ratio: 3.3 Ratio
Triglycerides: 201 mg/dL — ABNORMAL HIGH (ref <150)
VLDL: 40 mg/dL (ref 0–40)

## 2014-07-12 LAB — TSH: TSH: 0.908 u[IU]/mL (ref 0.350–4.500)

## 2014-07-12 NOTE — Assessment & Plan Note (Signed)
Due for eye exam, pap smear w/o any insurance

## 2014-07-12 NOTE — Assessment & Plan Note (Signed)
Lipid Panel     Component Value Date/Time   CHOL 194 07/11/2014 1510   TRIG 201* 07/11/2014 1510   HDL 59 07/11/2014 1510   CHOLHDL 3.3 07/11/2014 1510   VLDL 40 07/11/2014 1510   LDLCALC 95 07/11/2014 1510   Lipid panel today  Rx refill of statin

## 2014-07-12 NOTE — Assessment & Plan Note (Signed)
Rx refill of Prozac 40 mg qd

## 2014-07-12 NOTE — Assessment & Plan Note (Signed)
Etiology may be 2/2 stricture, achalasia, r/o malignancy. No masses felt anterior neck  Will refer to GI for further w/u

## 2014-07-12 NOTE — Assessment & Plan Note (Signed)
Will check CBC, tsh today Pt concerned for anterior neck mass none visible on exam today if reappears consider US neck

## 2014-07-12 NOTE — Progress Notes (Signed)
Subjective:    Patient ID: Amy Norris, female    DOB: Dec 03, 1966, 48 y.o.   MRN: [id number]  HPI Comments: 48 y.o PMH DM 2, HTN (129/85), depression, GERD, OSA,   She presents for: 1.  She c/o right lower back pain new for the last 2 months.  Pain is 6/10 feels like sharp at times "pins" with associated numbness and tingling and pain to the knee/thigh.  At times she feels like her knee wants to lock up and give out.  She takes Tylenol to ease the pain with relief of pain to 3/10.  Pain is worse with squatting, laying on her right side, standing up for long periods of time.  At times pain wakes pt up at night.  Ibuprofen does not help. 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   2. She reports difficulty with eating and drinking due to choking on solids and liquids.  She has to stop eating to clear her throat.   3. Neck mass anterior neck-sister in law notices intermittently front of neck swelling.   4. DM 2-HA1C 12.0>9.1 with cbg 249.  She did not bring in meter but reports intermittent hypoglycemic episodes where sx's are "wierd" include shaking and dizziness.   5. She needs a Rx refill of all her medications (She reports intermittent compliance with Glipizide and sometimes 70/30).  She did not bring in meter today.     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 Negative for fever.       Cold sweats   HENT: Positive for trouble swallowing.   Gastrointestinal: Negative for nausea, vomiting and abdominal pain.  Genitourinary:       Denies bowel or bladder incontinence Regular menstrual cycle but heavy bleeding.  At times blood clots   Pt able to feel herself wiping with bowel movement or urination   Musculoskeletal: Positive for back pain.       Objective:   Physical Exam  Nursing note and vitals reviewed. Constitutional: She is  oriented to person, place, and time. Vital signs are normal. She appears well-developed and well-nourished. She is cooperative. No distress.  HENT:  Head: Normocephalic and atraumatic.  Mouth/Throat: Oropharynx is clear and moist and mucous membranes are normal. Abnormal dentition. No oropharyngeal exudate.  Eyes: Conjunctivae are normal. Pupils are equal, round, and reactive to light. Right eye exhibits no discharge. Left eye exhibits no discharge. No scleral icterus.  Neck:  No mass obvious or masses felt in thyroid   Cardiovascular: Normal rate, regular rhythm, S1 normal, S2 normal and normal heart sounds.   No murmur heard. No lower ext edema   Pulmonary/Chest: Effort normal and breath sounds normal. No respiratory distress. She has no wheezes.  Abdominal: Soft. Bowel sounds are normal. There is no tenderness.  Musculoskeletal:       Back:  Neurological: She is alert and oriented to person, place, and time. She has normal strength. A sensory deficit is present. Gait normal.  CN 2-12 grossly intact, RLE sensory decreased   Skin: Skin is warm and dry. No rash noted. She is not diaphoretic.  Psychiatric: She has a normal mood and affect. Her speech is normal and behavior is normal. Judgment and thought content normal. Cognition and memory are normal.          Assessment & Plan:  F/u in  2-4 weeks bring meter and medications

## 2014-07-12 NOTE — Assessment & Plan Note (Addendum)
Lab Results  Component Value Date   HGBA1C 9.1 07/11/2014   HGBA1C 12.0 09/10/2013   HGBA1C 9.5 02/16/2013     Assessment: Diabetes control: fair control Progress toward A1C goal:  improved Comments: improved but pt still N/C with medications intermittently   Plan: Medications:  continue current medications (Glipizide 10 mg qd, 70/30 40 units bid, Metformin 100 mg qd); ask that pt bring in meter and meds at next visit; Rx refill of meds today  Home glucose monitoring: Frequency: 3 times a day Timing: before meals Instruction/counseling given: reminded to bring blood glucose meter & log to each visit and reminded to bring medications to each visit Educational resources provided: brochure Self management tools provided: copy of home glucose meter download;home glucose logbook Other plans: due for eye exam, foot exam, urine microal/Creatinine at f/u. F/u in 2-4 weeks to review meter and look for hypoglycemic episodes

## 2014-07-12 NOTE — Assessment & Plan Note (Signed)
Likely related to radiculopathy vs degenerative changes noted 02/2013 Xray Will order MRI lumbar Try Ultram 50 mg bid prn alternate with Tylenol

## 2014-07-12 NOTE — Progress Notes (Signed)
INTERNAL MEDICINE TEACHING ATTENDING ADDENDUM - Amy Weems, MD: I reviewed and discussed at the time of visit with the resident Dr. McLean, the patient's medical history, physical examination, diagnosis and results of pertinent tests and treatment and I agree with the patient's care as documented.  

## 2014-07-12 NOTE — Assessment & Plan Note (Signed)
BP Readings from Last 3 Encounters:  07/11/14 129/85  03/18/14 141/77  03/09/14 145/83    Lab Results  Component Value Date   NA 136 07/11/2014   K 4.2 07/11/2014   CREATININE 0.59 07/11/2014    Assessment: Blood pressure control: controlled Progress toward BP goal:  at goal Comments: none  Plan: Medications:  continue current medications-Rx refill of Lisinopril 10 mg qd  Educational resources provided: brochure Other plans: BMET today

## 2014-07-18 ENCOUNTER — Encounter: Payer: Self-pay | Admitting: Internal Medicine

## 2014-08-08 ENCOUNTER — Ambulatory Visit: Payer: Self-pay | Admitting: Internal Medicine

## 2014-09-19 ENCOUNTER — Ambulatory Visit: Payer: Self-pay | Admitting: Internal Medicine

## 2014-09-19 NOTE — Addendum Note (Signed)
Addended by: Neomia Dear on: 09/19/2014 09:28 PM   Modules accepted: Orders

## 2014-09-20 ENCOUNTER — Encounter: Payer: Self-pay | Admitting: *Deleted

## 2014-10-04 ENCOUNTER — Ambulatory Visit (INDEPENDENT_AMBULATORY_CARE_PROVIDER_SITE_OTHER): Payer: Self-pay | Admitting: Internal Medicine

## 2014-10-04 ENCOUNTER — Encounter: Payer: Self-pay | Admitting: Internal Medicine

## 2014-10-04 ENCOUNTER — Ambulatory Visit (HOSPITAL_COMMUNITY)
Admission: RE | Admit: 2014-10-04 | Discharge: 2014-10-04 | Disposition: A | Discharge status: Home / Self Care | Payer: Self-pay | Source: Ambulatory Visit | Attending: Internal Medicine | Admitting: Internal Medicine

## 2014-10-04 VITALS — BP 133/70 | HR 83 | Temp 98.6°F | Ht 68.0 in | Wt 214.8 lb

## 2014-10-04 DIAGNOSIS — Z0000 Encounter for general adult medical examination without abnormal findings: Secondary | ICD-10-CM

## 2014-10-04 DIAGNOSIS — E1165 Type 2 diabetes mellitus with hyperglycemia: Secondary | ICD-10-CM

## 2014-10-04 DIAGNOSIS — IMO0002 Reserved for concepts with insufficient information to code with codable children: Secondary | ICD-10-CM

## 2014-10-04 DIAGNOSIS — M25521 Pain in right elbow: Secondary | ICD-10-CM

## 2014-10-04 DIAGNOSIS — F3340 Major depressive disorder, recurrent, in remission, unspecified: Secondary | ICD-10-CM

## 2014-10-04 DIAGNOSIS — E7800 Pure hypercholesterolemia, unspecified: Secondary | ICD-10-CM

## 2014-10-04 DIAGNOSIS — Z23 Encounter for immunization: Secondary | ICD-10-CM

## 2014-10-04 DIAGNOSIS — I10 Essential (primary) hypertension: Secondary | ICD-10-CM

## 2014-10-04 LAB — GLUCOSE, CAPILLARY: Glucose-Capillary: 293 mg/dL — ABNORMAL HIGH (ref 70–99)

## 2014-10-04 LAB — POCT GLYCOSYLATED HEMOGLOBIN (HGB A1C): HEMOGLOBIN A1C: 9.4

## 2014-10-04 MED ORDER — METFORMIN HCL 1000 MG PO TABS: 1000.0000 mg | ORAL_TABLET | Freq: Every evening | ORAL | Status: DC

## 2014-10-04 MED ORDER — INSULIN NPH ISOPHANE & REGULAR (70-30) 100 UNIT/ML ~~LOC~~ SUSP: 40.0000 [IU] | Freq: Two times a day (BID) | SUBCUTANEOUS | Status: DC

## 2014-10-04 MED ORDER — PRAVASTATIN SODIUM 40 MG PO TABS: 40.0000 mg | ORAL_TABLET | Freq: Every morning | ORAL | Status: DC

## 2014-10-04 MED ORDER — METFORMIN HCL ER 500 MG PO TB24
1000.0000 mg | ORAL_TABLET | Freq: Every day | ORAL | Status: DC
Start: 2014-10-04 — End: 2015-01-05

## 2014-10-04 MED ORDER — GLIPIZIDE 10 MG PO TABS: ORAL_TABLET | ORAL | Status: DC

## 2014-10-04 MED ORDER — FLUOXETINE HCL 40 MG PO CAPS: 40.0000 mg | ORAL_CAPSULE | Freq: Every day | ORAL | Status: DC

## 2014-10-04 MED ORDER — LISINOPRIL 10 MG PO TABS: ORAL_TABLET | ORAL | Status: DC

## 2014-10-04 NOTE — Assessment & Plan Note (Signed)
Flu shot given today

## 2014-10-04 NOTE — Assessment & Plan Note (Signed)
Lab Results  Component Value Date   HGBA1C 9.4 10/04/2014   HGBA1C 9.1 07/11/2014   HGBA1C 12.0 09/10/2013     Assessment: Diabetes control:  poor control Progress toward A1C goal:   deteriorated Comments: Patient takes Glipizide 10 mg qAM, Metformin 1000 mg qd, and 70/30, 40 units bid. She is quite non-compliant w/ her insulin and states that she does not remember to refill it and then goes sometimes weeks at a time without using it. HbA1c 9.4 today. Emphasized importance of compliance w/ insulin. She understands and will try to take it every dose as directed.   Plan: Medications:  continue current medications; Will change Metformin 1000 mg qd to Metformin XR 1000 mg daily; Had issues tolerating higher dose of regular Metformin previously. Will see if she tolerates this does and can potentially increase it in the future.  Home glucose monitoring: Frequency:  None Timing:  N/A Instruction/counseling given: reminded to bring blood glucose meter & log to each visit and discussed diet Educational resources provided: brochure Self management tools provided:   Other plans: Check Microalbumin/Cr ratio today.

## 2014-10-04 NOTE — Assessment & Plan Note (Signed)
BP Readings from Last 3 Encounters:  10/04/14 133/70  07/11/14 129/85  03/18/14 141/77    Lab Results  Component Value Date   NA 136 07/11/2014   K 4.2 07/11/2014   CREATININE 0.59 07/11/2014    Assessment: Comments: Well controlled on Lisinopril.   Plan: Medications:  continue current medications Educational resources provided: brochure Self management tools provided:   Other plans: None

## 2014-10-04 NOTE — Progress Notes (Signed)
Subjective:   Patient ID: Amy Norris female   DOB: 04/01/1966 48 y.o.   MRN: [id number]  HPI: Amy Norris is a 48 y.o. female w/ PMHx of HTN, OSA, DM type II (last HbA1c 9.1 on 07/11/14), GERD, depression, and chronic back pain, presents to the clinic today for a follow-up visit for her DM. She also sates that she is having right elbow pain. She claims she had an injury to her elbow back in 1998, which healed appropriately, but recently she started a new job and feels that she strained something lifting a heavy crate about 2-3 weeks ago. She claims the pain is more significant w/ supination and does not cause any numbness or tingling in her hand. She says the pain is stays located w/in the lateral groove of her elbow. She has no significant swelling or redness over the joint. She has taken some tylenol bus says this has not helped.  She has been somewhat non-compliant w/ her insulin recently as she states she forgets to get a refill. She denies any recent symptoms of low blood sugar, but does admit to recent blurry vision (infrequent), polyuria, and increased thirst recently.   Past Medical History  Diagnosis Date  . Bronchitis   . Obesity   . GERD (gastroesophageal reflux disease)   . Diabetes mellitus     Type II  . Depression   . Sleep apnea, obstructive   . Herpes simplex type 1 infection     vaginal  . Menorrhagia   . Carpal tunnel syndrome, bilateral   . Renal calculi   . Hypokalemia     2/2 diarrhea  . Cholelithiasis   . Renal calculus   . Breast mass   . Anal fissure   . Polycystic ovarian disease   . Essential hypertension    Current Outpatient Prescriptions  Medication Sig Dispense Refill  . acetaminophen (TYLENOL) 500 MG tablet Take 2 tablets (1,000 mg total) by mouth every 8 (eight) hours as needed for pain.      Marland Kitchen. FLUoxetine (PROZAC) 40 MG capsule Take 1 capsule (40 mg total) by mouth daily.  30 capsule  2  . glipiZIDE (GLUCOTROL) 10 MG tablet TAKE ONE  TABLET BY MOUTH ONCE DAILY WITH MEALS  90 tablet  0  . glucose blood test strip Use as instructed  100 each  12  . insulin NPH-regular Human (NOVOLIN 70/30 RELION) (70-30) 100 UNIT/ML injection Inject 40 Units into the skin 2 (two) times daily with a meal.  10 mL  12  . lisinopril (PRINIVIL,ZESTRIL) 10 MG tablet TAKE ONE TABLET BY MOUTH EVERY DAY  90 tablet  0  . metFORMIN (GLUCOPHAGE XR) 500 MG 24 hr tablet Take 2 tablets (1,000 mg total) by mouth daily with breakfast.  60 tablet  5  . pravastatin (PRAVACHOL) 40 MG tablet Take 1 tablet (40 mg total) by mouth every morning.  90 tablet  6  . traMADol (ULTRAM) 50 MG tablet Take 1 tablet (50 mg total) by mouth every 12 (twelve) hours as needed.  60 tablet  0   No current facility-administered medications for this visit.    Review of Systems: General: Denies fever, chills, diaphoresis, appetite change and fatigue.  Respiratory: Denies SOB, DOE, cough, and wheezing.   Cardiovascular: Denies chest pain and palpitations.  Gastrointestinal: Denies nausea, vomiting, abdominal pain, and diarrhea.  Genitourinary: Denies dysuria, increased frequency, and flank pain. Endocrine: Positive for polyuria. Denies hot or cold intolerance, and polydipsia.  Musculoskeletal: Positive for right elbow pain. Denies myalgias, back pain, joint swelling, and gait problem.  Skin: Denies pallor, rash and wounds.  Neurological: Denies dizziness, seizures, syncope, weakness, lightheadedness, numbness and headaches.  Psychiatric/Behavioral: Denies mood changes, and sleep disturbances.  Objective:   Physical Exam: Filed Vitals:   10/04/14 1352  BP: 133/70  Pulse: 83  Temp: 98.6 F (37 C)  TempSrc: Oral  Height: 5\' 8"  (1.727 m)  Weight: 214 lb 12.8 oz (97.433 kg)  SpO2: 97%    General: Alert, cooperative, NAD. HEENT: PERRL, EOMI. Moist mucus membranes Neck: Full range of motion without pain, supple, no lymphadenopathy or carotid bruits Lungs: Clear to  ascultation bilaterally, normal work of respiration, no wheezes, rales, rhonchi Heart: RRR, no murmurs, gallops, or rubs Abdomen: Soft, non-tender, non-distended, BS + Extremities: No cyanosis, clubbing, or edema. Right elbow w/ tenderness in the radial groove and over the lateral epicondyle. No obvious swelling or redness. Pain w/ active/passive supination.  Neurologic: Alert & oriented X3, cranial nerves II-XII intact, strength grossly intact, sensation intact to light touch   Assessment & Plan:   Please see problem based assessment and plan.

## 2014-10-04 NOTE — Progress Notes (Signed)
Patient asked about her annual diabetes eye exam at visit today: she says she will make an appointment and go to eye doctor in January 2016 and ask them to fax us the report.

## 2014-10-04 NOTE — Assessment & Plan Note (Signed)
Patient claims that Pravachol was discontinued at a recent clinic visit, however, no documentation of this. Does not state that she had an adverse effect to the medication.  -Refilled Pravachol

## 2014-10-04 NOTE — Patient Instructions (Signed)
General Instructions:  1. Please schedule a follow up appointment for 3 months.   2. Please take all medications as prescribed.   PLEASE TAKE YOUR INSULIN!!!!!!  Start taking Metformin XR 100 mg daily, instead of normal Metformin. THIS MEDICATION IS BETTER TOLERATED AND WILL NOT CAUSE GI UPSET AS MUCH.  Get Right elbow XR.  3. If you have worsening of your symptoms or new symptoms arise, please call the clinic [phone]), or go to the ER immediately if symptoms are severe.  Please bring your medicines with you each time you come to clinic.  Medicines may include prescription medications, over-the-counter medications, herbal remedies, eye drops, vitamins, or other pills.   Progress Toward Treatment Goals:  Treatment Goal 07/11/2014  Hemoglobin A1C improved  Blood pressure at goal    Self Care Goals & Plans:  Self Care Goal 10/04/2014  Manage my medications take my medicines as prescribed; bring my medications to every visit; refill my medications on time  Monitor my health keep track of my blood glucose; bring my glucose meter and log to each visit  Eat healthy foods drink diet soda or water instead of juice or soda; eat more vegetables; eat foods that are low in salt; eat baked foods instead of fried foods; eat fruit for snacks and desserts  Be physically active -  Meeting treatment goals -    Home Blood Glucose Monitoring 07/11/2014  Check my blood sugar 3 times a day  When to check my blood sugar before meals     Care Management & Community Referrals:  Referral 07/11/2014  Referrals made for care management support -  Referrals made to community resources none

## 2014-10-04 NOTE — Assessment & Plan Note (Signed)
Refilled Prozac

## 2014-10-04 NOTE — Assessment & Plan Note (Signed)
Most likely a lateral epicondylitis given her symptoms and examination findings. XR performed showed no acute osseous abnormality, but did suggest an irregularity of the articular surface of the radial head likely the sequela of a prior healed radial head fracture.  -Advised rest, ice/heat, compression, elevation -Also advised Ibuprofen 400-600 mg q8h prn for 2-4 weeks.  -If pain is unchanged, can consider further workup and management.

## 2014-10-05 LAB — MICROALBUMIN / CREATININE URINE RATIO
Creatinine, Urine: 53.9 mg/dL
MICROALB/CREAT RATIO: 5.6 mg/g (ref 0.0–30.0)
Microalb, Ur: 0.3 mg/dL (ref <2.0)

## 2014-10-05 NOTE — Progress Notes (Signed)
INTERNAL MEDICINE TEACHING ATTENDING ADDENDUM - Tynia Wiers, MD: I reviewed and discussed at the time of visit with the resident Dr. Jones, the patient's medical history, physical examination, diagnosis and results of pertinent tests and treatment and I agree with the patient's care as documented.  

## 2014-11-18 ENCOUNTER — Telehealth: Payer: Self-pay | Admitting: *Deleted

## 2014-11-18 NOTE — Telephone Encounter (Signed)
Pt request refill on Tessalon 100 mg.   This has been removed from her med list.

## 2014-11-20 ENCOUNTER — Encounter (HOSPITAL_COMMUNITY): Payer: Self-pay | Admitting: Emergency Medicine

## 2014-11-20 ENCOUNTER — Emergency Department (HOSPITAL_COMMUNITY): Payer: Self-pay

## 2014-11-20 ENCOUNTER — Emergency Department (HOSPITAL_COMMUNITY)
Admission: EM | Admit: 2014-11-20 | Discharge: 2014-11-21 | Disposition: A | Discharge status: Home / Self Care | Payer: Self-pay | Attending: Emergency Medicine | Admitting: Emergency Medicine

## 2014-11-20 DIAGNOSIS — Z8719 Personal history of other diseases of the digestive system: Secondary | ICD-10-CM | POA: Insufficient documentation

## 2014-11-20 DIAGNOSIS — R059 Cough, unspecified: Secondary | ICD-10-CM

## 2014-11-20 DIAGNOSIS — Z87442 Personal history of urinary calculi: Secondary | ICD-10-CM | POA: Insufficient documentation

## 2014-11-20 DIAGNOSIS — B9789 Other viral agents as the cause of diseases classified elsewhere: Secondary | ICD-10-CM

## 2014-11-20 DIAGNOSIS — Z8669 Personal history of other diseases of the nervous system and sense organs: Secondary | ICD-10-CM | POA: Insufficient documentation

## 2014-11-20 DIAGNOSIS — Z8742 Personal history of other diseases of the female genital tract: Secondary | ICD-10-CM | POA: Insufficient documentation

## 2014-11-20 DIAGNOSIS — I10 Essential (primary) hypertension: Secondary | ICD-10-CM | POA: Insufficient documentation

## 2014-11-20 DIAGNOSIS — E119 Type 2 diabetes mellitus without complications: Secondary | ICD-10-CM | POA: Insufficient documentation

## 2014-11-20 DIAGNOSIS — E669 Obesity, unspecified: Secondary | ICD-10-CM | POA: Insufficient documentation

## 2014-11-20 DIAGNOSIS — Z794 Long term (current) use of insulin: Secondary | ICD-10-CM | POA: Insufficient documentation

## 2014-11-20 DIAGNOSIS — Z79899 Other long term (current) drug therapy: Secondary | ICD-10-CM | POA: Insufficient documentation

## 2014-11-20 DIAGNOSIS — R05 Cough: Secondary | ICD-10-CM

## 2014-11-20 DIAGNOSIS — R0989 Other specified symptoms and signs involving the circulatory and respiratory systems: Secondary | ICD-10-CM

## 2014-11-20 DIAGNOSIS — Z87891 Personal history of nicotine dependence: Secondary | ICD-10-CM | POA: Insufficient documentation

## 2014-11-20 DIAGNOSIS — F329 Major depressive disorder, single episode, unspecified: Secondary | ICD-10-CM | POA: Insufficient documentation

## 2014-11-20 DIAGNOSIS — J069 Acute upper respiratory infection, unspecified: Secondary | ICD-10-CM | POA: Insufficient documentation

## 2014-11-20 DIAGNOSIS — Z8619 Personal history of other infectious and parasitic diseases: Secondary | ICD-10-CM | POA: Insufficient documentation

## 2014-11-20 NOTE — ED Notes (Signed)
Pt. reports productive cough , chest congestion , nasal congestion onset yesterday with chills.

## 2014-11-21 MED ORDER — ACETAMINOPHEN-CODEINE #3 300-30 MG PO TABS
1.0000 | ORAL_TABLET | Freq: Once | ORAL | Status: AC
  Administered 2014-11-21: 1 via ORAL
  Filled 2014-11-21: qty 1

## 2014-11-21 MED ORDER — ACETAMINOPHEN-CODEINE #3 300-30 MG PO TABS
2.0000 | ORAL_TABLET | Freq: Once | ORAL | Status: AC
  Administered 2014-11-21: 1 via ORAL
  Filled 2014-11-21: qty 2

## 2014-11-21 MED ORDER — ACETAMINOPHEN-CODEINE #3 300-30 MG PO TABS: 1.0000 | ORAL_TABLET | Freq: Two times a day (BID) | ORAL | Status: DC | PRN

## 2014-11-21 NOTE — Discharge Instructions (Signed)
Cough, Adult Amy Norris, you were seen today for a cough. Your chest x-ray did not show a pneumonia. You likely have a cold. He can take Motrin as needed for pain relief. Take medication as prescribed and follow-up with her primary care physician within 3 days for continued management. If any further symptoms worsen come back to the emergency department immediately for repeat evaluation. Thank you.  A cough is a reflex. It helps you clear your throat and airways. A cough can help heal your body. A cough can last 2 or 3 weeks (acute) or may last more than 8 weeks (chronic). Some common causes of a cough can include an infection, allergy, or a cold. HOME CARE  Only take medicine as told by your doctor.  If given, take your medicines (antibiotics) as told. Finish them even if you start to feel better.  Use a cold steam vaporizer or humidifier in your home. This can help loosen thick spit (secretions).  Sleep so you are almost sitting up (semi-upright). Use pillows to do this. This helps reduce coughing.  Rest as needed.  Stop smoking if you smoke. GET HELP RIGHT AWAY IF:  You have yellowish-white fluid (pus) in your thick spit.  Your cough gets worse.  Your medicine does not reduce coughing, and you are losing sleep.  You cough up blood.  You have trouble breathing.  Your pain gets worse and medicine does not help.  You have a fever. MAKE SURE YOU:   Understand these instructions.  Will watch your condition.  Will get help right away if you are not doing well or get worse. Document Released: 08/29/2011 Document Revised: 05/02/2014 Document Reviewed: 08/29/2011 [facility] Patient Information 2015 [place], [place]. This information is not intended to replace advice given to you by your health care provider. Make sure you discuss any questions you have with your health care provider.

## 2014-11-21 NOTE — ED Provider Notes (Signed)
CSN: [id number]     Arrival date & time 11/20/14  2257 History  This chart was scribed for Amy CrumbleAdeleke Harper Vandervoort, MD by Evon Slackerrance Branch, ED Scribe. This patient was seen in room A10C/A10C and the patient's care was started at 12:06 AM.    Chief Complaint  Patient presents with  . Cough   Patient is a 48 y.o. female presenting with cough. The history is provided by the patient. No language interpreter was used.  Cough Cough characteristics:  Productive Sputum characteristics:  Yellow Severity:  Mild Onset quality:  Gradual Duration:  2 days Timing:  Intermittent Relieved by:  Nothing Worsened by:  Nothing tried Ineffective treatments:  Cough suppressants Associated symptoms: chills, fever and sinus congestion    HPI Comments: Amy Norris is a 48 y.o. female with PMHx of bronchitis who presents to the Emergency Department complaining of productive cough of yellow sputum onset 1 day ago. She states she has associated subjective fever, chills, congestion, and chest tightness. She states that this doesn't feel like her bronchitis. She states that she has recently been around her daughters boyfriend who also has a cough. She states that she has been taking tessalon perles with no relief. She doesn't report any other symptoms.   Past Medical History  Diagnosis Date  . Bronchitis   . Obesity   . GERD (gastroesophageal reflux disease)   . Diabetes mellitus     Type II  . Depression   . Sleep apnea, obstructive   . Herpes simplex type 1 infection     vaginal  . Menorrhagia   . Carpal tunnel syndrome, bilateral   . Renal calculi   . Hypokalemia     2/2 diarrhea  . Cholelithiasis   . Renal calculus   . Breast mass   . Anal fissure   . Polycystic ovarian disease   . Essential hypertension    Past Surgical History  Procedure Laterality Date  . Tubal ligation  1991    1991  . Cholecystectomy  11/08   Family History  Problem Relation Age of Onset  . Hypertension Mother   . Diabetes  Father    History  Substance Use Topics  . Smoking status: Former Smoker    Types: Cigarettes    Quit date: 12/30/1978  . Smokeless tobacco: Not on file  . Alcohol Use: 0.0 oz/week   OB History    No data available     Review of Systems  Constitutional: Positive for fever and chills.  HENT: Positive for congestion.   Respiratory: Positive for cough and chest tightness.   All other systems reviewed and are negative.    Allergies  Vicodin and Metformin and related  Home Medications   Prior to Admission medications   Medication Sig Start Date End Date Taking? Authorizing Provider  acetaminophen (TYLENOL) 500 MG tablet Take 2 tablets (1,000 mg total) by mouth every 8 (eight) hours as needed for pain. 03/10/13   Lollie SailsAndrew B Wallace, MD  FLUoxetine (PROZAC) 40 MG capsule Take 1 capsule (40 mg total) by mouth daily. 10/04/14   Courtney ParisEden W Jones, MD  glipiZIDE (GLUCOTROL) 10 MG tablet TAKE ONE TABLET BY MOUTH ONCE DAILY WITH MEALS 10/04/14   Courtney ParisEden W Jones, MD  glucose blood test strip Use as instructed 09/24/11 07/11/14  Mathis DadMichele T Ho, MD  insulin NPH-regular Human (NOVOLIN 70/30 RELION) (70-30) 100 UNIT/ML injection Inject 40 Units into the skin 2 (two) times daily with a meal. 10/04/14   Courtney ParisEden W Jones,  MD  lisinopril (PRINIVIL,ZESTRIL) 10 MG tablet TAKE ONE TABLET BY MOUTH EVERY DAY 10/04/14   Courtney ParisEden W Jones, MD  metFORMIN (GLUCOPHAGE XR) 500 MG 24 hr tablet Take 2 tablets (1,000 mg total) by mouth daily with breakfast. 10/04/14   Courtney ParisEden W Jones, MD  pravastatin (PRAVACHOL) 40 MG tablet Take 1 tablet (40 mg total) by mouth every morning. 10/04/14 10/04/15  Courtney ParisEden W Jones, MD  traMADol (ULTRAM) 50 MG tablet Take 1 tablet (50 mg total) by mouth every 12 (twelve) hours as needed. 07/11/14   Annett Gularacy N McLean, MD   Triage Vitals: BP 137/72 mmHg  Pulse 84  Temp(Src) 98.5 F (36.9 C)  Resp 16  Ht 5\' 7"  (1.702 m)  Wt 248 lb (112.492 kg)  BMI 38.83 kg/m2  SpO2 99%  LMP 11/05/2014  Physical Exam   Constitutional: She is oriented to person, place, and time. She appears well-developed and well-nourished. No distress.  HENT:  Head: Normocephalic and atraumatic.  Eyes: Conjunctivae and EOM are normal.  Neck: Neck supple. No tracheal deviation present.  Cardiovascular: Normal rate.   Pulmonary/Chest: Effort normal. No respiratory distress.  Musculoskeletal: Normal range of motion.  Neurological: She is alert and oriented to person, place, and time.  Skin: Skin is warm and dry.  Psychiatric: She has a normal mood and affect. Her behavior is normal.  Nursing note and vitals reviewed.   ED Course  Procedures (including critical care time) DIAGNOSTIC STUDIES: Oxygen Saturation is 99% on RA, normal by my interpretation.    COORDINATION OF CARE: 12:12 AM-Discussed treatment plan with pt at bedside and pt agreed to plan.     Labs Review Labs Reviewed - No data to display  Imaging Review Dg Chest 2 View  11/20/2014   CLINICAL DATA:  Cough and congestion  EXAM: CHEST  2 VIEW  COMPARISON:  1/20/7  FINDINGS: The heart size and mediastinal contours are within normal limits. Both lungs are clear. Degenerative disc disease noted within the thoracic and lumbar spine.  IMPRESSION: No active cardiopulmonary disease.   Electronically Signed   By: Signa Kellaylor  Stroud M.D.   On: 11/20/2014 23:46     EKG Interpretation None      MDM   Final diagnoses:  Cough  Chest congestion   Patient presents emergency department out of concern for cough. She also admits to sick contacts. She denies any significant fevers or sweating. Chest x-ray does not show any pneumonia. Antibiotics are not recommended for this patient, as this is likely a viral URI versus viral bronchitis. She was given Tylenol 3 with codeine for symptomatic relief. Vital Signs remain within her normal limits and she is safe for discharge.     I personally performed the services described in this documentation, which was scribed in  my presence. The recorded information has been reviewed and is accurate.      Amy CrumbleAdeleke Kinzleigh Kandler, MD 11/21/14 [phone]

## 2014-11-23 ENCOUNTER — Encounter: Payer: Self-pay | Admitting: Pulmonary Disease

## 2014-11-23 ENCOUNTER — Ambulatory Visit (INDEPENDENT_AMBULATORY_CARE_PROVIDER_SITE_OTHER): Payer: Self-pay | Admitting: Pulmonary Disease

## 2014-11-23 VITALS — BP 136/77 | HR 80 | Temp 98.6°F | Wt 248.4 lb

## 2014-11-23 DIAGNOSIS — IMO0002 Reserved for concepts with insufficient information to code with codable children: Secondary | ICD-10-CM

## 2014-11-23 DIAGNOSIS — E1165 Type 2 diabetes mellitus with hyperglycemia: Secondary | ICD-10-CM

## 2014-11-23 DIAGNOSIS — J209 Acute bronchitis, unspecified: Secondary | ICD-10-CM

## 2014-11-23 LAB — GLUCOSE, CAPILLARY: Glucose-Capillary: 235 mg/dL — ABNORMAL HIGH (ref 70–99)

## 2014-11-23 MED ORDER — INSULIN ASPART PROT & ASPART (70-30 MIX) 100 UNIT/ML ~~LOC~~ SUSP: 40.0000 [IU] | Freq: Two times a day (BID) | SUBCUTANEOUS | Status: DC

## 2014-11-23 MED ORDER — PROMETHAZINE-DM 6.25-15 MG/5ML PO SYRP: 5.0000 mL | ORAL_SOLUTION | ORAL | Status: DC | PRN

## 2014-11-23 MED ORDER — AZITHROMYCIN 250 MG PO TABS: ORAL_TABLET | ORAL | Status: AC

## 2014-11-23 NOTE — Progress Notes (Signed)
Medicine attending: I personally interviewed this patient together with resident physician Dr. Griffin BasilJennifer Krall and I concur with her evaluation and management plan. We will make appropriate adjustments in her medications. We will add an antidepressant.

## 2014-11-23 NOTE — Patient Instructions (Signed)
Acute Bronchitis Bronchitis is when the airways that extend from the windpipe into the lungs get red, puffy, and painful (inflamed). Bronchitis often causes thick spit (mucus) to develop. This leads to a cough. A cough is the most common symptom of bronchitis. In acute bronchitis, the condition usually begins suddenly and goes away over time (usually in 2 weeks). Smoking, allergies, and asthma can make bronchitis worse. Repeated episodes of bronchitis may cause more lung problems. HOME CARE  Rest.  Drink enough fluids to keep your pee (urine) clear or pale yellow (unless you need to limit fluids as told by your doctor).  Only take over-the-counter or prescription medicines as told by your doctor.  Avoid smoking and secondhand smoke. These can make bronchitis worse. If you are a smoker, think about using nicotine gum or skin patches. Quitting smoking will help your lungs heal faster.  Reduce the chance of getting bronchitis again by:  Washing your hands often.  Avoiding people with cold symptoms.  Trying not to touch your hands to your mouth, nose, or eyes.  Follow up with your doctor as told. GET HELP IF: Your symptoms do not improve after 1 week of treatment. Symptoms include:  Cough.  Fever.  Coughing up thick spit.  Body aches.  Chest congestion.  Chills.  Shortness of breath.  Sore throat. GET HELP RIGHT AWAY IF:   You have an increased fever.  You have chills.  You have severe shortness of breath.  You have bloody thick spit (sputum).  You throw up (vomit) often.  You lose too much body fluid (dehydration).  You have a severe headache.  You faint. MAKE SURE YOU:   Understand these instructions.  Will watch your condition.  Will get help right away if you are not doing well or get worse. Document Released: 06/03/2008 Document Revised: 08/18/2013 Document Reviewed: 06/08/2013 [employer] Patient Information 2015 [employer]. This information is not  intended to replace advice given to you by your health care provider. Make sure you discuss any questions you have with your health care provider.  

## 2014-11-23 NOTE — Assessment & Plan Note (Addendum)
Assessment: CXR 11/20/2014 with no acute cardiopulmonary disease. Likely acute bronchitis vs sinusitis.  Plan: -Azithromycin 500 mg on day 1 followed by 250 mg once daily on days 2-5 -Promethazine DM 5ml Q4hr prn cough

## 2014-11-23 NOTE — Progress Notes (Signed)
Subjective:   Patient ID: Amy Norris, female    DOB: 1966-05-31, 48 y.o.   MRN: [id number]  HPI Amy Norris is a 48 year old woman with history of GERD, DM2, OSA, HTN presenting for follow up.  Was seen in the ED 11/20/2014 for cough. +sick contacts. CXR with no active cardiopulmonary disease. Given Tylenol 3 with codeine for symptomatic relief. Today she reports her symptoms feel worse. Symptoms started on Friday. She has also been taking allergy medication and tessalon perls with minimal relief. She has subjective fevers and chills, nose congestion, sinus pain/congestion, sore throat, chest tightness, shortness of breath, cough productive of yellow/green sputum, nausea. Denies emesis, diarrhea, dysuria. She had an influenza vaccine this year.   Right elbow pain: Improved  DM2: Patient takes Glipizide 10 mg qAM, Metformin XR 1000 mg qd, and 70/30 40 units bid. Reports she has not been taking the insulin for the past month because she cannot afford it.  Review of Systems  Constitutional: Positive for fever and chills.  HENT: Positive for congestion, sinus pressure and sore throat. Negative for ear pain.   Respiratory: Positive for cough, chest tightness and shortness of breath. Negative for wheezing.   Cardiovascular: Negative for chest pain.  Gastrointestinal: Positive for nausea. Negative for vomiting, abdominal pain and diarrhea.  Genitourinary: Negative for dysuria.    Past Medical History  Diagnosis Date  . Bronchitis   . Obesity   . GERD (gastroesophageal reflux disease)   . Diabetes mellitus     Type II  . Depression   . Sleep apnea, obstructive   . Herpes simplex type 1 infection     vaginal  . Menorrhagia   . Carpal tunnel syndrome, bilateral   . Renal calculi   . Hypokalemia     2/2 diarrhea  . Cholelithiasis   . Renal calculus   . Breast mass   . Anal fissure   . Polycystic ovarian disease   . Essential hypertension    Current Outpatient  Prescriptions on File Prior to Visit  Medication Sig Dispense Refill  . acetaminophen (TYLENOL) 500 MG tablet Take 2 tablets (1,000 mg total) by mouth every 8 (eight) hours as needed for pain.    Marland Kitchen. acetaminophen-codeine (TYLENOL #3) 300-30 MG per tablet Take 1 tablet by mouth 2 (two) times daily as needed (coughing). 8 tablet 0  . FLUoxetine (PROZAC) 40 MG capsule Take 1 capsule (40 mg total) by mouth daily. 30 capsule 2  . glipiZIDE (GLUCOTROL) 10 MG tablet TAKE ONE TABLET BY MOUTH ONCE DAILY WITH MEALS 90 tablet 0  . glucose blood test strip Use as instructed 100 each 12  . insulin NPH-regular Human (NOVOLIN 70/30 RELION) (70-30) 100 UNIT/ML injection Inject 40 Units into the skin 2 (two) times daily with a meal. 10 mL 12  . lisinopril (PRINIVIL,ZESTRIL) 10 MG tablet TAKE ONE TABLET BY MOUTH EVERY DAY 90 tablet 0  . metFORMIN (GLUCOPHAGE XR) 500 MG 24 hr tablet Take 2 tablets (1,000 mg total) by mouth daily with breakfast. 60 tablet 5  . pravastatin (PRAVACHOL) 40 MG tablet Take 1 tablet (40 mg total) by mouth every morning. 90 tablet 6  . traMADol (ULTRAM) 50 MG tablet Take 1 tablet (50 mg total) by mouth every 12 (twelve) hours as needed. 60 tablet 0   No current facility-administered medications on file prior to visit.   Today's Vitals   11/23/14 0848  BP: 136/77  Pulse: 80  Temp: 98.6 F (37 C)  TempSrc: Oral  SpO2: 98%   Objective:  Physical Exam  Constitutional: She is oriented to person, place, and time. She appears well-developed and well-nourished. No distress.  HENT:  Head: Normocephalic and atraumatic.  Mouth/Throat: Oropharynx is clear and moist. No oropharyngeal exudate.  Eyes: Conjunctivae are normal.  Neck: Neck supple.  Cardiovascular: Normal rate and regular rhythm.   Pulmonary/Chest: Breath sounds normal. No respiratory distress. She has no wheezes.  Abdominal: Soft. She exhibits no distension. There is no tenderness.  Musculoskeletal: Normal range of motion.  She exhibits no edema.  Lymphadenopathy:    She has no cervical adenopathy.  Neurological: She is alert and oriented to person, place, and time.  Skin: Skin is warm and dry. No rash noted.  Psychiatric: She has a normal mood and affect.    Assessment & Plan:  Please refer to problem based charting.

## 2014-11-23 NOTE — Assessment & Plan Note (Signed)
-  Changed insulin from Novolin NPH-regular 70/30 40 units BID to Novolog 70/30 40 units BID. -Sample vial and syringes given.

## 2014-11-23 NOTE — Progress Notes (Signed)
Medicine attending: I personally interviewed and examined this patient together with resident physician Dr. Griffin BasilJennifer Krall and I concur with her evaluation and management plan. Patient likely has a viral bronchitis but due to her persistent cough and sputum production and underlying diabetes, we will prescribe an antibiotic for 5 days. She is advised that her cough will likely persist past these 5 days.

## 2014-12-07 ENCOUNTER — Telehealth: Payer: Self-pay | Admitting: Dietician

## 2014-12-07 NOTE — Telephone Encounter (Signed)
Patient says she plans to get her diabetic eye exam when she gets the money and expects this to be in February 2016. Reminded her we can do it in January when she comes for her appointment with Dr. Yetta BarreJones if she changes her mind.

## 2015-01-04 ENCOUNTER — Ambulatory Visit: Payer: Self-pay | Admitting: Internal Medicine

## 2015-01-05 ENCOUNTER — Ambulatory Visit (INDEPENDENT_AMBULATORY_CARE_PROVIDER_SITE_OTHER): Payer: Self-pay | Admitting: Internal Medicine

## 2015-01-05 ENCOUNTER — Encounter: Payer: Self-pay | Admitting: Internal Medicine

## 2015-01-05 VITALS — BP 139/71 | HR 79 | Temp 98.6°F | Ht 67.5 in | Wt 249.5 lb

## 2015-01-05 DIAGNOSIS — IMO0002 Reserved for concepts with insufficient information to code with codable children: Secondary | ICD-10-CM

## 2015-01-05 DIAGNOSIS — E1165 Type 2 diabetes mellitus with hyperglycemia: Secondary | ICD-10-CM

## 2015-01-05 DIAGNOSIS — I10 Essential (primary) hypertension: Secondary | ICD-10-CM

## 2015-01-05 DIAGNOSIS — L298 Other pruritus: Secondary | ICD-10-CM

## 2015-01-05 DIAGNOSIS — Z794 Long term (current) use of insulin: Secondary | ICD-10-CM

## 2015-01-05 DIAGNOSIS — Z87891 Personal history of nicotine dependence: Secondary | ICD-10-CM

## 2015-01-05 DIAGNOSIS — N898 Other specified noninflammatory disorders of vagina: Secondary | ICD-10-CM

## 2015-01-05 DIAGNOSIS — Z0000 Encounter for general adult medical examination without abnormal findings: Secondary | ICD-10-CM

## 2015-01-05 LAB — POCT URINALYSIS DIPSTICK
BILIRUBIN UA: NEGATIVE
GLUCOSE UA: 500
KETONES UA: NEGATIVE
Nitrite, UA: NEGATIVE
PROTEIN UA: NEGATIVE
Urobilinogen, UA: 0.2
pH, UA: 5.5

## 2015-01-05 MED ORDER — FLUCONAZOLE 150 MG PO TABS: 150.0000 mg | ORAL_TABLET | Freq: Once | ORAL | Status: DC

## 2015-01-05 MED ORDER — METFORMIN HCL ER 500 MG PO TB24: 1000.0000 mg | ORAL_TABLET | Freq: Every day | ORAL | Status: DC

## 2015-01-05 MED ORDER — GLUCOSE BLOOD VI STRP: ORAL_STRIP | Status: DC

## 2015-01-05 MED ORDER — INSULIN ASPART PROT & ASPART (70-30 MIX) 100 UNIT/ML ~~LOC~~ SUSP: 40.0000 [IU] | Freq: Two times a day (BID) | SUBCUTANEOUS | Status: DC

## 2015-01-05 NOTE — Progress Notes (Signed)
Patient ID: Amy HeckleHenrietta P Norris, female   DOB: 1966/01/07, 49 y.o.   MRN: [id number]    Subjective:   Patient ID: Amy Norris female    DOB: 1966/01/07 49 y.o.    MRN: [id number] Health Maintenance Due: Health Maintenance Due  Topic Date Due  . OPHTHALMOLOGY EXAM  09/14/2011  . PAP SMEAR  02/14/2014  . HEMOGLOBIN A1C  01/04/2015    _________________________________________________  HPI: Amy Norris is a 49 y.o. female here for an acute visit for vaginal itching.  Pt has a PMH outlined below.  Please see problem-based charting assessment and plan note for further details of medical issues addressed at today's visit.  PMH: Past Medical History  Diagnosis Date  . Bronchitis   . Obesity   . GERD (gastroesophageal reflux disease)   . Diabetes mellitus     Type II  . Depression   . Sleep apnea, obstructive   . Herpes simplex type 1 infection     vaginal  . Menorrhagia   . Carpal tunnel syndrome, bilateral   . Renal calculi   . Hypokalemia     2/2 diarrhea  . Cholelithiasis   . Renal calculus   . Breast mass   . Anal fissure   . Polycystic ovarian disease   . Essential hypertension     Medications: Current Outpatient Prescriptions on File Prior to Visit  Medication Sig Dispense Refill  . acetaminophen (TYLENOL) 500 MG tablet Take 2 tablets (1,000 mg total) by mouth every 8 (eight) hours as needed for pain.    Marland Kitchen. acetaminophen-codeine (TYLENOL #3) 300-30 MG per tablet Take 1 tablet by mouth 2 (two) times daily as needed (coughing). 8 tablet 0  . FLUoxetine (PROZAC) 40 MG capsule Take 1 capsule (40 mg total) by mouth daily. 30 capsule 2  . glipiZIDE (GLUCOTROL) 10 MG tablet TAKE ONE TABLET BY MOUTH ONCE DAILY WITH MEALS 90 tablet 0  . glucose blood test strip Use as instructed 100 each 12  . insulin aspart protamine- aspart (NOVOLOG MIX 70/30) (70-30) 100 UNIT/ML injection Inject 0.4 mLs (40 Units total) into the skin 2 (two) times daily with a meal. 10 mL 3  .  lisinopril (PRINIVIL,ZESTRIL) 10 MG tablet TAKE ONE TABLET BY MOUTH EVERY DAY 90 tablet 0  . metFORMIN (GLUCOPHAGE XR) 500 MG 24 hr tablet Take 2 tablets (1,000 mg total) by mouth daily with breakfast. 60 tablet 5  . pravastatin (PRAVACHOL) 40 MG tablet Take 1 tablet (40 mg total) by mouth every morning. 90 tablet 6  . promethazine-dextromethorphan (PROMETHAZINE-DM) 6.25-15 MG/5ML syrup Take 5 mLs by mouth every 4 (four) hours as needed for cough. Maximum of 6 doses per day. 118 mL 0  . traMADol (ULTRAM) 50 MG tablet Take 1 tablet (50 mg total) by mouth every 12 (twelve) hours as needed. 60 tablet 0   No current facility-administered medications on file prior to visit.    Allergies: Allergies  Allergen Reactions  . Vicodin [Hydrocodone-Acetaminophen] Itching    Patient states that she can take this medication. Must take with benadryl.  . Metformin And Related Nausea And Vomiting    Patient states that she can take this medication    FH: Family History  Problem Relation Age of Onset  . Hypertension Mother   . Diabetes Father     SH: History   Social History  . Marital Status: Married    Spouse Name: N/A    Number of Children: N/A  . Years of  Education: N/A   Occupational History  . [employer]   Social History Main Topics  . Smoking status: Former Smoker    Types: Cigarettes    Quit date: 12/30/1978  . Smokeless tobacco: None  . Alcohol Use: 0.0 oz/week  . Drug Use: No  . Sexual Activity: None   Other Topics Concern  . None   Social History Narrative    Smoked many years ago drinks 1-2 beers on Friday and Saturdays no illegal drug use. Married and lives with her husband and sister-in-law son daughter and grandson.   Financial assistance approved for 100% discount at [facility] and has [facility] card per Rudell Cobb as of August 06, 2010 6:27 PM    Review of Systems: Constitutional: Negative for fever, chills and weight loss.  Eyes: Negative for blurred vision.    Respiratory: Negative for cough and shortness of breath.  Cardiovascular: Negative for chest pain, palpitations and leg swelling.  Gastrointestinal: Negative for nausea, vomiting, abdominal pain, diarrhea, constipation and blood in stool.  Genitourinary: Negative for dysuria, urgency and frequency.  Musculoskeletal: Negative for myalgias and back pain.  Neurological: Negative for dizziness, weakness and headaches.     Objective:   Vital Signs: Filed Vitals:   01/05/15 1332  BP: 139/71  Pulse: 79  Temp: 98.6 F (37 C)  TempSrc: Oral  Height: 5' 7.5" (1.715 m)  Weight: 249 lb 8 oz (113.172 kg)  SpO2: 99%    BP Readings from Last 3 Encounters:  01/05/15 139/71  11/23/14 136/77  11/21/14 131/72    Physical Exam: Constitutional: Vital signs reviewed.  Patient is well-developed and well-nourished in NAD and cooperative with exam.  Head: Normocephalic and atraumatic. Eyes: PERRL, EOMI, conjunctivae nl, no scleral icterus.  Neck: Supple. Cardiovascular: RRR, no MRG. Pulmonary/Chest: normal effort, CTAB, no wheezes, rales, or rhonchi. Abdominal: Soft. NT/ND +BS. Neurological: A&O x3, cranial nerves II-XII are grossly intact, moving all extremities. Extremities: 2+DP b/l; no pitting edema. Skin: Warm, dry and intact. No rash. Genital: WNL, no lesions or discharge noted.    Assessment & Plan:   Assessment and plan was discussed and formulated with my attending.

## 2015-01-05 NOTE — Patient Instructions (Signed)
Thank you for your visit today.   Please return to the internal medicine clinic on 01/10/15 to see Dr. Yetta BarreJones for your diabetes management.  Your current medical regimen is effective;  continue present plan and take all medications as prescribed.   I have sent a prescription for a 1 time dose of fluconazole for possible yeast infection. Please do not use harsh soaps on your private areas.  Use only warm water to flush the area.  Please be sure to bring all of your medications with you to every visit; this includes herbal supplements, vitamins, eye drops, and any over-the-counter medications.   Should you have any questions regarding your medications and/or any new or worsening symptoms, please be sure to call the clinic at [phone].   If you believe that you are suffering from a life threatening condition or one that may result in the loss of limb or function, then you should call 911 or proceed to the nearest Emergency Department.     A healthy lifestyle and preventative care can promote health and wellness.   Maintain regular health, dental, and eye exams.  Eat a healthy diet. Foods like vegetables, fruits, whole grains, low-fat dairy products, and lean protein foods contain the nutrients you need without too many calories. Decrease your intake of foods high in solid fats, added sugars, and salt. Get information about a proper diet from your caregiver, if necessary.  Regular physical exercise is one of the most important things you can do for your health. Most adults should get at least 150 minutes of moderate-intensity exercise (any activity that increases your heart rate and causes you to sweat) each week. In addition, most adults need muscle-strengthening exercises on 2 or more days a week.   Maintain a healthy weight. The body mass index (BMI) is a screening tool to identify possible weight problems. It provides an estimate of body fat based on height and weight. Your caregiver can  help determine your BMI, and can help you achieve or maintain a healthy weight. For adults 20 years and older:  A BMI below 18.5 is considered underweight.  A BMI of 18.5 to 24.9 is normal.  A BMI of 25 to 29.9 is considered overweight.  A BMI of 30 and above is considered obese. Vaginitis Vaginitis is an inflammation of the vagina. It is most often caused by a change in the normal balance of the bacteria and yeast that live in the vagina. This change in balance causes an overgrowth of certain bacteria or yeast, which causes the inflammation. There are different types of vaginitis, but the most common types are:  Bacterial vaginosis.  Yeast infection (candidiasis).  Trichomoniasis vaginitis. This is a sexually transmitted infection (STI).  Viral vaginitis.  Atropic vaginitis.  Allergic vaginitis. CAUSES  The cause depends on the type of vaginitis. Vaginitis can be caused by:  Bacteria (bacterial vaginosis).  Yeast (yeast infection).  A parasite (trichomoniasis vaginitis)  A virus (viral vaginitis).  Low hormone levels (atrophic vaginitis). Low hormone levels can occur during pregnancy, breastfeeding, or after menopause.  Irritants, such as bubble baths, scented tampons, and feminine sprays (allergic vaginitis). Other factors can change the normal balance of the yeast and bacteria that live in the vagina. These include:  Antibiotic medicines.  Poor hygiene.  Diaphragms, vaginal sponges, spermicides, birth control pills, and intrauterine devices (IUD).  Sexual intercourse.  Infection.  Uncontrolled diabetes.  A weakened immune system. SYMPTOMS  Symptoms can vary depending on the cause of the  vaginitis. Common symptoms include:  Abnormal vaginal discharge.  The discharge is white, gray, or yellow with bacterial vaginosis.  The discharge is thick, white, and cheesy with a yeast infection.  The discharge is frothy and yellow or greenish with  trichomoniasis.  A bad vaginal odor.  The odor is fishy with bacterial vaginosis.  Vaginal itching, pain, or swelling.  Painful intercourse.  Pain or burning when urinating. Sometimes, there are no symptoms. TREATMENT  Treatment will vary depending on the type of infection.   Bacterial vaginosis and trichomoniasis are often treated with antibiotic creams or pills.  Yeast infections are often treated with antifungal medicines, such as vaginal creams or suppositories.  Viral vaginitis has no cure, but symptoms can be treated with medicines that relieve discomfort. Your sexual partner should be treated as well.  Atrophic vaginitis may be treated with an estrogen cream, pill, suppository, or vaginal ring. If vaginal dryness occurs, lubricants and moisturizing creams may help. You may be told to avoid scented soaps, sprays, or douches.  Allergic vaginitis treatment involves quitting the use of the product that is causing the problem. Vaginal creams can be used to treat the symptoms. HOME CARE INSTRUCTIONS   Take all medicines as directed by your caregiver.  Keep your genital area clean and dry. Avoid soap and only rinse the area with water.  Avoid douching. It can remove the healthy bacteria in the vagina.  Do not use tampons or have sexual intercourse until your vaginitis has been treated. Use sanitary pads while you have vaginitis.  Wipe from front to back. This avoids the spread of bacteria from the rectum to the vagina.  Let air reach your genital area.  Wear cotton underwear to decrease moisture buildup.  Avoid wearing underwear while you sleep until your vaginitis is gone.  Avoid tight pants and underwear or nylons without a cotton panel.  Take off wet clothing (especially bathing suits) as soon as possible.  Use mild, non-scented products. Avoid using irritants, such as:  Scented feminine sprays.  Fabric softeners.  Scented detergents.  Scented  tampons.  Scented soaps or bubble baths.  Practice safe sex and use condoms. Condoms may prevent the spread of trichomoniasis and viral vaginitis. SEEK MEDICAL CARE IF:   You have abdominal pain.  You have a fever or persistent symptoms for more than 2-3 days.  You have a fever and your symptoms suddenly get worse. Document Released: 10/13/2007 Document Revised: 09/09/2012 Document Reviewed: 05/28/2012 [facility] Patient Information 2015 [place], [place]. This information is not intended to replace advice given to you by your health care provider. Make sure you discuss any questions you have with your health care provider.

## 2015-01-05 NOTE — Progress Notes (Signed)
Internal Medicine Clinic Attending  Case discussed with Dr. Gill soon after the resident saw the patient.  We reviewed the resident's history and exam and pertinent patient test results.  I agree with the assessment, diagnosis, and plan of care documented in the resident's note.  

## 2015-01-05 NOTE — Assessment & Plan Note (Signed)
BP today 139/71.  Currently on lisinopril 10mg  daily.   -continue current meds

## 2015-01-06 LAB — CERVICOVAGINAL ANCILLARY ONLY
Wet Prep (BD Affirm): NEGATIVE
Wet Prep (BD Affirm): NEGATIVE
Wet Prep (BD Affirm): NEGATIVE

## 2015-01-06 LAB — URINE CYTOLOGY ANCILLARY ONLY
CHLAMYDIA, DNA PROBE: NEGATIVE
Neisseria Gonorrhea: NEGATIVE

## 2015-01-06 NOTE — Assessment & Plan Note (Addendum)
Pt states she has experienced vaginal itching that began on 12/19.  Reports previous episodes and was given fluconazole at those times with improvement.  Has not tried any OTC medications for a yeast infection.  Denies dysuria, frequency, fever/chills, abdominal pain, N/V, rash.  No h/o STI.  Reports having sexual intercourse with her husband 4-5 months ago.  Heterosexual intercourse with her husband.  Denies any other partners.  Reports that she used to get yeast infections regularly before her monthly menstrual cycle.  No recent abx.  States that she ran out of her insulin and metformin 2 weeks ago.  Pelvic exam does not reveal any genital lesions or discharge. -wet prep  -urine gc/chlamydia  -refilled metformin and insulin -fluconazole 150mg  x 1  -advised imp to control blood sugars as this may increase risk of candida infections  -advised not to use harsh soaps in the genital area, but to use only warm water -f/u with PCP

## 2015-01-08 NOTE — Assessment & Plan Note (Signed)
-  needs pap smear, retinal scan-->defer to PCP

## 2015-01-08 NOTE — Assessment & Plan Note (Addendum)
Pt states she has been out of metformin and insulin for 2 weeks.   -return next week to check HA1c with PCP  -refilled metformin and insulin

## 2015-01-10 ENCOUNTER — Encounter: Payer: Self-pay | Admitting: Internal Medicine

## 2015-01-10 ENCOUNTER — Ambulatory Visit (INDEPENDENT_AMBULATORY_CARE_PROVIDER_SITE_OTHER): Payer: Self-pay | Admitting: Internal Medicine

## 2015-01-10 VITALS — BP 142/83 | HR 86 | Temp 98.6°F | Wt 245.8 lb

## 2015-01-10 DIAGNOSIS — E1165 Type 2 diabetes mellitus with hyperglycemia: Secondary | ICD-10-CM

## 2015-01-10 DIAGNOSIS — L298 Other pruritus: Secondary | ICD-10-CM

## 2015-01-10 DIAGNOSIS — N898 Other specified noninflammatory disorders of vagina: Secondary | ICD-10-CM

## 2015-01-10 DIAGNOSIS — IMO0002 Reserved for concepts with insufficient information to code with codable children: Secondary | ICD-10-CM

## 2015-01-10 DIAGNOSIS — I10 Essential (primary) hypertension: Secondary | ICD-10-CM

## 2015-01-10 LAB — GLUCOSE, CAPILLARY: GLUCOSE-CAPILLARY: 276 mg/dL — AB (ref 70–99)

## 2015-01-10 LAB — POCT GLYCOSYLATED HEMOGLOBIN (HGB A1C): Hemoglobin A1C: 8.8

## 2015-01-10 MED ORDER — GLIPIZIDE 10 MG PO TABS: ORAL_TABLET | ORAL | Status: DC

## 2015-01-10 NOTE — Assessment & Plan Note (Signed)
Lab Results  Component Value Date   HGBA1C 8.8 01/10/2015   HGBA1C 9.4 10/04/2014   HGBA1C 9.1 07/11/2014     Assessment: Diabetes control: fair control Progress toward A1C goal:  improved Comments: Taking Metformin XR 1000 mg daily + Glipizide 10 mg daily. Is also supposed to take Insulin 70/30 40 units bid but states she has not been able to afford this at the drug store recently. Asked extensively about hypoglycemia when she takes her insulin, states she has not had this since she was taking a much higher dose of insulin. Surprisingly, HbA1c is decreased today to 8.8. Claims her average CBG is 250 at home when she checks.   Plan: Medications:  continue current medications; Metformin XR 1000 mg daily + Glipizide 10 mg daily + Insulin 70/30 40 units bid.  Home glucose monitoring: Frequency: once a day Timing: before breakfast Instruction/counseling given: discussed diet Other plans: Recheck BMP at next visit. Ophthalmology appointment to be arranged by patient in 1-2 weeks.

## 2015-01-10 NOTE — Patient Instructions (Signed)
General Instructions:  1. Please schedule a follow up appointment for 3 months.   2. Please take all medications as prescribed.   Take Mevacor 40 mg every night in place of Pravachol.  Continue to take Glipizide 10 mg daily, Metformin XR 1000 mg daily, and start taking insulin 70/30, 40 units twice daily.   3. If you have worsening of your symptoms or new symptoms arise, please call the clinic [phone]), or go to the ER immediately if symptoms are severe.   Please bring your medicines with you each time you come to clinic.  Medicines may include prescription medications, over-the-counter medications, herbal remedies, eye drops, vitamins, or other pills.   Progress Toward Treatment Goals:  Treatment Goal 01/10/2015  Hemoglobin A1C improved  Blood pressure at goal    Self Care Goals & Plans:  Self Care Goal 01/10/2015  Manage my medications take my medicines as prescribed; bring my medications to every visit; refill my medications on time  Monitor my health keep track of my blood glucose; bring my glucose meter and log to each visit  Eat healthy foods eat more vegetables; drink diet soda or water instead of juice or soda; eat foods that are low in salt  Be physically active find an activity I enjoy  Meeting treatment goals maintain the current self-care plan    Home Blood Glucose Monitoring 01/10/2015  Check my blood sugar once a day  When to check my blood sugar before breakfast     Care Management & Community Referrals:  Referral 01/10/2015  Referrals made for care management support none needed  Referrals made to community resources none

## 2015-01-10 NOTE — Assessment & Plan Note (Signed)
Resolved s/p Diflucan 150 mg once. G/C chlamydia negative.

## 2015-01-10 NOTE — Assessment & Plan Note (Signed)
BP Readings from Last 3 Encounters:  01/10/15 142/83  01/05/15 139/71  11/23/14 136/77    Lab Results  Component Value Date   NA 136 07/11/2014   K 4.2 07/11/2014   CREATININE 0.59 07/11/2014    Assessment: Blood pressure control: controlled Progress toward BP goal:  at goal Comments: Very mild elevation of SBP today. Has been compliant w/ Lisinopril 10 mg daily according to the patient.   Plan: Medications:  continue current medications; if elevated at next appointment, will increase dose lisinopril.  Other plans: RTC in 3 months. Recheck BMP at that time.

## 2015-01-10 NOTE — Progress Notes (Signed)
Subjective:   Patient ID: Amy Norris female   DOB: 06/08/1966 48 y.o.   MRN: [id number]  HPI: Amy Norris is a 49 y.o. female w/ PMHx of HTN, OSA, DM type II (last HbA1c 9.1 on 07/11/14), GERD, depression, and chronic back pain, presents to the clinic today for a follow-up visit for her DM. Patient was last seen on 01/08/15 for complaints of vaginal pruritis and issues w/ her diabetes medications. Today she is doing quite well. She claims her vaginal discomfort has resolved. She continues to have compliance issues w/ her medications, specifically her insulin saying that she has trouble affording it. She continues to take her Metformin and Glipizide as directed. She denies any symptoms of hypoglycemia; no diaphoresis, dizziness, lightheadedness, nausea, or tremulousness. She does describe polyuria and polydipsia.  She had previous issues w/ elbow pain d/t strain at work but says this has resolved.   Past Medical History  Diagnosis Date  . Bronchitis   . Obesity   . GERD (gastroesophageal reflux disease)   . Diabetes mellitus     Type II  . Depression   . Sleep apnea, obstructive   . Herpes simplex type 1 infection     vaginal  . Menorrhagia   . Carpal tunnel syndrome, bilateral   . Renal calculi   . Hypokalemia     2/2 diarrhea  . Cholelithiasis   . Renal calculus   . Breast mass   . Anal fissure   . Polycystic ovarian disease   . Essential hypertension    Current Outpatient Prescriptions  Medication Sig Dispense Refill  . acetaminophen (TYLENOL) 500 MG tablet Take 2 tablets (1,000 mg total) by mouth every 8 (eight) hours as needed for pain.    Marland Kitchen. acetaminophen-codeine (TYLENOL #3) 300-30 MG per tablet Take 1 tablet by mouth 2 (two) times daily as needed (coughing). 8 tablet 0  . fluconazole (DIFLUCAN) 150 MG tablet Take 1 tablet (150 mg total) by mouth once. 1 tablet 0  . FLUoxetine (PROZAC) 40 MG capsule Take 1 capsule (40 mg total) by mouth daily. 30 capsule 2  .  glipiZIDE (GLUCOTROL) 10 MG tablet TAKE ONE TABLET BY MOUTH ONCE DAILY WITH MEALS 90 tablet 1  . glucose blood test strip Use as instructed 100 each 12  . insulin aspart protamine- aspart (NOVOLOG MIX 70/30) (70-30) 100 UNIT/ML injection Inject 0.4 mLs (40 Units total) into the skin 2 (two) times daily with a meal. 10 mL 0  . lisinopril (PRINIVIL,ZESTRIL) 10 MG tablet TAKE ONE TABLET BY MOUTH EVERY DAY 90 tablet 0  . metFORMIN (GLUCOPHAGE XR) 500 MG 24 hr tablet Take 2 tablets (1,000 mg total) by mouth daily with breakfast. 60 tablet 5  . pravastatin (PRAVACHOL) 40 MG tablet Take 1 tablet (40 mg total) by mouth every morning. 90 tablet 6  . promethazine-dextromethorphan (PROMETHAZINE-DM) 6.25-15 MG/5ML syrup Take 5 mLs by mouth every 4 (four) hours as needed for cough. Maximum of 6 doses per day. 118 mL 0  . traMADol (ULTRAM) 50 MG tablet Take 1 tablet (50 mg total) by mouth every 12 (twelve) hours as needed. 60 tablet 0   No current facility-administered medications for this visit.    Review of Systems: General: Denies fever, chills, diaphoresis, appetite change and fatigue.  Respiratory: Denies SOB, DOE, cough, and wheezing.   Cardiovascular: Denies chest pain and palpitations.  Gastrointestinal: Denies nausea, vomiting, abdominal pain, and diarrhea.  Genitourinary: Denies dysuria, increased frequency, and flank pain.  Endocrine: Positive for polyuria and polydipsia. Denies hot or cold intolerance, and polydipsia. Musculoskeletal: Denies myalgias, back pain, joint swelling, and gait problem.  Skin: Denies pallor, rash and wounds.  Neurological: Denies dizziness, seizures, syncope, weakness, lightheadedness, numbness and headaches.  Psychiatric/Behavioral: Denies mood changes, and sleep disturbances.  Objective:   Physical Exam: Filed Vitals:   01/10/15 1405  BP: 142/83  Pulse: 86  Temp: 98.6 F (37 C)  TempSrc: Oral  Weight: 245 lb 12.8 oz (111.494 kg)  SpO2: 99%    General:  Obese white female, alert, cooperative, NAD. HEENT: PERRL, EOMI. Moist mucus membranes. Wears glasses.  Neck: Full range of motion without pain, supple, no lymphadenopathy or carotid bruits Lungs: Clear to ascultation bilaterally, normal work of respiration, no wheezes, rales, rhonchi Heart: RRR, no murmurs, gallops, or rubs Abdomen: Soft, non-tender, non-distended, BS + Extremities: No cyanosis, clubbing, or edema Neurologic: Alert & oriented X3, cranial nerves II-XII intact, strength grossly intact, sensation intact to light touch     Assessment & Plan:   Please see problem based assessment and plan.

## 2015-01-11 NOTE — Progress Notes (Signed)
INTERNAL MEDICINE TEACHING ATTENDING ADDENDUM - Windsor Goeken, MD: I reviewed and discussed at the time of visit with the resident Dr. Jones, the patient's medical history, physical examination, diagnosis and results of pertinent tests and treatment and I agree with the patient's care as documented.  

## 2015-01-25 ENCOUNTER — Other Ambulatory Visit: Payer: Self-pay | Admitting: *Deleted

## 2015-01-25 DIAGNOSIS — IMO0002 Reserved for concepts with insufficient information to code with codable children: Secondary | ICD-10-CM

## 2015-01-25 DIAGNOSIS — E1165 Type 2 diabetes mellitus with hyperglycemia: Secondary | ICD-10-CM

## 2015-01-30 MED ORDER — GLUCOSE BLOOD VI STRP: ORAL_STRIP | Status: DC

## 2015-02-13 ENCOUNTER — Other Ambulatory Visit: Payer: Self-pay | Admitting: Internal Medicine

## 2015-04-03 LAB — HM DIABETES EYE EXAM

## 2015-04-11 ENCOUNTER — Encounter: Payer: Self-pay | Admitting: Internal Medicine

## 2015-04-11 ENCOUNTER — Ambulatory Visit (INDEPENDENT_AMBULATORY_CARE_PROVIDER_SITE_OTHER): Payer: BLUE CROSS/BLUE SHIELD | Admitting: Internal Medicine

## 2015-04-11 VITALS — BP 139/72 | HR 89 | Temp 98.9°F | Wt 249.5 lb

## 2015-04-11 DIAGNOSIS — I10 Essential (primary) hypertension: Secondary | ICD-10-CM

## 2015-04-11 DIAGNOSIS — M25562 Pain in left knee: Secondary | ICD-10-CM

## 2015-04-11 DIAGNOSIS — Z794 Long term (current) use of insulin: Secondary | ICD-10-CM | POA: Diagnosis not present

## 2015-04-11 DIAGNOSIS — Z1231 Encounter for screening mammogram for malignant neoplasm of breast: Secondary | ICD-10-CM

## 2015-04-11 DIAGNOSIS — E1165 Type 2 diabetes mellitus with hyperglycemia: Secondary | ICD-10-CM | POA: Diagnosis not present

## 2015-04-11 DIAGNOSIS — IMO0002 Reserved for concepts with insufficient information to code with codable children: Secondary | ICD-10-CM

## 2015-04-11 DIAGNOSIS — Z0000 Encounter for general adult medical examination without abnormal findings: Secondary | ICD-10-CM

## 2015-04-11 LAB — POCT GLYCOSYLATED HEMOGLOBIN (HGB A1C): HEMOGLOBIN A1C: 7.3

## 2015-04-11 LAB — GLUCOSE, CAPILLARY: Glucose-Capillary: 176 mg/dL — ABNORMAL HIGH (ref 70–99)

## 2015-04-11 MED ORDER — METFORMIN HCL ER 500 MG PO TB24: 1000.0000 mg | ORAL_TABLET | Freq: Every day | ORAL | Status: DC

## 2015-04-11 MED ORDER — INSULIN ASPART PROT & ASPART (70-30 MIX) 100 UNIT/ML ~~LOC~~ SUSP: 20.0000 [IU] | Freq: Two times a day (BID) | SUBCUTANEOUS | Status: DC

## 2015-04-11 NOTE — Patient Instructions (Signed)
General Instructions:  1. Please schedule a follow up in 6 weeks.   2. Please take all medications as previously prescribed with the following changes:  STOP taking Glipizide  START using Insulin 70/30 20 units TWICE DAILY, every DAY.   CHECK BLOOD SUGAR TWICE DAILY, always in the AM before breakfast.   If you are having significant LOW blood sugars or very HIGH blood sugars, please call the clinic and let us know.   3. If you have worsening of your symptoms or new symptoms arise, please call the clinic [phone]), or go to the ER immediately if symptoms are severe.  Please bring your medicines with you each time you come to clinic.  Medicines may include prescription medications, over-the-counter medications, herbal remedies, eye drops, vitamins, or other pills.   Progress Toward Treatment Goals:  Treatment Goal 04/11/2015  Hemoglobin A1C improved  Blood pressure at goal    Self Care Goals & Plans:  Self Care Goal 04/11/2015  Manage my medications take my medicines as prescribed; bring my medications to every visit; refill my medications on time  Monitor my health keep track of my blood glucose; bring my glucose meter and log to each visit  Eat healthy foods eat foods that are low in salt; eat baked foods instead of fried foods  Be physically active find an activity I enjoy; take a walk every day  Meeting treatment goals maintain the current self-care plan    Home Blood Glucose Monitoring 04/11/2015  Check my blood sugar 2 times a day  When to check my blood sugar before breakfast; at bedtime     Care Management & Community Referrals:  Referral 04/11/2015  Referrals made for care management support none needed  Referrals made to community resources none

## 2015-04-11 NOTE — Progress Notes (Signed)
Subjective:   Patient ID: Amy Norris female   DOB: April 03, 1966 48 y.o.   MRN: [id number]  HPI: [name] is a 49 y.o. female w/ PMHx of HTN, OSA, DM type II (last HbA1c 9.1 on 07/11/14), GERD, depression, and chronic back pain, presents to the clinic today for a follow-up visit for her DM. Patient was last seen on 01/11/15 regarding her DM type II at which time her HbA1c was found to be 8.8. Today, her HbA1c is 7.3 and the patient admits to begin very compliant w/ her medications, however, says she is not taking her insulin daily. At first, she says she has not ben having symptoms of hypoglycemia, but after further discussion, it seems that she has been having dizziness and lightheadedness during the day, and a few episodes of diaphoresis, tachycardia, and tremulousness at night. She is currently taking Metformin XR 1000 mg daily, Glipizide 10 mg daily, and Insulin 70/30 40 units 3-4 times per week (prescribed as 40 units bid).  The patient also complains of left knee pain that bothers her particularly after a days work. She states the pain is worse at the end of the day and exacerbated by squatting. No morning stiffness.   Past Medical History  Diagnosis Date  . Bronchitis   . Obesity   . GERD (gastroesophageal reflux disease)   . Diabetes mellitus     Type II  . Depression   . Sleep apnea, obstructive   . Herpes simplex type 1 infection     vaginal  . Menorrhagia   . Carpal tunnel syndrome, bilateral   . Renal calculi   . Hypokalemia     2/2 diarrhea  . Cholelithiasis   . Renal calculus   . Breast mass   . Anal fissure   . Polycystic ovarian disease   . Essential hypertension    Current Outpatient Prescriptions  Medication Sig Dispense Refill  . FLUoxetine (PROZAC) 40 MG capsule Take 1 capsule (40 mg total) by mouth daily. 30 capsule 2  . glucose blood test strip Use to check blood sugar 3 to 4 times daily. Diag code E11.65. Insulin dependent 100 each 12  .  insulin aspart protamine- aspart (NOVOLOG MIX 70/30) (70-30) 100 UNIT/ML injection Inject 0.2 mLs (20 Units total) into the skin 2 (two) times daily with a meal. 10 mL 11  . lisinopril (PRINIVIL,ZESTRIL) 10 MG tablet TAKE ONE TABLET BY MOUTH EVERY DAY 90 tablet 0  . metFORMIN (GLUCOPHAGE XR) 500 MG 24 hr tablet Take 2 tablets (1,000 mg total) by mouth daily with breakfast. 60 tablet 5  . pravastatin (PRAVACHOL) 40 MG tablet Take 1 tablet (40 mg total) by mouth every morning. 90 tablet 6   No current facility-administered medications for this visit.    Review of Systems: General: Positive for intermittent diaphoresis. Denies fever, chills, appetite change and fatigue.  Respiratory: Denies SOB, DOE, cough, and wheezing.   Cardiovascular: Denies chest pain and palpitations.  Gastrointestinal: Denies nausea, vomiting, abdominal pain, and diarrhea.  Genitourinary: Denies dysuria, increased frequency, and flank pain. Endocrine: Positive for polyuria and polydipsia. Denies hot or cold intolerance. Musculoskeletal: Denies myalgias, back pain, joint swelling, and gait problem.  Skin: Denies pallor, rash and wounds.  Neurological: Positive for intermittent dizziness and lightheadedness. Denies seizures, syncope, weakness, numbness and headaches.  Psychiatric/Behavioral: Denies mood changes, and sleep disturbances.  Objective:   Physical Exam: Filed Vitals:   04/11/15 1324  BP: 139/72  Pulse: 89  Temp:  98.9 F (37.2 C)  TempSrc: Oral  Weight: 249 lb 8 oz (113.172 kg)  SpO2: 97%    General: Obese white female, alert, cooperative, NAD. HEENT: PERRL, EOMI. Moist mucus membranes. Wears glasses.  Neck: Full range of motion without pain, supple, no lymphadenopathy or carotid bruits Lungs: Clear to ascultation bilaterally, normal work of respiration, no wheezes, rales, rhonchi Heart: RRR, no murmurs, gallops, or rubs Abdomen: Soft, non-tender, non-distended, BS + Extremities: No cyanosis,  clubbing, or edema. Left knee w/out tenderness to palpation. No obvious swelling. No instability noted on special tests.  Neurologic: Alert & oriented X3, cranial nerves II-XII intact, strength grossly intact, sensation intact to light touch   Assessment & Plan:   Please see problem based assessment and plan.

## 2015-04-11 NOTE — Assessment & Plan Note (Signed)
Lab Results  Component Value Date   HGBA1C 7.3 04/11/2015   HGBA1C 8.8 01/10/2015   HGBA1C 9.4 10/04/2014     Assessment: Diabetes control: good control (HgbA1C at goal) Progress toward A1C goal:  improved Comments: Significant improvement in HbA1c, however, I feel this is likely 2/2 hypoglycemic episodes falsely decreasing this value. Her CBG's at home (only about 10 measurements recorded) show an average of 200, no significant lows measured. She does admit to some intermittent dizziness, lightheadedness, and diaphoresis during the day and sometimes at night. She still admits to feeling thirsty and polyuria.   Plan: Medications:  Discontinue Glipizide; Change 70/30 to 20 units bid and instructed her to USE DAILY (not intermittently as before). Continue Metformin XR 1000 mg daily.  Home glucose monitoring: Frequency: 2 times a day Timing: before breakfast, at bedtime Instruction/counseling given: reminded to bring blood glucose meter & log to each visit Educational resources provided: brochure Self management tools provided: copy of home glucose meter download Other plans: Patient to return in 6 weeks. Instructed patient to call clinic for significant highs or lows.

## 2015-04-11 NOTE — Assessment & Plan Note (Signed)
Patient describes classic symptoms of mild OA; pain worse at the end of the day, increased w/ increasing activity. No morning stiffness. No abnormalities on exam, no instability.  -Advised OTC antiinflammatories for now, ice, compression at night.  -Consider XR at next visit if pain worsens.

## 2015-04-11 NOTE — Assessment & Plan Note (Signed)
BP Readings from Last 3 Encounters:  04/11/15 139/72  01/10/15 142/83  01/05/15 139/71    Lab Results  Component Value Date   NA 136 07/11/2014   K 4.2 07/11/2014   CREATININE 0.59 07/11/2014    Assessment: Blood pressure control: controlled Progress toward BP goal:  at goal Comments: BP within acceptable range.   Plan: Medications:  continue current medications; lisinopril 10 mg daily.  Educational resources provided: brochure Self management tools provided: home blood pressure logbook Other plans: RTC in 6 weeks.

## 2015-04-11 NOTE — Progress Notes (Signed)
Internal Medicine Clinic Attending  Case discussed with Dr. Jones at the time of the visit.  We reviewed the resident's history and exam and pertinent patient test results.  I agree with the assessment, diagnosis, and plan of care documented in the resident's note.  

## 2015-04-11 NOTE — Assessment & Plan Note (Signed)
Patient states she went to ophthalmologist in 12/2014. Will obtain records. Patient due for mammogram, will send for this today.

## 2015-04-13 LAB — HM DIABETES EYE EXAM

## 2015-04-17 ENCOUNTER — Encounter: Payer: Self-pay | Admitting: *Deleted

## 2015-05-03 ENCOUNTER — Encounter (HOSPITAL_COMMUNITY): Payer: Self-pay | Admitting: Family Medicine

## 2015-05-03 ENCOUNTER — Emergency Department (HOSPITAL_COMMUNITY)
Admission: EM | Admit: 2015-05-03 | Discharge: 2015-05-03 | Disposition: A | Discharge status: Home / Self Care | Payer: BLUE CROSS/BLUE SHIELD | Attending: Emergency Medicine | Admitting: Emergency Medicine

## 2015-05-03 ENCOUNTER — Emergency Department (HOSPITAL_COMMUNITY): Payer: BLUE CROSS/BLUE SHIELD

## 2015-05-03 DIAGNOSIS — I10 Essential (primary) hypertension: Secondary | ICD-10-CM | POA: Insufficient documentation

## 2015-05-03 DIAGNOSIS — Z87442 Personal history of urinary calculi: Secondary | ICD-10-CM | POA: Diagnosis not present

## 2015-05-03 DIAGNOSIS — Z79899 Other long term (current) drug therapy: Secondary | ICD-10-CM | POA: Insufficient documentation

## 2015-05-03 DIAGNOSIS — E119 Type 2 diabetes mellitus without complications: Secondary | ICD-10-CM | POA: Insufficient documentation

## 2015-05-03 DIAGNOSIS — Z8669 Personal history of other diseases of the nervous system and sense organs: Secondary | ICD-10-CM | POA: Insufficient documentation

## 2015-05-03 DIAGNOSIS — F329 Major depressive disorder, single episode, unspecified: Secondary | ICD-10-CM | POA: Diagnosis not present

## 2015-05-03 DIAGNOSIS — Z8619 Personal history of other infectious and parasitic diseases: Secondary | ICD-10-CM | POA: Diagnosis not present

## 2015-05-03 DIAGNOSIS — Z8742 Personal history of other diseases of the female genital tract: Secondary | ICD-10-CM | POA: Insufficient documentation

## 2015-05-03 DIAGNOSIS — M25562 Pain in left knee: Secondary | ICD-10-CM

## 2015-05-03 DIAGNOSIS — Z794 Long term (current) use of insulin: Secondary | ICD-10-CM | POA: Insufficient documentation

## 2015-05-03 DIAGNOSIS — Z8709 Personal history of other diseases of the respiratory system: Secondary | ICD-10-CM | POA: Diagnosis not present

## 2015-05-03 DIAGNOSIS — E669 Obesity, unspecified: Secondary | ICD-10-CM | POA: Insufficient documentation

## 2015-05-03 DIAGNOSIS — Z8719 Personal history of other diseases of the digestive system: Secondary | ICD-10-CM | POA: Insufficient documentation

## 2015-05-03 DIAGNOSIS — Z87891 Personal history of nicotine dependence: Secondary | ICD-10-CM | POA: Insufficient documentation

## 2015-05-03 MED ORDER — MELOXICAM 7.5 MG PO TABS: 7.5000 mg | ORAL_TABLET | Freq: Every day | ORAL | Status: DC

## 2015-05-03 NOTE — Discharge Instructions (Signed)
Arthritis, Nonspecific °Arthritis is pain, redness, warmth, or puffiness (inflammation) of a joint. The joint may be stiff or hurt when you move it. One or more joints may be affected. There are many types of arthritis. Your doctor may not know what type you have right away. The most common cause of arthritis is wear and tear on the joint (osteoarthritis). °HOME CARE  °· Only take medicine as told by your doctor. °· Rest the joint as much as possible. °· Raise (elevate) your joint if it is puffy. °· Use crutches if the painful joint is in your leg. °· Drink enough fluids to keep your pee (urine) clear or pale yellow. °· Follow your doctor's diet instructions. °· Use cold packs for very bad joint pain for 10 to 15 minutes every hour. Ask your doctor if it is okay for you to use hot packs. °· Exercise as told by your doctor. °· Take a warm shower if you have stiffness in the morning. °· Move your sore joints throughout the day. °GET HELP RIGHT AWAY IF:  °· You have a fever. °· You have very bad joint pain, puffiness, or redness. °· You have many joints that are painful and puffy. °· You are not getting better with treatment. °· You have very bad back pain or leg weakness. °· You cannot control when you poop (bowel movement) or pee (urinate). °· You do not feel better in 24 hours or are getting worse. °· You are having side effects from your medicine. °MAKE SURE YOU:  °· Understand these instructions. °· Will watch your condition. °· Will get help right away if you are not doing well or get worse. °Document Released: 03/12/2010 Document Revised: 06/16/2012 Document Reviewed: 03/12/2010 °[employer]® Patient Information ©2015 [employer]. This information is not intended to replace advice given to you by your health care provider. Make sure you discuss any questions you have with your health care provider. ° °

## 2015-05-03 NOTE — ED Notes (Signed)
pthere for left knee pain for a while. sts hurts on the inside of her knee. sts recently right knee has been hurting. sts also lower back pain has been standing on her feet all day.

## 2015-05-03 NOTE — ED Provider Notes (Signed)
CSN: [id number]     Arrival date & time 05/03/15  2057 History  This chart was scribed for non-physician practitioner working with Bethann BerkshireJoseph Zammit, MD by Richarda Overlieichard Holland, ED Scribe. This patient was seen in room TR08C/TR08C and the patient's care was started at 9:31 PM.   Chief Complaint  Patient presents with  . Knee Pain   The history is provided by the patient. No language interpreter was used.   HPI Comments: Amy Norris is a 49 y.o. female with a history of bronchitis, DM and GERD who presents to the Emergency Department complaining of gradually worsening left knee pain for the last 1.5 months. Pt states that her pain worsened today and states that she was experiencing left medial knee pain. She denies any fall or injuries. Pt rates her pain as a 6/10 at this time. She says that her right knee has recently started hurting because she has been favoring her right leg when she walks. Pt states that she has tried taking motrin, advil and tylenol with no relief. She reports no surgical hx of her left knee. Pt denies numbness or tingling.   Past Medical History  Diagnosis Date  . Bronchitis   . Obesity   . GERD (gastroesophageal reflux disease)   . Diabetes mellitus     Type II  . Depression   . Sleep apnea, obstructive   . Herpes simplex type 1 infection     vaginal  . Menorrhagia   . Carpal tunnel syndrome, bilateral   . Renal calculi   . Hypokalemia     2/2 diarrhea  . Cholelithiasis   . Renal calculus   . Breast mass   . Anal fissure   . Polycystic ovarian disease   . Essential hypertension    Past Surgical History  Procedure Laterality Date  . Tubal ligation  1991    1991  . Cholecystectomy  11/08   Family History  Problem Relation Age of Onset  . Hypertension Mother   . Diabetes Father    History  Substance Use Topics  . Smoking status: Former Smoker    Types: Cigarettes    Quit date: 12/30/1978  . Smokeless tobacco: Not on file  . Alcohol Use: 0.0 oz/week    OB History    No data available     Review of Systems  Musculoskeletal: Positive for back pain and arthralgias.  Neurological: Negative for weakness and numbness.  All other systems reviewed and are negative.     Allergies  Vicodin and Metformin and related  Home Medications   Prior to Admission medications   Medication Sig Start Date End Date Taking? Authorizing Provider  FLUoxetine (PROZAC) 40 MG capsule Take 1 capsule (40 mg total) by mouth daily. 10/04/14   Courtney ParisEden W Jones, MD  glucose blood test strip Use to check blood sugar 3 to 4 times daily. Diag code E11.65. Insulin dependent 01/30/15 11/11/17  Courtney ParisEden W Jones, MD  insulin aspart protamine- aspart (NOVOLOG MIX 70/30) (70-30) 100 UNIT/ML injection Inject 0.2 mLs (20 Units total) into the skin 2 (two) times daily with a meal. 04/11/15   Courtney ParisEden W Jones, MD  lisinopril (PRINIVIL,ZESTRIL) 10 MG tablet TAKE ONE TABLET BY MOUTH EVERY DAY 10/04/14   Courtney ParisEden W Jones, MD  meloxicam (MOBIC) 7.5 MG tablet Take 1 tablet (7.5 mg total) by mouth daily. 05/03/15   Junius FinnerErin O'Malley, PA-C  metFORMIN (GLUCOPHAGE XR) 500 MG 24 hr tablet Take 2 tablets (1,000 mg total) by mouth daily  with breakfast. 04/11/15   Courtney ParisEden W Jones, MD  pravastatin (PRAVACHOL) 40 MG tablet Take 1 tablet (40 mg total) by mouth every morning. 10/04/14 10/04/15  Courtney ParisEden W Jones, MD   BP 122/69 mmHg  Pulse 81  Temp(Src) 98.6 F (37 C) (Oral)  Resp 18  SpO2 99%  LMP 04/28/2015 Physical Exam  Constitutional: She is oriented to person, place, and time. She appears well-developed and well-nourished.  HENT:  Head: Normocephalic and atraumatic.  Eyes: Right eye exhibits no discharge. Left eye exhibits no discharge.  Neck: Neck supple. No tracheal deviation present.  Cardiovascular: Normal rate.   Pulmonary/Chest: No respiratory distress.  Abdominal: She exhibits no distension.  Musculoskeletal: She exhibits tenderness.  Tenderness on medial and lateral joint space lines of left knee. Medial  side worse than lateral. Full ROM of left knee without crepitus. No calf tenderness.   Neurological: She is alert and oriented to person, place, and time.  Skin: Skin is warm and dry.  Skin is intact, no erythema or ecchymosis.   Psychiatric: She has a normal mood and affect. Her behavior is normal.  Nursing note and vitals reviewed.   ED Course  Procedures  DIAGNOSTIC STUDIES: Oxygen Saturation is 99% on RA, normal by my interpretation.    COORDINATION OF CARE: 9:34 PM Discussed treatment plan with pt at bedside and pt agreed to plan.    Labs Review Labs Reviewed - No data to display  Imaging Review Dg Knee Complete 4 Views Left  05/03/2015   CLINICAL DATA:  Left knee pain for 1 month, worsened today. No known injury. Initial encounter.  EXAM: LEFT KNEE - COMPLETE 4+ VIEW  COMPARISON:  None.  FINDINGS: There is no evidence of fracture, dislocation, or joint effusion. There is no evidence of arthropathy or other focal bone abnormality. Soft tissues are unremarkable.  IMPRESSION: Negative exam.   Electronically Signed   By: Drusilla Kannerhomas  Dalessio M.D.   On: 05/03/2015 21:55     EKG Interpretation None      MDM   Final diagnoses:  Left knee pain   Pt c/o Left knee pain, pt believes due to long hours on her feet for work. No recent trauma. No evidence of gout or underlying infection. Not concerned for septic joint.  Plain films: no evidence of arthropathy or other focal bone abnormality.  Rx: mobic. Advised to f/u with her PCP for further evaluation and treatment. May benefit from ortho referral if symptoms continue. Return precautions provided. Pt verbalized understanding and agreement with tx plan.   I personally performed the services described in this documentation, which was scribed in my presence. The recorded information has been reviewed and is accurate.      Junius Finnerrin O'Malley, PA-C 05/03/15 2303  Bethann BerkshireJoseph Zammit, MD 05/04/15 [id number]

## 2015-05-04 ENCOUNTER — Ambulatory Visit (HOSPITAL_COMMUNITY)
Admission: RE | Admit: 2015-05-04 | Discharge: 2015-05-04 | Disposition: A | Discharge status: Home / Self Care | Payer: BLUE CROSS/BLUE SHIELD | Source: Ambulatory Visit | Attending: Internal Medicine | Admitting: Internal Medicine

## 2015-05-04 DIAGNOSIS — Z1231 Encounter for screening mammogram for malignant neoplasm of breast: Secondary | ICD-10-CM | POA: Insufficient documentation

## 2015-05-04 DIAGNOSIS — Z0000 Encounter for general adult medical examination without abnormal findings: Secondary | ICD-10-CM

## 2015-05-22 ENCOUNTER — Telehealth: Payer: Self-pay | Admitting: Internal Medicine

## 2015-05-22 NOTE — Telephone Encounter (Signed)
Call to patient to confirm appointment for 05/23/15 at 2:15 lmtcb. ° °

## 2015-05-23 ENCOUNTER — Encounter: Payer: Self-pay | Admitting: Internal Medicine

## 2015-05-23 ENCOUNTER — Ambulatory Visit (INDEPENDENT_AMBULATORY_CARE_PROVIDER_SITE_OTHER): Payer: BLUE CROSS/BLUE SHIELD | Admitting: Internal Medicine

## 2015-05-23 VITALS — BP 136/68 | HR 86 | Temp 98.2°F | Ht 67.5 in | Wt 255.9 lb

## 2015-05-23 DIAGNOSIS — I10 Essential (primary) hypertension: Secondary | ICD-10-CM

## 2015-05-23 DIAGNOSIS — E1165 Type 2 diabetes mellitus with hyperglycemia: Secondary | ICD-10-CM

## 2015-05-23 DIAGNOSIS — Z9114 Patient's other noncompliance with medication regimen: Secondary | ICD-10-CM | POA: Diagnosis not present

## 2015-05-23 DIAGNOSIS — E780 Pure hypercholesterolemia: Secondary | ICD-10-CM

## 2015-05-23 DIAGNOSIS — Z0000 Encounter for general adult medical examination without abnormal findings: Secondary | ICD-10-CM

## 2015-05-23 DIAGNOSIS — F3340 Major depressive disorder, recurrent, in remission, unspecified: Secondary | ICD-10-CM

## 2015-05-23 DIAGNOSIS — IMO0002 Reserved for concepts with insufficient information to code with codable children: Secondary | ICD-10-CM

## 2015-05-23 DIAGNOSIS — N898 Other specified noninflammatory disorders of vagina: Secondary | ICD-10-CM | POA: Diagnosis not present

## 2015-05-23 DIAGNOSIS — M25562 Pain in left knee: Secondary | ICD-10-CM

## 2015-05-23 LAB — GLUCOSE, CAPILLARY: Glucose-Capillary: 133 mg/dL — ABNORMAL HIGH (ref 65–99)

## 2015-05-23 MED ORDER — INSULIN ASPART PROT & ASPART (70-30 MIX) 100 UNIT/ML ~~LOC~~ SUSP: SUBCUTANEOUS | Status: DC

## 2015-05-23 NOTE — Assessment & Plan Note (Signed)
Mammo negative

## 2015-05-23 NOTE — Assessment & Plan Note (Signed)
Evaluated in ED for continued knee pain, had XR w/ showed no significant abnormalities. States Mobic 7.5 mg helps. -Continue Mobic for now

## 2015-05-23 NOTE — Assessment & Plan Note (Addendum)
Lab Results  Component Value Date   HGBA1C 7.3 04/11/2015   HGBA1C 8.8 01/10/2015   HGBA1C 9.4 10/04/2014     Assessment: Comments: Discontinued Glipizide at last visit, now taking 70/30 20 units bid. Average CBG recently in the AM 180-200 average, slightly increased in the PM (210-220 average). I genuinely feel that blood sugar control is related to poor diet. Patient states she eats candy bars and double cheeseburgers from [employer]. Weight increased 6 lbs since last visit. No hypoglycemic symptoms.   Plan: Medications:  continue current medications; Increase evening Insulin dose to 25 units.  Home glucose monitoring: Instruction/counseling given: discussed diet Educational resources provided: brochure (denies) Other plans: HbA1c at next visit. Will likely be higher than previous given likely falsely elevated value in the setting of previous hypoglycemia.

## 2015-05-23 NOTE — Addendum Note (Signed)
Addended by: Doneen PoissonKLIMA, Tonyetta Berko D on: 05/23/2015 07:47 PM   Modules accepted: Level of Service

## 2015-05-23 NOTE — Assessment & Plan Note (Signed)
BP Readings from Last 3 Encounters:  05/23/15 136/68  05/03/15 122/69  04/11/15 139/72    Lab Results  Component Value Date   NA 136 07/11/2014   K 4.2 07/11/2014   CREATININE 0.59 07/11/2014    Assessment: Comments: Good BP control.   Plan: Medications:  continue current medications Educational resources provided: brochure (denies) Self management tools provided:   Other plans: RTC in 6-8 weeks.

## 2015-05-23 NOTE — Progress Notes (Signed)
Case discussed with Dr. Jones at time of visit.  We reviewed the resident's history and exam and pertinent patient test results.  I agree with the assessment, diagnosis, and plan of care documented in the resident's note. 

## 2015-05-23 NOTE — Progress Notes (Signed)
Subjective:   Patient ID: Amy Norris female   DOB: 1966-10-23 48 y.o.   MRN: [id number]  HPI: Amy Norris is a 49 y.o. female w/ PMHx of HTN, OSA, DM type II (last HbA1c 9.1 on 07/11/14), GERD, depression, and chronic back pain, presents to the clinic today for a follow-up visit for her DM. During her last clinic visit, patient had HbA1c significantly improved to 7.3, however, her recorded sugars were closer to 200 and she admitted to significant hypoglycemic symptoms at night. She has a h/o intermittent compliance w/ her diabetes medications and had started taking glipizde + 70/30 40 units bid + metformin. Given her symptoms, her glipizide was discontinued and insulin dosage decreased. Today, her blood sugars are typically in the 180-200 range in the AM and 200-210 in the PM on 70/30 20 units bid + metformin. She continues to have a very poor diet and eats candy bars and [employer] on a relatively regular basis.   Past Medical History  Diagnosis Date  . Bronchitis   . Obesity   . GERD (gastroesophageal reflux disease)   . Diabetes mellitus     Type II  . Depression   . Sleep apnea, obstructive   . Herpes simplex type 1 infection     vaginal  . Menorrhagia   . Carpal tunnel syndrome, bilateral   . Renal calculi   . Hypokalemia     2/2 diarrhea  . Cholelithiasis   . Renal calculus   . Breast mass   . Anal fissure   . Polycystic ovarian disease   . Essential hypertension    Current Outpatient Prescriptions  Medication Sig Dispense Refill  . FLUoxetine (PROZAC) 40 MG capsule Take 1 capsule (40 mg total) by mouth daily. 30 capsule 2  . glucose blood test strip Use to check blood sugar 3 to 4 times daily. Diag code E11.65. Insulin dependent 100 each 12  . insulin aspart protamine- aspart (NOVOLOG MIX 70/30) (70-30) 100 UNIT/ML injection Use 20 units in AM, 25 units in PM 10 mL 11  . lisinopril (PRINIVIL,ZESTRIL) 10 MG tablet TAKE ONE TABLET BY MOUTH EVERY DAY 90 tablet 0    . meloxicam (MOBIC) 7.5 MG tablet Take 1 tablet (7.5 mg total) by mouth daily. 30 tablet 0  . metFORMIN (GLUCOPHAGE XR) 500 MG 24 hr tablet Take 2 tablets (1,000 mg total) by mouth daily with breakfast. 60 tablet 5  . pravastatin (PRAVACHOL) 40 MG tablet Take 1 tablet (40 mg total) by mouth every morning. 90 tablet 6   No current facility-administered medications for this visit.   Review of Systems  General: Denies fever, diaphoresis, appetite change, and fatigue.  Respiratory: Denies SOB, cough, and wheezing.   Cardiovascular: Denies chest pain and palpitations.  Gastrointestinal: Denies nausea, vomiting, abdominal pain, and diarrhea Musculoskeletal: Positive intermittent knee pain. Denies myalgias, back pain, and gait problem.  Neurological: Denies dizziness, syncope, weakness, lightheadedness, and headaches.  Psychiatric/Behavioral: Denies mood changes, sleep disturbance, and agitation.   Objective:   Physical Exam: Filed Vitals:   05/23/15 1427  BP: 136/68  Pulse: 86  Temp: 98.2 F (36.8 C)  Height: 5' 7.5" (1.715 m)  Weight: 255 lb 14.4 oz (116.075 kg)  SpO2: 99%    General: Obese white female, alert, cooperative, NAD. HEENT: PERRL, EOMI. Moist mucus membranes Neck: Full range of motion without pain, supple, no lymphadenopathy or carotid bruits Lungs: Clear to ascultation bilaterally, normal work of respiration, no wheezes, rales, rhonchi Heart:  RRR, no murmurs, gallops, or rubs Abdomen: Soft, non-tender, non-distended, BS + Extremities: No cyanosis, clubbing, or edema Neurologic: Alert & oriented x3, cranial nerves II-XII intact, strength grossly intact, sensation intact to light touch   Assessment & Plan:   Please see problem based assessment and plan.

## 2015-05-23 NOTE — Patient Instructions (Signed)
General Instructions:  1. Please schedule a follow up in 6-8 weeks.   2. Please take all medications as previously prescribed with the following changes:  Increase PM insulin to 25 units.   3. If you have worsening of your symptoms or new symptoms arise, please call the clinic [phone]), or go to the ER immediately if symptoms are severe.  You have done a great job in taking all your medications. Please continue to do this.   Please bring your medicines with you each time you come to clinic.  Medicines may include prescription medications, over-the-counter medications, herbal remedies, eye drops, vitamins, or other pills.   Progress Toward Treatment Goals:  Treatment Goal 04/11/2015  Hemoglobin A1C improved  Blood pressure at goal    Self Care Goals & Plans:  Self Care Goal 05/23/2015  Manage my medications take my medicines as prescribed; bring my medications to every visit; refill my medications on time  Monitor my health keep track of my blood glucose; bring my glucose meter and log to each visit  Eat healthy foods drink diet soda or water instead of juice or soda; eat more vegetables; eat foods that are low in salt; eat baked foods instead of fried foods; eat fruit for snacks and desserts  Be physically active -  Meeting treatment goals -    Home Blood Glucose Monitoring 04/11/2015  Check my blood sugar 2 times a day  When to check my blood sugar before breakfast; at bedtime     Care Management & Community Referrals:  Referral 04/11/2015  Referrals made for care management support none needed  Referrals made to community resources none

## 2015-08-10 ENCOUNTER — Other Ambulatory Visit: Payer: Self-pay | Admitting: Internal Medicine

## 2015-08-10 ENCOUNTER — Ambulatory Visit (INDEPENDENT_AMBULATORY_CARE_PROVIDER_SITE_OTHER): Payer: Self-pay | Admitting: Internal Medicine

## 2015-08-10 ENCOUNTER — Encounter: Payer: Self-pay | Admitting: Internal Medicine

## 2015-08-10 VITALS — BP 132/69 | HR 83 | Temp 98.5°F | Ht 67.5 in | Wt 256.5 lb

## 2015-08-10 DIAGNOSIS — E1165 Type 2 diabetes mellitus with hyperglycemia: Secondary | ICD-10-CM

## 2015-08-10 DIAGNOSIS — R21 Rash and other nonspecific skin eruption: Secondary | ICD-10-CM | POA: Insufficient documentation

## 2015-08-10 DIAGNOSIS — IMO0002 Reserved for concepts with insufficient information to code with codable children: Secondary | ICD-10-CM

## 2015-08-10 DIAGNOSIS — Z0000 Encounter for general adult medical examination without abnormal findings: Secondary | ICD-10-CM

## 2015-08-10 DIAGNOSIS — I10 Essential (primary) hypertension: Secondary | ICD-10-CM

## 2015-08-10 MED ORDER — TRIAMCINOLONE ACETONIDE 0.1 % EX CREA: 1.0000 "application " | TOPICAL_CREAM | Freq: Four times a day (QID) | CUTANEOUS | Status: DC | PRN

## 2015-08-10 NOTE — Assessment & Plan Note (Signed)
-  needs pap smear, HA1c, lipid panel but no insurance and declined today

## 2015-08-10 NOTE — Assessment & Plan Note (Addendum)
Pt p/w a pruritic rash on her bilateral arms (confined to the upper arm--no forearm involvement) and her neck and now her face b/l.  She was out doing some yard work on Monday and then began itching on Tuesday evening.  She thought it was poison ivy.  She took some benadryl and used calamine lotion which helped somewhat.  Denies any new soaps, foods, meds, detergents, parfumes, etc.  Her husband who was helping her did not develop a rash.  Denies any SOB, wheezing.  She has never had poison ivy before or any rash like this similarly.   She was outside and did have on a sleeveless top so these areas were exposed to the sun.  On exam, I did not appreciate any raised areas on the skin.  No papules or vesicles.  Mainly erythema.  Etiology unclear but likely a contact dermatitis vs PMLE since she was out in the sun.  -gave triamcinalone ($4 list at [facility]) to put on neck and arms -advised to try hydrocortisone OTC on her face for a couple of days (advised her not to put triamcinalone on the face) -could try aveeno oatmeal baths and cont benadryl PRN if that helps with itching -advised to return if rash worsens or develops any other concerning symptoms

## 2015-08-10 NOTE — Assessment & Plan Note (Signed)
Controlled today 132/69. -cont current meds

## 2015-08-10 NOTE — Patient Instructions (Addendum)
Thank you for your visit today.   Please return to the internal medicine clinic in September to see Dr. Yetta Barre.     You may use the cream, triamcinalone to the rash areas.  However, do NOT use on the face.  You may apply hydrocortisone over the counter to your face once daily for 2 days then stop.  You may also use oatmeal bath soaks such as from Aveeno.  You may use benadryl sparingly (no more than the package insert) but this may also make you drowsy.    Return to the clinic if your rash worsens or does not go way with the above treatment.  Please be sure to bring all of your medications with you to every visit; this includes herbal supplements, vitamins, eye drops, and any over-the-counter medications.   Should you have any questions regarding your medications and/or any new or worsening symptoms, please be sure to call the clinic at [phone].   If you believe that you are suffering from a life threatening condition or one that may result in the loss of limb or function, then you should call 911 and proceed to the nearest Emergency Department.   A healthy lifestyle and preventative care can promote health and wellness.   Maintain regular health, dental, and eye exams.  Eat a healthy diet. Foods like vegetables, fruits, whole grains, low-fat dairy products, and lean protein foods contain the nutrients you need without too many calories. Decrease your intake of foods high in solid fats, added sugars, and salt. Get information about a proper diet from your caregiver, if necessary.  Regular physical exercise is one of the most important things you can do for your health. Most adults should get at least 150 minutes of moderate-intensity exercise (any activity that increases your heart rate and causes you to sweat) each week. In addition, most adults need muscle-strengthening exercises on 2 or more days a week.   Maintain a healthy weight. The body mass index (BMI) is a screening tool to  identify possible weight problems. It provides an estimate of body fat based on height and weight. Your caregiver can help determine your BMI, and can help you achieve or maintain a healthy weight. For adults 20 years and older:  A BMI below 18.5 is considered underweight.  A BMI of 18.5 to 24.9 is normal.  A BMI of 25 to 29.9 is considered overweight. A BMI of 30 and above is considered obese.  Rash A rash is a change in the color or feel of your skin. There are many different types of rashes. You may have other problems along with your rash. HOME CARE  Avoid the thing that caused your rash.  Do not scratch your rash.  You may take cools baths to help stop itching.  Only take medicines as told by your doctor.  Keep all doctor visits as told. GET HELP RIGHT AWAY IF:   Your pain, puffiness (swelling), or redness gets worse.  You have a fever.  You have new or severe problems.  You have body aches, watery poop (diarrhea), or you throw up (vomit).  Your rash is not better after 3 days. MAKE SURE YOU:   Understand these instructions.  Will watch your condition.  Will get help right away if you are not doing well or get worse. Document Released: 06/03/2008 Document Revised: 03/09/2012 Document Reviewed: 09/30/2011 [facility] Patient Information 2015 [place], [place]. This information is not intended to replace advice given to you by your  health care provider. Make sure you discuss any questions you have with your health care provider.

## 2015-08-10 NOTE — Progress Notes (Signed)
Patient ID: Amy Norris, female   DOB: [birth year], 49 y.o.   MRN: [id number]     Subjective:   Patient ID: Amy Norris female    DOB: [birth year] 49 y.o.    MRN: [id number] Health Maintenance Due: Health Maintenance Due  Topic Date Due  . HIV Screening  07/09/1981  . PAP SMEAR  02/14/2014  . LIPID PANEL  07/12/2015  . HEMOGLOBIN A1C  07/11/2015  . INFLUENZA VACCINE  07/31/2015    _________________________________________________  HPI: [name] is a 49 y.o. female here for an acute visit for rash.  Pt has a PMH outlined below.  Please see problem-based charting assessment and plan for further details of medical issues addressed at today's visit.  PMH: Past Medical History  Diagnosis Date  . Bronchitis   . Obesity   . GERD (gastroesophageal reflux disease)   . Diabetes mellitus     Type II  . Depression   . Sleep apnea, obstructive   . Herpes simplex type 1 infection     vaginal  . Menorrhagia   . Carpal tunnel syndrome, bilateral   . Renal calculi   . Hypokalemia     2/2 diarrhea  . Cholelithiasis   . Renal calculus   . Breast mass   . Anal fissure   . Polycystic ovarian disease   . Essential hypertension     Medications: Current Outpatient Prescriptions on File Prior to Visit  Medication Sig Dispense Refill  . FLUoxetine (PROZAC) 40 MG capsule Take 1 capsule (40 mg total) by mouth daily. 30 capsule 2  . glucose blood test strip Use to check blood sugar 3 to 4 times daily. Diag code E11.65. Insulin dependent 100 each 12  . insulin aspart protamine- aspart (NOVOLOG MIX 70/30) (70-30) 100 UNIT/ML injection Use 20 units in AM, 25 units in PM 10 mL 11  . lisinopril (PRINIVIL,ZESTRIL) 10 MG tablet TAKE ONE TABLET BY MOUTH EVERY DAY 90 tablet 0  . meloxicam (MOBIC) 7.5 MG tablet Take 1 tablet (7.5 mg total) by mouth daily. 30 tablet 0  . metFORMIN (GLUCOPHAGE XR) 500 MG 24 hr tablet Take 2 tablets (1,000 mg total) by mouth daily with breakfast. 60  tablet 5  . pravastatin (PRAVACHOL) 40 MG tablet Take 1 tablet (40 mg total) by mouth every morning. 90 tablet 6   No current facility-administered medications on file prior to visit.    Allergies: Allergies  Allergen Reactions  . Vicodin [Hydrocodone-Acetaminophen] Itching    Patient states that she can take this medication. Must take with benadryl.  . Metformin And Related Nausea And Vomiting    Patient states that she can take this medication    FH: Family History  Problem Relation Age of Onset  . Hypertension Mother   . Diabetes Father     SH: Social History   Social History  . Marital Status: Married    Spouse Name: N/A  . Number of Children: N/A  . Years of Education: N/A   Occupational History  . [employer]   Social History Main Topics  . Smoking status: Former Smoker    Types: Cigarettes    Quit date: 12/30/1978  . Smokeless tobacco: None  . Alcohol Use: 0.0 oz/week    0 Standard drinks or equivalent per week  . Drug Use: No  . Sexual Activity: Not Asked   Other Topics Concern  . None   Social History Narrative    Smoked many years  ago drinks 1-2 beers on Friday and Saturdays no illegal drug use. Married and lives with her husband and sister-in-law son daughter and grandson.   Financial assistance approved for 100% discount at [employer] and has [facility] card per Rudell Cobb as of August 06, 2010 6:27 PM    Review of Systems: Constitutional: Negative for fever, chills and weight loss.  Eyes: Negative for blurred vision.  Respiratory: Negative for cough and shortness of breath.  Cardiovascular: Negative for chest pain, palpitations and leg swelling.  Gastrointestinal: Negative for nausea, vomiting, abdominal pain, diarrhea, constipation and blood in stool.  Genitourinary: Negative for dysuria, urgency and frequency.  Musculoskeletal: Negative for myalgias and back pain.  Neurological: Negative for dizziness, weakness and headaches.  Skin: +Rash      Objective:   Vital Signs: Filed Vitals:   08/10/15 1453  BP: 132/69  Pulse: 83  Temp: 98.5 F (36.9 C)  TempSrc: Oral  Height: 5' 7.5" (1.715 m)  Weight: 256 lb 8 oz (116.348 kg)  SpO2: 97%      BP Readings from Last 3 Encounters:  08/10/15 132/69  05/23/15 136/68  05/03/15 122/69    Physical Exam: Constitutional: Vital signs reviewed.  Patient is in NAD and cooperative with exam.  Head: Normocephalic and atraumatic.  Facial erythema.   Eyes: EOMI, conjunctivae nl, no scleral icterus.  Neck: Supple. Cardiovascular: RRR, no MRG. Pulmonary/Chest: normal effort, CTAB, no wheezes, rales, or rhonchi. Abdominal: Soft. Obese. NT/ND +BS. Neurological: A&O x3, cranial nerves II-XII are grossly intact, moving all extremities. Extremities: 2+DP b/l; no pitting edema. Skin: Warm, dry and intact. Erythematous rash on the face b/l and the upper arms b/l, no papules or vesicles.     Assessment & Plan:   Assessment and plan was discussed and formulated with my attending.

## 2015-08-10 NOTE — Assessment & Plan Note (Signed)
-  return for HA1c check next month (pt declined today)

## 2015-08-11 ENCOUNTER — Encounter: Payer: Self-pay | Admitting: Internal Medicine

## 2015-08-11 ENCOUNTER — Ambulatory Visit (INDEPENDENT_AMBULATORY_CARE_PROVIDER_SITE_OTHER): Payer: Self-pay | Admitting: Internal Medicine

## 2015-08-11 DIAGNOSIS — R21 Rash and other nonspecific skin eruption: Secondary | ICD-10-CM

## 2015-08-11 MED ORDER — LORATADINE 10 MG PO TABS: 10.0000 mg | ORAL_TABLET | Freq: Every day | ORAL | Status: DC

## 2015-08-11 MED ORDER — RANITIDINE HCL 150 MG PO CAPS: 150.0000 mg | ORAL_CAPSULE | Freq: Every day | ORAL | Status: DC | PRN

## 2015-08-11 MED ORDER — HYDROXYZINE HCL 25 MG PO TABS: 25.0000 mg | ORAL_TABLET | Freq: Three times a day (TID) | ORAL | Status: DC | PRN

## 2015-08-11 NOTE — Progress Notes (Signed)
Called to pharm 

## 2015-08-11 NOTE — Patient Instructions (Addendum)
Thank you for your visit today.   Please return to the internal medicine clinic to see Dr. Yetta Barre at your scheduled appointment.      I have made the following additions/changes to your medications:   I have sent a prescription for claritin to [facility].  Please take this daily until your symptoms improve.   Also, I sent in a prescription for itching called hydroxyzine.  Please take this 3 times daily as needed for itching.  Do not take benadryl with this medication.   Please go to the ED immediately if you have trouble breathing or swallowing or tongue swelling.    Please be sure to bring all of your medications with you to every visit; this includes herbal supplements, vitamins, eye drops, and any over-the-counter medications.   Should you have any questions regarding your medications and/or any new or worsening symptoms, please be sure to call the clinic at [phone].   If you believe that you are suffering from a life threatening condition or one that may result in the loss of limb or function, then you should call 911 and proceed to the nearest Emergency Department.  Hydroxyzine capsules or tablets What is this medicine? HYDROXYZINE (hye DROX i zeen) is an antihistamine. This medicine is used to treat allergy symptoms. It is also used to treat anxiety and tension. This medicine can be used with other medicines to induce sleep before surgery. This medicine may be used for other purposes; ask your health care provider or pharmacist if you have questions. COMMON BRAND NAME(S): ANX, Atarax, Rezine, Vistaril What should I tell my health care provider before I take this medicine? They need to know if you have any of these conditions: -any chronic illness -difficulty passing urine -glaucoma -heart disease -kidney disease -liver disease -lung disease -an unusual or allergic reaction to hydroxyzine, cetirizine, other medicines, foods, dyes, or preservatives -pregnant or trying to get  pregnant -breast-feeding How should I use this medicine? Take this medicine by mouth with a full glass of water. Follow the directions on the prescription label. You may take this medicine with food or on an empty stomach. Take your medicine at regular intervals. Do not take your medicine more often than directed. Talk to your pediatrician regarding the use of this medicine in children. Special care may be needed. While this drug may be prescribed for children as young as 11 years of age for selected conditions, precautions do apply. Patients over 86 years old may have a stronger reaction and need a smaller dose. Overdosage: If you think you have taken too much of this medicine contact a poison control center or emergency room at once. NOTE: This medicine is only for you. Do not share this medicine with others. What if I miss a dose? If you miss a dose, take it as soon as you can. If it is almost time for your next dose, take only that dose. Do not take double or extra doses. What may interact with this medicine? -alcohol -barbiturate medicines for sleep or seizures -medicines for colds, allergies -medicines for depression, anxiety, or emotional disturbances -medicines for pain -medicines for sleep -muscle relaxants This list may not describe all possible interactions. Give your health care provider a list of all the medicines, herbs, non-prescription drugs, or dietary supplements you use. Also tell them if you smoke, drink alcohol, or use illegal drugs. Some items may interact with your medicine. What should I watch for while using this medicine? Tell your doctor or  health care professional if your symptoms do not improve. You may get drowsy or dizzy. Do not drive, use machinery, or do anything that needs mental alertness until you know how this medicine affects you. Do not stand or sit up quickly, especially if you are an older patient. This reduces the risk of dizzy or fainting spells. Alcohol  may interfere with the effect of this medicine. Avoid alcoholic drinks. Your mouth may get dry. Chewing sugarless gum or sucking hard candy, and drinking plenty of water may help. Contact your doctor if the problem does not go away or is severe. This medicine may cause dry eyes and blurred vision. If you wear contact lenses you may feel some discomfort. Lubricating drops may help. See your eye doctor if the problem does not go away or is severe. If you are receiving skin tests for allergies, tell your doctor you are using this medicine. What side effects may I notice from receiving this medicine? Side effects that you should report to your doctor or health care professional as soon as possible: -fast or irregular heartbeat -difficulty passing urine -seizures -slurred speech or confusion -tremor Side effects that usually do not require medical attention (report to your doctor or health care professional if they continue or are bothersome): -constipation -drowsiness -fatigue -headache -stomach upset This list may not describe all possible side effects. Call your doctor for medical advice about side effects. You may report side effects to FDA at 1-800-FDA-1088. Where should I keep my medicine? Keep out of the reach of children. Store at room temperature between 15 and 30 degrees C (59 and 86 degrees F). Keep container tightly closed. Throw away any unused medicine after the expiration date. NOTE: This sheet is a summary. It may not cover all possible information. If you have questions about this medicine, talk to your doctor, pharmacist, or health care provider.  2015, Elsevier/Gold Standard. (2008-04-29 14:50:59)

## 2015-08-11 NOTE — Progress Notes (Signed)
Patient ID: BLESSYN SOMMERVILLE, female   DOB: 10/30/1966, 49 y.o.   MRN: [id number]     Subjective:   Patient ID: ERNESTEEN MIHALIC female    DOB: March 07, 1966 49 y.o.    MRN: [id number] Health Maintenance Due: Health Maintenance Due  Topic Date Due  . HIV Screening  07/09/1981  . PAP SMEAR  02/14/2014  . HEMOGLOBIN A1C  07/11/2015  . LIPID PANEL  07/12/2015  . INFLUENZA VACCINE  07/31/2015    _________________________________________________  HPI: Ms.Ethelyn P Firkus is a 49 y.o. female here for a acute visit for f/u of rash.  Pt has a PMH outlined below.  Please see problem-based charting assessment and plan for further details of medical issues addressed at today's visit.  PMH: Past Medical History  Diagnosis Date  . Bronchitis   . Obesity   . GERD (gastroesophageal reflux disease)   . Diabetes mellitus     Type II  . Depression   . Sleep apnea, obstructive   . Herpes simplex type 1 infection     vaginal  . Menorrhagia   . Carpal tunnel syndrome, bilateral   . Renal calculi   . Hypokalemia     2/2 diarrhea  . Cholelithiasis   . Renal calculus   . Breast mass   . Anal fissure   . Polycystic ovarian disease   . Essential hypertension     Medications: Current Outpatient Prescriptions on File Prior to Visit  Medication Sig Dispense Refill  . FLUoxetine (PROZAC) 40 MG capsule Take 1 capsule (40 mg total) by mouth daily. 30 capsule 2  . glucose blood test strip Use to check blood sugar 3 to 4 times daily. Diag code E11.65. Insulin dependent 100 each 12  . insulin aspart protamine- aspart (NOVOLOG MIX 70/30) (70-30) 100 UNIT/ML injection Use 20 units in AM, 25 units in PM 10 mL 11  . lisinopril (PRINIVIL,ZESTRIL) 10 MG tablet TAKE ONE TABLET BY MOUTH EVERY DAY 90 tablet 0  . meloxicam (MOBIC) 7.5 MG tablet Take 1 tablet (7.5 mg total) by mouth daily. 30 tablet 0  . metFORMIN (GLUCOPHAGE XR) 500 MG 24 hr tablet Take 2 tablets (1,000 mg total) by mouth daily with breakfast.  60 tablet 5  . pravastatin (PRAVACHOL) 40 MG tablet Take 1 tablet (40 mg total) by mouth every morning. 90 tablet 6  . triamcinolone cream (KENALOG) 0.1 % Apply 1 application topically 4 (four) times daily as needed. Do not apply to the face. 15 g 0   No current facility-administered medications on file prior to visit.    Allergies: Allergies  Allergen Reactions  . Vicodin [Hydrocodone-Acetaminophen] Itching    Patient states that she can take this medication. Must take with benadryl.  . Metformin And Related Nausea And Vomiting    Patient states that she can take this medication    FH: Family History  Problem Relation Age of Onset  . Hypertension Mother   . Diabetes Father     SH: Social History   Social History  . Marital Status: Married    Spouse Name: N/A  . Number of Children: N/A  . Years of Education: N/A   Occupational History  . [employer]   Social History Main Topics  . Smoking status: Former Smoker    Types: Cigarettes    Quit date: 12/30/1978  . Smokeless tobacco: Not on file  . Alcohol Use: 0.0 oz/week    0 Standard drinks or equivalent per week  .  Drug Use: No  . Sexual Activity: Not on file   Other Topics Concern  . Not on file   Social History Narrative    Smoked many years ago drinks 1-2 beers on Friday and Saturdays no illegal drug use. Married and lives with her husband and sister-in-law son daughter and grandson.   Financial assistance approved for 100% discount at [facility] and has [facility] card per Rudell Cobb as of August 06, 2010 6:27 PM    Review of Systems: Constitutional: Negative for fever, chills and weight loss.  Eyes: Negative for blurred vision.  Respiratory: Negative for cough and shortness of breath.  Cardiovascular: Negative for chest pain, palpitations and leg swelling.  Gastrointestinal: Negative for nausea, vomiting, abdominal pain, diarrhea, constipation and blood in stool.  Genitourinary: Negative for dysuria, urgency  and frequency.  Musculoskeletal: Negative for myalgias and back pain.  Neurological: Negative for dizziness, weakness and headaches.  Skin: +Rash.    Objective:   Vital Signs: There were no vitals filed for this visit.    BP Readings from Last 3 Encounters:  08/10/15 132/69  05/23/15 136/68  05/03/15 122/69    Physical Exam: Constitutional: Vital signs reviewed.  Patient is in NAD and cooperative with exam.  Head: Normocephalic and atraumatic. Eyes: EOMI, conjunctivae nl, no scleral icterus.  Neck: Supple. Throat: No lip or tongue swelling.   Cardiovascular: RRR, no MRG. Pulmonary/Chest: normal effort, CTAB, no wheezes, rales, or rhonchi. No stridor.  Abdominal: Soft. NT/ND +BS. Neurological: A&O x3, cranial nerves II-XII are grossly intact, moving all extremities. Extremities: 2+DP b/l; no pitting edema. Skin: Warm, dry and intact. Facial erythema, no papules observed.    Assessment & Plan:   Assessment and plan was discussed and formulated with my attending.

## 2015-08-11 NOTE — Progress Notes (Signed)
Called to pharmacy 

## 2015-08-11 NOTE — Assessment & Plan Note (Addendum)
Pt returns today stating her face is swollen since this morning.  She showed me some pictures she had taken and her face did appear somewhat swollen.  Her husband who accompanied her did say that as she moved around the swelling improved.  She states the hydrocortisone has helped her itching on her face and the triamcinalone cream also helped the neck and upper arm itching.  She has also been using benadryl without much relief.  Denies any tongue swelling/lip swelling, dyspnea, difficulty swallowing.  Possibilities include angioedema d/t lisinopril which she has been on for some time so unlikely in the context of no tongue/lip swelling,etc.  Likely allergic reaction to something she came into contact with when she was working outdoors.  Discussed a dose of solumedrol which I think is not appropriate given her DM.  She and her husband would like something stronger than benadryl for itching. -hydroxyzine q3h PRN itching -claritin and ranitidine daily for allergic rx -cont triamcinalone cream PRN until rash clears  -return precautions given to call 911 immediately if tongue/lip swelling, throat closing, difficulty breathing or swallowing, etc.

## 2015-08-14 NOTE — Progress Notes (Signed)
Internal Medicine Clinic Attending  Case discussed with Dr. Gill soon after the resident saw the patient.  We reviewed the resident's history and exam and pertinent patient test results.  I agree with the assessment, diagnosis, and plan of care documented in the resident's note.  

## 2015-08-15 ENCOUNTER — Encounter: Payer: Self-pay | Admitting: *Deleted

## 2015-09-05 ENCOUNTER — Encounter: Payer: Self-pay | Admitting: Internal Medicine

## 2015-09-19 ENCOUNTER — Encounter: Payer: Self-pay | Admitting: Internal Medicine

## 2015-09-19 ENCOUNTER — Ambulatory Visit (INDEPENDENT_AMBULATORY_CARE_PROVIDER_SITE_OTHER): Payer: Self-pay | Admitting: Internal Medicine

## 2015-09-19 VITALS — BP 127/63 | HR 78 | Temp 98.7°F | Ht 67.5 in | Wt 251.6 lb

## 2015-09-19 DIAGNOSIS — IMO0002 Reserved for concepts with insufficient information to code with codable children: Secondary | ICD-10-CM

## 2015-09-19 DIAGNOSIS — E1165 Type 2 diabetes mellitus with hyperglycemia: Secondary | ICD-10-CM

## 2015-09-19 DIAGNOSIS — Z794 Long term (current) use of insulin: Secondary | ICD-10-CM

## 2015-09-19 DIAGNOSIS — Z23 Encounter for immunization: Secondary | ICD-10-CM

## 2015-09-19 DIAGNOSIS — Z0000 Encounter for general adult medical examination without abnormal findings: Secondary | ICD-10-CM

## 2015-09-19 DIAGNOSIS — E7800 Pure hypercholesterolemia, unspecified: Secondary | ICD-10-CM

## 2015-09-19 LAB — POCT GLYCOSYLATED HEMOGLOBIN (HGB A1C): HEMOGLOBIN A1C: 8

## 2015-09-19 LAB — GLUCOSE, CAPILLARY: GLUCOSE-CAPILLARY: 293 mg/dL — AB (ref 65–99)

## 2015-09-19 MED ORDER — PRAVASTATIN SODIUM 40 MG PO TABS: 40.0000 mg | ORAL_TABLET | Freq: Every morning | ORAL | Status: DC

## 2015-09-19 NOTE — Progress Notes (Signed)
Subjective:   Patient ID: Amy Norris female   DOB: 19-Jan-1966 49 y.o.   MRN: [id number]  HPI: [name] is a 49 y.o. female w/ PMHx of HTN, OSA, DM type II (last HbA1c 9.1 on 07/11/14), GERD, depression, and chronic back pain, presents to the clinic today for a follow-up visit for her DM. Patient has very few complaints today but does state she has not been able to pick up her insulin recently because she cannot afford it until she gets her paycheck on Friday. She also states that her meter does not have a battery and she is planning on replacing it this week. Last time she was seen in the clinic, she had an HbA1c that was 7.6, however, I believe this was falsely low in the setting of hypoglycemia. Today it is 8.0, therefore more in a reasonable range and think this better represents her glucose control. The patient has lost a few pounds since her last visit. She claims she is trying to eat healthy. No recent symptoms of hypoglycemia.  The patient also claims she has had some right LE swelling related to a long days work, especially after standing for an extensive period of time. She says it is sometime associated w/ pain. She claims the pain and swelling goes away if she keeps it elevated at the end of the day. She denies any associated redness. No SOB, previous h/o DVT or PE, no family history of clotting disorders.    Current Outpatient Prescriptions  Medication Sig Dispense Refill  . FLUoxetine (PROZAC) 40 MG capsule TAKE ONE CAPSULE BY MOUTH ONCE DAILY 30 capsule 5  . glucose blood test strip Use to check blood sugar 3 to 4 times daily. Diag code E11.65. Insulin dependent 100 each 12  . hydrOXYzine (ATARAX/VISTARIL) 25 MG tablet Take 1 tablet (25 mg total) by mouth 3 (three) times daily as needed for itching. Do not take with benadryl. 30 tablet 0  . insulin aspart protamine- aspart (NOVOLOG MIX 70/30) (70-30) 100 UNIT/ML injection Use 20 units in AM, 25 units in PM 10 mL 11  .  lisinopril (PRINIVIL,ZESTRIL) 10 MG tablet TAKE ONE TABLET BY MOUTH EVERY DAY 90 tablet 0  . loratadine (CLARITIN) 10 MG tablet Take 1 tablet (10 mg total) by mouth daily. 30 tablet 1  . meloxicam (MOBIC) 7.5 MG tablet Take 1 tablet (7.5 mg total) by mouth daily. 30 tablet 0  . metFORMIN (GLUCOPHAGE XR) 500 MG 24 hr tablet Take 2 tablets (1,000 mg total) by mouth daily with breakfast. 60 tablet 5  . pravastatin (PRAVACHOL) 40 MG tablet Take 1 tablet (40 mg total) by mouth every morning. 30 tablet 5  . ranitidine (ZANTAC) 150 MG capsule Take 1 capsule (150 mg total) by mouth daily as needed. 30 capsule 0  . triamcinolone cream (KENALOG) 0.1 % Apply 1 application topically 4 (four) times daily as needed. Do not apply to the face. 15 g 0   No current facility-administered medications for this visit.   Review of Systems  General: Denies fever, diaphoresis, appetite change, and fatigue.  Respiratory: Denies SOB, cough, and wheezing.   Cardiovascular: Denies chest pain and palpitations.  Gastrointestinal: Denies nausea, vomiting, abdominal pain, and diarrhea Musculoskeletal: Positive for right LE pain/swelling. Denies back pain, and gait problem.  Neurological: Denies dizziness, syncope, weakness, lightheadedness, and headaches.  Psychiatric/Behavioral: Denies mood changes, sleep disturbance, and agitation.    Objective:   Physical Exam: Filed Vitals:   09/19/15  1434  BP: 127/63  Pulse: 78  Temp: 98.7 F (37.1 C)  TempSrc: Oral  Height: 5' 7.5" (1.715 m)  Weight: 251 lb 9.6 oz (114.125 kg)  SpO2: 100%    General: Obese white female, alert, cooperative, NAD. HEENT: PERRL, EOMI. Moist mucus membranes Neck: Full range of motion without pain, supple, no lymphadenopathy or carotid bruits Lungs: Clear to ascultation bilaterally, normal work of respiration, no wheezes, rales, rhonchi Heart: RRR, no murmurs, gallops, or rubs Abdomen: Soft, non-tender, non-distended, BS + Extremities: No  cyanosis, clubbing, or edema. RLE unremarkable.  Neurologic: Alert & oriented x3, cranial nerves II-XII intact, strength grossly intact, sensation intact to light touch   Assessment & Plan:   Please see problem based assessment and plan.

## 2015-09-19 NOTE — Patient Instructions (Signed)
1. Please schedule a follow up appointment for 3 months.   2. Please take all medications as previously prescribed with the following changes:  Continue Novolog 70/30: 20 units in the AM, 25 units in the PM. Do not take extra Metformin, it will not add any benefit.  3. If you have worsening of your symptoms or new symptoms arise, please call the clinic ([phone]), or go to the ER immediately if symptoms are severe.

## 2015-09-20 DIAGNOSIS — Z23 Encounter for immunization: Secondary | ICD-10-CM | POA: Insufficient documentation

## 2015-09-20 NOTE — Assessment & Plan Note (Signed)
Patient states she is not taking her Pravachol.  -Refilled this and reinforced importance of statin therapy. Could probably benefit from increase to high intensity in the future.

## 2015-09-20 NOTE — Assessment & Plan Note (Signed)
Flu shot given

## 2015-09-20 NOTE — Assessment & Plan Note (Signed)
Lab Results  Component Value Date   HGBA1C 8.0 09/19/2015   HGBA1C 7.3 04/11/2015   HGBA1C 8.8 01/10/2015     Assessment: Diabetes control:  Poorly controlled, yet still better than in the past. Previous 7.6 I feel is related to hypoglycemia.  Progress toward A1C goal:   Improving Comments: Patient has not been able to afford her insulin recently, says she will pick it up on Friday. Did not bring her meter as she states she needs a new battery, another purchase she will make this week. Other than the very recent past, I think her DM control is somewhat improving. Have simplified her regimen and can titrate up her 70/30 when she brings her meter and is taking her insulin consistently.   Plan: Medications:  continue current medications; Metformin XR 1000 mg once daily. Insulin 70/30 20 units in the AM, 25 units in the PM.  Home glucose monitoring: Frequency:  Once Timing:  qAM Instruction/counseling given: reminded to bring blood glucose meter & log to each visit Other plans: can readjust 70/30 at next visit. Patient says she will be compliant as soon as she can pick up her Rx on Friday.

## 2015-09-21 NOTE — Progress Notes (Signed)
Internal Medicine Clinic Attending  Case discussed with Dr. Jones soon after the resident saw the patient.  We reviewed the resident's history and exam and pertinent patient test results.  I agree with the assessment, diagnosis, and plan of care documented in the resident's note. 

## 2015-09-30 ENCOUNTER — Other Ambulatory Visit: Payer: Self-pay | Admitting: Internal Medicine

## 2015-10-04 ENCOUNTER — Other Ambulatory Visit: Payer: Self-pay | Admitting: Internal Medicine

## 2015-10-25 ENCOUNTER — Other Ambulatory Visit: Payer: Self-pay | Admitting: Internal Medicine

## 2015-12-19 ENCOUNTER — Encounter: Payer: Self-pay | Admitting: Internal Medicine

## 2015-12-26 ENCOUNTER — Ambulatory Visit: Payer: Self-pay | Admitting: Internal Medicine

## 2016-01-31 ENCOUNTER — Ambulatory Visit: Payer: Self-pay | Admitting: Internal Medicine

## 2016-02-01 ENCOUNTER — Telehealth: Payer: Self-pay | Admitting: Dietician

## 2016-02-01 NOTE — Telephone Encounter (Signed)
CDE called patient to assist with decreasing her A1C: patient's diabetes distress is low, she reports having all the medicine and supplies she needs and is not having problem obtaining it. She self increased her insulin that she buys at [facility]  to 30 units twice a day and reports better blood sugars with no lows. CDE commended patient for her diabetes self management and reminded her to be prepared for her foot exam and a1C at her next visit. CDE confirmed that she is uninsured.

## 2016-02-02 ENCOUNTER — Ambulatory Visit: Payer: Self-pay | Admitting: Internal Medicine

## 2016-02-05 ENCOUNTER — Telehealth: Payer: Self-pay | Admitting: Internal Medicine

## 2016-02-05 NOTE — Telephone Encounter (Signed)
REMINDER CALL FOR APPT/LMTCB IF SHE NEEDS TO CANCEL °

## 2016-02-06 ENCOUNTER — Ambulatory Visit: Payer: Self-pay | Admitting: Internal Medicine

## 2016-02-14 ENCOUNTER — Ambulatory Visit: Payer: Self-pay | Admitting: Internal Medicine

## 2016-02-19 ENCOUNTER — Telehealth: Payer: Self-pay | Admitting: Internal Medicine

## 2016-02-19 NOTE — Telephone Encounter (Signed)
APPT REMINDER CALL, LMTCB IF SHE NEEDS TO CANCEL °

## 2016-02-20 ENCOUNTER — Encounter: Payer: Self-pay | Admitting: Internal Medicine

## 2016-02-20 ENCOUNTER — Ambulatory Visit (INDEPENDENT_AMBULATORY_CARE_PROVIDER_SITE_OTHER): Payer: Self-pay | Admitting: Internal Medicine

## 2016-02-20 VITALS — BP 134/61 | HR 73 | Temp 98.2°F | Ht 67.5 in | Wt 245.1 lb

## 2016-02-20 DIAGNOSIS — IMO0001 Reserved for inherently not codable concepts without codable children: Secondary | ICD-10-CM

## 2016-02-20 DIAGNOSIS — Z794 Long term (current) use of insulin: Secondary | ICD-10-CM

## 2016-02-20 DIAGNOSIS — I10 Essential (primary) hypertension: Secondary | ICD-10-CM

## 2016-02-20 DIAGNOSIS — Z7984 Long term (current) use of oral hypoglycemic drugs: Secondary | ICD-10-CM

## 2016-02-20 DIAGNOSIS — Z0000 Encounter for general adult medical examination without abnormal findings: Secondary | ICD-10-CM

## 2016-02-20 DIAGNOSIS — E7800 Pure hypercholesterolemia, unspecified: Secondary | ICD-10-CM

## 2016-02-20 DIAGNOSIS — F3340 Major depressive disorder, recurrent, in remission, unspecified: Secondary | ICD-10-CM

## 2016-02-20 DIAGNOSIS — E1165 Type 2 diabetes mellitus with hyperglycemia: Principal | ICD-10-CM

## 2016-02-20 DIAGNOSIS — E1142 Type 2 diabetes mellitus with diabetic polyneuropathy: Secondary | ICD-10-CM

## 2016-02-20 DIAGNOSIS — F339 Major depressive disorder, recurrent, unspecified: Secondary | ICD-10-CM

## 2016-02-20 LAB — POCT GLYCOSYLATED HEMOGLOBIN (HGB A1C): Hemoglobin A1C: 7.2

## 2016-02-20 LAB — GLUCOSE, CAPILLARY: Glucose-Capillary: 180 mg/dL — ABNORMAL HIGH (ref 65–99)

## 2016-02-20 MED ORDER — FLUOXETINE HCL 40 MG PO CAPS: 80.0000 mg | ORAL_CAPSULE | Freq: Every day | ORAL | Status: DC

## 2016-02-20 MED ORDER — GABAPENTIN 300 MG PO CAPS: 300.0000 mg | ORAL_CAPSULE | Freq: Every day | ORAL | Status: DC

## 2016-02-20 MED ORDER — PRAVASTATIN SODIUM 40 MG PO TABS: 40.0000 mg | ORAL_TABLET | Freq: Every morning | ORAL | Status: DC

## 2016-02-20 MED ORDER — LISINOPRIL 10 MG PO TABS: ORAL_TABLET | ORAL | Status: DC

## 2016-02-20 MED ORDER — INSULIN PEN NEEDLE 32G X 6 MM MISC: Status: DC

## 2016-02-20 MED ORDER — INSULIN ISOPHANE & REGULAR (HUMAN 70-30)100 UNIT/ML KWIKPEN: 30.0000 [IU] | PEN_INJECTOR | Freq: Two times a day (BID) | SUBCUTANEOUS | Status: DC

## 2016-02-20 MED ORDER — METFORMIN HCL ER 500 MG PO TB24: 1000.0000 mg | ORAL_TABLET | Freq: Every day | ORAL | Status: DC

## 2016-02-20 NOTE — Patient Instructions (Signed)
1. Please make a follow up for 3 months.   If you have problems with refills, insulin, Neurontin, or Prozac prescriptions, please let me know.   2. Please take all medications as previously prescribed with the following changes:  Increase Prozac to 80 mg daily (2 tablets).  Start taking Neurontin 300 mg once at night for burning in your legs.   [facility] for [facility] medication assistance program.   I have sent all of your other refills to [facility] on [address].   3. If you have worsening of your symptoms or new symptoms arise, please call the clinic ([phone]), or go to the ER immediately if symptoms are severe.  You have done a great job in taking all your medications. Please continue to do this.

## 2016-02-20 NOTE — Progress Notes (Signed)
Subjective:   Patient ID: Amy Norris female   DOB: 1966-04-03 50 y.o.   MRN: [id number]  HPI: Amy Norris is a 50 y.o. female w/ PMHx of HTN, OSA, DM type II (last HbA1c 9.1 on 07/11/14), GERD, depression, and chronic back pain, presents to the clinic today for a follow-up visit for her DM. It seems her DM has been better controlled lately, she has been using her insulin regularly and checking her blood sugars 2-3 times daily. Since her blood sugars were slightly elevated a while back, she increased her own insulin dose to 30 units bid instead of 20-25 as previously using. She has not had any symptoms of hypoglycemia, although she does state she had one blood sugar that was in the 70's a few weeks ago. She is complaining of symptoms of neuropathy in her feet that she describes as a burning sensation, worse typically at night, involving both feet. She also has some mild back pain but does not describe any symptoms of weakness, saddle anesthesia, or incontinence.   Her other complaint is that of worsening depression. She has been having issues with her grand son lately as well as increased feelings of sadness, decreased energy, and anhedonia,  and for the past few months, she feels like her Prozac is not helping. No suicidal ideations.    Current Outpatient Prescriptions  Medication Sig Dispense Refill  . benzonatate (TESSALON) 100 MG capsule TAKE ONE CAPSULE BY MOUTH THREE TIMES DAILY AS NEEDED FOR COUGH 30 capsule 0  . FLUoxetine (PROZAC) 40 MG capsule Take 2 capsules (80 mg total) by mouth daily. 60 capsule 5  . gabapentin (NEURONTIN) 300 MG capsule Take 1 capsule (300 mg total) by mouth at bedtime. 30 capsule 5  . glucose blood test strip Use to check blood sugar 3 to 4 times daily. Diag code E11.65. Insulin dependent 100 each 12  . hydrOXYzine (ATARAX/VISTARIL) 25 MG tablet Take 1 tablet (25 mg total) by mouth 3 (three) times daily as needed for itching. Do not take with benadryl.  30 tablet 0  . Insulin Isophane & Regular Human (HUMULIN 70/30 KWIKPEN) (70-30) 100 UNIT/ML PEN Inject 30 Units into the skin 2 (two) times daily with a meal. 15 mL 11  . Insulin Pen Needle 32G X 6 MM MISC Please use to inject insulin twice daily. ICD 10: E11.65 100 each 11  . lisinopril (PRINIVIL,ZESTRIL) 10 MG tablet TAKE ONE TABLET BY MOUTH EVERY DAY 90 tablet 1  . loratadine (CLARITIN) 10 MG tablet Take 1 tablet (10 mg total) by mouth daily. 30 tablet 1  . meloxicam (MOBIC) 7.5 MG tablet Take 1 tablet (7.5 mg total) by mouth daily. 30 tablet 0  . metFORMIN (GLUCOPHAGE XR) 500 MG 24 hr tablet Take 2 tablets (1,000 mg total) by mouth daily with breakfast. 180 tablet 1  . pravastatin (PRAVACHOL) 40 MG tablet Take 1 tablet (40 mg total) by mouth every morning. 90 tablet 1  . ranitidine (ZANTAC) 150 MG capsule Take 1 capsule (150 mg total) by mouth daily as needed. 30 capsule 0  . triamcinolone cream (KENALOG) 0.1 % Apply 1 application topically 4 (four) times daily as needed. Do not apply to the face. 15 g 0   No current facility-administered medications for this visit.   Review of Systems  General: Denies fever, diaphoresis, appetite change, and fatigue.  Respiratory: Denies SOB, cough, and wheezing.   Cardiovascular: Denies chest pain and palpitations.  Gastrointestinal: Denies nausea, vomiting,  abdominal pain, and diarrhea Musculoskeletal: Positive for back pain and foot pain. Denies gait problem.  Neurological: Denies dizziness, syncope, weakness, lightheadedness, and headaches.  Psychiatric/Behavioral: Positive for depression. Denies sleep disturbance, and agitation.   Objective:   Physical Exam: Filed Vitals:   02/20/16 1447  BP: 134/61  Pulse: 73  Temp: 98.2 F (36.8 C)  TempSrc: Oral  Height: 5' 7.5" (1.715 m)  Weight: 245 lb 1.6 oz (111.177 kg)  SpO2: 98%    General: Obese white female, alert, cooperative, NAD. HEENT: PERRL, EOMI. Moist mucus membranes Neck: Full  range of motion without pain, supple, no lymphadenopathy or carotid bruits Lungs: Clear to ascultation bilaterally, normal work of respiration, no wheezes, rales, rhonchi Heart: RRR, no murmurs, gallops, or rubs Abdomen: Soft, non-tender, non-distended, BS + Extremities: No cyanosis, clubbing, or edema.  Neurologic: Alert & oriented x3, cranial nerves II-XII intact, strength grossly intact, sensation intact to light touch   Assessment & Plan:   Please see problem based assessment and plan.

## 2016-02-21 LAB — LIPID PANEL
CHOL/HDL RATIO: 3 ratio (ref 0.0–4.4)
Cholesterol, Total: 178 mg/dL (ref 100–199)
HDL: 60 mg/dL (ref >39)
LDL Calculated: 92 mg/dL (ref 0–99)
TRIGLYCERIDES: 129 mg/dL (ref 0–149)
VLDL Cholesterol Cal: 26 mg/dL (ref 5–40)

## 2016-02-21 LAB — BMP8+ANION GAP
ANION GAP: 16 mmol/L (ref 10.0–18.0)
BUN/Creatinine Ratio: 16 (ref 9–23)
BUN: 10 mg/dL (ref 6–24)
CALCIUM: 9.7 mg/dL (ref 8.7–10.2)
CHLORIDE: 99 mmol/L (ref 96–106)
CO2: 22 mmol/L (ref 18–29)
Creatinine, Ser: 0.62 mg/dL (ref 0.57–1.00)
GFR calc Af Amer: 122 mL/min/{1.73_m2} (ref >59)
GFR, EST NON AFRICAN AMERICAN: 106 mL/min/{1.73_m2} (ref >59)
GLUCOSE: 180 mg/dL — AB (ref 65–99)
POTASSIUM: 3.9 mmol/L (ref 3.5–5.2)
SODIUM: 137 mmol/L (ref 134–144)

## 2016-02-21 LAB — HIV ANTIBODY (ROUTINE TESTING W REFLEX): HIV SCREEN 4TH GENERATION: NONREACTIVE

## 2016-02-21 NOTE — Assessment & Plan Note (Signed)
Patient seems to have recent worsening in depression symptoms, experiencing decreased energy and anhedonia. No suicidal ideations. Feel like her Prozac is not working. No adverse effects related to this medication in the past.  -Increase Proxac to 80 mg daily. If this does not work, can consider changing agents.  -RTC in 3 months. Discussed reasons to come back sooner.

## 2016-02-21 NOTE — Assessment & Plan Note (Signed)
Lab Results  Component Value Date   HGBA1C 7.2 02/20/2016   HGBA1C 8.0 09/19/2015   HGBA1C 7.3 04/11/2015     Assessment: Diabetes control:  Improved.  Comments: Patient is using her insulin regularly, no recent hypoglycemia, feels better physically because her blood sugars are well controlled. Using Metformin 1000 mg daily + Insulin 70/30 30 units bid.   Plan: Medications:  continue current medications; Sent in Rx for Humulin Kwikpen at [facility] as I think this will help with her continued compliance as well.  Home glucose monitoring: Frequency:  tid Instruction/counseling given: reminded to bring blood glucose meter & log to each visit, reminded to bring medications to each visit, discussed foot care, discussed the need for weight loss and discussed diet Other plans: Change to pens as above, BMP looks okay, start Neurontin 300 mg qhs for polyneuropathy. RTC in 3 months.

## 2016-02-21 NOTE — Assessment & Plan Note (Signed)
Foot exam today, HIV negative, lipid panel, BMP.

## 2016-02-21 NOTE — Assessment & Plan Note (Signed)
Checked lipid panel today. LDL < 100.  -Refilled Pravachol.

## 2016-02-21 NOTE — Assessment & Plan Note (Signed)
BP Readings from Last 3 Encounters:  02/20/16 134/61  09/19/15 127/63  08/10/15 132/69    Lab Results  Component Value Date   NA 137 02/20/2016   K 3.9 02/20/2016   CREATININE 0.62 02/20/2016    Assessment: Blood pressure control:  Well controlled Progress toward BP goal:   At goal Comments: Taking Lisinopril 10 mg daily. Has missed a few doses lately because she was running out.   Plan: Medications:  continue current medications; Lisinopril 10 mg daily.  Other plans: BMP looks great. RTC in 3 months

## 2016-02-22 NOTE — Progress Notes (Signed)
Internal Medicine Clinic Attending  Case discussed with Dr. Jones at the time of the visit.  We reviewed the resident's history and exam and pertinent patient test results.  I agree with the assessment, diagnosis, and plan of care documented in the resident's note.  

## 2016-03-05 ENCOUNTER — Encounter: Payer: Self-pay | Admitting: Internal Medicine

## 2016-03-31 ENCOUNTER — Other Ambulatory Visit: Payer: Self-pay | Admitting: Internal Medicine

## 2016-05-06 ENCOUNTER — Telehealth: Payer: Self-pay | Admitting: Internal Medicine

## 2016-05-06 NOTE — Telephone Encounter (Signed)
APT. REMINDER CALL, LMTCB °

## 2016-05-07 ENCOUNTER — Ambulatory Visit (INDEPENDENT_AMBULATORY_CARE_PROVIDER_SITE_OTHER): Payer: Self-pay | Admitting: Internal Medicine

## 2016-05-07 ENCOUNTER — Encounter: Payer: Self-pay | Admitting: Internal Medicine

## 2016-05-07 VITALS — BP 126/69 | HR 71 | Temp 97.9°F | Ht 67.5 in | Wt 243.1 lb

## 2016-05-07 DIAGNOSIS — Z9114 Patient's other noncompliance with medication regimen: Secondary | ICD-10-CM

## 2016-05-07 DIAGNOSIS — Z0000 Encounter for general adult medical examination without abnormal findings: Secondary | ICD-10-CM

## 2016-05-07 DIAGNOSIS — F3340 Major depressive disorder, recurrent, in remission, unspecified: Secondary | ICD-10-CM

## 2016-05-07 DIAGNOSIS — E7800 Pure hypercholesterolemia, unspecified: Secondary | ICD-10-CM

## 2016-05-07 DIAGNOSIS — I10 Essential (primary) hypertension: Secondary | ICD-10-CM

## 2016-05-07 DIAGNOSIS — IMO0001 Reserved for inherently not codable concepts without codable children: Secondary | ICD-10-CM

## 2016-05-07 DIAGNOSIS — Z794 Long term (current) use of insulin: Secondary | ICD-10-CM

## 2016-05-07 DIAGNOSIS — E1165 Type 2 diabetes mellitus with hyperglycemia: Secondary | ICD-10-CM

## 2016-05-07 LAB — GLUCOSE, CAPILLARY: Glucose-Capillary: 185 mg/dL — ABNORMAL HIGH (ref 65–99)

## 2016-05-07 LAB — POCT GLYCOSYLATED HEMOGLOBIN (HGB A1C): Hemoglobin A1C: 7.6

## 2016-05-07 MED ORDER — GABAPENTIN 300 MG PO CAPS: 300.0000 mg | ORAL_CAPSULE | Freq: Every day | ORAL | Status: DC

## 2016-05-07 MED ORDER — INSULIN ISOPHANE & REGULAR (HUMAN 70-30)100 UNIT/ML KWIKPEN: 30.0000 [IU] | PEN_INJECTOR | Freq: Two times a day (BID) | SUBCUTANEOUS | Status: DC

## 2016-05-07 MED ORDER — PRAVASTATIN SODIUM 40 MG PO TABS: 40.0000 mg | ORAL_TABLET | Freq: Every morning | ORAL | Status: DC

## 2016-05-07 MED ORDER — LISINOPRIL 10 MG PO TABS: ORAL_TABLET | ORAL | Status: DC

## 2016-05-07 MED ORDER — INSULIN PEN NEEDLE 32G X 6 MM MISC: Status: DC

## 2016-05-07 NOTE — Patient Instructions (Signed)
1. Please make a follow up appointment for 6 weeks.   2. Please take all medications as previously prescribed with the following changes:  START TAKING YOUR INSULIN.   Start taking Neurontin (gabapentin) 300 mg once at bedtime for your nerve pain in your legs.   EAT HEALTHY FOOD AND EXERCISE.   CONGRATULATIONS ON YOUR NEW JOB!!!!!  3. If you have worsening of your symptoms or new symptoms arise, please call the clinic [phone]), or go to the ER immediately if symptoms are severe.

## 2016-05-07 NOTE — Progress Notes (Signed)
Subjective:   Patient ID: Amy Norris female   DOB: 09/20/1966 50 y.o.   MRN: [id number]  HPI: [name] is a 50 y.o. female w/ PMHx of HTN, OSA, DM type II, GERD, depression, and chronic back pain, presents to the clinic today for a follow-up visit for her DM and depression. Says she is doing quite well lately, recently changed positions at work and says she is liking her job a lot more. Feels her recent increase in her depression medications has helped quite a bit with her depression. She has not been using her insulin as previously discussed and has actually not picked her her newly prescribed insulin pens since her last appointment. Says she is using her friend's 70/30 pens instead that her friend no longer needs. She does admit however, that she has not used any insulin in the past 2 weeks. Her repeat HbA1c is 7.6 today, increased from 7.2. She otherwise seems to be doing quite well.    Current Outpatient Prescriptions  Medication Sig Dispense Refill  . benzonatate (TESSALON) 100 MG capsule TAKE ONE CAPSULE BY MOUTH THREE TIMES DAILY AS NEEDED FOR COUGH 30 capsule 1  . FLUoxetine (PROZAC) 40 MG capsule Take 2 capsules (80 mg total) by mouth daily. 60 capsule 5  . gabapentin (NEURONTIN) 300 MG capsule Take 1 capsule (300 mg total) by mouth at bedtime. 30 capsule 5  . glucose blood test strip Use to check blood sugar 3 to 4 times daily. Diag code E11.65. Insulin dependent 100 each 12  . hydrOXYzine (ATARAX/VISTARIL) 25 MG tablet Take 1 tablet (25 mg total) by mouth 3 (three) times daily as needed for itching. Do not take with benadryl. 30 tablet 0  . Insulin Isophane & Regular Human (HUMULIN 70/30 KWIKPEN) (70-30) 100 UNIT/ML PEN Inject 30 Units into the skin 2 (two) times daily with a meal. 15 mL 11  . Insulin Pen Needle 32G X 6 MM MISC Please use to inject insulin twice daily. ICD 10: E11.65 100 each 11  . lisinopril (PRINIVIL,ZESTRIL) 10 MG tablet TAKE ONE TABLET BY MOUTH  EVERY DAY 90 tablet 1  . loratadine (CLARITIN) 10 MG tablet Take 1 tablet (10 mg total) by mouth daily. 30 tablet 1  . meloxicam (MOBIC) 7.5 MG tablet Take 1 tablet (7.5 mg total) by mouth daily. 30 tablet 0  . metFORMIN (GLUCOPHAGE XR) 500 MG 24 hr tablet Take 2 tablets (1,000 mg total) by mouth daily with breakfast. 180 tablet 1  . pravastatin (PRAVACHOL) 40 MG tablet Take 1 tablet (40 mg total) by mouth every morning. 90 tablet 1  . ranitidine (ZANTAC) 150 MG capsule Take 1 capsule (150 mg total) by mouth daily as needed. 30 capsule 0  . triamcinolone cream (KENALOG) 0.1 % Apply 1 application topically 4 (four) times daily as needed. Do not apply to the face. 15 g 0   No current facility-administered medications for this visit.   Review of Systems  General: Denies fever, diaphoresis, appetite change, and fatigue.  Respiratory: Denies SOB, cough, and wheezing.   Cardiovascular: Denies chest pain and palpitations.  Gastrointestinal: Denies nausea, vomiting, abdominal pain, and diarrhea Musculoskeletal: Denies myalgias, arthralgias, back pain, and gait problem.  Neurological: Denies dizziness, syncope, weakness, lightheadedness, and headaches.  Psychiatric/Behavioral: Denies mood changes, sleep disturbance, and agitation.   Objective:   Physical Exam: Filed Vitals:   05/07/16 1453  BP: 126/69  Pulse: 71  Temp: 97.9 F (36.6 C)  TempSrc: Oral  Height: 5' 7.5" (1.715 m)  Weight: 243 lb 1.6 oz (110.269 kg)  SpO2: 100%    General: Obese white female, alert, cooperative, NAD. HEENT: PERRL, EOMI. Moist mucus membranes.  Neck: Full range of motion without pain, supple, no lymphadenopathy or carotid bruits Lungs: Clear to ascultation bilaterally, normal work of respiration, no wheezes, rales, rhonchi Heart: RRR, no murmurs, gallops, or rubs Abdomen: Soft, non-tender, non-distended, BS + Extremities: No cyanosis, clubbing, or edema.  Neurologic: Alert & oriented x3, cranial nerves  II-XII intact, strength grossly intact, sensation intact to light touch   Assessment & Plan:   Please see problem based assessment and plan.

## 2016-05-08 NOTE — Assessment & Plan Note (Signed)
Foot exam today. 

## 2016-05-08 NOTE — Assessment & Plan Note (Signed)
Refill Pravachol (not taking this)

## 2016-05-08 NOTE — Assessment & Plan Note (Signed)
Lab Results  Component Value Date   HGBA1C 7.6 05/07/2016   HGBA1C 7.2 02/20/2016   HGBA1C 8.0 09/19/2015     Assessment: Diabetes control:  Close to well controlled Progress toward A1C goal:   Not at goal Comments: Patient's main issue is compliance. She has not picked up the insulin pens I prescribed to her at her last visit. She does say that she has been using her friend's insulin pens (same type of insulin) but has not used any insulin for the past 2 weeks. Does state that she has been taking the Metformin.   Plan: Medications:  Resend Rx for Humilin KwikPen 70/30; 30 units bid. Continue Metformin Home glucose monitoring: Frequency:  once/twice daily (has not been doing this recently) Instruction/counseling given: reminded to bring blood glucose meter & log to each visit, reminded to bring medications to each visit, discussed foot care, discussed the need for weight loss and discussed diet Educational resources provided: brochure (denies) Self management tools provided: copy of home glucose meter download Other plans: RTC in 6 weeks.

## 2016-05-08 NOTE — Assessment & Plan Note (Signed)
Has not been taking ACEI recently because she says she "ran out". BP is normal today, however, feel taht she would still benefit from the renal protective benefits of ACEI.  -Refill Lisinopril 10 mg daily

## 2016-05-08 NOTE — Assessment & Plan Note (Signed)
During recent visit, patient stated she was having increasing symptoms of depression. Increase her Prozac to 80 mg daily. Since her last visit, she says she has not experienced any symptoms of depression and feels her mood is much better. She also has a new position at work and is enjoying her job more as well.  -Continue Prozac 80 mg daily

## 2016-05-09 NOTE — Addendum Note (Signed)
Addended by: Doneen PoissonKLIMA, Adia Crammer D on: 05/09/2016 09:27 AM   Modules accepted: Level of Service

## 2016-05-09 NOTE — Progress Notes (Signed)
Case discussed with Dr. Jones at the time of the visit.  We reviewed the resident's history and exam and pertinent patient test results.  I agree with the assessment, diagnosis, and plan of care documented in the resident's note. 

## 2016-06-10 ENCOUNTER — Encounter: Payer: Self-pay | Admitting: *Deleted

## 2016-06-18 ENCOUNTER — Encounter: Payer: Self-pay | Admitting: Internal Medicine

## 2016-06-24 ENCOUNTER — Telehealth: Payer: Self-pay | Admitting: *Deleted

## 2016-06-24 MED ORDER — OLMESARTAN MEDOXOMIL 20 MG PO TABS: 20.0000 mg | ORAL_TABLET | Freq: Every day | ORAL | Status: DC

## 2016-06-24 MED ORDER — PITAVASTATIN CALCIUM 2 MG PO TABS: 2.0000 mg | ORAL_TABLET | Freq: Every day | ORAL | Status: DC

## 2016-06-24 NOTE — Telephone Encounter (Signed)
Pls sch appt early Aug PCP

## 2016-06-24 NOTE — Telephone Encounter (Signed)
Patient enrolled in MAP. Requesting change of lisinopril to Benicar and pravastatin to Livalo. If changes are okay please send new RX. Thanks!

## 2016-07-01 NOTE — Telephone Encounter (Signed)
Please schedule an appointment for early August per Dr. Rogelia BogaButcher. Thanks!

## 2016-08-04 ENCOUNTER — Encounter: Payer: Self-pay | Admitting: Internal Medicine

## 2016-08-04 NOTE — Progress Notes (Signed)
CC: Right foot pain  HPI:  [name] is a 50 y.o. woman here for 3 month follow-up for management of her chronic medical conditions.  She also reports right heel foot/leg pain for last months.  Burning/tingling/numb sensations, worse when standing or walking.  She has taken her husband's gabapentin at night with some relief.  No wounds on foot or skin breakdown.  No radiating pain.  No cold, blue/white feet.  Feels like she is getting weaker, having more troubles standing up from squat at work.  Not exercising.   Past Medical History:  Diagnosis Date  . Anal fissure   . Breast mass   . Bronchitis   . Carpal tunnel syndrome, bilateral   . Cholelithiasis   . Depression   . Diabetes mellitus    Type II  . Essential hypertension   . GERD (gastroesophageal reflux disease)   . Herpes simplex type 1 infection    vaginal  . Hypokalemia    2/2 diarrhea  . Menorrhagia   . Obesity   . Polycystic ovarian disease   . Renal calculi   . Renal calculus   . Sleep apnea, obstructive     Review of Systems:  Review of Systems  Constitutional: Negative for fever and weight loss.  HENT: Positive for tinnitus. Negative for sore throat.   Eyes: Negative for blurred vision and double vision.  Respiratory: Negative for cough, shortness of breath and wheezing.   Cardiovascular: Negative for chest pain and palpitations.  Gastrointestinal: Negative for abdominal pain, blood in stool, constipation, diarrhea, heartburn, melena, nausea and vomiting.  Genitourinary: Negative for dysuria and hematuria.  Musculoskeletal: Negative for falls and myalgias.  Neurological: Positive for sensory change and weakness. Negative for dizziness and headaches.       Burning/tingling pain in feet, worst on R heel.  Psychiatric/Behavioral: Positive for depression. The patient has insomnia.         Physical Exam:  Vitals:   08/05/16 1341  BP: (!) 143/75  Pulse: 72  Temp: 98.7 F (37.1 C)    TempSrc: Oral  SpO2: 99%  Weight: 235 lb (106.6 kg)  Height:  (1.676 m)   Physical Exam  Constitutional: She is oriented to person, place, and time. She appears well-developed and well-nourished. No distress.  HENT:  Head: Normocephalic and atraumatic.  Mouth/Throat: Oropharynx is clear and moist.  Eyes: Conjunctivae are normal. No scleral icterus.  Neck: Normal range of motion. Neck supple.  Cardiovascular: Normal rate, regular rhythm, normal heart sounds and intact distal pulses.   No murmur heard. Pulmonary/Chest: Effort normal and breath sounds normal.  Abdominal: Soft. She exhibits no distension. There is no tenderness.  Musculoskeletal: Normal range of motion. She exhibits no edema or tenderness.  Lymphadenopathy:    She has no cervical adenopathy.  Neurological: She is alert and oriented to person, place, and time.  Sensation reduced to pinprick to ankles bilaterally.  Proprioception intact in great toes bilaterally.  Skin: Skin is warm and dry. No rash noted. No erythema.  Psychiatric: She has a normal mood and affect. Her behavior is normal. Judgment and thought content normal.     BP Readings from Last 3 Encounters:  08/05/16 (!) 143/75  05/07/16 126/69  02/20/16 134/61   Component     Latest Ref Rng & Units 10/04/2014  Microalb, Ur     <2.0 mg/dL 0.3  Creatinine, Urine     mg/dL 16.1  MICROALB/CREAT RATIO     0.0 -  30.0 mg/g 5.6   Lab Results  Component Value Date   HGBA1C 8.6 08/05/2016   HGBA1C 7.6 05/07/2016   HGBA1C 7.2 02/20/2016    Lab Results  Component Value Date   CHOL 178 02/20/2016   HDL 60 02/20/2016   LDLCALC 92 02/20/2016   TRIG 129 02/20/2016   CHOLHDL 3.0 02/20/2016     Assessment & Plan:   See Encounters tab for problem-based charting.  Patient seen with Dr. Cyndie ChimeGranfortuna

## 2016-08-05 ENCOUNTER — Ambulatory Visit (INDEPENDENT_AMBULATORY_CARE_PROVIDER_SITE_OTHER): Payer: Self-pay | Admitting: Internal Medicine

## 2016-08-05 ENCOUNTER — Encounter: Payer: Self-pay | Admitting: Internal Medicine

## 2016-08-05 VITALS — BP 143/75 | HR 72 | Temp 98.7°F | Ht 66.0 in | Wt 235.0 lb

## 2016-08-05 DIAGNOSIS — E119 Type 2 diabetes mellitus without complications: Secondary | ICD-10-CM

## 2016-08-05 DIAGNOSIS — Z794 Long term (current) use of insulin: Secondary | ICD-10-CM

## 2016-08-05 DIAGNOSIS — Z1212 Encounter for screening for malignant neoplasm of rectum: Secondary | ICD-10-CM

## 2016-08-05 DIAGNOSIS — Z124 Encounter for screening for malignant neoplasm of cervix: Secondary | ICD-10-CM | POA: Insufficient documentation

## 2016-08-05 DIAGNOSIS — I10 Essential (primary) hypertension: Secondary | ICD-10-CM

## 2016-08-05 DIAGNOSIS — Z7984 Long term (current) use of oral hypoglycemic drugs: Secondary | ICD-10-CM

## 2016-08-05 DIAGNOSIS — IMO0002 Reserved for concepts with insufficient information to code with codable children: Secondary | ICD-10-CM

## 2016-08-05 DIAGNOSIS — E1165 Type 2 diabetes mellitus with hyperglycemia: Secondary | ICD-10-CM

## 2016-08-05 DIAGNOSIS — Z1211 Encounter for screening for malignant neoplasm of colon: Secondary | ICD-10-CM | POA: Insufficient documentation

## 2016-08-05 DIAGNOSIS — E7800 Pure hypercholesterolemia, unspecified: Secondary | ICD-10-CM

## 2016-08-05 DIAGNOSIS — E1142 Type 2 diabetes mellitus with diabetic polyneuropathy: Secondary | ICD-10-CM

## 2016-08-05 DIAGNOSIS — F3340 Major depressive disorder, recurrent, in remission, unspecified: Secondary | ICD-10-CM

## 2016-08-05 LAB — POCT GLYCOSYLATED HEMOGLOBIN (HGB A1C): HEMOGLOBIN A1C: 8.6

## 2016-08-05 LAB — GLUCOSE, CAPILLARY: GLUCOSE-CAPILLARY: 227 mg/dL — AB (ref 65–99)

## 2016-08-05 MED ORDER — LOVASTATIN 20 MG PO TABS: 20.0000 mg | ORAL_TABLET | Freq: Every day | ORAL | 11 refills | Status: DC

## 2016-08-05 NOTE — Assessment & Plan Note (Deleted)
Due for first colonoscopy and pap smear. -Schedule pap smear -Will refer for screening colonoscopy if she gets insurance

## 2016-08-05 NOTE — Assessment & Plan Note (Addendum)
A1c today 8.6%, up from last.  Is taking metformin as prescribed, but not taking insulin due to cost.  Symptoms and findings consistent with polyneuropathy (burning foot pain, symmetrical distal nociceptive deficit, response to gabapentin). -Encourage annual eye exam -Follow-up with health department regarding insulin

## 2016-08-05 NOTE — Assessment & Plan Note (Signed)
3 years since last pap smear (normal) -Schedule pap

## 2016-08-05 NOTE — Patient Instructions (Addendum)
Please follow-up with Health Department regarding your insulin. Please schedule eye exam and send records to our clinic. [facility]  Take one 20 mg pill at night.  Recommended foods   Make your calories count with these nutritious foods:  Healthy carbohydrates. During digestion, sugars (simple carbohydrates) and starches (complex carbohydrates) break down into blood glucose. Focus on the healthiest carbohydrates, such as fruits, vegetables, whole grains, legumes (beans, peas and lentils) and low-fat dairy products. Fiber-rich foods. Dietary fiber includes all parts of plant foods that your body can't digest or absorb. Fiber moderates how your body digests and helps control blood sugar levels. Foods high in fiber include vegetables, fruits, nuts, legumes (beans, peas and lentils), whole-wheat flour and wheat bran. Heart-healthy fish. Eat heart-healthy fish at least twice a week. Fish can be a good alternative to high-fat meats. For example, cod, tuna and halibut have less total fat, saturated fat and cholesterol than do meat and poultry. Fish such as salmon, mackerel, tuna, sardines and bluefish are rich in omega-3 fatty acids, which promote heart health by lowering blood fats called triglycerides.  Avoid fried fish and fish with high levels of mercury, such as tilefish, swordfish and king mackerel.  "Good" fats. Foods containing monounsaturated and polyunsaturated fats can help lower your cholesterol levels. These include avocados, almonds, pecans, walnuts, olives, and canola, olive and peanut oils. But don't overdo it, as all fats are high in calories. Foods to avoid  Diabetes increases your risk of heart disease and stroke by accelerating the development of clogged and hardened arteries. Foods containing the following can work against your goal of a heart-healthy diet.  Saturated fats. High-fat dairy products and animal proteins such as  beef, hot dogs, sausage and bacon contain saturated fats. Trans fats. These types of fats are found in processed snacks, baked goods, shortening and stick margarines. Avoid these items. Cholesterol. Sources of cholesterol include high-fat dairy products and high-fat animal proteins, egg yolks, liver, and other organ meats. Aim for no more than 200 milligrams (mg) of cholesterol a day. Sodium. Aim for less than 2,300 mg of sodium a day. However, if you also have hypertension, you should aim for less than 1,500 mg of sodium a day.

## 2016-08-05 NOTE — Assessment & Plan Note (Addendum)
BP borderline elevated today at 143/75, previously normal.  Not taking olmesartan due to cost.  Single borderline elevated BP in-office not concerning enough to change therapy. -Continue to follow BPs -Consider alternative antihypertensives on $4 list in future if needed

## 2016-08-05 NOTE — Progress Notes (Signed)
Medicine attending: I personally interviewed and briefly examined this patient on the day of the patient visit and reviewed pertinent clinical and ,laboratory data  with resident physician Dr. Alm BustardMatthew O'Sullivan and we discussed a management plan. We will work with local resources to procure insulin for the patient.

## 2016-08-05 NOTE — Assessment & Plan Note (Signed)
Due for first normal risk colorectal cancer screening. -Refer for colonoscopy if she gets insurance

## 2016-08-05 NOTE — Assessment & Plan Note (Addendum)
Feeling more stressed in past few months, concerned about husband's health and grandson.  Some anxiety and sadness at night, but not during the day and not interfering with work or home life. Taking fluoxetine as prescribed. -On maximum dose fluoxetine -Consider trial of new SSRI if symptoms worsen

## 2016-08-05 NOTE — Assessment & Plan Note (Addendum)
Not taking pitavastatin due to cost.   -Start lovastatin 20 mg at night for moderate intensity statin therapy for primary prevention.  [facility]

## 2016-09-09 ENCOUNTER — Other Ambulatory Visit: Payer: Self-pay | Admitting: Internal Medicine

## 2016-09-09 DIAGNOSIS — Z794 Long term (current) use of insulin: Principal | ICD-10-CM

## 2016-09-09 DIAGNOSIS — E1165 Type 2 diabetes mellitus with hyperglycemia: Principal | ICD-10-CM

## 2016-09-09 DIAGNOSIS — IMO0001 Reserved for inherently not codable concepts without codable children: Secondary | ICD-10-CM

## 2016-09-16 ENCOUNTER — Other Ambulatory Visit: Payer: Self-pay | Admitting: *Deleted

## 2016-09-16 DIAGNOSIS — E1165 Type 2 diabetes mellitus with hyperglycemia: Principal | ICD-10-CM

## 2016-09-16 DIAGNOSIS — Z794 Long term (current) use of insulin: Principal | ICD-10-CM

## 2016-09-16 DIAGNOSIS — IMO0001 Reserved for inherently not codable concepts without codable children: Secondary | ICD-10-CM

## 2016-09-16 MED ORDER — INSULIN ISOPHANE & REGULAR (HUMAN 70-30)100 UNIT/ML KWIKPEN: 30.0000 [IU] | PEN_INJECTOR | Freq: Two times a day (BID) | SUBCUTANEOUS | 11 refills | Status: DC

## 2016-09-16 NOTE — Telephone Encounter (Signed)
Received faxed refill request that pt's Humulin 70/30 Stephanie Coupkwikpen has arrived, but [facility] needs a new rx,.  Will send to pcp for refill.Kingsley SpittleGoldston, Joelle [place] Cassady9/18/201712:26 PM

## 2016-10-07 ENCOUNTER — Ambulatory Visit (INDEPENDENT_AMBULATORY_CARE_PROVIDER_SITE_OTHER): Payer: Self-pay | Admitting: Internal Medicine

## 2016-10-07 ENCOUNTER — Other Ambulatory Visit (HOSPITAL_COMMUNITY)
Admission: RE | Admit: 2016-10-07 | Discharge: 2016-10-07 | Disposition: A | Discharge status: Home / Self Care | Payer: Self-pay | Source: Ambulatory Visit | Attending: Internal Medicine | Admitting: Internal Medicine

## 2016-10-07 ENCOUNTER — Encounter: Payer: Self-pay | Admitting: Internal Medicine

## 2016-10-07 VITALS — BP 135/72 | HR 79 | Temp 98.9°F | Ht 66.0 in | Wt 239.5 lb

## 2016-10-07 DIAGNOSIS — Z124 Encounter for screening for malignant neoplasm of cervix: Secondary | ICD-10-CM

## 2016-10-07 DIAGNOSIS — Z01419 Encounter for gynecological examination (general) (routine) without abnormal findings: Secondary | ICD-10-CM | POA: Insufficient documentation

## 2016-10-07 DIAGNOSIS — S60456A Superficial foreign body of right little finger, initial encounter: Secondary | ICD-10-CM

## 2016-10-07 DIAGNOSIS — T148XXA Other injury of unspecified body region, initial encounter: Secondary | ICD-10-CM | POA: Insufficient documentation

## 2016-10-07 DIAGNOSIS — Y9389 Activity, other specified: Secondary | ICD-10-CM

## 2016-10-07 DIAGNOSIS — W458XXA Other foreign body or object entering through skin, initial encounter: Secondary | ICD-10-CM

## 2016-10-07 NOTE — Assessment & Plan Note (Signed)
Due for screening pap smear.  Cervix normal, pap smear obtained. -Cytology with reflex HPV -Repeat in 3 years or as indicated by pap results

## 2016-10-07 NOTE — Assessment & Plan Note (Signed)
No signs of skin/soft tissue infection, still in acute phase. -RTC if signs of infection or if symptoms worsen

## 2016-10-07 NOTE — Patient Instructions (Addendum)
You were seen today for a pap smear for cervical cancer screening today.  I will contact you with the results.  You have a regular appointment scheduled on 11/18/2016 to continue following your other medical problems.  If the splinter in your finger does not work its way out and get better by the end of the week, please call back for an appointment.  If the rest of the finger becomes red, swollen, and painful, please call immediately as these could be signs of an infection.

## 2016-10-07 NOTE — Progress Notes (Signed)
   CC: pap smear  HPI:  [name] is a 50 y.o. woman here for screening pap smear.  Her period small.  She also reports a splinter in her right 5th finger that she got 2 days ago while moving a palette.  The tip of the finger is painful, but no exudation or swelling of the rest of the finger.   Past Medical History:  Diagnosis Date  . Anal fissure   . Breast mass   . Bronchitis   . Carpal tunnel syndrome, bilateral   . Cholelithiasis   . Depression   . Diabetes mellitus    Type II  . Essential hypertension   . GERD (gastroesophageal reflux disease)   . Herpes simplex type 1 infection    vaginal  . Hypokalemia    2/2 diarrhea  . Menorrhagia   . Obesity   . Polycystic ovarian disease   . Renal calculi   . Renal calculus   . Sleep apnea, obstructive    Review of Systems  Constitutional: Negative for chills and fever.  Genitourinary:       No spotting, abdominal pain, vulvovaginal pain, abnormal vaginal discharge  Skin:       Painful wooden splinter in R 5th finger    Physical Exam:  Vitals:   10/07/16 1338  BP: 135/72  Pulse: 79  Temp: 98.9 F (37.2 C)  TempSrc: Oral  SpO2: 97%  Weight: 239 lb 8 oz (108.6 kg)  Height: 5\' 6"  (1.676 m)   Physical Exam  Constitutional: She is oriented to person, place, and time. She appears well-developed and well-nourished. No distress.  Genitourinary: Pelvic exam was performed with patient supine.  Genitourinary Comments: Normal vulva without rash, erythema, leukoplakia. Normal cervix without bleeding or discharge Pap smear obtained with cervix brush  Neurological: She is oriented to person, place, and time.  Skin:  Mild erythema surround ~163mm linear streak of pallor immediately ulnar to the nail of R 5th finger. No erythema or streaking of proximal finger or hand.   Psychiatric: She has a normal mood and affect. Her behavior is normal.     Assessment & Plan:   See Encounters Tab for problem based  charting.  Patient seen with Dr. Rogelia BogaButcher

## 2016-10-08 NOTE — Progress Notes (Signed)
Internal Medicine Clinic Attending  I saw and evaluated the patient.  I personally confirmed the key portions of the history and exam documented by Dr. O'Sullivan and I reviewed pertinent patient test results.  The assessment, diagnosis, and plan were formulated together and I agree with the documentation in the resident's note.   

## 2016-10-09 ENCOUNTER — Encounter: Payer: Self-pay | Admitting: Internal Medicine

## 2016-10-09 LAB — CYTOLOGY - PAP

## 2016-10-10 ENCOUNTER — Telehealth: Payer: Self-pay | Admitting: Dietician

## 2016-10-14 ENCOUNTER — Other Ambulatory Visit: Payer: Self-pay | Admitting: Internal Medicine

## 2016-10-14 DIAGNOSIS — F3340 Major depressive disorder, recurrent, in remission, unspecified: Secondary | ICD-10-CM

## 2016-10-14 MED ORDER — FLUOXETINE HCL 40 MG PO CAPS: 80.0000 mg | ORAL_CAPSULE | Freq: Every day | ORAL | 5 refills | Status: DC

## 2016-10-17 NOTE — Telephone Encounter (Signed)
calling to discuss diabetes self management. No answer.

## 2016-10-24 NOTE — Telephone Encounter (Signed)
Called and spoke to patient: she feels her diabetes self care is good. She says her blood sugars are mostly around 180-250 and she does not have nay low blood sugars or symptoms of low blood sugar. She is on  70/30 insulin 30 units twice a day. She [place] [facility] and likes them a lot.  P:She wanted to talk later about her physical activity and meal planning as she was out to lunch.

## 2016-11-17 DIAGNOSIS — Z23 Encounter for immunization: Secondary | ICD-10-CM | POA: Insufficient documentation

## 2016-11-17 NOTE — Assessment & Plan Note (Deleted)
BP Readings from Last 3 Encounters:  10/07/16 135/72  08/05/16 (!) 143/75  05/07/16 126/69   Lab Results  Component Value Date   CREATININE 0.62 02/20/2016   Lab Results  Component Value Date   K 3.9 02/20/2016    Current medications: None.  Previously taking olmesartan 20 mg, but was unable to afford the medication.  Assessment BP goal: 140/90 BP control: controlled  Plan Medications:  Other: counseled regarding diet and exercise

## 2016-11-17 NOTE — Assessment & Plan Note (Deleted)
[facility] 2/9 08/05/2016 05/07/2016 02/20/2016 09/19/2015 08/10/2015  Decreased Interest 0 1 2 0 0  Down, Depressed, Hopeless 0 1 2 0 0  PHQ - 2 Score 0 2 4 0 0  Altered sleeping - 0 3 - -  Tired, decreased energy - 1 3 - -  Change in appetite - 0 0 - -  Feeling bad or failure about yourself  - 0 0 - -  Trouble concentrating - 0 0 - -  Moving slowly or fidgety/restless - 1 3 - -  Suicidal thoughts - 0 0 - -  PHQ-9 Score - 4 13 - -  Difficult doing work/chores - Not difficult at all Not difficult at all - -  Some recent data might be hidden    Current medications: fluoexetine 80 mg daily  Previous medications:  Assessment   Plan  Medications: continue fluoxetine 80 mg daily  Other:

## 2016-11-17 NOTE — Assessment & Plan Note (Deleted)
Lab Results  Component Value Date   HGBA1C 8.6 08/05/2016   HGBA1C 7.6 05/07/2016   HGBA1C 7.2 02/20/2016    No results for input(s): GLUCAP in the last 72 hours.  Current medications: metformin XR 1000 mg daily Current insulin: 70/30 Humulin 30U BID  Assessment HgbA1c goal:  7.0 Glycemic control: Complications: peripheral neuropathy  Her increased A1c at last visit correlated with her inability to afford her prescribed insulin.  Plan Medications: increase metformin Insulin: Other:  -counseled regarding diet and exercise -annual eye exam

## 2016-11-17 NOTE — Progress Notes (Deleted)
   CC: ***  HPI:  [name] is a 50 y.o. woman with HTN, DM2, and depression who presents for management of diabetes.  Please see A&P for status of the patient's chronic medical conditions.  Her last regular visit was 07/2016, and she was also seen in 09/2016 for pap smear.  Past Medical History:  Diagnosis Date  . Anal fissure   . Breast mass   . Bronchitis   . Carpal tunnel syndrome, bilateral   . Cholelithiasis   . Depression   . Diabetes mellitus    Type II  . Essential hypertension   . GERD (gastroesophageal reflux disease)   . Herpes simplex type 1 infection    vaginal  . Hypokalemia    2/2 diarrhea  . Menorrhagia   . Obesity   . Polycystic ovarian disease   . Renal calculi   . Renal calculus   . Sleep apnea, obstructive     Review of Systems:  ROS      Physical Exam:  There were no vitals filed for this visit. Physical Exam    Assessment & Plan:   See Encounters tab for problem-based charting.  Patient seen with Dr. Marland Kitchen***

## 2016-11-18 ENCOUNTER — Encounter: Payer: Self-pay | Admitting: Internal Medicine

## 2016-12-09 ENCOUNTER — Other Ambulatory Visit: Payer: Self-pay | Admitting: Internal Medicine

## 2017-01-02 ENCOUNTER — Other Ambulatory Visit: Payer: Self-pay | Admitting: Internal Medicine

## 2017-01-02 DIAGNOSIS — Z794 Long term (current) use of insulin: Principal | ICD-10-CM

## 2017-01-02 DIAGNOSIS — E1165 Type 2 diabetes mellitus with hyperglycemia: Principal | ICD-10-CM

## 2017-01-02 DIAGNOSIS — IMO0001 Reserved for inherently not codable concepts without codable children: Secondary | ICD-10-CM

## 2017-01-07 ENCOUNTER — Other Ambulatory Visit: Payer: Self-pay | Admitting: *Deleted

## 2017-01-07 MED ORDER — OLMESARTAN MEDOXOMIL 20 MG PO TABS: 20.0000 mg | ORAL_TABLET | Freq: Every day | ORAL | 2 refills | Status: DC

## 2017-01-14 ENCOUNTER — Other Ambulatory Visit: Payer: Self-pay | Admitting: Internal Medicine

## 2017-01-28 ENCOUNTER — Telehealth: Payer: Self-pay | Admitting: Dietician

## 2017-01-28 NOTE — Telephone Encounter (Signed)
Left message for patient to call if interested in meeting for diabetes self management training on same day as her doctor appointment next week

## 2017-01-29 ENCOUNTER — Encounter (HOSPITAL_COMMUNITY): Payer: Self-pay | Admitting: Emergency Medicine

## 2017-01-29 ENCOUNTER — Emergency Department (HOSPITAL_COMMUNITY)
Admission: EM | Admit: 2017-01-29 | Discharge: 2017-01-29 | Disposition: A | Discharge status: Home / Self Care | Payer: Self-pay | Attending: Emergency Medicine | Admitting: Emergency Medicine

## 2017-01-29 DIAGNOSIS — Y9389 Activity, other specified: Secondary | ICD-10-CM | POA: Insufficient documentation

## 2017-01-29 DIAGNOSIS — Z87891 Personal history of nicotine dependence: Secondary | ICD-10-CM | POA: Insufficient documentation

## 2017-01-29 DIAGNOSIS — X500XXA Overexertion from strenuous movement or load, initial encounter: Secondary | ICD-10-CM | POA: Insufficient documentation

## 2017-01-29 DIAGNOSIS — E119 Type 2 diabetes mellitus without complications: Secondary | ICD-10-CM | POA: Insufficient documentation

## 2017-01-29 DIAGNOSIS — Z794 Long term (current) use of insulin: Secondary | ICD-10-CM | POA: Insufficient documentation

## 2017-01-29 DIAGNOSIS — Y990 Civilian activity done for income or pay: Secondary | ICD-10-CM | POA: Insufficient documentation

## 2017-01-29 DIAGNOSIS — S46912A Strain of unspecified muscle, fascia and tendon at shoulder and upper arm level, left arm, initial encounter: Secondary | ICD-10-CM | POA: Insufficient documentation

## 2017-01-29 DIAGNOSIS — T148XXA Other injury of unspecified body region, initial encounter: Secondary | ICD-10-CM

## 2017-01-29 DIAGNOSIS — Y929 Unspecified place or not applicable: Secondary | ICD-10-CM | POA: Insufficient documentation

## 2017-01-29 DIAGNOSIS — I10 Essential (primary) hypertension: Secondary | ICD-10-CM | POA: Insufficient documentation

## 2017-01-29 MED ORDER — NAPROXEN 500 MG PO TABS: 500.0000 mg | ORAL_TABLET | Freq: Two times a day (BID) | ORAL | 0 refills | Status: DC

## 2017-01-29 MED ORDER — TRAMADOL HCL 50 MG PO TABS
50.0000 mg | ORAL_TABLET | Freq: Once | ORAL | Status: AC
  Administered 2017-01-29: 50 mg via ORAL
  Filled 2017-01-29: qty 1

## 2017-01-29 MED ORDER — METHOCARBAMOL 500 MG PO TABS
750.0000 mg | ORAL_TABLET | Freq: Once | ORAL | Status: AC
  Administered 2017-01-29: 750 mg via ORAL
  Filled 2017-01-29: qty 2

## 2017-01-29 MED ORDER — METHOCARBAMOL 500 MG PO TABS: 500.0000 mg | ORAL_TABLET | Freq: Two times a day (BID) | ORAL | 0 refills | Status: DC

## 2017-01-29 NOTE — ED Provider Notes (Signed)
[facility]-EMERGENCY DEPT Provider Note   CSN: [id number] Arrival date & time: 01/29/17  1055  By signing my name below, I, Majel Homer, attest that this documentation has been prepared under the direction and in the presence of Melburn Hake, PA-C . Electronically Signed: Majel Homer, Scribe. 01/29/2017. 11:35 AM.  History   Chief Complaint Chief Complaint  Patient presents with  . Back Pain   The history is provided by the patient. No language interpreter was used.   HPI Comments: Amy Norris is a 51 y.o.left hand dominant female with PMHx of DM2, who presents to the Emergency Department complaining of gradually worsening, left shoulder pain s/p an injury that occurred while at work this morning. Pt reports she was picking up a large box at work this morning when the box suddenly slipped and she tried to catch it, causing a large "jolt of pain" in her left scapular region. She states her pain is exacerbated with movement and when taking a deep breath. She notes she did not fall or injure any other body part during this incident. Pt reports she has taken gabapentin and ibuprofen for her pain with no relief PTA and applied a heating pad to her shoulder with mild relief. She denies hx of injury to her left shoulder, back pain, neck pain, numbness or tingling in her extremities, and hx of GI bleed.   Past Medical History:  Diagnosis Date  . Anal fissure   . Breast mass   . Bronchitis   . Carpal tunnel syndrome, bilateral   . Cholelithiasis   . Depression   . Diabetes mellitus    Type II  . Essential hypertension   . GERD (gastroesophageal reflux disease)   . Herpes simplex type 1 infection    vaginal  . Hypokalemia    2/2 diarrhea  . Menorrhagia   . Obesity   . Polycystic ovarian disease   . Renal calculi   . Renal calculus   . Sleep apnea, obstructive     Patient Active Problem List   Diagnosis Date Noted  . Influenza vaccine needed 11/17/2016  . Splinter in skin 10/07/2016    . Screening for colorectal cancer 08/05/2016  . Cervical cancer screening 08/05/2016  . Encounter for immunization 09/20/2015  . Rash and nonspecific skin eruption 08/10/2015  . Left knee pain 04/11/2015  . Right elbow pain 10/04/2014  . Hypercholesteremia 11/05/2012  . Preventative health care 02/14/2011  . ESSENTIAL HYPERTENSION, BENIGN 01/14/2008  . Diabetes mellitus type II, uncontrolled (HCC) 12/04/2006  . POLYCYSTIC OVARIAN DISEASE 12/04/2006  . Depression, major, recurrent, in remission (HCC) 12/04/2006  . SLEEP APNEA, OBSTRUCTIVE 12/04/2006  . CARPAL TUNNEL SYNDROME 12/04/2006  . GERD 12/04/2006    Past Surgical History:  Procedure Laterality Date  . CHOLECYSTECTOMY  11/08  . TUBAL LIGATION  1991   1991    OB History    No data available     Home Medications    Prior to Admission medications   Medication Sig Start Date End Date Taking? Authorizing Provider  benzonatate (TESSALON) 100 MG capsule TAKE ONE CAPSULE BY MOUTH THREE TIMES DAILY AS NEEDED FOR COUGH 01/17/17   Alm Bustard, MD  FLUoxetine (PROZAC) 40 MG capsule Take 2 capsules (80 mg total) by mouth daily. 10/14/16   Alm Bustard, MD  gabapentin (NEURONTIN) 300 MG capsule Take 1 capsule (300 mg total) by mouth at bedtime. 05/07/16 05/07/17  Courtney Paris, MD  glucose blood test strip Use to check blood  sugar 3 to 4 times daily. Diag code E11.65. Insulin dependent 01/30/15 11/11/17  Courtney Paris, MD  Insulin Isophane & Regular Human (HUMULIN 70/30 KWIKPEN) (70-30) 100 UNIT/ML PEN Inject 30 Units into the skin 2 (two) times daily with a meal. 09/16/16   Alm Bustard, MD  Insulin Pen Needle 32G X 6 MM MISC Please use to inject insulin twice daily. ICD 10: E11.65 05/07/16   Courtney Paris, MD  loratadine (CLARITIN) 10 MG tablet Take 1 tablet (10 mg total) by mouth daily. 08/11/15 08/10/16  Marrian Salvage, MD  lovastatin (MEVACOR) 20 MG tablet Take 1 tablet (20 mg total) by mouth daily. 08/05/16 08/05/17  Alm Bustard, MD  metFORMIN (GLUCOPHAGE-XR) 500 MG 24 hr tablet TAKE TWO TABLETS BY MOUTH ONCE DAILY WITH  BREAKFAST 01/03/17   Alm Bustard, MD  methocarbamol (ROBAXIN) 500 MG tablet Take 1 tablet (500 mg total) by mouth 2 (two) times daily. 01/29/17   Barrett Henle, PA-C  naproxen (NAPROSYN) 500 MG tablet Take 1 tablet (500 mg total) by mouth 2 (two) times daily. 01/29/17   Barrett Henle, PA-C  olmesartan (BENICAR) 20 MG tablet Take 1 tablet (20 mg total) by mouth daily. 01/07/17   Alm Bustard, MD    Family History Family History  Problem Relation Age of Onset  . Hypertension Mother   . Diabetes Father     Social History Social History  Substance Use Topics  . Smoking status: Former Smoker    Types: Cigarettes    Quit date: 12/30/1978  . Smokeless tobacco: Never Used  . Alcohol use No     Allergies   Vicodin [hydrocodone-acetaminophen] and Metformin and related  Review of Systems Review of Systems  Musculoskeletal: Positive for arthralgias. Negative for back pain and neck pain.  Neurological: Negative for weakness and numbness.   Physical Exam Updated Vital Signs BP 134/83 (BP Location: Right Arm)   Pulse 71   Temp 98.7 F (37.1 C) (Oral)   Resp 16   Ht 5\' 5"  (1.651 m)   Wt 245 lb (111.1 kg)   SpO2 99%   BMI 40.77 kg/m   Physical Exam  Constitutional: She is oriented to person, place, and time. She appears well-developed and well-nourished.  HENT:  Head: Normocephalic and atraumatic.  Eyes: Conjunctivae and EOM are normal. Right eye exhibits no discharge. Left eye exhibits no discharge. No scleral icterus.  Neck: Normal range of motion. Neck supple.  Cardiovascular: Normal rate and intact distal pulses.   Pulmonary/Chest: Effort normal.  Musculoskeletal: Normal range of motion. She exhibits tenderness. She exhibits no edema or deformity.  No midline C, T, or L tenderness. TTP over left rhomboids with palpable muscle spasm. Full range of  motion of neck and back. Full range of motion of bilateral upper and lower extremities, with 5/5 strength. Sensation intact. 2+ radial and PT pulses. Cap refill <2 seconds. Patient able to stand and ambulate without assistance.   Neurological: She is alert and oriented to person, place, and time.  Skin: Skin is warm and dry. Capillary refill takes less than 2 seconds.  Nursing note and vitals reviewed.  ED Treatments / Results  Labs (all labs ordered are listed, but only abnormal results are displayed) Labs Reviewed - No data to display  EKG  EKG Interpretation None       Radiology No results found.  Procedures Procedures (including critical care time)  Medications Ordered in ED Medications  traMADol (ULTRAM) tablet 50 mg (  50 mg Oral Given 01/29/17 1138)  methocarbamol (ROBAXIN) tablet 750 mg (750 mg Oral Given 01/29/17 1138)    DIAGNOSTIC STUDIES:  Oxygen Saturation is 99% on RA, normal by my interpretation.    COORDINATION OF CARE:  11:32 AM Discussed treatment plan with pt at bedside and pt agreed to plan.  Initial Impression / Assessment and Plan / ED Course  I have reviewed the triage vital signs and the nursing notes.  Pertinent labs & imaging results that were available during my care of the patient were reviewed by me and considered in my medical decision making (see chart for details).     Patient presents with left scapular pain that started after she tried to grab a heavy box at work to prevent it from falling. Reports the pain is worse with movement of her left arm. Denies swelling, numbness, tingling, weakness. VSS. Exam revealed tenderness over left rhomboids with palpable muscle spasm. No tenderness over left shoulder, arm or scapula. No deformity present. Bilateral upper extremities are neurovascular intact. No midline spinal tenderness. Remaining exam unremarkable. Suspect patient's pain is likely due to muscle strain/spasm associated with recent injury.  Patient reports mild improvement after he was applied in the ED. Plan to discharge patient home with symptomatic treatment including NSAIDs, muscle relaxant. Patient given information to follow-up with orthopedics as needed. Discussed return precautions.  I personally performed the services described in this documentation, which was scribed in my presence. The recorded information has been reviewed and is accurate.   Final Clinical Impressions(s) / ED Diagnoses   Final diagnoses:  Muscle strain    New Prescriptions Discharge Medication List as of 01/29/2017 11:57 AM    START taking these medications   Details  methocarbamol (ROBAXIN) 500 MG tablet Take 1 tablet (500 mg total) by mouth 2 (two) times daily., Starting Wed 01/29/2017, Print    naproxen (NAPROSYN) 500 MG tablet Take 1 tablet (500 mg total) by mouth 2 (two) times daily., Starting Wed 01/29/2017, Print         Satira Sarkicole Elizabeth ClaiborneNadeau, [place] JerseyPA-C 01/29/17 1204    Maia PlanJoshua G Long, MD 01/29/17 [phone]

## 2017-01-29 NOTE — Discharge Instructions (Signed)
Take your medications as prescribed. I also recommend applying ice and/or heat to affected area for 15-20 minutes 3-4 times daily for additional pain relief. I recommend refraining from doing any heavy lifting or repetitive movements for the next few days. Follow-up with the orthopedic clinic listed below for symptoms have not improved over the next week. Return to emergency department if symptoms worsen or new onset of fever, swelling, decreased range of motion, numbness, tingling, weakness.

## 2017-01-29 NOTE — ED Triage Notes (Signed)
Pt sent here from her job -- was lifting a heavy tub at work and it slippted, tried to catch it-- now has pain in left scapular area and shoulder.

## 2017-01-29 NOTE — ED Notes (Signed)
Pt given heating pack to place on left shoulder. Pt also reports taking 600 ibuprofen and gabapentin 30 minutes PTA.

## 2017-01-31 ENCOUNTER — Telehealth: Payer: Self-pay | Admitting: Internal Medicine

## 2017-01-31 NOTE — Telephone Encounter (Signed)
APT. REMINDER CALL, LMTCB °

## 2017-02-02 DIAGNOSIS — E1142 Type 2 diabetes mellitus with diabetic polyneuropathy: Secondary | ICD-10-CM | POA: Insufficient documentation

## 2017-02-02 NOTE — Progress Notes (Signed)
   CC: "I went to the ED a couple of days ago for pain between my shoulder blades."  HPI:  [name] is a 51 y.o. woman with history of DM2, HTN, and depression who presents for follow-up of diabetes.  Please see A&P for status of the patient's chronic medical conditions.  Past Medical History:  Diagnosis Date  . Anal fissure   . Breast mass   . Bronchitis   . Carpal tunnel syndrome, bilateral   . Cholelithiasis   . Depression   . Diabetes mellitus    Type II  . Essential hypertension   . GERD (gastroesophageal reflux disease)   . Herpes simplex type 1 infection    vaginal  . Hypokalemia    2/2 diarrhea  . Menorrhagia   . Obesity   . Polycystic ovarian disease   . Renal calculi   . Renal calculus   . Sleep apnea, obstructive     Review of Systems:   Review of Systems  Constitutional: Negative for chills and fever.  Respiratory: Negative for cough and shortness of breath.   Cardiovascular: Negative for chest pain and palpitations.  Musculoskeletal: Positive for myalgias.  Psychiatric/Behavioral: Negative for depression. The patient is not nervous/anxious.    Physical Exam:  Vitals:   02/03/17 1328  BP: (!) 142/96  Pulse: 72  Temp: 98.4 F (36.9 C)  TempSrc: Oral  SpO2: 100%  Weight: 259 lb 6.4 oz (117.7 kg)   Physical Exam  Constitutional: She is oriented to person, place, and time. She appears well-developed and well-nourished. No distress.  Cardiovascular: Normal rate and regular rhythm.   Pulmonary/Chest: Effort normal and breath sounds normal.  Musculoskeletal:  No midline thoracic spine tenderness Point tenderness medial to L scapula Active ROM full in bilateral shoulders  Neurological: She is alert and oriented to person, place, and time.  Strength and sensation full in bilateral UEs    Assessment & Plan:   See Encounters Tab for problem based charting.  Patient discussed with Dr. Cleda DaubE. Hoffman

## 2017-02-02 NOTE — Assessment & Plan Note (Addendum)
BP Readings from Last 3 Encounters:  02/03/17 (!) 133/99  01/29/17 134/83  10/07/16 135/72   Initial 142/96 today.  Lab Results  Component Value Date   CREATININE 0.62 02/20/2016   Lab Results  Component Value Date   K 3.9 02/20/2016    Current medications: olmesartan 20 mg daily  Assessment BP goal: <140/90 BP control: near goal  BP above goal today on check and recheck , previously just below goal.  Plan With her BPs persistently around goal and only on low dose ARB, I think increasing olmesartan is reasonable to achieve better BP control.  Medications: increase olmesartan to 40 mg daily Other: -discussed diet, exercise, and weight loss

## 2017-02-02 NOTE — Assessment & Plan Note (Addendum)
Had been taking her husbands gabapentin due to inability to afford gabapentin.  It helps burning and pins and needles feeling in her feet.  Makes her slightly tired, but benefit outweighs the somnolence  -gabapentin 300 mg TID -[facility]

## 2017-02-02 NOTE — Assessment & Plan Note (Addendum)
  [facility] 08/05/2016 05/07/2016 02/20/2016 09/19/2015 08/10/2015  Decreased Interest 0 1 2 0 0  Down, Depressed, Hopeless 0 1 2 0 0  PHQ - 2 Score 0 2 4 0 0  Altered sleeping - 0 3 - -  Tired, decreased energy - 1 3 - -  Change in appetite - 0 0 - -  Feeling bad or failure about yourself  - 0 0 - -  Trouble concentrating - 0 0 - -  Moving slowly or fidgety/restless - 1 3 - -  Suicidal thoughts - 0 0 - -  PHQ-9 Score - 4 13 - -  Difficult doing work/chores - Not difficult at all Not difficult at all - -  Some recent data might be hidden   Current medications: fluoxetine 80 mg daily  Previous medications:  Reports feeling sad 2-3 days per week for less than half the day, does not think her mood is interfering with her life.  Assessment Depression in remission.  Plan  Medications: continue current meds  Other:

## 2017-02-02 NOTE — Assessment & Plan Note (Addendum)
Lab Results  Component Value Date   HGBA1C 7.6 02/03/2017   HGBA1C 8.6 08/05/2016   HGBA1C 7.6 05/07/2016    Recent Labs  02/03/17 1325  GLUCAP 171*   Current medications: metformin 1000 mg daily Current insulin: Humalog 70/30 30U twice daily  In 07/2016, she was not taking prescribed insulin due to cost.  She has now been taking 70/30 as prescribed for about 3 months.  Checking fasting AM BGs and evening before bed, 150s-250s, lowest 117.  Has had symptomatic events in the past, none recently.  Assessment HgbA1c goal: <7.0 Glycemic control: improving, above goal Complications: polyneuropathy  Plan Medications: continue current meds Insulin: increasing 70/30 to 35U BID Other: -A1c today -fundus photography for DR screening -BMP

## 2017-02-03 ENCOUNTER — Encounter: Payer: Self-pay | Admitting: Internal Medicine

## 2017-02-03 ENCOUNTER — Ambulatory Visit (INDEPENDENT_AMBULATORY_CARE_PROVIDER_SITE_OTHER): Payer: Self-pay | Admitting: Internal Medicine

## 2017-02-03 VITALS — BP 133/99 | HR 66 | Temp 98.4°F | Wt 259.4 lb

## 2017-02-03 DIAGNOSIS — S29019D Strain of muscle and tendon of unspecified wall of thorax, subsequent encounter: Secondary | ICD-10-CM | POA: Insufficient documentation

## 2017-02-03 DIAGNOSIS — IMO0001 Reserved for inherently not codable concepts without codable children: Secondary | ICD-10-CM

## 2017-02-03 DIAGNOSIS — Z79899 Other long term (current) drug therapy: Secondary | ICD-10-CM

## 2017-02-03 DIAGNOSIS — F3340 Major depressive disorder, recurrent, in remission, unspecified: Secondary | ICD-10-CM

## 2017-02-03 DIAGNOSIS — I10 Essential (primary) hypertension: Secondary | ICD-10-CM

## 2017-02-03 DIAGNOSIS — Z1239 Encounter for other screening for malignant neoplasm of breast: Secondary | ICD-10-CM

## 2017-02-03 DIAGNOSIS — E1142 Type 2 diabetes mellitus with diabetic polyneuropathy: Secondary | ICD-10-CM

## 2017-02-03 DIAGNOSIS — S29012A Strain of muscle and tendon of back wall of thorax, initial encounter: Secondary | ICD-10-CM

## 2017-02-03 DIAGNOSIS — Z794 Long term (current) use of insulin: Secondary | ICD-10-CM

## 2017-02-03 DIAGNOSIS — X500XXA Overexertion from strenuous movement or load, initial encounter: Secondary | ICD-10-CM

## 2017-02-03 DIAGNOSIS — Z23 Encounter for immunization: Secondary | ICD-10-CM

## 2017-02-03 DIAGNOSIS — Z87891 Personal history of nicotine dependence: Secondary | ICD-10-CM

## 2017-02-03 DIAGNOSIS — E1165 Type 2 diabetes mellitus with hyperglycemia: Secondary | ICD-10-CM

## 2017-02-03 DIAGNOSIS — IMO0002 Reserved for concepts with insufficient information to code with codable children: Secondary | ICD-10-CM

## 2017-02-03 LAB — POCT GLYCOSYLATED HEMOGLOBIN (HGB A1C): Hemoglobin A1C: 7.6

## 2017-02-03 LAB — GLUCOSE, CAPILLARY: GLUCOSE-CAPILLARY: 171 mg/dL — AB (ref 65–99)

## 2017-02-03 LAB — HM DIABETES EYE EXAM

## 2017-02-03 MED ORDER — INSULIN ISOPHANE & REGULAR (HUMAN 70-30)100 UNIT/ML KWIKPEN: 35.0000 [IU] | PEN_INJECTOR | Freq: Two times a day (BID) | SUBCUTANEOUS | 11 refills | Status: DC

## 2017-02-03 MED ORDER — OLMESARTAN MEDOXOMIL 40 MG PO TABS
40.0000 mg | ORAL_TABLET | Freq: Every day | ORAL | 5 refills | Status: DC
Start: 2017-02-03 — End: 2017-02-03

## 2017-02-03 MED ORDER — GABAPENTIN 300 MG PO CAPS: 300.0000 mg | ORAL_CAPSULE | Freq: Three times a day (TID) | ORAL | 3 refills | Status: DC

## 2017-02-03 MED ORDER — OLMESARTAN MEDOXOMIL 40 MG PO TABS: 40.0000 mg | ORAL_TABLET | Freq: Every day | ORAL | 5 refills | Status: DC

## 2017-02-03 MED FILL — GABAPENTIN 300 MG CAPSULE: 300 | 30 days supply | Qty: 90 | Fill #0

## 2017-02-03 NOTE — Assessment & Plan Note (Signed)
Flu vaccine today 

## 2017-02-03 NOTE — Patient Instructions (Addendum)
You diabetes is doing much better.  Lets try increasing your insulin to 35U twice daily and see if we can get your sugars down a bit lower.  If you feel bad or have sugars less than 80, please call the clinic and go back to taking 30 twice a day.  For your high blood pressure, lets go up on the Benicar to 40 mg from 20 mg.  Try and get some exercise by walking at least 3 times per week!

## 2017-02-03 NOTE — Assessment & Plan Note (Addendum)
Seen in ED on 1/31 for left-sided upper back pain after catching a heavy box at work.  Tenderness medial to left scapula over rhomboid.  She was prescribed methocarbamol and naproxen twice daily for 2 weeks.  Heat has also helped the pain.  She is currently on light duty at work.  The pain is improving, she has no radicular symptoms, and full strength and ROM.  -continue 10 day course of methocarbamol and naproxen -RTC PRN

## 2017-02-04 LAB — BMP8+ANION GAP
ANION GAP: 16 mmol/L (ref 10.0–18.0)
BUN / CREAT RATIO: 24 — AB (ref 9–23)
BUN: 14 mg/dL (ref 6–24)
CHLORIDE: 99 mmol/L (ref 96–106)
CO2: 22 mmol/L (ref 18–29)
Calcium: 9 mg/dL (ref 8.7–10.2)
Creatinine, Ser: 0.58 mg/dL (ref 0.57–1.00)
GFR calc Af Amer: 124 mL/min/{1.73_m2} (ref >59)
GFR calc non Af Amer: 108 mL/min/{1.73_m2} (ref >59)
GLUCOSE: 173 mg/dL — AB (ref 65–99)
POTASSIUM: 3.9 mmol/L (ref 3.5–5.2)
Sodium: 137 mmol/L (ref 134–144)

## 2017-02-04 NOTE — Progress Notes (Signed)
Internal Medicine Clinic Attending  Case discussed with Dr. O'Sullivan at the time of the visit.  We reviewed the resident's history and exam and pertinent patient test results.  I agree with the assessment, diagnosis, and plan of care documented in the resident's note. 

## 2017-02-07 ENCOUNTER — Encounter: Payer: Self-pay | Admitting: Dietician

## 2017-02-10 ENCOUNTER — Encounter: Payer: Self-pay | Admitting: Internal Medicine

## 2017-04-28 ENCOUNTER — Ambulatory Visit (INDEPENDENT_AMBULATORY_CARE_PROVIDER_SITE_OTHER): Payer: Self-pay | Admitting: Internal Medicine

## 2017-04-28 ENCOUNTER — Encounter (INDEPENDENT_AMBULATORY_CARE_PROVIDER_SITE_OTHER): Payer: Self-pay

## 2017-04-28 ENCOUNTER — Encounter: Payer: Self-pay | Admitting: Internal Medicine

## 2017-04-28 VITALS — BP 141/67 | HR 72 | Temp 98.2°F | Ht 66.0 in | Wt 254.2 lb

## 2017-04-28 DIAGNOSIS — E1142 Type 2 diabetes mellitus with diabetic polyneuropathy: Secondary | ICD-10-CM

## 2017-04-28 DIAGNOSIS — Z0000 Encounter for general adult medical examination without abnormal findings: Secondary | ICD-10-CM

## 2017-04-28 DIAGNOSIS — H9313 Tinnitus, bilateral: Secondary | ICD-10-CM

## 2017-04-28 DIAGNOSIS — R42 Dizziness and giddiness: Secondary | ICD-10-CM

## 2017-04-28 DIAGNOSIS — IMO0001 Reserved for inherently not codable concepts without codable children: Secondary | ICD-10-CM

## 2017-04-28 DIAGNOSIS — Z794 Long term (current) use of insulin: Secondary | ICD-10-CM

## 2017-04-28 DIAGNOSIS — E1165 Type 2 diabetes mellitus with hyperglycemia: Secondary | ICD-10-CM

## 2017-04-28 DIAGNOSIS — I10 Essential (primary) hypertension: Secondary | ICD-10-CM

## 2017-04-28 DIAGNOSIS — Z87891 Personal history of nicotine dependence: Secondary | ICD-10-CM

## 2017-04-28 DIAGNOSIS — X58XXXD Exposure to other specified factors, subsequent encounter: Secondary | ICD-10-CM

## 2017-04-28 DIAGNOSIS — IMO0002 Reserved for concepts with insufficient information to code with codable children: Secondary | ICD-10-CM

## 2017-04-28 DIAGNOSIS — S29019D Strain of muscle and tendon of unspecified wall of thorax, subsequent encounter: Secondary | ICD-10-CM

## 2017-04-28 DIAGNOSIS — S29012D Strain of muscle and tendon of back wall of thorax, subsequent encounter: Secondary | ICD-10-CM

## 2017-04-28 LAB — POCT GLYCOSYLATED HEMOGLOBIN (HGB A1C): HEMOGLOBIN A1C: 8.8

## 2017-04-28 LAB — GLUCOSE, CAPILLARY: Glucose-Capillary: 253 mg/dL — ABNORMAL HIGH (ref 65–99)

## 2017-04-28 MED ORDER — METFORMIN HCL ER 500 MG PO TB24
1000.0000 mg | ORAL_TABLET | Freq: Two times a day (BID) | ORAL | 1 refills | Status: DC
Start: 2017-04-28 — End: 2017-04-28

## 2017-04-28 MED ORDER — METFORMIN HCL ER 500 MG PO TB24: 1000.0000 mg | ORAL_TABLET | Freq: Two times a day (BID) | ORAL | 1 refills | Status: DC

## 2017-04-28 NOTE — Patient Instructions (Addendum)
For your diabetes, concentrate on avoiding sweets and taking your medicines every day.  Check your blood sugars first thing in the morning.  Take the insulin before breakfast and before dinner.  Start taking metformin 1000 mg twice per day.

## 2017-04-28 NOTE — Assessment & Plan Note (Signed)
Vertigo/dizziness mostly associated with positional changes, standing up, for past several months.  Also ringing in both ears, harder to hear conversation and TV.  Nonfocal neurologic exam, hearing intact to casual conversation, gait and vestibular function grossly normal.  Not BPPV, timecourse inconsistent with vestibular neuronitis or Meniere's.  Without insurance ENT, audiology, and vestibular rehab are not currently options, and Amy Norris symptoms seem to be relatively benign and chronic. -continue to monitor -Stand up slowly to avoid orthostasis

## 2017-04-28 NOTE — Assessment & Plan Note (Addendum)
Mammo referral today Defer colorectal cancer screening in uninsured patient unable to afford colonoscopy

## 2017-04-28 NOTE — Assessment & Plan Note (Addendum)
Lab Results  Component Value Date   HGBA1C 8.8 04/28/2017   HGBA1C 7.6 02/03/2017   HGBA1C 8.6 08/05/2016    Recent Labs  04/28/17 1326  GLUCAP 253*   Current medications: metformin 1000 mg daily Current insulin: Humalog 70/30 35U twice daily  Admits to more dietary indiscretions recently, including Little [employer]bbie snack cakes, Peeps, chocolate, icing, and cookies.  Thinks Easter and Valentines made controlling cravings harder.  Taking metformin and insulin 3-4x per week.  Not checking BGs regularly.  Assessment HgbA1c goal: <7.0 Glycemic control: uncontrolled, worsening Complications: polyneuropathy  Plan Medications: increase to 1000 BID Insulin: continue current insulin Other: -A1c today -discussed medication adherence, diet and weight loss -check fasting AM BGs at home -recommend adding meds to morning routine to increase compliance

## 2017-04-28 NOTE — Assessment & Plan Note (Addendum)
BP Readings from Last 3 Encounters:  04/28/17 (!) 141/67  02/03/17 (!) 133/99  10/07/16 135/72    Lab Results  Component Value Date   CREATININE 0.58 02/03/2017   Lab Results  Component Value Date   K 3.9 02/03/2017    Current medications: olmesartan 40 mg daily (increased 01/2017)  Assessment BP goal: <130/80 BP control: above goal  Plan  Medications:  Other: -discussed diet, exercise, and weight loss

## 2017-04-28 NOTE — Progress Notes (Signed)
   CC: "I've been bad, eating lots of sweets."  HPI:  Amy Norris is a 51 y.o. woman with history of DM2, HTN, and depression who presents for follow-up of diabetes.  Please see A&P for status of the patient's chronic medical conditions.  Past Medical History:  Diagnosis Date  . Anal fissure   . Breast mass   . Bronchitis   . Carpal tunnel syndrome, bilateral   . Cholelithiasis   . Depression   . Diabetes mellitus    Type II  . Essential hypertension   . GERD (gastroesophageal reflux disease)   . Herpes simplex type 1 infection    vaginal  . Hypokalemia    2/2 diarrhea  . Menorrhagia   . Obesity   . Polycystic ovarian disease   . Renal calculi   . Renal calculus   . Sleep apnea, obstructive     Review of Systems:   Review of Systems  Constitutional: Negative for chills and fever.  HENT: Positive for hearing loss and tinnitus. Negative for ear pain.   Respiratory: Negative for cough and wheezing.   Cardiovascular: Negative for chest pain and palpitations.  Neurological: Positive for dizziness. Negative for sensory change, focal weakness, loss of consciousness and headaches.   Physical Exam:  Vitals:   04/28/17 1321  BP: (!) 141/67  Pulse: 72  Temp: 98.2 F (36.8 C)  TempSrc: Oral  SpO2: 98%  Weight: 254 lb 3.2 oz (115.3 kg)  Height:  (1.676 m)   Physical Exam  Constitutional:  Obese woman in no distress  Cardiovascular: Normal rate and regular rhythm.   Pulmonary/Chest: Effort normal and breath sounds normal.  Neurological:  Alert and oriented CN intact Strength 5/5 throughout all extremities Sensation grossly intact to light touch Casual and tandem gait intact Romberg negative Dix-Hallpike negative    Assessment & Plan:   See Encounters Tab for problem based charting.  Patient discussed with Dr. Oswaldo Done

## 2017-04-28 NOTE — Assessment & Plan Note (Addendum)
Conservative management for left upper back pain with muscle relaxant and NSAIDs in early February.  Now asymptomatic.

## 2017-04-29 LAB — BMP8+ANION GAP
ANION GAP: 16 mmol/L (ref 10.0–18.0)
BUN/Creatinine Ratio: 21 (ref 9–23)
BUN: 12 mg/dL (ref 6–24)
CO2: 25 mmol/L (ref 18–29)
CREATININE: 0.58 mg/dL (ref 0.57–1.00)
Calcium: 9.2 mg/dL (ref 8.7–10.2)
Chloride: 99 mmol/L (ref 96–106)
GFR calc Af Amer: 124 mL/min/{1.73_m2} (ref >59)
GFR calc non Af Amer: 108 mL/min/{1.73_m2} (ref >59)
Glucose: 212 mg/dL — ABNORMAL HIGH (ref 65–99)
POTASSIUM: 3.9 mmol/L (ref 3.5–5.2)
Sodium: 140 mmol/L (ref 134–144)

## 2017-04-29 NOTE — Progress Notes (Signed)
Internal Medicine Clinic Attending  Case discussed with Dr. O'Sullivan at the time of the visit.  We reviewed the resident's history and exam and pertinent patient test results.  I agree with the assessment, diagnosis, and plan of care documented in the resident's note. 

## 2017-05-05 ENCOUNTER — Encounter: Payer: Self-pay | Admitting: Internal Medicine

## 2017-05-27 MED FILL — GABAPENTIN 300 MG CAPSULE: 300 | 30 days supply | Qty: 90 | Fill #1

## 2017-06-06 ENCOUNTER — Encounter: Payer: Self-pay | Admitting: *Deleted

## 2017-07-14 ENCOUNTER — Ambulatory Visit (INDEPENDENT_AMBULATORY_CARE_PROVIDER_SITE_OTHER): Payer: Self-pay | Admitting: Internal Medicine

## 2017-07-14 DIAGNOSIS — E1142 Type 2 diabetes mellitus with diabetic polyneuropathy: Secondary | ICD-10-CM

## 2017-07-14 DIAGNOSIS — Z0000 Encounter for general adult medical examination without abnormal findings: Secondary | ICD-10-CM

## 2017-07-14 DIAGNOSIS — Z794 Long term (current) use of insulin: Secondary | ICD-10-CM

## 2017-07-14 DIAGNOSIS — E7800 Pure hypercholesterolemia, unspecified: Secondary | ICD-10-CM

## 2017-07-14 DIAGNOSIS — Z79899 Other long term (current) drug therapy: Secondary | ICD-10-CM

## 2017-07-14 DIAGNOSIS — IMO0002 Reserved for concepts with insufficient information to code with codable children: Secondary | ICD-10-CM

## 2017-07-14 DIAGNOSIS — E1165 Type 2 diabetes mellitus with hyperglycemia: Principal | ICD-10-CM

## 2017-07-14 DIAGNOSIS — E118 Type 2 diabetes mellitus with unspecified complications: Secondary | ICD-10-CM

## 2017-07-14 DIAGNOSIS — Z23 Encounter for immunization: Secondary | ICD-10-CM

## 2017-07-14 LAB — POCT GLYCOSYLATED HEMOGLOBIN (HGB A1C): Hemoglobin A1C: 7.3

## 2017-07-14 LAB — GLUCOSE, CAPILLARY: Glucose-Capillary: 266 mg/dL — ABNORMAL HIGH (ref 65–99)

## 2017-07-14 MED ORDER — LOVASTATIN 40 MG PO TABS: 40.0000 mg | ORAL_TABLET | Freq: Every day | ORAL | 11 refills | Status: DC

## 2017-07-14 NOTE — Assessment & Plan Note (Signed)
Pt reports neuropathy well controlled for the most part with the occasional episode but not bothering her that much.  Will continue her current gabapentin dose with no changes.

## 2017-07-14 NOTE — Progress Notes (Signed)
   CC: Health Maintenance, Diabetes follow up  HPI:  Amy Norris is a 51 y.o. who comes in for a follow up visit to address her chronic medical conditions especially diabetes, she has not been measuring her glucose at home due to lack of strips.  She reports she has the prescription for these but has not picked them up due to finances.      Please see A&P for status of the patient's chronic medical conditions  Past Medical History:  Diagnosis Date  . Anal fissure   . Breast mass   . Bronchitis   . Carpal tunnel syndrome, bilateral   . Cholelithiasis   . Depression   . Diabetes mellitus    Type II  . Essential hypertension   . GERD (gastroesophageal reflux disease)   . Herpes simplex type 1 infection    vaginal  . Hypokalemia    2/2 diarrhea  . Menorrhagia   . Obesity   . Polycystic ovarian disease   . Renal calculi   . Renal calculus   . Sleep apnea, obstructive    Review of Systems:  Pt denies chest pain, shortness of breath, abdominal pain, nausea or vomiting.  Physical Exam:  Vitals:   07/14/17 1330  BP: 139/67  Pulse: 87  Temp: 98.2 F (36.8 C)  TempSrc: Oral  SpO2: 99%  Weight: 251 lb (113.9 kg)  Height: 5\' 6"  (1.676 m)   Physical Exam  Constitutional: She is well-developed, well-nourished, and in no distress. No distress.  HENT:  Head: Normocephalic and atraumatic.  Neck: No JVD present. No tracheal deviation present. No thyromegaly present.  Cardiovascular: Normal rate and regular rhythm.  Exam reveals no gallop and no friction rub.   No murmur heard. Pulmonary/Chest: No respiratory distress. She has no wheezes.  Abdominal: She exhibits no distension. There is no tenderness.  Musculoskeletal: She exhibits no edema or deformity.  Skin: She is not diaphoretic.    Assessment & Plan:   See Encounters Tab for problem based charting.  Patient seen with Dr. Rogelia BogaButcher

## 2017-07-14 NOTE — Assessment & Plan Note (Addendum)
Colonoscopy: pt doesn't want colonoscopy due to cost but is amenable to the FIT test Mammogram: scheduled August 2nd 2018 Lipid panel: ordered today PPSV23: given today  Eye exam: discussed with pt, pt agreed to make an eye appointment this year had an eye exam last year February.

## 2017-07-14 NOTE — Assessment & Plan Note (Addendum)
HbA1C 7.3 today, Pt taking metformin as prescribed and tolerating it well.  Her blood sugar was high on this visit but she mentioned she just drank a mountain dew.  I discussed drinking more water and switching to diet soda instead, that she should be very close to or at her goal.

## 2017-07-14 NOTE — Assessment & Plan Note (Signed)
Guidelines recommend moderate intensity statin based on risk.  Will increase lovastatin to 40mg 

## 2017-07-14 NOTE — Patient Instructions (Signed)
Please keep up the good work and try to switch to diet sodas.

## 2017-07-15 NOTE — Progress Notes (Signed)
Internal Medicine Clinic Attending  I saw and evaluated the patient.  I personally confirmed the key portions of the history and exam documented by Dr. Winfrey and I reviewed pertinent patient test results.  The assessment, diagnosis, and plan were formulated together and I agree with the documentation in the resident's note. 

## 2017-07-21 ENCOUNTER — Other Ambulatory Visit: Payer: Self-pay | Admitting: Obstetrics and Gynecology

## 2017-07-21 DIAGNOSIS — Z1231 Encounter for screening mammogram for malignant neoplasm of breast: Secondary | ICD-10-CM

## 2017-07-24 ENCOUNTER — Other Ambulatory Visit: Payer: Self-pay | Admitting: *Deleted

## 2017-07-24 DIAGNOSIS — I10 Essential (primary) hypertension: Secondary | ICD-10-CM

## 2017-07-24 DIAGNOSIS — F3340 Major depressive disorder, recurrent, in remission, unspecified: Secondary | ICD-10-CM

## 2017-07-24 MED ORDER — AZILSARTAN MEDOXOMIL 80 MG PO TABS: 80.0000 mg | ORAL_TABLET | Freq: Every day | ORAL | 5 refills | Status: DC

## 2017-07-24 MED ORDER — FLUOXETINE HCL 40 MG PO CAPS: 80.0000 mg | ORAL_CAPSULE | Freq: Every day | ORAL | 5 refills | Status: DC

## 2017-07-24 NOTE — Telephone Encounter (Signed)
Sent Rx for fluoxetine and changed ARB to azilsartan as requested.  Please let me know if there are any issues.

## 2017-07-31 ENCOUNTER — Encounter (HOSPITAL_COMMUNITY): Payer: Self-pay

## 2017-07-31 ENCOUNTER — Ambulatory Visit (HOSPITAL_COMMUNITY)
Admission: RE | Admit: 2017-07-31 | Discharge: 2017-07-31 | Disposition: A | Discharge status: Home / Self Care | Payer: Self-pay | Source: Ambulatory Visit | Attending: Obstetrics and Gynecology | Admitting: Obstetrics and Gynecology

## 2017-07-31 ENCOUNTER — Ambulatory Visit
Admission: RE | Admit: 2017-07-31 | Discharge: 2017-07-31 | Disposition: A | Discharge status: Home / Self Care | Payer: No Typology Code available for payment source | Source: Ambulatory Visit | Attending: Obstetrics and Gynecology | Admitting: Obstetrics and Gynecology

## 2017-07-31 VITALS — BP 135/68 | HR 74 | Ht 66.0 in | Wt 256.0 lb

## 2017-07-31 DIAGNOSIS — Z1239 Encounter for other screening for malignant neoplasm of breast: Secondary | ICD-10-CM

## 2017-07-31 DIAGNOSIS — Z1231 Encounter for screening mammogram for malignant neoplasm of breast: Secondary | ICD-10-CM

## 2017-07-31 NOTE — Patient Instructions (Signed)
Explained breast self awareness with Amy Norris. Patient did not need a Pap smear today due to last Pap smear was 10/07/2016.  Let her know [facility] will cover Pap smears every 3 years unless has a history of abnormal Pap smears. [facility]. Appointment scheduled for Thursday, July 31, 2017 at 1510. Let patient know the Breast Center will follow up with her within the next couple weeks with results of mammogram by letter or phone. Amy Norris verbalized understanding.  [place]annock, Kathaleen Maserhristine Poll, RN 3:17 PM

## 2017-07-31 NOTE — Progress Notes (Signed)
No complaints today.   Pap Smear: Pap smear not completed today. Last [facility] and normal. Per patient has no history of an abnormal Pap smear. Last Pap smear result is in EPIC.  Physical exam: Breasts Breasts symmetrical. No skin abnormalities bilateral breasts. No nipple retraction bilateral breasts. No nipple discharge bilateral breasts. No lymphadenopathy. No lumps palpated bilateral breasts. No complaints of pain or tenderness on exam. [facility] for a screening mammogram. Appointment scheduled for Thursday, July 31, 2017 at 1510.       Pelvic/Bimanual No Pap smear completed today since last Pap smear was 10/07/2016. Pap smear not indicated per [facility] guidelines.   Smoking History: Patient is a former smoker. Patient quit 12/30/1978.  Patient Navigation: Patient education provided. Access to services provided for patient through [facility] program.   Colorectal Cancer Screening: Per patient has never had a colonoscopy completed. No complaints today. FIT Test given to patient to complete and return to [facility].

## 2017-09-10 ENCOUNTER — Other Ambulatory Visit: Payer: Self-pay | Admitting: *Deleted

## 2017-09-10 DIAGNOSIS — E1142 Type 2 diabetes mellitus with diabetic polyneuropathy: Secondary | ICD-10-CM

## 2017-09-11 MED ORDER — METFORMIN HCL ER (MOD) 1000 MG PO TB24: 1000.0000 mg | ORAL_TABLET | Freq: Two times a day (BID) | ORAL | 3 refills | Status: DC

## 2017-09-16 ENCOUNTER — Other Ambulatory Visit: Payer: Self-pay | Admitting: *Deleted

## 2017-09-16 MED ORDER — METFORMIN HCL ER (MOD) 1000 MG PO TB24: 1000.0000 mg | ORAL_TABLET | Freq: Every day | ORAL | 0 refills | Status: DC

## 2017-09-16 NOTE — Telephone Encounter (Signed)
Fax from [employer] - states pt's co-pay for Metformin 1000 mg 24hr tab is $1000.00; pt has been on ER 500 mg. Please change and send new rx if appropriate. Thanks

## 2017-10-28 ENCOUNTER — Encounter (INDEPENDENT_AMBULATORY_CARE_PROVIDER_SITE_OTHER): Payer: Self-pay

## 2017-10-28 ENCOUNTER — Encounter: Payer: Self-pay | Admitting: Internal Medicine

## 2017-10-28 ENCOUNTER — Ambulatory Visit (INDEPENDENT_AMBULATORY_CARE_PROVIDER_SITE_OTHER): Payer: Self-pay | Admitting: Internal Medicine

## 2017-10-28 DIAGNOSIS — Y92512 Supermarket, store or market as the place of occurrence of the external cause: Secondary | ICD-10-CM

## 2017-10-28 DIAGNOSIS — L84 Corns and callosities: Secondary | ICD-10-CM

## 2017-10-28 DIAGNOSIS — I10 Essential (primary) hypertension: Secondary | ICD-10-CM

## 2017-10-28 DIAGNOSIS — S90211A Contusion of right great toe with damage to nail, initial encounter: Secondary | ICD-10-CM

## 2017-10-28 DIAGNOSIS — Y990 Civilian activity done for income or pay: Secondary | ICD-10-CM

## 2017-10-28 DIAGNOSIS — E119 Type 2 diabetes mellitus without complications: Secondary | ICD-10-CM

## 2017-10-28 DIAGNOSIS — W208XXA Other cause of strike by thrown, projected or falling object, initial encounter: Secondary | ICD-10-CM

## 2017-10-28 DIAGNOSIS — Z87891 Personal history of nicotine dependence: Secondary | ICD-10-CM

## 2017-10-28 DIAGNOSIS — E785 Hyperlipidemia, unspecified: Secondary | ICD-10-CM

## 2017-10-28 NOTE — Progress Notes (Signed)
CC: Right big toe injury  HPI:  Amy Norris is a 51 y.o. female with PMH as listed below including HTN, T2DM, HLD who presents for evaluation of a right big toe injury.  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 She says on 10/22/17 evening she was stacking wooden pallets when one slid off and fell on her right first toe. She had pain on the first day and noticed a bluish discoloration of her toenail. She did take progression pictures which showed subsequent proximal spread of bruising/hematoma past the nail bed. She has 4/10 pain today when she moves her toe. She reports decreased sensation but can feel pressure at her right first toe. She noticed that her blood blister felt more firm this morning. She has tried putting vinegar and peroxide on her toe for disinfection. She thought about poking a needle into her hematoma for relief, but decided against this. She is able to walk on her own. She wears regular sneakers at work.  Past Medical History:  Diagnosis Date  . Anal fissure   . Breast mass   . Bronchitis   . Carpal tunnel syndrome, bilateral   . Cholelithiasis   . Depression   . Diabetes mellitus    Type II  . Essential hypertension   . GERD (gastroesophageal reflux disease)   . Herpes simplex type 1 infection    vaginal  . Hypokalemia    2/2 diarrhea  . Menorrhagia   . Obesity   . Polycystic ovarian disease   . Renal calculi   . Renal calculus   . Sleep apnea, obstructive    Review of Systems:   Review of Systems  Musculoskeletal: Negative for falls.       4/10 right big toe pain.  Skin:       Bruising right toe nail  Neurological: Negative for tingling.       Decreased sensation distal right big toe     Physical Exam:  Vitals:   10/28/17 0901  BP: (!) 143/73  Pulse: 75  Temp: 97.9 F (36.6 C)  TempSrc: Oral  SpO2: 98%  Weight: 254 lb 14.4 oz (115.6 kg)  Height: 5\' 6"   (1.676 m)   Physical Exam  Constitutional: She is oriented to person, place, and time. She appears well-developed and well-nourished. No distress.  HENT:  Head: Normocephalic and atraumatic.  Cardiovascular: Normal rate.   Pulses:      Dorsalis pedis pulses are 2+ on the right side.       Posterior tibial pulses are 2+ on the right side.  Musculoskeletal:  Subungual hematoma right first toe. Dark discoloration of bruising beneath toenail with proximal extension of hematoma beneath the cuticle which is semi-fluctuant otherwise firm and black in discoloration. She can flex and extend her right first toe as well as the 2nd-5th toes on her right foot. Calluses medial aspect of right foot. Ambulating without assist.  Neurological: She is alert and oriented to person, place, and time.  Right distal 1st toe with decreased sensation to sharp touch, sensation of pressure is intact.  Skin: She is not diaphoretic.      Assessment & Plan:   See Encounters Tab for problem based charting.  Patient discussed with Dr. Rogelia Boga  Subungual hematoma of great toe of right foot Patient with subungual hematoma of her right great toe after a traumatic injury (wooden pallet fell on toe). She has had  progressive proximal spread of her hematoma suggesting that bleeding has not fully clotted at this time. ROM of her right 1st toe is intact but limited to pain. She does report some numbness to sharp touch with intact sensation to pressure. I have less suspicion for bony fracture or ischemia of her toe. She has good distal pulses with warm digits. Given progression of her hematoma, she may benefit from nail trephination for relief of pressure if her bleeding has not yet clotted. - Refer to Podiatry for further evaluation - Recommend wearing steel-toed boots at work

## 2017-10-28 NOTE — Assessment & Plan Note (Addendum)
Patient with subungual hematoma of her right great toe after a traumatic injury (wooden pallet fell on toe). She has had progressive proximal spread of her hematoma suggesting that bleeding has not fully clotted at this time. ROM of her right 1st toe is intact but limited to pain. She does report some numbness to sharp touch with intact sensation to pressure. I have less suspicion for bony fracture or ischemia of her toe. She has good distal pulses with warm digits. Given progression of her hematoma, she may benefit from nail trephination for relief of pressure if her bleeding has not yet clotted. - Refer to Podiatry for further evaluation - Recommend wearing steel-toed boots at work

## 2017-10-28 NOTE — Patient Instructions (Addendum)
It was pleasure to see you Amy Norris.  I am sorry about your toe injury.  I am going to refer you to a foot specialist for further evaluation and management.  Please try to obtain steel-toed boots to wear for work.  Please follow up with Dr. Frances FurbishWinfrey in 4-6 weeks or see us sooner if needed.

## 2017-10-30 ENCOUNTER — Telehealth: Payer: Self-pay | Admitting: *Deleted

## 2017-10-30 NOTE — Telephone Encounter (Signed)
CALLED PATIENT. LVM FOR PATIENT REGARDING HER FOOT REFERRAL. NO COVERAGE, PATIENT WOULD HAVE TO PAY OUT OF POCKET FOR SERVICE IF WILLING TO GET APPOINTMENT.

## 2017-11-02 ENCOUNTER — Encounter (HOSPITAL_COMMUNITY): Payer: Self-pay | Admitting: Emergency Medicine

## 2017-11-02 ENCOUNTER — Emergency Department (HOSPITAL_COMMUNITY)
Admission: EM | Admit: 2017-11-02 | Discharge: 2017-11-02 | Disposition: A | Discharge status: Home / Self Care | Payer: Self-pay | Attending: Emergency Medicine | Admitting: Emergency Medicine

## 2017-11-02 DIAGNOSIS — K0889 Other specified disorders of teeth and supporting structures: Secondary | ICD-10-CM | POA: Insufficient documentation

## 2017-11-02 DIAGNOSIS — E1142 Type 2 diabetes mellitus with diabetic polyneuropathy: Secondary | ICD-10-CM | POA: Insufficient documentation

## 2017-11-02 DIAGNOSIS — Z885 Allergy status to narcotic agent status: Secondary | ICD-10-CM | POA: Insufficient documentation

## 2017-11-02 DIAGNOSIS — I10 Essential (primary) hypertension: Secondary | ICD-10-CM | POA: Insufficient documentation

## 2017-11-02 DIAGNOSIS — R739 Hyperglycemia, unspecified: Secondary | ICD-10-CM | POA: Insufficient documentation

## 2017-11-02 DIAGNOSIS — Z87891 Personal history of nicotine dependence: Secondary | ICD-10-CM | POA: Insufficient documentation

## 2017-11-02 DIAGNOSIS — Z794 Long term (current) use of insulin: Secondary | ICD-10-CM | POA: Insufficient documentation

## 2017-11-02 DIAGNOSIS — R600 Localized edema: Secondary | ICD-10-CM | POA: Insufficient documentation

## 2017-11-02 DIAGNOSIS — Z79899 Other long term (current) drug therapy: Secondary | ICD-10-CM | POA: Insufficient documentation

## 2017-11-02 LAB — CBG MONITORING, ED: GLUCOSE-CAPILLARY: 234 mg/dL — AB (ref 65–99)

## 2017-11-02 MED ORDER — ACETAMINOPHEN 325 MG PO TABS
650.0000 mg | ORAL_TABLET | Freq: Once | ORAL | Status: AC
  Administered 2017-11-02: 650 mg via ORAL
  Filled 2017-11-02: qty 2

## 2017-11-02 MED ORDER — PENICILLIN V POTASSIUM 250 MG PO TABS
500.0000 mg | ORAL_TABLET | Freq: Once | ORAL | Status: AC
  Administered 2017-11-02: 500 mg via ORAL
  Filled 2017-11-02: qty 2

## 2017-11-02 MED ORDER — PENICILLIN V POTASSIUM 500 MG PO TABS: 500.0000 mg | ORAL_TABLET | Freq: Three times a day (TID) | ORAL | 0 refills | Status: AC

## 2017-11-02 NOTE — ED Triage Notes (Signed)
Pt. Stated, I have a broken tooth and it flared up last night. Rt. Side of face swollen

## 2017-11-02 NOTE — ED Notes (Signed)
Pt staets she understands instruction and will follow up as directed. Understands instructions toi return. Hiome stable with steaDY GAIT.

## 2017-11-02 NOTE — Discharge Instructions (Signed)
Take Tylenol every 4 hours as directed for pain.  Take the antibiotic prescribed as directed .call Dr. Norris Crossurner's office tomorrow to schedule the next available appointment.  Tell office staff that you were seen here.  You can also call any of the numbers on the resource guide to get help with your dental problem.  Blood sugar today was mildly elevated at 234.  Take your diabetic medication as prescribed.  Your blood pressure was slightly high at 147/75.  Blood pressure should be rechecked in 3 weeks.

## 2017-11-02 NOTE — ED Provider Notes (Addendum)
[facility] EMERGENCY DEPARTMENT Provider Note   CSN: [id number] Arrival date & time: 11/02/17  1043     History   Chief Complaint Chief Complaint  Patient presents with  . Dental Pain  . Facial Swelling    HPI Amy Norris is a 51 y.o. female.  HPI She reports that 1 of her teeth broke 4 months ago.  She developed pain and swelling at the site of the broken tooth and a right face yesterday.  No trismus no fever no vomiting.  Treated with over-the-counter pain medicine which he does not recall at 12:30 AM today with partial relief.  No other associated symptoms.  She has not seen a dentist in several years. Past Medical History:  Diagnosis Date  . Anal fissure   . Breast mass   . Bronchitis   . Carpal tunnel syndrome, bilateral   . Cholelithiasis   . Depression   . Diabetes mellitus    Type II  . Essential hypertension   . GERD (gastroesophageal reflux disease)   . Herpes simplex type 1 infection    vaginal  . Hypokalemia    2/2 diarrhea  . Menorrhagia   . Obesity   . Polycystic ovarian disease   . Renal calculi   . Renal calculus   . Sleep apnea, obstructive     Patient Active Problem List   Diagnosis Date Noted  . Subungual hematoma of great toe of right foot 10/28/2017  . Diabetic polyneuropathy (HCC) 02/02/2017  . Dizziness 11/29/2013  . Hypercholesteremia 11/05/2012  . Preventative health care 02/14/2011  . ESSENTIAL HYPERTENSION, BENIGN 01/14/2008  . Type 2 diabetes mellitus with peripheral neuropathy (HCC) 12/04/2006  . Depression, major, recurrent, in remission (HCC) 12/04/2006  . SLEEP APNEA, OBSTRUCTIVE 12/04/2006  . GERD 12/04/2006    Past Surgical History:  Procedure Laterality Date  . CHOLECYSTECTOMY  11/08  . TUBAL LIGATION  1991   1991    OB History    No data available       Home Medications    Prior to Admission medications   Medication Sig Start Date End Date Taking? Authorizing Provider  Azilsartan  Medoxomil (EDARBI) 80 MG TABS Take 1 tablet (80 mg total) by mouth daily. 07/24/17   Inez Catalina, MD  benzonatate (TESSALON) 100 MG capsule TAKE ONE CAPSULE BY MOUTH THREE TIMES DAILY AS NEEDED FOR COUGH 01/17/17   Alm Bustard, MD  FLUoxetine (PROZAC) 40 MG capsule Take 2 capsules (80 mg total) by mouth daily. 07/24/17   Inez Catalina, MD  gabapentin (NEURONTIN) 300 MG capsule Take 1 capsule (300 mg total) by mouth 3 (three) times daily. 02/03/17 02/03/18  Alm Bustard, MD  glucose blood test strip Use to check blood sugar 3 to 4 times daily. Diag code E11.65. Insulin dependent 01/30/15 11/11/17  Courtney Paris, MD  Insulin Isophane & Regular Human (HUMULIN 70/30 KWIKPEN) (70-30) 100 UNIT/ML PEN Inject 35 Units into the skin 2 (two) times daily with a meal. 02/03/17   Alm Bustard, MD  Insulin Pen Needle 32G X 6 MM MISC Please use to inject insulin twice daily. ICD 10: E11.65 05/07/16   Courtney Paris, MD  loratadine (CLARITIN) 10 MG tablet Take 1 tablet (10 mg total) by mouth daily. 08/11/15 08/10/16  Marrian Salvage, MD  lovastatin (MEVACOR) 40 MG tablet Take 1 tablet (40 mg total) by mouth daily. 07/14/17 07/14/18  Angelita Ingles, MD  metFORMIN (GLUMETZA) 1000 MG (MOD) 24  hr tablet Take 1 tablet (1,000 mg total) by mouth daily with breakfast. 09/16/17   Earl LagosNarendra, Nischal, MD    Family History Family History  Problem Relation Age of Onset  . Hypertension Mother   . Diabetes Father     Social History Social History   Tobacco Use  . Smoking status: Former Smoker    Types: Cigarettes    Last attempt to quit: 12/30/1978    Years since quitting: 38.8  . Smokeless tobacco: Never Used  Substance Use Topics  . Alcohol use: No    Alcohol/week: 0.0 oz  . Drug use: No     Allergies   Vicodin [hydrocodone-acetaminophen] and Metformin and related   Review of Systems Review of Systems  Constitutional: Negative.   HENT: Positive for dental problem and facial swelling.     Respiratory: Negative.   Allergic/Immunologic: Positive for immunocompromised state.     Physical Exam Updated Vital Signs LMP 10/11/2017   Physical Exam  Constitutional: She is oriented to person, place, and time. She appears well-developed and well-nourished. No distress.  Alert handling secretions well.  No distress  HENT:  Generally poor dentition.  Fractured, decayed tooth #5.  Several teeth missing.  Widespread dental decay.  No trismus.  No tenderness at submandibular area.  Oropharynx normal.  Right cheek minimally swollen.  Nontender.  No fluctuance or swelling of gingiva  Eyes: EOM are normal. Pupils are equal, round, and reactive to light. Left eye exhibits no discharge.  Neck: Neck supple.  Cardiovascular: Normal rate.  Pulmonary/Chest: Effort normal and breath sounds normal.  Abdominal:  Obese  Musculoskeletal: Normal range of motion.  Lymphadenopathy:    She has no cervical adenopathy.  Neurological: She is alert and oriented to person, place, and time. She displays normal reflexes. No cranial nerve deficit.  Psychiatric: She has a normal mood and affect.  Nursing note and vitals reviewed.    ED Treatments / Results  Labs (all labs ordered are listed, but only abnormal results are displayed) Labs Reviewed - No data to display  EKG  EKG Interpretation None      Results for orders placed or performed during the hospital encounter of 11/02/17  CBG monitoring, ED  Result Value Ref Range   Glucose-Capillary 234 (H) 65 - 99 mg/dL   No results found.  Radiology No results found.  Procedures Procedures (including critical care time)  Medications Ordered in ED Medications - No data to display   Initial Impression / Assessment and Plan / ED Course  I have reviewed the triage vital signs and the nursing notes.  Pertinent labs & imaging results that were available during my care of the patient were reviewed by me and considered in my medical decision  making (see chart for details).     Patient did not take her diabetic medication this morning.  She is advised to take it as prescribed.  Plan Tylenol for pain, prescription Pen-Vee K, referral Dr. Mayford Knifeurner from dentistry.  She will also get resource guide for dentistry.  Suggest blood pressure recheck 3 weeks.  There is no obvious abscess to drain in the ED  Final Clinical Impressions(s) / ED Diagnoses  Diagnoses #1 dental pain #2 elevated blood pressure #3 hyperglycemia Final diagnoses:  None    New Prescriptions This SmartLink is deprecated. Use AVSMEDLIST instead to display the medication list for a patient.   Doug SouJacubowitz, Olamide Carattini, MD 11/02/17 1241    Doug SouJacubowitz, Makai Dumond, MD 11/02/17 1246

## 2017-11-03 NOTE — Progress Notes (Signed)
Internal Medicine Clinic Attending  Case discussed with Dr. Patel at the time of the visit.  We reviewed the resident's history and exam and pertinent patient test results.  I agree with the assessment, diagnosis, and plan of care documented in the resident's note.  

## 2017-12-01 ENCOUNTER — Ambulatory Visit: Payer: Self-pay | Admitting: Internal Medicine

## 2017-12-01 ENCOUNTER — Ambulatory Visit: Payer: Self-pay | Admitting: Dietician

## 2017-12-01 ENCOUNTER — Encounter: Payer: Self-pay | Admitting: Internal Medicine

## 2017-12-01 ENCOUNTER — Encounter: Payer: Self-pay | Admitting: Dietician

## 2017-12-01 ENCOUNTER — Encounter (INDEPENDENT_AMBULATORY_CARE_PROVIDER_SITE_OTHER): Payer: Self-pay

## 2017-12-01 VITALS — BP 141/61 | HR 75 | Temp 98.2°F | Ht 66.0 in | Wt 258.5 lb

## 2017-12-01 DIAGNOSIS — Z8249 Family history of ischemic heart disease and other diseases of the circulatory system: Secondary | ICD-10-CM

## 2017-12-01 DIAGNOSIS — Z0000 Encounter for general adult medical examination without abnormal findings: Secondary | ICD-10-CM

## 2017-12-01 DIAGNOSIS — Z87891 Personal history of nicotine dependence: Secondary | ICD-10-CM

## 2017-12-01 DIAGNOSIS — E1142 Type 2 diabetes mellitus with diabetic polyneuropathy: Secondary | ICD-10-CM

## 2017-12-01 DIAGNOSIS — Z833 Family history of diabetes mellitus: Secondary | ICD-10-CM

## 2017-12-01 DIAGNOSIS — I10 Essential (primary) hypertension: Secondary | ICD-10-CM

## 2017-12-01 DIAGNOSIS — Z794 Long term (current) use of insulin: Secondary | ICD-10-CM

## 2017-12-01 DIAGNOSIS — Z23 Encounter for immunization: Secondary | ICD-10-CM

## 2017-12-01 DIAGNOSIS — E785 Hyperlipidemia, unspecified: Secondary | ICD-10-CM

## 2017-12-01 LAB — GLUCOSE, CAPILLARY: Glucose-Capillary: 273 mg/dL — ABNORMAL HIGH (ref 65–99)

## 2017-12-01 LAB — POCT GLYCOSYLATED HEMOGLOBIN (HGB A1C): Hemoglobin A1C: 8.8

## 2017-12-01 MED ORDER — LISINOPRIL 10 MG PO TABS: 10.0000 mg | ORAL_TABLET | Freq: Every day | ORAL | 1 refills | Status: DC

## 2017-12-01 NOTE — Progress Notes (Signed)
CC: Here to follow up on T2DM, HTN,  and address health maintenance  HPI:  [name] is a 51 y.o. female with PMH below here to follow up on T2DM, HTN and HLD.  She has no specific complaints.  She continues to not take her blood sugars at home, mentions she does not have any strips.  She expresses interest in getting a CGM today.     Please see A&P for status of the patient's chronic medical conditions  Past Medical History:  Diagnosis Date  . Anal fissure   . Breast mass   . Bronchitis   . Carpal tunnel syndrome, bilateral   . Cholelithiasis   . Depression   . Diabetes mellitus    Type II  . Essential hypertension   . GERD (gastroesophageal reflux disease)   . Herpes simplex type 1 infection    vaginal  . Hypokalemia    2/2 diarrhea  . Menorrhagia   . Obesity   . Polycystic ovarian disease   . Renal calculi   . Renal calculus   . Sleep apnea, obstructive    Review of Systems:  ROS: Pulmonary: pt denies increased work of breathing, shortness of breath,  Cardiac: pt denies palpitations, chest pain,  Abdominal: pt denies abdominal pain, nausea, vomiting, or diarrhea  Physical Exam:  Vitals:   12/01/17 1502  BP: (!) 141/61  Pulse: 75  Temp: 98.2 F (36.8 C)  TempSrc: Oral  SpO2: 98%  Weight: 258 lb 8 oz (117.3 kg)  Height: 5\' 6"  (1.676 m)   Physical Exam  Constitutional: No distress.  Cardiovascular: Normal rate, regular rhythm and normal heart sounds. Exam reveals no gallop and no friction rub.  No murmur heard. Pulmonary/Chest: Effort normal and breath sounds normal. No respiratory distress. She has no wheezes. She has no rales. She exhibits no tenderness.  Abdominal: Soft. Bowel sounds are normal. She exhibits no distension and no mass. There is no tenderness. There is no rebound and no guarding.  Neurological: She is alert.  Skin: She is not diaphoretic.    Social History   Socioeconomic History  . Marital status: Married    Spouse name:  Not on file  . Number of children: Not on file  . Years of education: Not on file  . Highest education level: Not on file  Social Needs  . Financial resource strain: Not on file  . Food insecurity - worry: Not on file  . Food insecurity - inability: Not on file  . Transportation needs - medical: Not on file  . Transportation needs - non-medical: Not on file  Occupational History  . Occupation: Deli    Employer: [employer]  Tobacco Use  . Smoking status: Former Smoker    Types: Cigarettes    Last attempt to quit: 12/30/1978    Years since quitting: 38.9  . Smokeless tobacco: Never Used  Substance and Sexual Activity  . Alcohol use: No    Alcohol/week: 0.0 oz  . Drug use: No  . Sexual activity: No  Other Topics Concern  . Not on file  Social History Narrative    Smoked many years ago drinks 1-2 beers on Friday and Saturdays no illegal drug use. Married and lives with her husband and sister-in-law son daughter and grandson.   Financial assistance approved for [facility] and [facility] per Rudell Cobbeborah [place] as of August 06, 2010 6:27 PM    Family History  Problem Relation Age  of Onset  . Hypertension Mother   . Diabetes Father     Assessment & Plan:   See Encounters Tab for problem based charting.  Patient seen with Dr. Oswaldo DoneVincent

## 2017-12-01 NOTE — Patient Instructions (Signed)
Good to see you today.  Your A1C was a little elevated at 8.8%.  I think it's reasonable to try the continuous glucose monitor to see how it has been doing at home.  From there we can adjust your diabetes medications if need be.  We will start you on a blood pressure medication called lisinopril at 10mg  daily.  When you come in to follow up for the continuous glucose monitor we will check your kidney function and potassium levels after starting this medication.

## 2017-12-01 NOTE — Progress Notes (Signed)
Freestyle Libre Pro CGM sensor placed and started. Patient was educated about wearing sensor, keeping food, activity and medication log and when to call office. Follow up was arranged with the patient.  [employer], Lupita LeashDonna, RD 12/01/2017 6:46 PM.

## 2017-12-02 LAB — MICROALBUMIN / CREATININE URINE RATIO
Creatinine, Urine: 55.8 mg/dL
MICROALB/CREAT RATIO: 6.6 mg/g{creat} (ref 0.0–30.0)
Microalbumin, Urine: 3.7 ug/mL

## 2017-12-04 NOTE — Assessment & Plan Note (Addendum)
Lab Results  Component Value Date   HGBA1C 8.8 12/01/2017   A1C was up 1.5% today.  She has been below 8 several times this year with her lowest value being 7.4.  My goal for her is 7 this may be difficult given she has no insurance and I have limited medication options for her.  She is interested in getting the CGM today so we can try to adjust her current regimen.  Currently she is taking metformin 1000mg  daily and 35 units of 70/30 BID with meals.    -CGM placed today, follow up appointment in 1 and 2 weeks -will follow up next visit, check bmp as pt started on lisinopril

## 2017-12-04 NOTE — Assessment & Plan Note (Addendum)
BP Readings from Last 3 Encounters:  12/01/17 (!) 141/61  11/02/17 135/73  10/28/17 (!) 143/73   Pt has been on ARB and ACE therapy in the past.  She has not been taking her last prescribed ARB Edarbi.  She was unaware of this medication.    -Will start pt on lisinopril 10mg , she has tolerated this medication well in the past, will check BMP when pt follows up for CGM -She gets her medications through the health department assistance program and this is one of their available blood pressure meds

## 2017-12-05 ENCOUNTER — Ambulatory Visit: Payer: Self-pay

## 2017-12-05 ENCOUNTER — Ambulatory Visit (INDEPENDENT_AMBULATORY_CARE_PROVIDER_SITE_OTHER): Payer: Worker's Compensation | Admitting: Podiatry

## 2017-12-05 ENCOUNTER — Encounter: Payer: Self-pay | Admitting: Podiatry

## 2017-12-05 VITALS — BP 139/82 | HR 77

## 2017-12-05 DIAGNOSIS — S9030XS Contusion of unspecified foot, sequela: Secondary | ICD-10-CM | POA: Diagnosis not present

## 2017-12-05 DIAGNOSIS — S99921S Unspecified injury of right foot, sequela: Secondary | ICD-10-CM

## 2017-12-05 NOTE — Patient Instructions (Signed)

## 2017-12-05 NOTE — Progress Notes (Signed)
Internal Medicine Clinic Attending  I saw and evaluated the patient.  I personally confirmed the key portions of the history and exam documented by Dr. Winfrey and I reviewed pertinent patient test results.  The assessment, diagnosis, and plan were formulated together and I agree with the documentation in the resident's note. 

## 2017-12-05 NOTE — Progress Notes (Signed)
Subjective:    Patient ID: Amy Norris, female    DOB: [birth year], 51 y.o.   MRN: [id number]  HPI [name] presents to the office due to a Worker's Compensation claim and to check her right big toe.  About 2 months ago she states that she dropped a wooden pallet on her toe and the toenail did come off.  She did have x-rays taken which did not reveal a fracture of the nail has come off insert the grow back in.  She has some occasional discomfort but she is having no pain today.  She denies any drainage or pus.  The toe is been somewhat red since the injury but is been getting somewhat better.  She denies any red streaks.  Denies any warmth or increase in swelling recently.  Her last A1c was 8.8.  She has no other concerns today.  She does have neuropathy.   Review of Systems  All other systems reviewed and are negative.  Past Medical History:  Diagnosis Date  . Anal fissure   . Breast mass   . Bronchitis   . Carpal tunnel syndrome, bilateral   . Cholelithiasis   . Depression   . Diabetes mellitus    Type II  . Essential hypertension   . GERD (gastroesophageal reflux disease)   . Herpes simplex type 1 infection    vaginal  . Hypokalemia    2/2 diarrhea  . Menorrhagia   . Obesity   . Polycystic ovarian disease   . Renal calculi   . Renal calculus   . Sleep apnea, obstructive     Past Surgical History:  Procedure Laterality Date  . CHOLECYSTECTOMY  11/08  . TUBAL LIGATION  1991   1991     Current Outpatient Medications:  .  benzonatate (TESSALON) 100 MG capsule, TAKE ONE CAPSULE BY MOUTH THREE TIMES DAILY AS NEEDED FOR COUGH, Disp: 30 capsule, Rfl: 1 .  FLUoxetine (PROZAC) 40 MG capsule, Take 2 capsules (80 mg total) by mouth daily., Disp: 60 capsule, Rfl: 5 .  gabapentin (NEURONTIN) 300 MG capsule, Take 1 capsule (300 mg total) by mouth 3 (three) times daily., Disp: 270 capsule, Rfl: 3 .  Insulin Pen Needle 32G X 6 MM MISC, Please use to inject insulin twice daily.  ICD 10: E11.65, Disp: 100 each, Rfl: 11 .  lisinopril (PRINIVIL,ZESTRIL) 10 MG tablet, Take 1 tablet (10 mg total) by mouth daily., Disp: 90 tablet, Rfl: 1 .  lovastatin (MEVACOR) 40 MG tablet, Take 1 tablet (40 mg total) by mouth daily., Disp: 30 tablet, Rfl: 11 .  metFORMIN (GLUMETZA) 1000 MG (MOD) 24 hr tablet, Take 1 tablet (1,000 mg total) by mouth daily with breakfast., Disp: 90 tablet, Rfl: 0 .  glucose blood test strip, Use to check blood sugar 3 to 4 times daily. Diag code E11.65. Insulin dependent, Disp: 100 each, Rfl: 12 .  Insulin Isophane & Regular Human (HUMULIN 70/30 KWIKPEN) (70-30) 100 UNIT/ML PEN, Inject 40 Units into the skin every morning AND 45 Units every evening., Disp: 15 mL, Rfl: 11 .  loratadine (CLARITIN) 10 MG tablet, Take 1 tablet (10 mg total) by mouth daily., Disp: 30 tablet, Rfl: 1  Allergies  Allergen Reactions  . Vicodin [Hydrocodone-Acetaminophen] Itching    Patient states that she can take this medication. Must take with benadryl.  . Metformin And Related Nausea And Vomiting    Patient states that she can take this medication    Social History  Socioeconomic History  . Marital status: Married    Spouse name: Not on file  . Number of children: Not on file  . Years of education: Not on file  . Highest education level: Not on file  Social Needs  . Financial resource strain: Not on file  . Food insecurity - worry: Not on file  . Food insecurity - inability: Not on file  . Transportation needs - medical: Not on file  . Transportation needs - non-medical: Not on file  Occupational History  . Occupation: Deli    Employer: [employer]  Tobacco Use  . Smoking status: Former Smoker    Types: Cigarettes    Last attempt to quit: 12/30/1978    Years since quitting: 38.9  . Smokeless tobacco: Never Used  Substance and Sexual Activity  . Alcohol use: No    Alcohol/week: 0.0 oz  . Drug use: No  . Sexual activity: No  Other Topics Concern  . Not on  file  Social History Narrative    Smoked many years ago drinks 1-2 beers on Friday and Saturdays no illegal drug use. Married and lives with her husband and sister-in-law son daughter and grandson.   Financial assistance [facility] and [facility] Rudell Cobbeborah Hill as of August 06, 2010 6:27 PM        Objective:   Physical Exam General: AAO x3, NAD  Dermatological: No toenails present the right hallux.  Callus, dry skin is present in the nail bed.  Nail is starting to come back but there is no pain.  Is no drainage or pus.  There is no swelling.  There is mild erythema to the distal hallux but this is unchanged.  There is no lacerations present.  There is no warmth there is no ascending cellulitis.  There is no fluctuation or crepitation.  There is no malodor.  Is no clinical signs of infection noted.  Vascular: Dorsalis Pedis artery and Posterior Tibial artery pedal pulses are 2/4 bilateral with immedate capillary fill time.There is no pain with calf compression, swelling, warmth, erythema.   Neruologic: Grossly intact via light touch bilateral.  Protective threshold with Semmes Wienstein monofilament intact to all pedal sites bilateral.   Musculoskeletal: No gross boney pedal deformities bilateral. No pain, crepitus, or limitation noted with foot and ankle range of motion bilateral. Muscular strength 5/5 in all groups tested bilateral.  Gait: Unassisted, Nonantalgic.     Assessment & Plan:  51 year old female right hallux onychomycosis due to traumatic injury -Treatment options discussed including all alternatives, risks, and complications -Etiology of symptoms were discussed -At this time the nail is growing back in.  Discussed with her may come back and thick or discolored we will also wait and see what happens of the nail.  There is some mild erythema to the toe but this is unchanged there is no warmth or swelling or ascending sialitis.  Noted this is infection  it is more from inflammation from healing in the nail coming off and starting to grow back again.  We will continue to monitor.  I will see her back next couple weeks if he continues injecting the area but I discussed with her to monitor any signs or symptoms of infection to call the office if any changes are to occur. -In general I think she can be good for her to come in every year for diabetic foot evaluation.  Vivi BarrackMatthew R Wagoner DPM

## 2017-12-08 ENCOUNTER — Ambulatory Visit: Payer: Self-pay

## 2017-12-08 ENCOUNTER — Encounter: Payer: Self-pay | Admitting: Dietician

## 2017-12-10 ENCOUNTER — Encounter: Payer: Self-pay | Admitting: Dietician

## 2017-12-10 ENCOUNTER — Ambulatory Visit (INDEPENDENT_AMBULATORY_CARE_PROVIDER_SITE_OTHER): Payer: Self-pay | Admitting: Internal Medicine

## 2017-12-10 ENCOUNTER — Ambulatory Visit: Payer: Self-pay | Admitting: Dietician

## 2017-12-10 ENCOUNTER — Encounter: Payer: Self-pay | Admitting: Internal Medicine

## 2017-12-10 VITALS — BP 126/73 | HR 88 | Temp 98.2°F | Resp 20 | Ht 64.5 in | Wt 256.2 lb

## 2017-12-10 DIAGNOSIS — I10 Essential (primary) hypertension: Secondary | ICD-10-CM

## 2017-12-10 DIAGNOSIS — E1142 Type 2 diabetes mellitus with diabetic polyneuropathy: Secondary | ICD-10-CM

## 2017-12-10 DIAGNOSIS — Z794 Long term (current) use of insulin: Secondary | ICD-10-CM

## 2017-12-10 DIAGNOSIS — E1165 Type 2 diabetes mellitus with hyperglycemia: Secondary | ICD-10-CM

## 2017-12-10 DIAGNOSIS — IMO0001 Reserved for inherently not codable concepts without codable children: Secondary | ICD-10-CM

## 2017-12-10 DIAGNOSIS — Z87891 Personal history of nicotine dependence: Secondary | ICD-10-CM

## 2017-12-10 MED ORDER — INSULIN ISOPHANE & REGULAR (HUMAN 70-30)100 UNIT/ML KWIKPEN: PEN_INJECTOR | SUBCUTANEOUS | 11 refills | Status: DC

## 2017-12-10 NOTE — Progress Notes (Signed)
   CC: Diabetes, Hypertension  HPI:  [name] is a 51 y.o. F with PMHx listed below presenting for Diabetes, Hypertension. Please see the A&P for the status of the patient's chronic medical problems.  Amy Norris wore the CGM for 9 days. The average reading was 270, % time in target was 2, % time below target was 0, and % time above target was. 98. Intervention will be to Increase AM 70/30 Insulin to 40U, and Increase PM 70/30 Insulin to 45U. The patient will be scheduled for a final appointment in 1 week.     Past Medical History:  Diagnosis Date  . Anal fissure   . Breast mass   . Bronchitis   . Carpal tunnel syndrome, bilateral   . Cholelithiasis   . Depression   . Diabetes mellitus    Type II  . Essential hypertension   . GERD (gastroesophageal reflux disease)   . Herpes simplex type 1 infection    vaginal  . Hypokalemia    2/2 diarrhea  . Menorrhagia   . Obesity   . Polycystic ovarian disease   . Renal calculi   . Renal calculus   . Sleep apnea, obstructive    Review of Systems:  Performed and negative except as otherwise indicated.   Physical Exam:  Vitals:   12/10/17 0919  BP: 126/73  Pulse: 88  Resp: 20  Temp: 98.2 F (36.8 C)  SpO2: 98%  Weight: 256 lb 3.2 oz (116.2 kg)  Height: 5' 4.5" (1.638 m)   Physical Exam  Constitutional: She appears well-developed and well-nourished.  Cardiovascular: Normal rate, regular rhythm and normal heart sounds.  Pulmonary/Chest: Effort normal and breath sounds normal. No respiratory distress.  Abdominal: Soft. Bowel sounds are normal. She exhibits no distension. There is no tenderness.     Assessment & Plan:   See Encounters Tab for problem based charting.  Patient seen with Dr. Oswaldo DoneVincent

## 2017-12-10 NOTE — Progress Notes (Signed)
Diabetes Self Management Education Visit:  SUBJECTIVE: 51 y.o. female for follow up of diabetes. CGM Sensor was downloaded and copies provided t patient and the doctor.   Other symptoms and concerns: itching around sensor site, but patient says it is tolerable for another week  Current Outpatient Medications  Medication Sig Dispense Refill  . benzonatate (TESSALON) 100 MG capsule TAKE ONE CAPSULE BY MOUTH THREE TIMES DAILY AS NEEDED FOR COUGH 30 capsule 1  . FLUoxetine (PROZAC) 40 MG capsule Take 2 capsules (80 mg total) by mouth daily. 60 capsule 5  . gabapentin (NEURONTIN) 300 MG capsule Take 1 capsule (300 mg total) by mouth 3 (three) times daily. 270 capsule 3  . glucose blood test strip Use to check blood sugar 3 to 4 times daily. Diag code E11.65. Insulin dependent 100 each 12  . Insulin Isophane & Regular Human (HUMULIN 70/30 KWIKPEN) (70-30) 100 UNIT/ML PEN Inject 40 Units into the skin every morning AND 45 Units every evening. 15 mL 11  . Insulin Pen Needle 32G X 6 MM MISC Please use to inject insulin twice daily. ICD 10: E11.65 100 each 11  . lisinopril (PRINIVIL,ZESTRIL) 10 MG tablet Take 1 tablet (10 mg total) by mouth daily. 90 tablet 1  . loratadine (CLARITIN) 10 MG tablet Take 1 tablet (10 mg total) by mouth daily. 30 tablet 1  . lovastatin (MEVACOR) 40 MG tablet Take 1 tablet (40 mg total) by mouth daily. 30 tablet 11  . metFORMIN (GLUMETZA) 1000 MG (MOD) 24 hr tablet Take 1 tablet (1,000 mg total) by mouth daily with breakfast. 90 tablet 0   No current facility-administered medications for this visit.     OBJECTIVE: Slight redness of skin around the CGM sensor. Patient says she has been scratching it.   ASSESSMENT: CGM shows no low blood sugars. Patient offered to discuss food and activity log with certified diabetes educator. Her blood sugar pattern is generally  More elevated in the evening and overnight and lower during the daytime hours. I spent 10 minutes downloading the  CGM and explaining the report to patient.   PLAN: Appointment in 1 week for CGM follow up- second week Janiel Derhammer, Lupita LeashDonna, RD 12/10/2017 2:50 PM.

## 2017-12-10 NOTE — Patient Instructions (Addendum)
Thank you for allowing us to care for you.  For your diabetes: - Your continues glucose montiore showed average blood sugar of 270 with highs overnight - Continue Metformin - Take Insulin 70/30 40 U in AM and 45 U in PM  For your high blood pressure - BP was improved today - Start Lisinopril 10mg  - You will have a lab (BMP) checked at your next appointment  Please follow up in 1 week for your second Continues Glucose Monitor follow up appointment.

## 2017-12-10 NOTE — Assessment & Plan Note (Addendum)
Patient BP elevated at last visit and patient was found to be unaware of her prescribed ARB Raynelle Chary(Edarbi). She was started on Lisinopril 10mg  Daily at that time as she gets her medications through the health department assistance program and this is one of the medications available to her. She states she has been unable to pick up the Lisinopril yet as she was told it was not ready yet. BP today was 126/73 despite not being able to pick up medication.  - Lisinopril 10mg , Daily - BMP at planned 2 week follow up visit for CGM

## 2017-12-10 NOTE — Progress Notes (Signed)
Internal Medicine Clinic Attending  I saw and evaluated the patient.  I personally confirmed the key portions of the history and exam documented by Dr. Melvin and I reviewed pertinent patient test results.  The assessment, diagnosis, and plan were formulated together and I agree with the documentation in the resident's note.  

## 2017-12-10 NOTE — Assessment & Plan Note (Addendum)
Patient presenting for CGM follow up. A1C was up at last visit to 8.8. She was started on CGM at that visit to help with insulin dose adjustments. Current regimen is Metformin 1000mg  daily and 35 units of 70/30 BID with meals.    Amy Norris wore the CGM for 9 days. The average reading was 270, % time in target was 2, % time below target was 0, and % time above target was. 98. Intervention will be to Increase AM 70/30 Insulin to 40U and Increase PM 70/30 Insulin to 45U. The patient will be scheduled for a final appointment in 1 week.  - CGM 1 week follow up today - CGM 2 week follow up next week - ContinueMetformin 1000mg  daily - Insulin 70/30, 40 Units in AM and 45 Units in PM with meals.

## 2017-12-11 ENCOUNTER — Other Ambulatory Visit: Payer: Self-pay | Admitting: *Deleted

## 2017-12-11 ENCOUNTER — Telehealth: Payer: Self-pay | Admitting: *Deleted

## 2017-12-11 MED ORDER — METFORMIN HCL ER (MOD) 1000 MG PO TB24: 1000.0000 mg | ORAL_TABLET | Freq: Every day | ORAL | 0 refills | Status: DC

## 2017-12-11 NOTE — Telephone Encounter (Signed)
[facility] - Metformin Mod ER 1000 mg costs over $5000.00. Wants to know if u could change to 500 mg ER tablets? And change on med list  Thanks

## 2017-12-12 MED ORDER — METFORMIN HCL ER 500 MG PO TB24: 1000.0000 mg | ORAL_TABLET | Freq: Every day | ORAL | 3 refills | Status: DC

## 2017-12-12 NOTE — Telephone Encounter (Signed)
Sent 500mg  tabs to [facility]

## 2017-12-15 ENCOUNTER — Ambulatory Visit (INDEPENDENT_AMBULATORY_CARE_PROVIDER_SITE_OTHER): Payer: Self-pay | Admitting: Internal Medicine

## 2017-12-15 ENCOUNTER — Encounter: Payer: Self-pay | Admitting: Internal Medicine

## 2017-12-15 ENCOUNTER — Ambulatory Visit (INDEPENDENT_AMBULATORY_CARE_PROVIDER_SITE_OTHER): Payer: Self-pay | Admitting: Dietician

## 2017-12-15 ENCOUNTER — Other Ambulatory Visit: Payer: Self-pay

## 2017-12-15 ENCOUNTER — Encounter: Payer: Self-pay | Admitting: Dietician

## 2017-12-15 VITALS — BP 150/74 | HR 70 | Temp 98.1°F | Wt 261.8 lb

## 2017-12-15 DIAGNOSIS — E1142 Type 2 diabetes mellitus with diabetic polyneuropathy: Secondary | ICD-10-CM

## 2017-12-15 DIAGNOSIS — J069 Acute upper respiratory infection, unspecified: Secondary | ICD-10-CM

## 2017-12-15 DIAGNOSIS — B9789 Other viral agents as the cause of diseases classified elsewhere: Secondary | ICD-10-CM

## 2017-12-15 DIAGNOSIS — Z87891 Personal history of nicotine dependence: Secondary | ICD-10-CM

## 2017-12-15 DIAGNOSIS — Z713 Dietary counseling and surveillance: Secondary | ICD-10-CM

## 2017-12-15 DIAGNOSIS — I10 Essential (primary) hypertension: Secondary | ICD-10-CM

## 2017-12-15 DIAGNOSIS — Z794 Long term (current) use of insulin: Secondary | ICD-10-CM

## 2017-12-15 DIAGNOSIS — R05 Cough: Secondary | ICD-10-CM

## 2017-12-15 DIAGNOSIS — E119 Type 2 diabetes mellitus without complications: Secondary | ICD-10-CM

## 2017-12-15 DIAGNOSIS — Z79899 Other long term (current) drug therapy: Secondary | ICD-10-CM

## 2017-12-15 MED ORDER — METFORMIN HCL ER 500 MG PO TB24: 2000.0000 mg | ORAL_TABLET | Freq: Every day | ORAL | 3 refills | Status: DC

## 2017-12-15 MED ORDER — BENZONATATE 100 MG PO CAPS: ORAL_CAPSULE | ORAL | 1 refills | Status: DC

## 2017-12-15 NOTE — Assessment & Plan Note (Addendum)
Patient presenting for CGM follow up.Current regimen is Metformin 1000mg  daily and  Insulin 70/30 40 in AM and 45 in PM with meals. Her CGM device has not been functioning for the past 7 days, so we will be unable to adjust medications today. She is going to check her blood sugar with he glucometer 3-4 times per day and bring the meter to her follow up visit for further adjustment.  Patient additionally reports taking her Metformin 1000mg  XR twice a day instead of once per day as instructed. She has been tolerating this so her Metformin will be adjusted to 2,000mg  daily. - Continue Metformin XR, 2,000 mg, Daily. - Continue Insulin 70/30, 40 Units in AM and 45 Units in PM - Home blood glucose monitoring, patient is to bring to follow up visit - Dietary modifications, including snacking to reduce the size of her evening meal

## 2017-12-15 NOTE — Progress Notes (Signed)
   CC: Diabetes. Hypertension, Cough  HPI:  Amy Norris is a 51 y.o. F with PMHx listed below presenting for  Diabetes. Hypertension, Cough. Please see the A&P for the status of the patient's chronic medical problems.  Khristi P Griffith wore the CGM for 7 days. The device has not registered readings for the past 7 days and we were unable to make adjustments with this data.   Patient additionally complains of days of runny nose, congestion, and cough (ocassionally productive, unknown sputum color). She denies fevers, chills, aches, nausea. She requests tessalon pearls as this has helped in the past.    Past Medical History:  Diagnosis Date  . Anal fissure   . Breast mass   . Bronchitis   . Carpal tunnel syndrome, bilateral   . Cholelithiasis   . Depression   . Diabetes mellitus    Type II  . Essential hypertension   . GERD (gastroesophageal reflux disease)   . Herpes simplex type 1 infection    vaginal  . Hypokalemia    2/2 diarrhea  . Menorrhagia   . Obesity   . Polycystic ovarian disease   . Renal calculi   . Renal calculus   . Sleep apnea, obstructive    Review of Systems:  Performed and negative except as otherwise indicated.  Physical Exam:  Vitals:   12/15/17 0851  BP: (!) 150/74  Pulse: 70  Temp: 98.1 F (36.7 C)  TempSrc: Oral  SpO2: 98%  Weight: 261 lb 12.8 oz (118.8 kg)   Physical Exam  HENT:  Mouth/Throat: Oropharynx is clear and moist. No oropharyngeal exudate.  Cardiovascular: Normal rate, regular rhythm, normal heart sounds and intact distal pulses.  Pulmonary/Chest: Effort normal and breath sounds normal. No respiratory distress.  Abdominal: Soft. Bowel sounds are normal. She exhibits no distension.    Assessment & Plan:   See Encounters Tab for problem based charting.  Patient seen with Dr. Josem KaufmannKlima

## 2017-12-15 NOTE — Progress Notes (Signed)
Diabetes Self Management Education Visit 2:  Start time:1015 am End time: 1035 am SUBJECTIVE: 51 y.o. female for follow up of diabetes. CGM Sensor download did not show any additional information. It stopped working December 10th. Discussed cost of replacing CGM professional sensor with patient today. She does not want to have the professional CGM sensor replaced and asks about cost of the personal CGM. She is thinking about purchasing it after the new year.  OBJECTIVE:  Wt Readings from Last 3 Encounters:  12/15/17 261 lb 12.8 oz (118.8 kg)  12/10/17 256 lb 3.2 oz (116.2 kg)  12/01/17 258 lb 8 oz (117.3 kg)     Lab Results  Component Value Date   HGBA1C 8.8 12/01/2017     Current Outpatient Medications  Medication Sig Dispense Refill  . benzonatate (TESSALON) 100 MG capsule TAKE ONE CAPSULE BY MOUTH THREE TIMES DAILY AS NEEDED FOR COUGH 30 capsule 1  . FLUoxetine (PROZAC) 40 MG capsule Take 2 capsules (80 mg total) by mouth daily. 60 capsule 5  . gabapentin (NEURONTIN) 300 MG capsule Take 1 capsule (300 mg total) by mouth 3 (three) times daily. 270 capsule 3  . glucose blood test strip Use to check blood sugar 3 to 4 times daily. Diag code E11.65. Insulin dependent 100 each 12  . Insulin Isophane & Regular Human (HUMULIN 70/30 KWIKPEN) (70-30) 100 UNIT/ML PEN Inject 40 Units into the skin every morning AND 45 Units every evening. 15 mL 11  . Insulin Pen Needle 32G X 6 MM MISC Please use to inject insulin twice daily. ICD 10: E11.65 100 each 11  . lisinopril (PRINIVIL,ZESTRIL) 10 MG tablet Take 1 tablet (10 mg total) by mouth daily. 90 tablet 1  . loratadine (CLARITIN) 10 MG tablet Take 1 tablet (10 mg total) by mouth daily. 30 tablet 1  . lovastatin (MEVACOR) 40 MG tablet Take 1 tablet (40 mg total) by mouth daily. 30 tablet 11  . metFORMIN (GLUCOPHAGE-XR) 500 MG 24 hr tablet Take 4 tablets (2,000 mg total) by mouth daily with breakfast. 120 tablet 3   No current  facility-administered medications for this visit.     ASSESSMENT:   Her biggest meal is after work 830 Pm or later, goes to bed at 11 PM.  Reports poor sleep, often awakens in middle of night for unknown reasons.  Skips meals, thinks she eats large portions at night because she has skipped meals during day.  24 hours recall:  Took 40-45 units insulin in am today. Breakfast- dranks coke zero,( usualy skips eating or has a honey bun) Noon often is first meal of day at work- crackers, soda Dinner 830 PM or later on work days- steak & cheese hot pocket, pineapple orange regular soda and a pack of instant mashed potatoes with bacon bits Blood sugars: reports 236 today fasting which she was pleased was lower, her target for before meals is 130-180 mg/dl. She reports that in the past she has had symptoms of low blood sugar when insulin was increased. Medicine: taking 500  Mg metformin ER two twice a day. Insulin 40-45 units twice a day   PLAN: Ms. [name] plans to try to get healthy snacks at work to see if this helps her to eat less  Late at night. Follow up as desired by patient for additional diabetes self management training.  Roosvelt Churchwell, Lupita LeashDonna, RD 12/16/2017 11:41 AM.

## 2017-12-15 NOTE — Patient Instructions (Addendum)
Thank you for allowing us to care for you  For yout Diabetes: - Continue Metformin XR, take 2,000 mg in the morning - Continue Insulin 70/30, 40 Units in AM and 45 Units in PM - Check your blood sugar in the morning and before each meal, and bring your readings to your follow up visit - Continue dietary modifications  For your Hypertension - Please pick up your Lisinopril today - We will obtain labs at your follow up in 1-2 weeks  For your Cough and Congestion - We have oredered tessalon pearls for you - Recommend sinus rinse  Please return for follow up    Sinus Rinse What is a sinus rinse? A sinus rinse is a home treatment. It rinses your sinuses with a mixture of salt and water (saline solution). Sinuses are air-filled spaces in your skull behind the bones of your face and forehead. They open into your nasal cavity. To do a sinus rinse, you will need:  Saline solution.  Neti pot or spray bottle. This releases the saline solution into your nose and through your sinuses. You can buy neti pots and spray bottles at: ? [facility] local [facility]. ? A health food store. ? Online.  When should I do a sinus rinse? A sinus rinse can help to clear your nasal cavity. It can clear:  Mucus.  Dirt.  Dust.  Pollen.  You may do a sinus rinse when you have:  A cold.  A virus.  Allergies.  A sinus infection.  A stuffy nose.  If you are considering a sinus rinse:  Ask your child's doctor before doing a sinus rinse on your child.  Do not do a sinus rinse if you have had: ? Ear or nasal surgery. ? An ear infection. ? Blocked ears.  How do I do a sinus rinse?  Wash your hands.  Disinfect your device using the directions that came with the device.  Dry your device.  Use the solution that comes with your device or one that is sold separately in stores. Follow the mixing directions on the package.  Fill your device with the amount of saline solution as stated in the device  instructions.  Stand over a sink and tilt your head sideways over the sink.  Place the spout of the device in your upper nostril (the one closer to the ceiling).  Gently pour or squeeze the saline solution into the nasal cavity. The liquid should drain to the lower nostril if you are not too congested.  Gently blow your nose. Blowing too hard may cause ear pain.  Repeat in the other nostril.  Clean and rinse your device with clean water.  Air-dry your device. Are there risks of a sinus rinse? Sinus rinse is normally very safe and helpful. However, there are a few risks, which include:  A burning feeling in the sinuses. This may happen if you do not make the saline solution as instructed. Make sure to follow all directions when making the saline solution.  Infection from unclean water. This is rare, but possible.  Nasal irritation.  This information is not intended to replace advice given to you by your health care provider. Make sure you discuss any questions you have with your health care provider. Document Released: 07/13/2014 Document Revised: 11/12/2016 Document Reviewed: 05/03/2014 Elsevier Interactive Patient Education  2017 [employer]

## 2017-12-15 NOTE — Assessment & Plan Note (Signed)
Patient additionally complains of days of runny nose, congestion, and cough (ocassionally productive, unknown sputum color). She denies fevers, chills, aches, nausea. She requests tessalon pearls as this has helped in the past.   Consistent with Viral URI. Will provide tessalon pearls - Supportive care - Tessalon Pearls - Recommend nasal rinse

## 2017-12-15 NOTE — Assessment & Plan Note (Addendum)
Patient recently started on Lisinopril 10mg  Daily as she gets her medications through the health department assistance program and this is one of the medications available to her.   She has yet to pick up the Lisinopril, but states she will go by today to pick it up. BP today Elevated at 150/74. - Lisinopril 10mg , Daily - BMP at follow up visit in 1-2 weeks

## 2017-12-16 ENCOUNTER — Encounter: Payer: Self-pay | Admitting: Dietician

## 2017-12-16 NOTE — Patient Instructions (Signed)
What we talked about today:   How much a personal FREESTYLE LIBRE Flash continuous glucose monitor costs.   That maybe you eat more at your dinner late at night because you haven't eaten much in the afternoon at work. This could be causing increased blood sugars overnight and weight gain.   The plan:   See if getting snacks at work in the afternoon help you to be less hungry late at night.   Healthy snacks- fruit, yogurt, protein drink, vegetables, sandwich- could pack a peanut butter sandwich to take with you to work.  Suggest you make an appointment to follow up with me in January.  Call anytime with questions or concerns  Norm ParcelDonna Diandra Cimini Diabetes Educator [phone]

## 2017-12-19 ENCOUNTER — Telehealth: Payer: Self-pay | Admitting: *Deleted

## 2017-12-19 NOTE — Telephone Encounter (Signed)
Amy ElySteven Norris - Workers' Comp states pt needs clinicals from 12/05/2017 faxed to [phone].

## 2017-12-19 NOTE — Progress Notes (Signed)
Patient ID: Dorian HeckleHenrietta P Norris, female   DOB: 1966-09-22, 51 y.o.   MRN: [id number]  I saw and evaluated the patient.  I personally confirmed the key portions of Dr. Vesta MixerMelvin's history and exam and reviewed pertinent patient test results.  The assessment, diagnosis, and plan were formulated together and I agree with the documentation in the resident's note.

## 2017-12-19 NOTE — Telephone Encounter (Signed)
Will get this taken care of. Thank you! 

## 2017-12-19 NOTE — Addendum Note (Signed)
Addended by: Doneen PoissonKLIMA, Nyella Eckels D on: 12/19/2017 06:15 PM   Modules accepted: Level of Service

## 2018-03-09 ENCOUNTER — Encounter: Payer: Self-pay | Admitting: Internal Medicine

## 2018-03-09 ENCOUNTER — Other Ambulatory Visit: Payer: Self-pay

## 2018-03-09 ENCOUNTER — Ambulatory Visit (INDEPENDENT_AMBULATORY_CARE_PROVIDER_SITE_OTHER): Payer: Self-pay | Admitting: Internal Medicine

## 2018-03-09 VITALS — BP 139/62 | HR 76 | Temp 98.4°F | Ht 64.5 in | Wt 260.5 lb

## 2018-03-09 DIAGNOSIS — Z794 Long term (current) use of insulin: Secondary | ICD-10-CM

## 2018-03-09 DIAGNOSIS — E7800 Pure hypercholesterolemia, unspecified: Secondary | ICD-10-CM

## 2018-03-09 DIAGNOSIS — Z0000 Encounter for general adult medical examination without abnormal findings: Secondary | ICD-10-CM

## 2018-03-09 DIAGNOSIS — Z79899 Other long term (current) drug therapy: Secondary | ICD-10-CM

## 2018-03-09 DIAGNOSIS — Z9114 Patient's other noncompliance with medication regimen: Secondary | ICD-10-CM

## 2018-03-09 DIAGNOSIS — E1142 Type 2 diabetes mellitus with diabetic polyneuropathy: Secondary | ICD-10-CM

## 2018-03-09 DIAGNOSIS — Z87891 Personal history of nicotine dependence: Secondary | ICD-10-CM

## 2018-03-09 DIAGNOSIS — Z833 Family history of diabetes mellitus: Secondary | ICD-10-CM

## 2018-03-09 DIAGNOSIS — I10 Essential (primary) hypertension: Secondary | ICD-10-CM

## 2018-03-09 LAB — GLUCOSE, CAPILLARY: Glucose-Capillary: 150 mg/dL — ABNORMAL HIGH (ref 65–99)

## 2018-03-09 LAB — POCT GLYCOSYLATED HEMOGLOBIN (HGB A1C): Hemoglobin A1C: 10.1

## 2018-03-09 MED ORDER — ATORVASTATIN CALCIUM 40 MG PO TABS: 40.0000 mg | ORAL_TABLET | Freq: Every day | ORAL | 1 refills | Status: DC

## 2018-03-09 NOTE — Progress Notes (Signed)
CC: T2DM, HLD, Health Maintenance  HPI:  [name] is a 52 y.o. female with PMH below here primarily to address her T2DM, she began the interview by saying she knows I will be disappointed in her and that her A1C will almost certainly be higher.    Please see A&P for status of the patient's chronic medical conditions  Past Medical History:  Diagnosis Date  . Anal fissure   . Breast mass   . Bronchitis   . Carpal tunnel syndrome, bilateral   . Cholelithiasis   . Depression   . Diabetes mellitus    Type II  . Essential hypertension   . GERD (gastroesophageal reflux disease)   . Herpes simplex type 1 infection    vaginal  . Hypokalemia    2/2 diarrhea  . Menorrhagia   . Obesity   . Polycystic ovarian disease   . Renal calculi   . Renal calculus   . Sleep apnea, obstructive    Review of Systems:  ROS: Pulmonary: pt denies increased work of breathing, shortness of breath,  Cardiac: pt denies palpitations, chest pain,  Abdominal: pt denies abdominal pain, nausea, vomiting, or diarrhea  Physical Exam:  Vitals:   03/09/18 1609  BP: 139/62  Pulse: 76  Temp: 98.4 F (36.9 C)  TempSrc: Oral  SpO2: 97%  Weight: 260 lb 8 oz (118.2 kg)  Height: 5' 4.5" (1.638 m)   Physical Exam  Constitutional: No distress.  HENT:  Head: Normocephalic and atraumatic.  Cardiovascular: Normal rate, regular rhythm and normal heart sounds. Exam reveals no gallop and no friction rub.  No murmur heard. Pulmonary/Chest: Effort normal and breath sounds normal. No respiratory distress. She has no wheezes. She has no rales. She exhibits no tenderness.  Abdominal: Soft. Bowel sounds are normal. She exhibits no distension and no mass. There is no tenderness. There is no rebound and no guarding.  Neurological: She is alert.  Skin: She is not diaphoretic.    Social History   Socioeconomic History  . Marital status: Married    Spouse name: Not on file  . Number of children: Not on  file  . Years of education: Not on file  . Highest education level: Not on file  Social Needs  . Financial resource strain: Not on file  . Food insecurity - worry: Not on file  . Food insecurity - inability: Not on file  . Transportation needs - medical: Not on file  . Transportation needs - non-medical: Not on file  Occupational History  . Occupation: Deli    Employer: [employer]  Tobacco Use  . Smoking status: Former Smoker    Types: Cigarettes    Last attempt to quit: 12/30/1978    Years since quitting: 39.2  . Smokeless tobacco: Never Used  Substance and Sexual Activity  . Alcohol use: No    Alcohol/week: 0.0 oz  . Drug use: No  . Sexual activity: No  Other Topics Concern  . Not on file  Social History Narrative    Smoked many years ago drinks 1-2 beers on Friday and Saturdays no illegal drug use. Married and lives with her husband and sister-in-law son daughter and grandson.   Financial assistance approved for 100% discount at [facility] and has [facility] card per Rudell Cobb as of August 06, 2010 6:27 PM    Family History  Problem Relation Age of Onset  . Hypertension Mother   . Diabetes Father     Assessment &  Plan:   See Encounters Tab for problem based charting.  Patient discussed with Dr. Rogelia BogaButcher

## 2018-03-09 NOTE — Patient Instructions (Signed)
Good to see you Amy Norris.  Our biggest goal will be working on your diabetes, your A1C was elevated today to 10.1.  Therefore as we discussed please take your metformin and insulin as prescribed and get a new meter to go with your test strips.  Bring a log of those blood sugars we discussed first thing in the morning and then 2 hours after a meal.  Once you get two weeks of those blood sugar readings, please bring the log back into the clinic for further evaluation.  If you did not take your medicine and or your blood sugar readings please reschedule your appointment.  We have started you on a new cholesterol medication lipitor 40mg  daily, please stop taking the lovastatin and begin taking lipitor.

## 2018-03-10 NOTE — Assessment & Plan Note (Addendum)
Lab Results  Component Value Date   HGBA1C 10.1 03/09/2018   Patient's A1C has been steadily increasing.  In talking with the patient, she has not been taking her metformin or Insulin 70/30 consistently.  She states she barely takes her metformin and only occasionally takes her 70/30 insulin.  We are limited by her lack of insurance.  She has plenty of metformin and insulin at home, but she reports she forgets to take it and has not made it a priority.  I thought after our last visit we were making progress, she was interested in getting a CGM and came to her follow up appointments to titrate her insulin after it was placed.  I'm not sure what happened between then and now to make her lose her motivation.  She currently says her meter is no longer working but she has plenty of test strips.  -emphasized importance of getting a new meter, gave specific instructions on when to take blood sugars and keep a log while she is compliant with her insulin and metformin and we can continue to titrate her insulin therapy.   -continue metformin 2000mg  daily, insulin 70/30, 40 U am and 45 U pm

## 2018-03-10 NOTE — Assessment & Plan Note (Signed)
We are limited by patient's lack of insurance.  We have decided to go with FOBT immunohistochemical test in lieu of colonoscopy.  Our clinic run machine that screens for diabetic retinopathy is currently inoperable, therefore we will defer this test for a few months.

## 2018-03-10 NOTE — Assessment & Plan Note (Signed)
BP Readings from Last 3 Encounters:  03/09/18 139/62  12/15/17 (!) 150/74  12/10/17 126/73   Blood pressure is within goal today, patient has only mild hypertension and was started mainly for renal protection.    -continue lisinopril 10mg  daily

## 2018-03-10 NOTE — Assessment & Plan Note (Signed)
Lab Results  Component Value Date   CHOL 178 02/20/2016   HDL 60 02/20/2016   LDLCALC 92 02/20/2016   TRIG 129 02/20/2016   CHOLHDL 3.0 02/20/2016   Patient still not within goal on last lipid panel.  Has considerable risk given poorly controlled diabetes  -will switch patient to atorvastatin 40mg  daily from lovastatin 40mg  daily

## 2018-03-13 ENCOUNTER — Telehealth: Payer: Self-pay | Admitting: Internal Medicine

## 2018-03-13 NOTE — Telephone Encounter (Signed)
pt doing well, now back taking metformin and insulin.  Has not yet purchased meter but assures me she will this weekend.

## 2018-03-13 NOTE — Progress Notes (Signed)
Internal Medicine Clinic Attending  Case discussed with Dr. Winfrey  at the time of the visit.  We reviewed the resident's history and exam and pertinent patient test results.  I agree with the assessment, diagnosis, and plan of care documented in the resident's note.  

## 2018-05-12 ENCOUNTER — Encounter: Payer: Self-pay | Admitting: Internal Medicine

## 2018-05-12 ENCOUNTER — Encounter (INDEPENDENT_AMBULATORY_CARE_PROVIDER_SITE_OTHER): Payer: Self-pay

## 2018-05-12 ENCOUNTER — Ambulatory Visit: Payer: Self-pay | Admitting: Dietician

## 2018-05-12 ENCOUNTER — Ambulatory Visit (INDEPENDENT_AMBULATORY_CARE_PROVIDER_SITE_OTHER): Payer: Self-pay | Admitting: Internal Medicine

## 2018-05-12 VITALS — BP 140/56 | HR 69 | Temp 98.1°F | Wt 253.3 lb

## 2018-05-12 DIAGNOSIS — Z8249 Family history of ischemic heart disease and other diseases of the circulatory system: Secondary | ICD-10-CM

## 2018-05-12 DIAGNOSIS — Z79899 Other long term (current) drug therapy: Secondary | ICD-10-CM

## 2018-05-12 DIAGNOSIS — E1142 Type 2 diabetes mellitus with diabetic polyneuropathy: Secondary | ICD-10-CM

## 2018-05-12 DIAGNOSIS — IMO0001 Reserved for inherently not codable concepts without codable children: Secondary | ICD-10-CM

## 2018-05-12 DIAGNOSIS — Z833 Family history of diabetes mellitus: Secondary | ICD-10-CM

## 2018-05-12 DIAGNOSIS — E1165 Type 2 diabetes mellitus with hyperglycemia: Secondary | ICD-10-CM

## 2018-05-12 DIAGNOSIS — M25562 Pain in left knee: Secondary | ICD-10-CM

## 2018-05-12 DIAGNOSIS — Z794 Long term (current) use of insulin: Secondary | ICD-10-CM

## 2018-05-12 DIAGNOSIS — I10 Essential (primary) hypertension: Secondary | ICD-10-CM

## 2018-05-12 DIAGNOSIS — Z87891 Personal history of nicotine dependence: Secondary | ICD-10-CM

## 2018-05-12 DIAGNOSIS — M19012 Primary osteoarthritis, left shoulder: Secondary | ICD-10-CM

## 2018-05-12 DIAGNOSIS — M19011 Primary osteoarthritis, right shoulder: Secondary | ICD-10-CM

## 2018-05-12 DIAGNOSIS — M1990 Unspecified osteoarthritis, unspecified site: Secondary | ICD-10-CM | POA: Insufficient documentation

## 2018-05-12 MED ORDER — EXENATIDE ER 2 MG ~~LOC~~ PEN: 2.0000 mg | PEN_INJECTOR | SUBCUTANEOUS | 3 refills | Status: DC

## 2018-05-12 MED ORDER — EXENATIDE 5 MCG/0.02ML ~~LOC~~ SOPN: 5.0000 ug | PEN_INJECTOR | Freq: Two times a day (BID) | SUBCUTANEOUS | 0 refills | Status: DC

## 2018-05-12 MED ORDER — ACETAMINOPHEN 500 MG PO CAPS: 500.0000 mg | ORAL_CAPSULE | ORAL | 3 refills | Status: AC | PRN

## 2018-05-12 MED ORDER — METFORMIN HCL ER 500 MG PO TB24: 2000.0000 mg | ORAL_TABLET | Freq: Every day | ORAL | 9 refills | Status: DC

## 2018-05-12 MED ORDER — INSULIN PEN NEEDLE 32G X 6 MM MISC: 11 refills | Status: DC

## 2018-05-12 MED FILL — BYETTA 5 MCG DOSE PEN INJ: 5 | 28 days supply | Qty: 1 | Fill #0

## 2018-05-12 MED FILL — UNIFINE PENTIPS 6MM 31G: 31G X 6 MM | 30 days supply | Qty: 100 | Fill #0

## 2018-05-12 NOTE — Progress Notes (Signed)
Educated patients about Bydureon. She verbalized and demonstrated understanding. Amy Norris called back from the pharmacy and said she got Byetta. Discussed with pharmacy and the Bydureon is no longer on the IM program list of available medicines, but Byetta is. The pharmacy agreed to instruct her on it's use and timing tinming with meals.   Attempted to take retinal images today, but were unable to obtain adequate images. Patient is complaining of trouble with her vision. Suggest referral to eye doctor

## 2018-05-12 NOTE — Assessment & Plan Note (Signed)
Lab Results  Component Value Date   HGBA1C 10.1 03/09/2018   A1C elevated last visit, not time to recheck at this visit.  Pt did not bring any logs of sugars.  Keeps juice by her bed and drinks it during the night.  Subjectively reports that she is taking all meds as prescribed metformin  daily, 70/30 insulin 40units am and 45 units pm.  We discussed dietary changes that she could do to help out.    -attempted to add bydureon through IM program unfortunately do not carry this at this time at [facility] but was given byetta instead.   -asked that pt please return with a blood sugar log and instructed when she should take her sugar measurements, pt has plenty of strips and a functional meter

## 2018-05-12 NOTE — Patient Instructions (Signed)
Good to see you.  Please continue working on your diabetes.  We will need a log of your blood sugars one first thing in the morning before food, one 2 hours after lunch and one before bedtime.  We will use this to help adjust your diabetes medicines.

## 2018-05-12 NOTE — Assessment & Plan Note (Addendum)
Pt reports repetitive motion with lifting crates at work.  May have some underlying arthritis based on exam but also seems to be a muscular component.  Discussed how she is trying to transfer positions at her job to reduce wear and tear and repetitive motion.  It does not bother her too significantly at this time.    -will try supportive care at this time, heating pad extra strength tylenol -if persistently bothersome could consider knee injection, sports med referral for shoulder injection

## 2018-05-12 NOTE — Progress Notes (Signed)
Formulary change from Bydureon to Byetta for IM program

## 2018-05-12 NOTE — Progress Notes (Signed)
CC: Shoulder pain, T2DM follow up, HTN  HPI:  [name] is a 52 y.o. female with past mental history below.  She reports overall doing well.  She did not bring her blood glucose meter or a log of her blood sugars reports of being in the low 200s at this time.  Reports good compliance with her medications, no side effects.  She also has a complaint of bilateral shoulder pain and some left knee pain  Please see A&P for status of the patient's chronic medical conditions  Past Medical History:  Diagnosis Date  . Anal fissure   . Breast mass   . Bronchitis   . Carpal tunnel syndrome, bilateral   . Cholelithiasis   . Depression   . Diabetes mellitus    Type II  . Essential hypertension   . GERD (gastroesophageal reflux disease)   . Herpes simplex type 1 infection    vaginal  . Hypokalemia    2/2 diarrhea  . Menorrhagia   . Obesity   . Polycystic ovarian disease   . Renal calculi   . Renal calculus   . Sleep apnea, obstructive    Review of Systems:  ROS: Pulmonary: pt denies increased work of breathing, shortness of breath,  Cardiac: pt denies palpitations, chest pain,  Abdominal: pt denies abdominal pain, nausea, vomiting, or diarrhea  Physical Exam:  Vitals:   05/12/18 0956  BP: (!) 140/56  Pulse: 69  Temp: 98.1 F (36.7 C)  TempSrc: Oral  SpO2: 98%  Weight: 253 lb 4.8 oz (114.9 kg)   Physical Exam  Constitutional: No distress.  Cardiovascular: Normal rate, regular rhythm and normal heart sounds. Exam reveals no gallop and no friction rub.  No murmur heard. Pulmonary/Chest: Effort normal and breath sounds normal. No respiratory distress. She has no wheezes. She has no rales. She exhibits no tenderness.  Abdominal: Soft. Bowel sounds are normal. She exhibits no distension and no mass. There is no tenderness. There is no rebound and no guarding.  Musculoskeletal: She exhibits tenderness (bilateral deltoid muscles).       Right shoulder: She exhibits  tenderness. She exhibits no crepitus.       Left knee: She exhibits bony tenderness (crepitus appreciated). She exhibits normal range of motion. Tenderness (with flexion and extension) found.  Neurological: She is alert.  Skin: She is not diaphoretic.    Social History   Socioeconomic History  . Marital status: Married    Spouse name: Not on file  . Number of children: Not on file  . Years of education: Not on file  . Highest education level: Not on file  Occupational History  . Occupation: Brewing technologist: [employer]  Social Needs  . Financial resource strain: Not on file  . Food insecurity:    Worry: Not on file    Inability: Not on file  . Transportation needs:    Medical: Not on file    Non-medical: Not on file  Tobacco Use  . Smoking status: Former Smoker    Types: Cigarettes    Last attempt to quit: 12/30/1978    Years since quitting: 39.3  . Smokeless tobacco: Never Used  Substance and Sexual Activity  . Alcohol use: No    Alcohol/week: 0.0 oz  . Drug use: No  . Sexual activity: Never  Lifestyle  . Physical activity:    Days per week: Not on file    Minutes per session: Not on file  .  Stress: Not on file  Relationships  . Social connections:    Talks on phone: Not on file    Gets together: Not on file    Attends religious service: Not on file    Active member of club or organization: Not on file    Attends meetings of clubs or organizations: Not on file    Relationship status: Not on file  . Intimate partner violence:    Fear of current or ex partner: Not on file    Emotionally abused: Not on file    Physically abused: Not on file    Forced sexual activity: Not on file  Other Topics Concern  . Not on file  Social History Narrative    Smoked many years ago drinks 1-2 beers on Friday and Saturdays no illegal drug use. Married and lives with her husband and sister-in-law son daughter and grandson.   Financial assistance approved for 100% discount at  [facility] and has [facility] card per Rudell Cobb as of August 06, 2010 6:27 PM    Family History  Problem Relation Age of Onset  . Hypertension Mother   . Diabetes Father     Assessment & Plan:   See Encounters Tab for problem based charting.  Patient discussed with Dr. Cleda Daub

## 2018-05-12 NOTE — Patient Instructions (Signed)
Make sure you carry your meter with you for the next few weeks  Take the Bydureon on the same day each week.

## 2018-05-12 NOTE — Assessment & Plan Note (Signed)
BP Readings from Last 3 Encounters:  05/12/18 (!) 140/56  03/09/18 139/62  12/15/17 (!) 150/74   Pt did not take her bp medicine this am.  Was in a hurry to get here which is why she states she did not take her insulin or bring her meter as well.    -close to goal on no medicine, continue lisinopril  for renal protection

## 2018-05-13 LAB — BMP8+ANION GAP
Anion Gap: 15 mmol/L (ref 10.0–18.0)
BUN / CREAT RATIO: 11 (ref 9–23)
BUN: 8 mg/dL (ref 6–24)
CALCIUM: 9.7 mg/dL (ref 8.7–10.2)
CO2: 27 mmol/L (ref 20–29)
Chloride: 101 mmol/L (ref 96–106)
Creatinine, Ser: 0.74 mg/dL (ref 0.57–1.00)
GFR calc non Af Amer: 94 mL/min/{1.73_m2} (ref >59)
GFR, EST AFRICAN AMERICAN: 108 mL/min/{1.73_m2} (ref >59)
GLUCOSE: 267 mg/dL — AB (ref 65–99)
POTASSIUM: 4.5 mmol/L (ref 3.5–5.2)
SODIUM: 143 mmol/L (ref 134–144)

## 2018-05-14 NOTE — Progress Notes (Signed)
Internal Medicine Clinic Attending  Case discussed with Dr. Winfrey  at the time of the visit.  We reviewed the resident's history and exam and pertinent patient test results.  I agree with the assessment, diagnosis, and plan of care documented in the resident's note.  

## 2018-05-15 ENCOUNTER — Telehealth: Payer: Self-pay

## 2018-06-09 ENCOUNTER — Ambulatory Visit: Payer: Self-pay | Admitting: Dietician

## 2018-06-30 ENCOUNTER — Other Ambulatory Visit: Payer: Self-pay | Admitting: Internal Medicine

## 2018-06-30 DIAGNOSIS — Z1231 Encounter for screening mammogram for malignant neoplasm of breast: Secondary | ICD-10-CM

## 2018-12-11 ENCOUNTER — Other Ambulatory Visit: Payer: Self-pay | Admitting: Internal Medicine

## 2018-12-11 DIAGNOSIS — J069 Acute upper respiratory infection, unspecified: Secondary | ICD-10-CM

## 2018-12-11 DIAGNOSIS — B9789 Other viral agents as the cause of diseases classified elsewhere: Principal | ICD-10-CM

## 2018-12-24 ENCOUNTER — Other Ambulatory Visit: Payer: Self-pay | Admitting: Internal Medicine

## 2018-12-24 DIAGNOSIS — J069 Acute upper respiratory infection, unspecified: Secondary | ICD-10-CM

## 2018-12-24 DIAGNOSIS — B9789 Other viral agents as the cause of diseases classified elsewhere: Principal | ICD-10-CM

## 2018-12-25 NOTE — Telephone Encounter (Signed)
refilled 

## 2019-02-05 ENCOUNTER — Other Ambulatory Visit: Payer: Self-pay

## 2019-02-05 ENCOUNTER — Emergency Department (HOSPITAL_COMMUNITY)
Admission: EM | Admit: 2019-02-05 | Discharge: 2019-02-06 | Disposition: A | Discharge status: Home / Self Care | Payer: Self-pay | Attending: Emergency Medicine | Admitting: Emergency Medicine

## 2019-02-05 ENCOUNTER — Emergency Department (HOSPITAL_COMMUNITY): Payer: Self-pay

## 2019-02-05 ENCOUNTER — Encounter (HOSPITAL_COMMUNITY): Payer: Self-pay | Admitting: Emergency Medicine

## 2019-02-05 DIAGNOSIS — Z87891 Personal history of nicotine dependence: Secondary | ICD-10-CM | POA: Insufficient documentation

## 2019-02-05 DIAGNOSIS — Y998 Other external cause status: Secondary | ICD-10-CM | POA: Insufficient documentation

## 2019-02-05 DIAGNOSIS — Z794 Long term (current) use of insulin: Secondary | ICD-10-CM | POA: Insufficient documentation

## 2019-02-05 DIAGNOSIS — I10 Essential (primary) hypertension: Secondary | ICD-10-CM | POA: Insufficient documentation

## 2019-02-05 DIAGNOSIS — R918 Other nonspecific abnormal finding of lung field: Secondary | ICD-10-CM

## 2019-02-05 DIAGNOSIS — W19XXXA Unspecified fall, initial encounter: Secondary | ICD-10-CM

## 2019-02-05 DIAGNOSIS — Y929 Unspecified place or not applicable: Secondary | ICD-10-CM | POA: Insufficient documentation

## 2019-02-05 DIAGNOSIS — S20211A Contusion of right front wall of thorax, initial encounter: Secondary | ICD-10-CM | POA: Insufficient documentation

## 2019-02-05 DIAGNOSIS — W01198A Fall on same level from slipping, tripping and stumbling with subsequent striking against other object, initial encounter: Secondary | ICD-10-CM | POA: Insufficient documentation

## 2019-02-05 DIAGNOSIS — Z79899 Other long term (current) drug therapy: Secondary | ICD-10-CM | POA: Insufficient documentation

## 2019-02-05 DIAGNOSIS — Y939 Activity, unspecified: Secondary | ICD-10-CM | POA: Insufficient documentation

## 2019-02-05 DIAGNOSIS — E1165 Type 2 diabetes mellitus with hyperglycemia: Secondary | ICD-10-CM | POA: Insufficient documentation

## 2019-02-05 DIAGNOSIS — R1011 Right upper quadrant pain: Secondary | ICD-10-CM | POA: Insufficient documentation

## 2019-02-05 DIAGNOSIS — R739 Hyperglycemia, unspecified: Secondary | ICD-10-CM

## 2019-02-05 LAB — CBC
HCT: 42.7 % (ref 36.0–46.0)
Hemoglobin: 14 g/dL (ref 12.0–15.0)
MCH: 28.9 pg (ref 26.0–34.0)
MCHC: 32.8 g/dL (ref 30.0–36.0)
MCV: 88.2 fL (ref 80.0–100.0)
Platelets: 293 10*3/uL (ref 150–400)
RBC: 4.84 MIL/uL (ref 3.87–5.11)
RDW: 12.3 % (ref 11.5–15.5)
WBC: 8.2 10*3/uL (ref 4.0–10.5)
nRBC: 0 % (ref 0.0–0.2)

## 2019-02-05 LAB — URINALYSIS, ROUTINE W REFLEX MICROSCOPIC
BILIRUBIN URINE: NEGATIVE
Glucose, UA: >=500 mg/dL — AB
Hgb urine dipstick: NEGATIVE
Ketones, ur: NEGATIVE mg/dL
NITRITE: NEGATIVE
Protein, ur: NEGATIVE mg/dL
SPECIFIC GRAVITY, URINE: 1.032 — AB (ref 1.005–1.030)
pH: 8 (ref 5.0–8.0)

## 2019-02-05 LAB — BASIC METABOLIC PANEL
Anion gap: 13 (ref 5–15)
BUN: 6 mg/dL (ref 6–20)
CO2: 22 mmol/L (ref 22–32)
Calcium: 9.6 mg/dL (ref 8.9–10.3)
Chloride: 102 mmol/L (ref 98–111)
Creatinine, Ser: 0.74 mg/dL (ref 0.44–1.00)
GFR calc Af Amer: >60 mL/min (ref >60)
GFR calc non Af Amer: >60 mL/min (ref >60)
Glucose, Bld: 433 mg/dL — ABNORMAL HIGH (ref 70–99)
POTASSIUM: 3.3 mmol/L — AB (ref 3.5–5.1)
Sodium: 137 mmol/L (ref 135–145)
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
## 2019-02-06 ENCOUNTER — Emergency Department (HOSPITAL_COMMUNITY): Payer: Self-pay

## 2019-02-06 LAB — CBG MONITORING, ED: Glucose-Capillary: 274 mg/dL — ABNORMAL HIGH (ref 70–99)

## 2019-02-06 MED ORDER — IOHEXOL 300 MG/ML  SOLN
100.0000 mL | Freq: Once | INTRAMUSCULAR | Status: AC | PRN
  Administered 2019-02-06: 100 mL via INTRAVENOUS

## 2019-02-06 MED ORDER — ONDANSETRON 4 MG PO TBDP: 4.0000 mg | ORAL_TABLET | Freq: Four times a day (QID) | ORAL | 0 refills | Status: DC | PRN

## 2019-02-06 MED ORDER — ONDANSETRON HCL 4 MG/2ML IJ SOLN
4.0000 mg | Freq: Once | INTRAMUSCULAR | Status: AC
  Administered 2019-02-06: 4 mg via INTRAVENOUS
  Filled 2019-02-06: qty 2

## 2019-02-06 MED ORDER — OXYCODONE-ACETAMINOPHEN 5-325 MG PO TABS: 1.0000 | ORAL_TABLET | ORAL | 0 refills | Status: DC | PRN

## 2019-02-06 MED ORDER — SODIUM CHLORIDE 0.9 % IV BOLUS (SEPSIS)
1000.0000 mL | Freq: Once | INTRAVENOUS | Status: AC
  Administered 2019-02-06: 1000 mL via INTRAVENOUS

## 2019-02-06 MED ORDER — MORPHINE SULFATE (PF) 4 MG/ML IV SOLN
4.0000 mg | Freq: Once | INTRAVENOUS | Status: AC
  Administered 2019-02-06: 4 mg via INTRAVENOUS
  Filled 2019-02-06: qty 1
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
## 2019-02-06 NOTE — ED Notes (Signed)
Pt transported to CT ?

## 2019-02-06 NOTE — Discharge Instructions (Addendum)
Please use your incentive spirometer every 1-2 hours while awake for the next 2 weeks to prevent pneumonia.  Your imaging today showed no acute injury.  You have likely bruised your right ribs.  You do have pulmonary nodules that should be followed by a primary care provider and reimaged over a year.  Your blood sugar was elevated but came down with IV fluids.  Please take your insulin as prescribed.   Steps to find a Primary Care Provider (PCP):  Call [phone] or [phone] to access "[facility] Find a Doctor Service."  2.  You may also go on the [facility] website at [email or web address]  3.  [facility] also frequently accepts new patients.  [facility]  [address] [place] [address] [phone]  4.  There are also multiple [facility], [facility] and [facility] practices throughout the [place] that are frequently accepting new patients. You may find a clinic that is close to your home and contact them.  [facility] [email or web address] [phone]  [facility] [email or web address]  [email or web address] [phone]  [facility] [email or web address] [phone]  5.  Local Health Departments also can provide primary care services.  [facility]  [address] [place] [address] [phone]  [facility] [address] [place] [address] [phone]  [facility] [address] [place] [address]  [place] [address] [phone]

## 2019-02-06 NOTE — ED Notes (Signed)
Pt discharged from ED; instructions provided and scripts given; Pt encouraged to return to ED if symptoms worsen and to f/u with PCP; Pt verbalized understanding of all instructions 

## 2019-02-12 ENCOUNTER — Other Ambulatory Visit: Payer: Self-pay

## 2019-02-12 ENCOUNTER — Ambulatory Visit (INDEPENDENT_AMBULATORY_CARE_PROVIDER_SITE_OTHER): Payer: Self-pay | Admitting: Internal Medicine

## 2019-02-12 ENCOUNTER — Encounter: Payer: Self-pay | Admitting: Internal Medicine

## 2019-02-12 VITALS — BP 144/68 | HR 71 | Temp 98.0°F | Ht 64.5 in | Wt 238.0 lb

## 2019-02-12 DIAGNOSIS — R911 Solitary pulmonary nodule: Secondary | ICD-10-CM | POA: Insufficient documentation

## 2019-02-12 DIAGNOSIS — W19XXXA Unspecified fall, initial encounter: Secondary | ICD-10-CM | POA: Insufficient documentation

## 2019-02-12 DIAGNOSIS — E1142 Type 2 diabetes mellitus with diabetic polyneuropathy: Secondary | ICD-10-CM

## 2019-02-12 DIAGNOSIS — W19XXXD Unspecified fall, subsequent encounter: Secondary | ICD-10-CM

## 2019-02-12 LAB — POCT GLYCOSYLATED HEMOGLOBIN (HGB A1C): Hemoglobin A1C: 11 % — AB (ref 4.0–5.6)

## 2019-02-12 LAB — GLUCOSE, CAPILLARY: Glucose-Capillary: 254 mg/dL — ABNORMAL HIGH (ref 70–99)

## 2019-02-12 MED ORDER — CYCLOBENZAPRINE HCL 5 MG PO TABS: 5.0000 mg | ORAL_TABLET | Freq: Three times a day (TID) | ORAL | 0 refills | Status: DC | PRN

## 2019-02-12 MED ORDER — METFORMIN HCL ER 500 MG PO TB24: 500.0000 mg | ORAL_TABLET | Freq: Every day | ORAL | 3 refills | Status: DC

## 2019-02-12 MED FILL — CYCLOBENZAPRINE 5 MG TABLET: 5 | 5 days supply | Qty: 15 | Fill #0

## 2019-02-12 NOTE — Patient Instructions (Signed)
Amy Norris,   The pain in the right side of your body will take a few weeks to go away.  For this I have sent a muscle relaxant to the [facility] outpatient pharmacy called Flexeril.  He can take 1 tablet every 3 hours as needed for pain.  Can also use over-the-counter ibuprofen 3 times a day as needed for pain combination with the muscle relaxant.  For the pulmonary nodules, we can repeat the scan in 12 months.   I also sent metformin to the pharmacy. Please follow the titration we talked about in clinic.  Call us with any questions or concerns.  -Dr.Santos

## 2019-02-12 NOTE — Progress Notes (Signed)
   CC: Follow up for fall and pulmonary nodules  HPI:  [name] is a 53 y.o. year-old female with PMH listed below who presents to clinic for follow-up for fall and pulmonary nodules. Please see problem based assessment and plan for further details.   Past Medical History:  Diagnosis Date  . Anal fissure   . Breast mass   . Bronchitis   . Carpal tunnel syndrome, bilateral   . Cholelithiasis   . Depression   . Diabetes mellitus    Type II  . Essential hypertension   . GERD (gastroesophageal reflux disease)   . Herpes simplex type 1 infection    vaginal  . Hypokalemia    2/2 diarrhea  . Menorrhagia   . Obesity   . Polycystic ovarian disease   . Renal calculi   . Renal calculus   . Sleep apnea, obstructive    Review of Systems:   Review of Systems  Constitutional: Negative for chills, fever, malaise/fatigue and weight loss.  Respiratory: Negative for cough and shortness of breath.   Gastrointestinal: Negative for abdominal pain, constipation, diarrhea, nausea and vomiting.  Musculoskeletal: Positive for falls, joint pain and myalgias.  Neurological: Negative for dizziness, sensory change, weakness and headaches.    Physical Exam: Vitals:   02/12/19 1011  BP: (!) 144/68  Pulse: 71  Temp: 98 F (36.7 C)  TempSrc: Oral  SpO2: 100%  Weight: 238 lb (108 kg)  Height: 5' 4.5" (1.638 m)   General: Obese female, well-appearing, no acute distress Cardiac: regular rate and rhythm, nl S1/S2, no murmurs, rubs or gallops, chest wall appears grossly normal without evidence of trauma but tender to palpation over the right ribs below right breast Pulm: CTAB, no wheezes or crackles, no increased work of breathing on room air  Ext: warm and well perfused, no peripheral edema, no bruising or lesions noted   Assessment & Plan:   See Encounters Tab for problem based charting.  Patient discussed with Dr. Cleda Daub

## 2019-02-12 NOTE — Assessment & Plan Note (Signed)
Patient was found to have 2 pulmonary nodules at the bilateral lung bases with the largest one measuring 3 mm.  This was an incidental finding.  She is a former smoker, reports quitting years ago, and did not smoke for very long "maybe 1 year." States 1 pack would last her 1 month.  She does live with a smoker and has been exposed to second hand smoke for the past 7 years. Previous chest imaging report from 2002 and 2007 without nodules. Imaging unavailable. She is likely low risk for malignancy but recommended repeat noncontrast chest CT in 12 months for patient reassurance.

## 2019-02-12 NOTE — Assessment & Plan Note (Signed)
Amy Norris sustained a fall on 2/6 while cleaning outside her trailer after heavy rain.  She describes slipping and falling on her R side onto a metal ramp.  No prodromal symptoms prior to the fall.  She has been having right chest wall pain and right leg pain since then.  She was seen in the ED on 2/7.  She had imaging of her chest, abdomen/pelvis, and left hand and knee that did not reveal any fractures or dislocations.  She was discharged home on oxycodone 5 mg q8h PRN x 3 days (#12 tablets) which has not been helping with the pain.  She is tender to palpation over ribs below the right breast, otherwise her exam is unremarkable.  We will try a muscle relaxant for pain. Also recommended OTC ibuprofen TID PRN. Sent Flexeril 5 mg every 8 hours as needed for pain (#15 tablets, no RF).  [place] database reviewed and appropriate.

## 2019-02-12 NOTE — Assessment & Plan Note (Signed)
[name] has a history of insulin-dependent diabetes and reports not taking her insulin consistently because she does not like administering the medication every day.  She self discontinued all of her medications other than Prozac which she continues to take.  She reports this is mostly due to financial difficulty as she is uninsured as well as not wanting to take pills every day.  A1c today is 11 from 10.1 nine months ago.  She is agreeable to starting metformin today (has normal renal function).  This has caused nausea and vomiting in the past, will do slow titration as below. - Metformin 500 mg QD x 7 days, 500 mg BID x 7 days, 1000 AM/500 PM x 7 days, and then 1000 mg BID  - First available appt with PCP

## 2019-02-12 NOTE — Progress Notes (Signed)
Internal Medicine Clinic Attending  Case discussed with Dr. Santos-Sanchez at the time of the visit.  We reviewed the resident's history and exam and pertinent patient test results.  I agree with the assessment, diagnosis, and plan of care documented in the resident's note.    

## 2019-02-15 ENCOUNTER — Ambulatory Visit: Payer: Self-pay | Admitting: Pharmacist

## 2019-02-15 ENCOUNTER — Encounter: Payer: Self-pay | Admitting: Internal Medicine

## 2019-02-15 ENCOUNTER — Ambulatory Visit (INDEPENDENT_AMBULATORY_CARE_PROVIDER_SITE_OTHER): Payer: Self-pay | Admitting: Internal Medicine

## 2019-02-15 ENCOUNTER — Other Ambulatory Visit: Payer: Self-pay

## 2019-02-15 DIAGNOSIS — E1142 Type 2 diabetes mellitus with diabetic polyneuropathy: Secondary | ICD-10-CM

## 2019-02-15 DIAGNOSIS — Z79899 Other long term (current) drug therapy: Secondary | ICD-10-CM

## 2019-02-15 DIAGNOSIS — Z7984 Long term (current) use of oral hypoglycemic drugs: Secondary | ICD-10-CM

## 2019-02-15 DIAGNOSIS — I10 Essential (primary) hypertension: Secondary | ICD-10-CM

## 2019-02-15 DIAGNOSIS — Z87891 Personal history of nicotine dependence: Secondary | ICD-10-CM

## 2019-02-15 DIAGNOSIS — Z9114 Patient's other noncompliance with medication regimen: Secondary | ICD-10-CM

## 2019-02-15 DIAGNOSIS — E7800 Pure hypercholesterolemia, unspecified: Secondary | ICD-10-CM

## 2019-02-15 MED ORDER — METFORMIN HCL ER 500 MG PO TB24: ORAL_TABLET | ORAL | 3 refills | Status: DC

## 2019-02-15 MED ORDER — LISINOPRIL 10 MG PO TABS: 10.0000 mg | ORAL_TABLET | Freq: Every day | ORAL | 1 refills | Status: DC

## 2019-02-15 MED ORDER — ATORVASTATIN CALCIUM 40 MG PO TABS: 40.0000 mg | ORAL_TABLET | Freq: Every day | ORAL | 1 refills | Status: DC

## 2019-02-15 NOTE — Patient Instructions (Signed)
Amy Norris we will restart your medications for your blood pressure and cholesterol today.  Please let me know if you have a problem getting the Metformin for your diabetes you will need to start this right away so we can follow-up on this and get your blood sugars under control.

## 2019-02-15 NOTE — Assessment & Plan Note (Signed)
Pt seen in [facility] after a fall.  At that time she admitted to stopping all her medical therapy for diabetes but agreed to start back on metformin.  Hasn't taken any medicine in a few months.  Has not picked up the metformin that she was prescribed in the [facility] clinic.    -I will start her back on metformin alone, encouraged her to do the ramp-up as prescribed and [facility]

## 2019-02-15 NOTE — Assessment & Plan Note (Addendum)
Patient has been off of atorvastatin for at least 2 or 3 months.  She stopped her medications because she did not feel like taking them anymore.  -Restart atorvastatin 40 mg

## 2019-02-15 NOTE — Assessment & Plan Note (Addendum)
Patient reports she has not been taking any of her medicine for months.  In the past when she was taking her lisinopril her blood pressure was controlled.    -Restart lisinopril 10 mg daily

## 2019-02-15 NOTE — Progress Notes (Signed)
CC: T2DM, HTN, HLD follow up  HPI:  [name] is a 53 y.o. female with PMH below.  Today we will address T2DM, HTN, HLD follow up   Please see A&P for status of the patient's chronic medical conditions  Past Medical History:  Diagnosis Date  . Anal fissure   . Breast mass   . Bronchitis   . Carpal tunnel syndrome, bilateral   . Cholelithiasis   . Depression   . Diabetes mellitus    Type II  . Essential hypertension   . GERD (gastroesophageal reflux disease)   . Herpes simplex type 1 infection    vaginal  . Hypokalemia    2/2 diarrhea  . Menorrhagia   . Obesity   . Polycystic ovarian disease   . Renal calculi   . Renal calculus   . Sleep apnea, obstructive    Review of Systems:  ROS: Pulmonary: pt denies increased work of breathing, shortness of breath,  Cardiac: pt denies palpitations, chest pain,  Abdominal: pt denies abdominal pain, nausea, vomiting, or diarrhea   Physical Exam:  Vitals:   02/15/19 1433  BP: (!) 142/77  Pulse: 84  Temp: 98.6 F (37 C)  TempSrc: Oral  SpO2: 97%  Weight: 235 lb 12.8 oz (107 kg)  Height: 5\' 6"  (1.676 m)   Cardiac: normal rate and rhythm, clear s1 and s2, no murmurs, rubs or gallops Pulmonary: CTAB, not in distress Abdominal: non distended abdomen, soft and nontender Extremities: no LE edema Psych: Alert, conversant, in good spirits   Social History   Socioeconomic History  . Marital status: Married    Spouse name: Not on file  . Number of children: Not on file  . Years of education: Not on file  . Highest education level: Not on file  Occupational History  . Occupation: Brewing technologist: [employer]  Social Needs  . Financial resource strain: Not on file  . Food insecurity:    Worry: Not on file    Inability: Not on file  . Transportation needs:    Medical: Not on file    Non-medical: Not on file  Tobacco Use  . Smoking status: Former Smoker    Types: Cigarettes    Last attempt to quit:  12/30/1978    Years since quitting: 40.1  . Smokeless tobacco: Never Used  Substance and Sexual Activity  . Alcohol use: No    Alcohol/week: 0.0 standard drinks  . Drug use: No  . Sexual activity: Never  Lifestyle  . Physical activity:    Days per week: Not on file    Minutes per session: Not on file  . Stress: Not on file  Relationships  . Social connections:    Talks on phone: Not on file    Gets together: Not on file    Attends religious service: Not on file    Active member of club or organization: Not on file    Attends meetings of clubs or organizations: Not on file    Relationship status: Not on file  . Intimate partner violence:    Fear of current or ex partner: Not on file    Emotionally abused: Not on file    Physically abused: Not on file    Forced sexual activity: Not on file  Other Topics Concern  . Not on file  Social History Narrative    Smoked many years ago drinks 1-2 beers on Friday and Saturdays no illegal drug  use. Married and lives with her husband and sister-in-law son daughter and grandson.   Financial assistance approved for 100% discount at [facility] and has [facility] card per Amy Norris as of August 06, 2010 6:27 PM   Family History  Problem Relation Age of Onset  . Hypertension Mother   . Diabetes Father     Assessment & Plan:   See Encounters Tab for problem based charting.  Patient discussed with Dr. Oswaldo Norris

## 2019-02-16 ENCOUNTER — Telehealth: Payer: Self-pay | Admitting: *Deleted

## 2019-02-16 NOTE — Progress Notes (Signed)
Internal Medicine Clinic Attending  Case discussed with Dr. Winfrey  at the time of the visit.  We reviewed the resident's history and exam and pertinent patient test results.  I agree with the assessment, diagnosis, and plan of care documented in the resident's note.  

## 2019-02-16 NOTE — Progress Notes (Signed)
Patient was seen today in a co-visit with Dr. Frances Furbish. No changes were made today due to patient stopping her medications, in the process of re-initiating therapy. Will follow up and assist in care of patient as needed. See documentation under Dr. Starr Sinclair visit for details.

## 2019-02-16 NOTE — Addendum Note (Signed)
Addended by: Erlinda Hong T on: 02/16/2019 09:29 AM   Modules accepted: Level of Service

## 2019-02-16 NOTE — Telephone Encounter (Signed)
received fax from [facility] clarification for rx written by DrSantos on 2/14 for Metformin.   Pt was also seen on 2/17 by pcp and given rx for metformin 500 mg 24hr  (Take 500mg  daily with breakfast for one week. Increase to twice daily on week two. Then increase to 1000mg  daily with breakfast for week three. Increase to 1000mg  twice daily on week 4 and beyond). Rx phoned into pharmacy (with read back x 2).  Rx unable to be sent electronically due to number of characters in sig.  Pharmacy would like to change the qty to #120. Verbal authorization given-will make pcp aware.Amy Spittle Cassady2/18/202010:36 AM    Call made to patient to make her aware that rx phoned into pharmacy.Criss Alvine, Cypress Hinkson Cassady2/18/202010:40 AM

## 2019-04-02 ENCOUNTER — Other Ambulatory Visit: Payer: Self-pay | Admitting: Internal Medicine

## 2019-04-02 DIAGNOSIS — B9789 Other viral agents as the cause of diseases classified elsewhere: Principal | ICD-10-CM

## 2019-04-02 DIAGNOSIS — J069 Acute upper respiratory infection, unspecified: Secondary | ICD-10-CM

## 2019-09-08 ENCOUNTER — Encounter: Payer: Self-pay | Admitting: Internal Medicine

## 2019-09-08 ENCOUNTER — Ambulatory Visit (INDEPENDENT_AMBULATORY_CARE_PROVIDER_SITE_OTHER): Payer: Self-pay | Admitting: Internal Medicine

## 2019-09-08 ENCOUNTER — Other Ambulatory Visit: Payer: Self-pay

## 2019-09-08 VITALS — Temp 97.9°F | Ht 67.0 in | Wt 222.1 lb

## 2019-09-08 DIAGNOSIS — R55 Syncope and collapse: Secondary | ICD-10-CM

## 2019-09-08 DIAGNOSIS — Z23 Encounter for immunization: Secondary | ICD-10-CM

## 2019-09-08 DIAGNOSIS — R42 Dizziness and giddiness: Secondary | ICD-10-CM

## 2019-09-08 DIAGNOSIS — Z87891 Personal history of nicotine dependence: Secondary | ICD-10-CM

## 2019-09-08 DIAGNOSIS — E1142 Type 2 diabetes mellitus with diabetic polyneuropathy: Secondary | ICD-10-CM

## 2019-09-08 DIAGNOSIS — Z794 Long term (current) use of insulin: Secondary | ICD-10-CM

## 2019-09-08 LAB — GLUCOSE, CAPILLARY: Glucose-Capillary: 272 mg/dL — ABNORMAL HIGH (ref 70–99)

## 2019-09-08 MED ORDER — METFORMIN HCL ER 500 MG PO TB24: ORAL_TABLET | ORAL | 1 refills | Status: DC

## 2019-09-08 MED ORDER — SODIUM CHLORIDE 0.9 % IV SOLN
INTRAVENOUS | Status: DC
  Administered 2019-09-08: 500 mL/h via INTRAVENOUS

## 2019-09-08 NOTE — Progress Notes (Signed)
CC: Presyncope, T2DM  HPI:  Amy Norris is a 53 y.o. female with PMH below.  Today we will address presyncope, T2DM   Please see A&P for status of the patient's chronic medical conditions  Past Medical History:  Diagnosis Date  . Anal fissure   . Breast mass   . Bronchitis   . Carpal tunnel syndrome, bilateral   . Cholelithiasis   . Depression   . Diabetes mellitus    Type II  . Essential hypertension   . GERD (gastroesophageal reflux disease)   . Herpes simplex type 1 infection    vaginal  . Hypokalemia    2/2 diarrhea  . Menorrhagia   . Obesity   . Polycystic ovarian disease   . Renal calculi   . Renal calculus   . Sleep apnea, obstructive    Review of Systems:  ROS: Pulmonary: pt denies increased work of breathing, shortness of breath,  Cardiac: pt denies palpitations, chest pain,  Abdominal: pt denies abdominal pain, nausea, vomiting, or diarrhea  Physical Exam:  Vitals:   09/08/19 1407  Temp: 97.9 F (36.6 C)  TempSrc: Oral  SpO2: 98%  Weight: 222 lb 1.6 oz (100.7 kg)  Height: 5\' 7"  (1.702 m)   Cardiac: JVD flat, normal rate and rhythm, clear s1 and s2, no murmurs, rubs or gallops, no LE edema Pulmonary: CTAB, not in distress Abdominal: non distended abdomen, soft and nontender Psych: Alert, conversant, in good spirits Neuro: II:  Visual fields intact; III,IV, VI: Ptosis not present, EOMI  V,VII: No facial droop. Facial temp sensation equal bilaterally VIII: hearing intact to voice IX,X: Palate rises symmetrically XI: Symmetric shoulder shrug XII: midline tongue extension Motor: RUE and RLE 5/5 LUE 5/5 LLE 5/5 Sensory: Temp and light touch intact x 4 Cerebellar: normal FNF and HS Gait: Deferred     Social History   Socioeconomic History  . Marital status: Married    Spouse name: Not on file  . Number of children: Not on file  . Years of education: Not on file  . Highest education level: Not on file  Occupational History   . Occupation: Public house manager: [employer]  Social Needs  . Financial resource strain: Not on file  . Food insecurity    Worry: Not on file    Inability: Not on file  . Transportation needs    Medical: Not on file    Non-medical: Not on file  Tobacco Use  . Smoking status: Former Smoker    Types: Cigarettes    Quit date: 12/30/1978    Years since quitting: 40.7  . Smokeless tobacco: Never Used  Substance and Sexual Activity  . Alcohol use: No    Alcohol/week: 0.0 standard drinks  . Drug use: No  . Sexual activity: Never  Lifestyle  . Physical activity    Days per week: Not on file    Minutes per session: Not on file  . Stress: Not on file  Relationships  . Social Herbalist on phone: Not on file    Gets together: Not on file    Attends religious service: Not on file    Active member of club or organization: Not on file    Attends meetings of clubs or organizations: Not on file    Relationship status: Not on file  . Intimate partner violence    Fear of current or ex partner: Not on file    Emotionally abused:  Not on file    Physically abused: Not on file    Forced sexual activity: Not on file  Other Topics Concern  . Not on file  Social History Narrative    Smoked many years ago drinks 1-2 beers on Friday and Saturdays no illegal drug use. Married and lives with her husband and sister-in-law son daughter and grandson.   [facility] and [facility] Rudell [facility]be[facility] as of August 06, 2010 6:27 PM    Family History  Problem Relation Age of Onset  . Hypertension Mother   . Diabetes Father     Assessment & Plan:   See Encounters Tab for problem based charting.  Patient discussed with Dr. Antony ContrasGuilloud

## 2019-09-08 NOTE — Assessment & Plan Note (Addendum)
Has been off all medications for last 4-5 months.  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  Went back to work yesterday felt very dizzy. Checked her meter last night and it was 442.  Last night was the first dose of insulin she took in about 4-5 months. This morning she checked it and it was 160.  She took two doses of 60 units four hours apart of 70/30 last night.  She reports very frequent urination, feels like she can't drink enough water/soda to stay hydrated.  Drinking less sodas but still drinking them, trying to drink more water.  Since taking the insulin last night feels like she is urinating less.  Has insulin 70/30 at home around 10-15 pens unsure of the date on them.  No nausea or vomiting.  Feels like her vision is blurry since being off diabetes treatment.  Her neurological exam is normal today.    -will restart just the metformin and insulin 70/30 today at her old dose of 40 units in the morning and 45 in the evening. -counseled her again on importance of taking diabetes therapy even if she is not symptomatic -she will need close follow up

## 2019-09-08 NOTE — Assessment & Plan Note (Addendum)
Pt is again off all medications.  She reports not taking her diabetes seriously because she doesn't have much symptoms from it.  Not depressed, has taken herself off fluoxetine as well. Spent time counseling her about the damage it is doing to her body even if she is asymptomatic.  Talked about it's contribution to her dizziness.    -restart metformin ramp it up and insulin 70/30 40u--45u  -follow up in 2 weeks -check a1c and bmp today

## 2019-09-08 NOTE — Patient Instructions (Addendum)
Amy Norris, your diabetes is been under poor control this is caused her to become dizzy and lightheaded.  We gave you some IV fluids today.  We will restart your insulin and metformin today.  I will give you a sample of your 70/30 insulin.  Please check your pens at home to make sure they are not expired.  If they are please let me know and I will need you to write you a new prescription.  I will need you to check your blood sugar at home at least twice a day once first thing in the morning before breakfast and once before dinner.

## 2019-09-09 LAB — BMP8+ANION GAP
Anion Gap: 17 mmol/L (ref 10.0–18.0)
BUN/Creatinine Ratio: 23 (ref 9–23)
BUN: 15 mg/dL (ref 6–24)
CO2: 21 mmol/L (ref 20–29)
Calcium: 9.7 mg/dL (ref 8.7–10.2)
Chloride: 99 mmol/L (ref 96–106)
Creatinine, Ser: 0.64 mg/dL (ref 0.57–1.00)
GFR calc Af Amer: 118 mL/min/{1.73_m2} (ref >59)
GFR calc non Af Amer: 102 mL/min/{1.73_m2} (ref >59)
Glucose: 249 mg/dL — ABNORMAL HIGH (ref 65–99)
Potassium: 4.2 mmol/L (ref 3.5–5.2)
Sodium: 137 mmol/L (ref 134–144)

## 2019-09-09 LAB — HEMOGLOBIN A1C
Est. average glucose Bld gHb Est-mCnc: 309 mg/dL
Hgb A1c MFr Bld: 12.4 % — ABNORMAL HIGH (ref 4.8–5.6)

## 2019-09-09 NOTE — Progress Notes (Signed)
Internal Medicine Clinic Attending  Case discussed with Dr. Winfrey  at the time of the visit.  We reviewed the resident's history and exam and pertinent patient test results.  I agree with the assessment, diagnosis, and plan of care documented in the resident's note.  

## 2019-11-04 ENCOUNTER — Other Ambulatory Visit: Payer: Self-pay | Admitting: *Deleted

## 2019-11-04 DIAGNOSIS — E1142 Type 2 diabetes mellitus with diabetic polyneuropathy: Secondary | ICD-10-CM

## 2019-11-04 NOTE — Telephone Encounter (Signed)
Received faxed refill request from pt's pharmacy for Glucophage XR.  Will send request to pcp.  Please review sig to reflect pt's current dose (titration?) and confirm medication is extended release.Regenia Skeeter, Acen Craun Cassady11/5/20209:09 AM

## 2019-11-05 MED ORDER — METFORMIN HCL ER 500 MG PO TB24: 1000.0000 mg | ORAL_TABLET | Freq: Two times a day (BID) | ORAL | 2 refills | Status: DC

## 2019-11-05 NOTE — Telephone Encounter (Signed)
refilled 

## 2019-12-22 ENCOUNTER — Other Ambulatory Visit: Payer: Self-pay | Admitting: Internal Medicine

## 2019-12-22 DIAGNOSIS — J069 Acute upper respiratory infection, unspecified: Secondary | ICD-10-CM

## 2020-01-12 IMAGING — CT CT ABD-PELV W/ CM
3 of 5 series · 13 of 36 positions shown, 16 images · IV contrast (Omni 300)
Comparison: Chest and right rib radiographs from 1 day prior.

CLINICAL DATA: Fall this afternoon with right chest wall and
abdominal pain.

EXAM:
CT CHEST, ABDOMEN, AND PELVIS WITH CONTRAST
TECHNIQUE: Multidetector CT imaging of the chest, abdomen and pelvis was
performed following the standard protocol during bolus
administration of intravenous contrast.
CONTRAST:  100mL OMNIPAQUE IOHEXOL 300 MG/ML  SOLN

[Series 3: cap with 5mm st · axial · 0.89mm/px · z∈[+762,+1302]mm · 9 of 136 slices shown, 12 images]
[im 14/136  mediastinal]
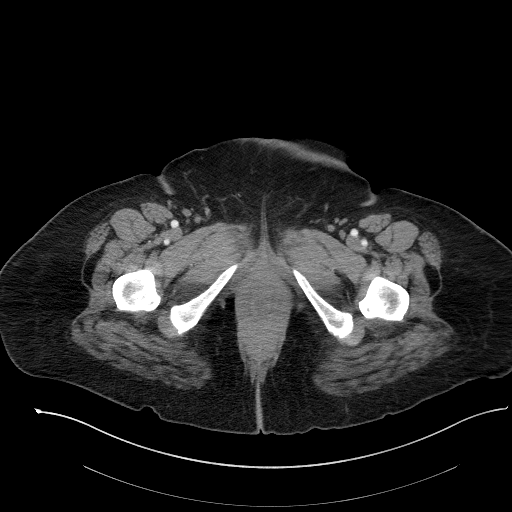
[im 14/136  lung]
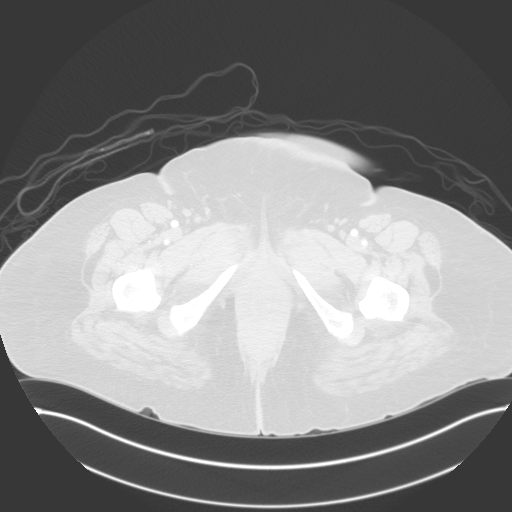
[im 28/136  lung]
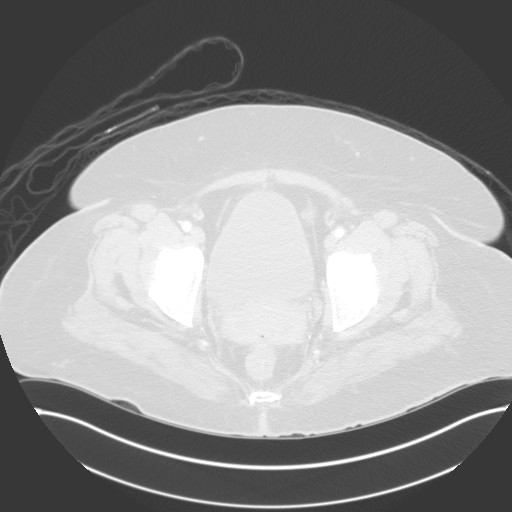
[im 41/136  lung]
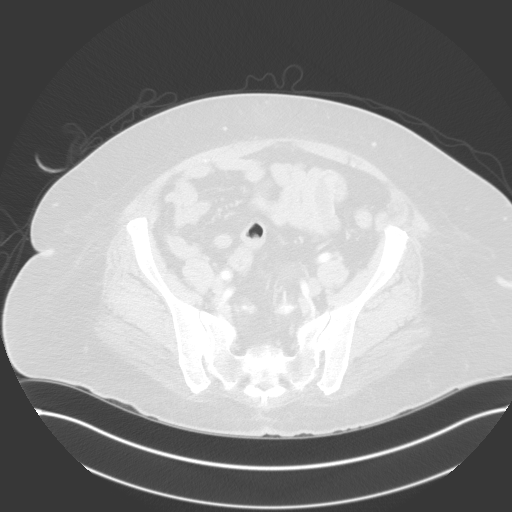
[im 55/136  lung]
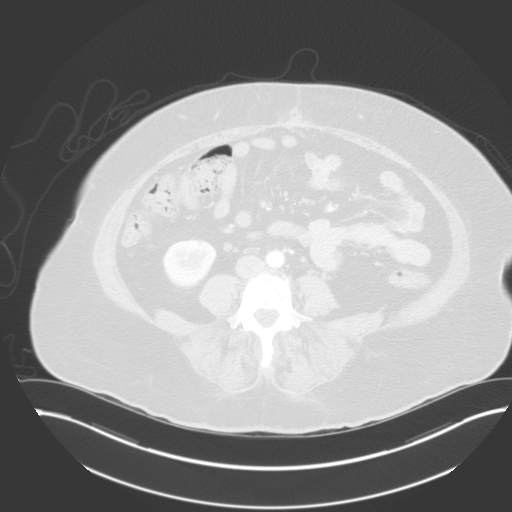
[im 68/136  mediastinal]
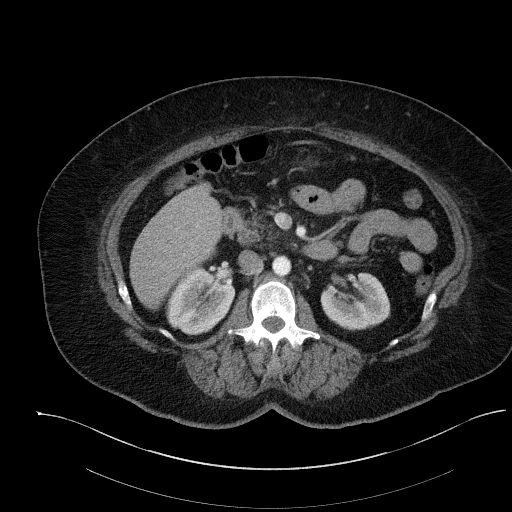
[im 68/136  lung]
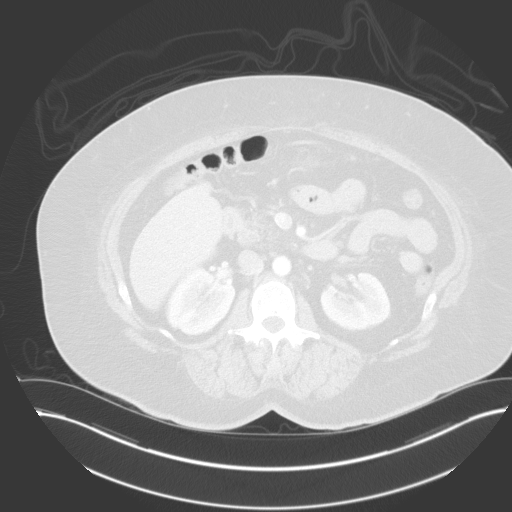
[im 82/136  lung]
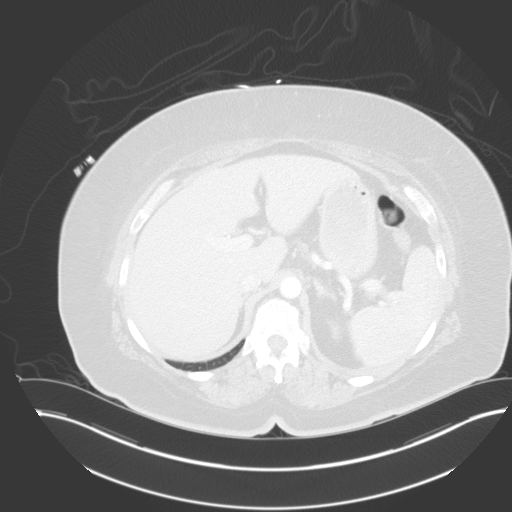
[im 95/136  lung]
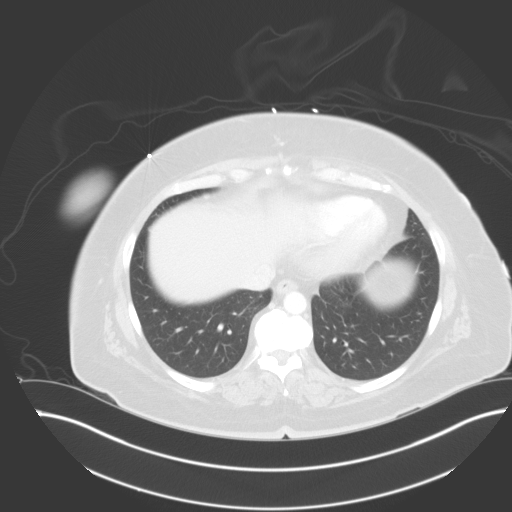
[im 109/136  lung]
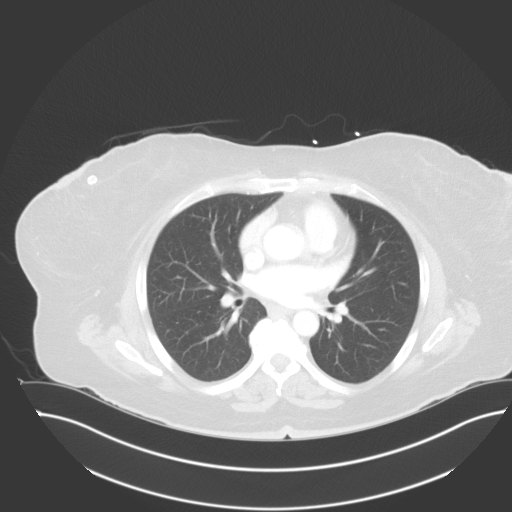
[im 122/136  mediastinal]
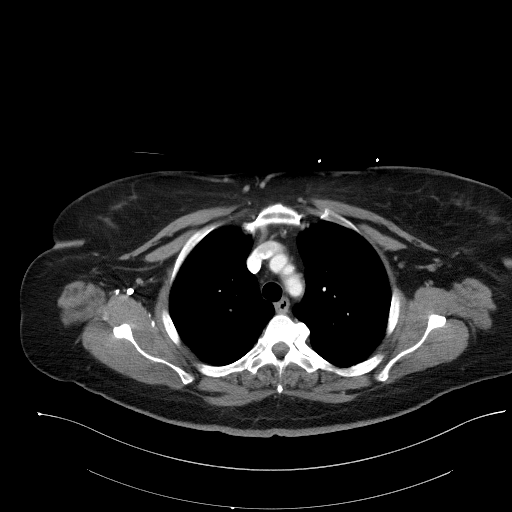
[im 122/136  lung]
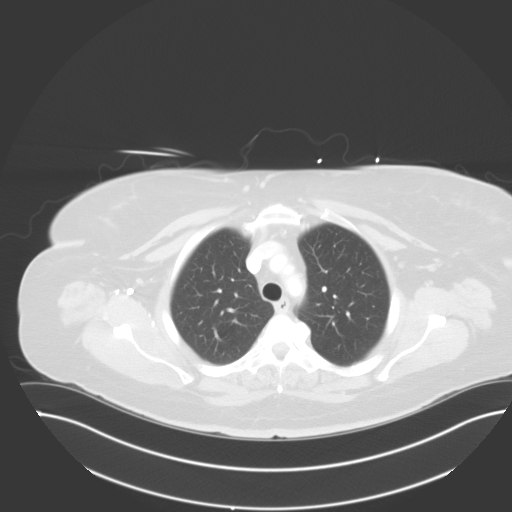

[Series 5: lung · axial · 0.87mm/px · 1 of 155 slices shown]
[im 13/155  lung]
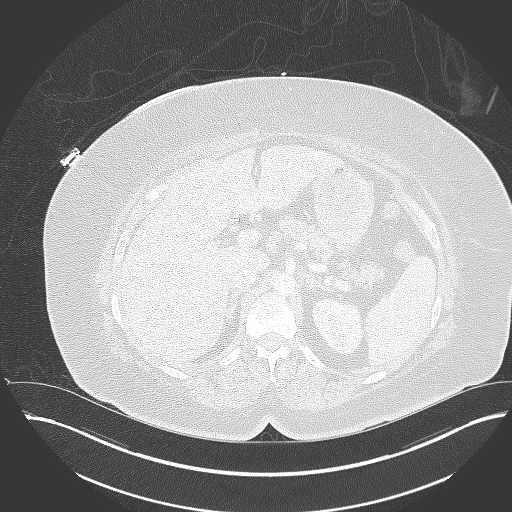

[Series 6: cap with 3mm st cor · coronal · 0.98mm/px · 3 of 168 slices shown]
[im 34/168  lung]
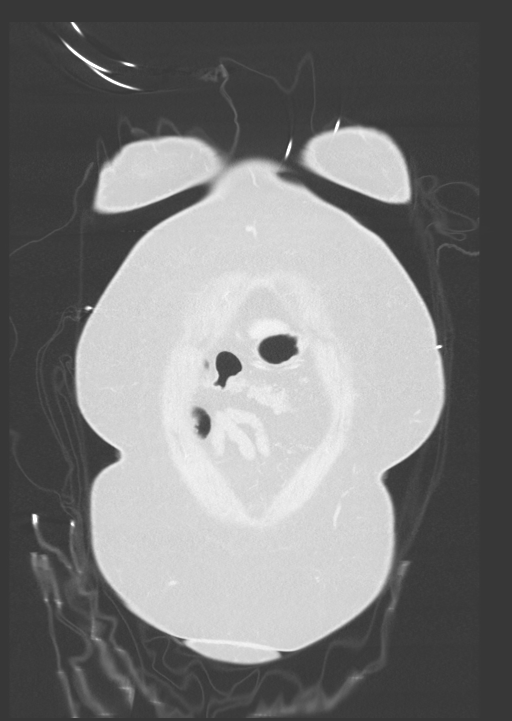
[im 67/168  lung]
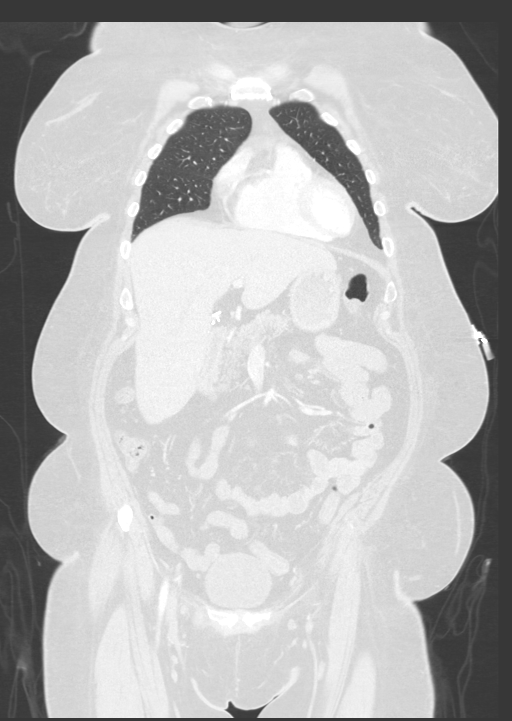
[im 101/168  lung]
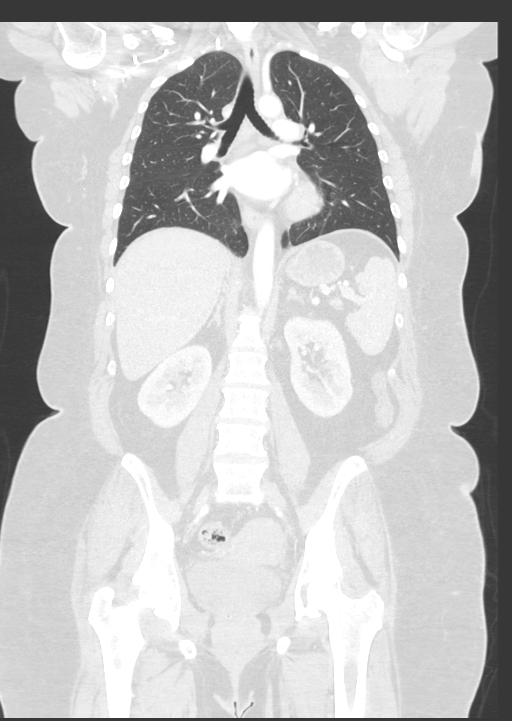

[13 of 36 positions shown; findings below may reference images not displayed]

FINDINGS: CT CHEST FINDINGS

Cardiovascular: Normal heart size. No significant pericardial
fluid/thickening. Mildly atherosclerotic nonaneurysmal thoracic
aorta. No evidence of acute thoracic aortic injury. Normal caliber
pulmonary arteries. No central pulmonary emboli.

Mediastinum/Nodes: No pneumomediastinum. No mediastinal hematoma. No
discrete thyroid nodules. Unremarkable esophagus. No axillary,
mediastinal or hilar lymphadenopathy.

Lungs/Pleura: No pneumothorax. No pleural effusion. No acute
consolidative airspace disease or lung masses. Two scattered 3 mm
solid pulmonary nodules at the bilateral lung bases, for example in
right lower lobe (series 5/image 110).

Musculoskeletal: No aggressive appearing focal osseous lesions. No
fracture detected in the chest. Mild thoracic spondylosis.

CT ABDOMEN PELVIS FINDINGS

Hepatobiliary: Normal liver with no liver laceration or mass.
Cholecystectomy. No biliary ductal dilatation.

Pancreas: Normal, with no laceration, mass or duct dilation.

Spleen: Normal size. No laceration or mass.

Adrenals/Urinary Tract: Normal adrenals. No hydronephrosis. No renal
laceration. Subcentimeter hypodense lower right renal cortical
lesion is too small to characterize and requires no follow-up.
Normal bladder.

Stomach/Bowel: Grossly normal stomach. Normal caliber small bowel
with no small bowel wall thickening. Normal appendix. Normal large
bowel with no diverticulosis, large bowel wall thickening or
pericolonic fat stranding.

Vascular/Lymphatic: Mildly atherosclerotic nonaneurysmal abdominal
aorta. Patent portal, splenic and renal veins. No pathologically
enlarged lymph nodes in the abdomen or pelvis.

Reproductive: Grossly normal uterus.  No adnexal mass.

Other: No pneumoperitoneum, ascites or focal fluid collection.

Musculoskeletal: No aggressive appearing focal osseous lesions. No
fracture in the abdomen or pelvis. Bilateral L5 pars defects with
marked degenerative disc disease and 9 mm anterolisthesis at L5-S1.
IMPRESSION: 1. No acute traumatic injury in the chest, abdomen or pelvis.
2. Tiny solid pulmonary nodules, largest 3 mm. No follow-up needed
if patient is low-risk (and has no known or suspected primary
neoplasm). Non-contrast chest CT can be considered in 12 months if
patient is high-risk. This recommendation follows the consensus
statement: Guidelines for Management of Incidental Pulmonary Nodules
Detected on CT Images:From the [HOSPITAL] 3652; published
online before print (10.1148/radiol.3367626253).
3. Chronic bilateral L5 pars defects with marked degenerative disc
disease and 9 mm anterolisthesis at L5-S1.
4.  Aortic Atherosclerosis (TB71T-XT8.8).

## 2020-01-25 ENCOUNTER — Other Ambulatory Visit: Payer: Self-pay | Admitting: *Deleted

## 2020-01-25 ENCOUNTER — Ambulatory Visit (INDEPENDENT_AMBULATORY_CARE_PROVIDER_SITE_OTHER): Payer: Self-pay | Admitting: Internal Medicine

## 2020-01-25 ENCOUNTER — Other Ambulatory Visit: Payer: Self-pay

## 2020-01-25 VITALS — BP 142/68 | HR 82 | Temp 98.2°F | Ht 66.0 in | Wt 225.6 lb

## 2020-01-25 DIAGNOSIS — R195 Other fecal abnormalities: Secondary | ICD-10-CM

## 2020-01-25 DIAGNOSIS — E1142 Type 2 diabetes mellitus with diabetic polyneuropathy: Secondary | ICD-10-CM

## 2020-01-25 DIAGNOSIS — Z794 Long term (current) use of insulin: Secondary | ICD-10-CM

## 2020-01-25 DIAGNOSIS — Z9049 Acquired absence of other specified parts of digestive tract: Secondary | ICD-10-CM

## 2020-01-25 DIAGNOSIS — Z1211 Encounter for screening for malignant neoplasm of colon: Secondary | ICD-10-CM

## 2020-01-25 LAB — POCT GLYCOSYLATED HEMOGLOBIN (HGB A1C): Hemoglobin A1C: 8.3 % — AB (ref 4.0–5.6)

## 2020-01-25 LAB — GLUCOSE, CAPILLARY: Glucose-Capillary: 222 mg/dL — ABNORMAL HIGH (ref 70–99)

## 2020-01-25 MED ORDER — VICTOZA 18 MG/3ML ~~LOC~~ SOPN: PEN_INJECTOR | SUBCUTANEOUS | 0 refills | Status: DC

## 2020-01-25 MED ORDER — INSULIN PEN NEEDLE 32G X 6 MM MISC: 11 refills | Status: DC

## 2020-01-25 MED ORDER — GABAPENTIN 300 MG PO CAPS: 300.0000 mg | ORAL_CAPSULE | Freq: Three times a day (TID) | ORAL | 1 refills | Status: DC

## 2020-01-25 MED FILL — VICTOZA 18 MG/3 ML INJECT P: 18 | 30 days supply | Qty: 9 | Fill #0

## 2020-01-25 MED FILL — UNIFINE PENTIPS 6MM 31G: 31G X 6 MM | 90 days supply | Qty: 100 | Fill #0

## 2020-01-25 MED FILL — GABAPENTIN 300 MG CAPSULE: 300 | 30 days supply | Qty: 90 | Fill #0

## 2020-01-25 NOTE — Patient Instructions (Addendum)
Thank you for allowing [place] to provide your care today. Today we discussed your loose stools and type II diabetes    I have ordered the following labs for you:  TSH, complete metabolic panel.    I will call if any are abnormal.    Your hemoglobin A1C has decreased from 12 to 8.3. This is great! Please continue to take your medications as controlling your diabetes will greatly improve your health.   Today we made the following changes to your medications:   Please START taking  Victoza - inject .6mg  once daily for 7 days THEN  Increase injection to 1.2 mg daily.   Please STOP taking   Metformin xr 1000 mg   Please follow-up in two weeks to discuss glucose and loose stools.  I have sent a referral to gastroenterology for colonoscopy. They should contact you to make an appointment.     Please call the internal medicine center clinic if you have any questions or concerns, we may be able to help and keep you from a long and expensive emergency room wait. Our clinic and after hours phone number is [phone], the best time to call is Monday through Friday 9 am to 4 pm but there is always someone available 24/7 if you have an emergency. If you need medication refills please notify your pharmacy one week in advance and they will send Monday a request.

## 2020-01-25 NOTE — Assessment & Plan Note (Addendum)
Loose stools possibly secondary to metformin. This has been ongoing many years but she states it did continue when stopping metformin. Alternatively could be secondary to high fat diet with previous cholecystectomy. She also meets criteria for IBS-D.   - stop metformin, switch to victoza - given information on cholecystectomy diet  - CMP, TSH - follow-up two weeks  - referral for screening colonoscopy

## 2020-01-25 NOTE — Progress Notes (Signed)
   CC: loose   HPI:  [name] is a 54 y.o. with PMH as below.   Please see A&P for assessment of the patient's acute and chronic medical conditions.   She has been having loose stools off and on for the past several years, usually controlled with immodium. In the past two weeks her stools have increased to 4-5 times per day. She tried increasing immodium but this did not help. Stools appear brown, no blood or coffee grounds noted. She denies fever, chills, but does endorse some weight loss. She has abdominal pain prior to defecation and sometimes feels like she won't be able to hold her stool. She feels better after. She denies nausea. She has LE neuropathy chronically.  She has a history of cholecystectomy. Diet often consists of grilled chicken, macaroni, mashed potatoes, vegetables, gravy. She takes metformin er 1000 mg bid and has been taking her medications consistently. She denies changes in medications, recent sick contacts or travel.   Past Medical History:  Diagnosis Date  . Anal fissure   . Breast mass   . Bronchitis   . Carpal tunnel syndrome, bilateral   . Cholelithiasis   . Depression   . Diabetes mellitus    Type II  . Essential hypertension   . GERD (gastroesophageal reflux disease)   . Herpes simplex type 1 infection    vaginal  . Hypokalemia    2/2 diarrhea  . Menorrhagia   . Obesity   . Polycystic ovarian disease   . Renal calculi   . Renal calculus   . Sleep apnea, obstructive    Review of Systems:   10 point ROS negative except as noted in HPI  Physical Exam: Constitution: NAD, appears stated age Cardio: RRR, no m/r/g, no LE edema  Respiratory: CTA, no w/r/r Abdominal: NTTP, soft, non-distended, +BS Neuro: normal affect, a&ox3 Skin: c/d/i    Vitals:   01/25/20 0926  BP: (!) 142/68  Pulse: 82  Temp: 98.2 F (36.8 C)  TempSrc: Oral  SpO2: 100%  Weight: 225 lb 9.6 oz (102.3 kg)  Height: 5\' 6"  (1.676 m)    Assessment & Plan:    See Encounters Tab for problem based charting.  Patient discussed with Dr. 

## 2020-01-25 NOTE — Assessment & Plan Note (Signed)
She has been taking her 70/30 40U qam and 45U qhs in addition to metformin er 1000 mg bid. Glucose has been anywhere from 150-220 prior to meals during the day. A1c today 8.3 from 12 last September.   - switch metformin to victoza with ongoing diarrhea  - cont. 70/30 insulin - f/u two weeks

## 2020-01-26 LAB — CMP14 + ANION GAP
ALT: 10 IU/L (ref 0–32)
AST: 28 IU/L (ref 0–40)
Albumin/Globulin Ratio: 1.7 (ref 1.2–2.2)
Albumin: 4.2 g/dL (ref 3.8–4.9)
Alkaline Phosphatase: 63 IU/L (ref 39–117)
Anion Gap: 17 mmol/L (ref 10.0–18.0)
BUN/Creatinine Ratio: 15 (ref 9–23)
BUN: 10 mg/dL (ref 6–24)
Bilirubin Total: 0.4 mg/dL (ref 0.0–1.2)
CO2: 22 mmol/L (ref 20–29)
Calcium: 9.7 mg/dL (ref 8.7–10.2)
Chloride: 100 mmol/L (ref 96–106)
Creatinine, Ser: 0.66 mg/dL (ref 0.57–1.00)
GFR calc Af Amer: 117 mL/min/{1.73_m2} (ref >59)
GFR calc non Af Amer: 101 mL/min/{1.73_m2} (ref >59)
Globulin, Total: 2.5 g/dL (ref 1.5–4.5)
Glucose: 232 mg/dL — ABNORMAL HIGH (ref 65–99)
Potassium: 4.3 mmol/L (ref 3.5–5.2)
Sodium: 139 mmol/L (ref 134–144)
Total Protein: 6.7 g/dL (ref 6.0–8.5)

## 2020-01-26 LAB — TSH: TSH: 1.48 u[IU]/mL (ref 0.450–4.500)

## 2020-01-26 NOTE — Progress Notes (Signed)
Internal Medicine Clinic Attending  Case discussed with Dr. Seawell at the time of the visit.  We reviewed the resident's history and exam and pertinent patient test results.  I agree with the assessment, diagnosis, and plan of care documented in the resident's note.    

## 2020-02-03 ENCOUNTER — Encounter: Payer: Self-pay | Admitting: *Deleted

## 2020-02-08 ENCOUNTER — Ambulatory Visit: Payer: Self-pay | Admitting: Internal Medicine

## 2020-02-08 ENCOUNTER — Other Ambulatory Visit: Payer: Self-pay

## 2020-02-08 DIAGNOSIS — R195 Other fecal abnormalities: Secondary | ICD-10-CM

## 2020-02-08 NOTE — Assessment & Plan Note (Addendum)
Loose stools: Amy Norris reports that she has been experiencing loose stools for "many many many years.  "She states that she has at least 2-3 loose stools per day but sometimes will have a lot more depending on her diet.  Her loose stools are managed with as needed Imodium.  She was evaluated last week by my colleague on 01/25/2020 and at that visit, she was asked to discontinue metformin and start Victoza.  She has been tolerating her Victoza though she reports of decreased appetite.  Over the past several days she has been able to tolerate her meals.  Her CMP and TSH performed at her last visit was unremarkable.  Per chart review, CT abdomen pelvis performed in 2020 did not reveal any evidence of chronic pancreatitis.  She states that her loose stools does not coincide with a specific diet.  She did tell me that her father also suffered from chronic loose stools and she thinks this might be hereditary.  She does not report of any other systemic symptoms though I did not specifically ask about abdominal pain.  Her physical exams was completely benign without evidence of skin changes at the elbow.  Assessment: DDx includes IBS.  Inflammatory bowel disease, chronic pancreatitis, C. difficile, parasite infection very less likely given no evidence of hematochezia, negative abdominal imaging and no evidence of macrocytic anemia on lab findings.   We did have a thorough discussion regarding ordering complete work-up for her chronic diarrhea including fecal fat, lactoferrin, stool ova and parasite.  She stated that she wanted to wait until after her colonoscopy as she is currently uninsured.  Plan: -Follow-up colonoscopy -Advised to take Imodium as needed (not to exceed 14 mg/day).

## 2020-02-08 NOTE — Progress Notes (Signed)
   CC: Follow-up loose stools  HPI:  Amy Norris is a 54 y.o. community dwelling woman with medical history listed below who presents to follow-up on loose stools.  Please see problem based charting for further details.     Past Medical History:  Diagnosis Date  . Anal fissure   . Breast mass   . Bronchitis   . Carpal tunnel syndrome, bilateral   . Cholelithiasis   . Depression   . Diabetes mellitus    Type II  . Essential hypertension   . GERD (gastroesophageal reflux disease)   . Herpes simplex type 1 infection    vaginal  . Hypokalemia    2/2 diarrhea  . Menorrhagia   . Obesity   . Polycystic ovarian disease   . Renal calculi   . Renal calculus   . Sleep apnea, obstructive    Review of Systems:  As per HPI  Physical Exam:  Vitals:   02/08/20 1526  BP: (!) 142/75  Pulse: 85  Temp: 99.2 F (37.3 C)  TempSrc: Oral  SpO2: 97%  Weight: 223 lb 12.8 oz (101.5 kg)  Height: 5\' 7"  (1.702 m)   Physical Exam  Constitutional: She is well-developed, well-nourished, and in no distress.  Cardiovascular: Normal rate, regular rhythm and normal heart sounds.  Abdominal: Soft. Bowel sounds are normal. Distension: Due to body habitus. There is no abdominal tenderness.  Psychiatric: Mood and affect normal.    Assessment & Plan:   See Encounters Tab for problem based charting.  Patient discussed with Dr. 

## 2020-02-08 NOTE — Patient Instructions (Signed)
Ms. Wisby,  Thanks for seeing [place] today. I'm glad that your diarrhea is stable. Please follow up with the stomach doctor for the colonoscopy.  Take care! Dr. Dortha Schwalbe  Please call the internal medicine center clinic if you have any questions or concerns, we may be able to help and keep you from a long and expensive emergency room wait. Our clinic and after hours phone number is [phone], the best time to call is Monday through Friday 9 am to 4 pm but there is always someone available 24/7 if you have an emergency. If you need medication refills please notify your pharmacy one week in advance and they will send [place] a request.

## 2020-02-10 ENCOUNTER — Other Ambulatory Visit: Payer: Self-pay | Admitting: Internal Medicine

## 2020-02-10 ENCOUNTER — Encounter: Payer: Self-pay | Admitting: Internal Medicine

## 2020-02-10 DIAGNOSIS — J069 Acute upper respiratory infection, unspecified: Secondary | ICD-10-CM

## 2020-02-10 NOTE — Progress Notes (Signed)
Internal Medicine Clinic Attending  Case discussed with Dr. Agyei at the time of the visit.  We reviewed the resident's history and exam and pertinent patient test results.  I agree with the assessment, diagnosis, and plan of care documented in the resident's note.    

## 2020-02-15 ENCOUNTER — Other Ambulatory Visit: Payer: Self-pay

## 2020-02-15 ENCOUNTER — Ambulatory Visit (AMBULATORY_SURGERY_CENTER): Payer: Self-pay | Admitting: *Deleted

## 2020-02-15 VITALS — Temp 97.6°F | Ht 67.0 in | Wt 219.0 lb

## 2020-02-15 DIAGNOSIS — Z01818 Encounter for other preprocedural examination: Secondary | ICD-10-CM

## 2020-02-15 DIAGNOSIS — Z1211 Encounter for screening for malignant neoplasm of colon: Secondary | ICD-10-CM

## 2020-02-15 NOTE — Progress Notes (Signed)
Plenvu sample provided; patient has no insurance Lot [phone] exp 03/2020

## 2020-02-15 NOTE — Progress Notes (Signed)

## 2020-02-25 ENCOUNTER — Other Ambulatory Visit: Payer: Self-pay

## 2020-02-25 ENCOUNTER — Other Ambulatory Visit: Payer: Self-pay | Admitting: Internal Medicine

## 2020-02-25 ENCOUNTER — Ambulatory Visit (INDEPENDENT_AMBULATORY_CARE_PROVIDER_SITE_OTHER): Payer: Self-pay

## 2020-02-25 DIAGNOSIS — Z1159 Encounter for screening for other viral diseases: Secondary | ICD-10-CM

## 2020-02-25 LAB — SARS CORONAVIRUS 2 (TAT 6-24 HRS): SARS Coronavirus 2: NEGATIVE

## 2020-02-29 ENCOUNTER — Encounter: Payer: Self-pay | Admitting: Internal Medicine

## 2020-02-29 ENCOUNTER — Ambulatory Visit (AMBULATORY_SURGERY_CENTER): Payer: Self-pay | Admitting: Internal Medicine

## 2020-02-29 ENCOUNTER — Other Ambulatory Visit: Payer: Self-pay

## 2020-02-29 VITALS — BP 135/77 | HR 75 | Temp 96.9°F | Resp 14 | Ht 67.0 in | Wt 219.0 lb

## 2020-02-29 DIAGNOSIS — R197 Diarrhea, unspecified: Secondary | ICD-10-CM

## 2020-02-29 DIAGNOSIS — Z1211 Encounter for screening for malignant neoplasm of colon: Secondary | ICD-10-CM

## 2020-02-29 DIAGNOSIS — R195 Other fecal abnormalities: Secondary | ICD-10-CM

## 2020-02-29 MED ORDER — SODIUM CHLORIDE 0.9 % IV SOLN: 500.0000 mL | Freq: Once | INTRAVENOUS | Status: DC

## 2020-02-29 NOTE — Progress Notes (Signed)
Pt's states no medical or surgical changes since previsit or office visit.  JB - temp DT - vitals 

## 2020-02-29 NOTE — Op Note (Signed)
[facility] Patient Name: Amy Norris Procedure Date: 02/29/2020 9:20 AM MRN: [id number] Endoscopist: Wilhemina Bonito. Marina Goodell , MD Age: 54 Referring MD:  Date of Birth: 06/06/1966 Gender: Female Account #: [id number] Procedure:                Colonoscopy with biopsies Indications:              Screening for colorectal malignant neoplasm,                            Incidental diarrhea noted Medicines:                Monitored Anesthesia Care Procedure:                Pre-Anesthesia Assessment:                           - Prior to the procedure, a History and Physical                            was performed, and patient medications and                            allergies were reviewed. The patient's tolerance of                            previous anesthesia was also reviewed. The risks                            and benefits of the procedure and the sedation                            options and risks were discussed with the patient.                            All questions were answered, and informed consent                            was obtained. Prior Anticoagulants: The patient has                            taken no previous anticoagulant or antiplatelet                            agents. ASA Grade Assessment: II - A patient with                            mild systemic disease. After reviewing the risks                            and benefits, the patient was deemed in                            satisfactory condition to undergo the procedure.  After obtaining informed consent, the colonoscope                            was passed under direct vision. Throughout the                            procedure, the patient's blood pressure, pulse, and                            oxygen saturations were monitored continuously. The                            Colonoscope was introduced through the anus and                            advanced to the the cecum,  identified by                            appendiceal orifice and ileocecal valve. The                            ileocecal valve, appendiceal orifice, and rectum                            were photographed. The quality of the bowel                            preparation was excellent. The colonoscopy was                            performed without difficulty. The patient tolerated                            the procedure well. The bowel preparation used was                            SUPREP via split dose instruction. Scope In: 9:29:45 AM Scope Out: 9:44:45 AM Scope Withdrawal Time: 0 hours 11 minutes 21 seconds  Total Procedure Duration: 0 hours 15 minutes 0 seconds  Findings:                 The entire examined colon appeared normal on direct                            and retroflexion views. Biopsies for histology were                            taken with a cold forceps from the entire colon for                            evaluation of microscopic colitis. Complications:            No immediate complications. Estimated blood loss:  None. Estimated Blood Loss:     Estimated blood loss: none. Impression:               - The entire examined colon is normal on direct and                            retroflexion views. Recommendation:           - Repeat colonoscopy in 10 years for screening                            purposes.                           - Patient has a contact number available for                            emergencies. The signs and symptoms of potential                            delayed complications were discussed with the                            patient. Return to normal activities tomorrow.                            Written discharge instructions were provided to the                            patient.                           - Resume previous diet.                           - Continue present medications.                            - Await pathology results. Docia Chuck. Henrene Pastor, MD 02/29/2020 9:50:50 AM This report has been signed electronically.

## 2020-02-29 NOTE — Progress Notes (Signed)
Report to PACU, RN, vss, BBS= Clear.  

## 2020-02-29 NOTE — Progress Notes (Signed)
Called to room to assist during endoscopic procedure.  Patient ID and intended procedure confirmed with present staff. Received instructions for my participation in the procedure from the performing physician.  

## 2020-02-29 NOTE — Patient Instructions (Signed)
YOU HAD AN ENDOSCOPIC PROCEDURE TODAY AT THE  ENDOSCOPY CENTER:   Refer to the procedure report that was given to you for any specific questions about what was found during the examination.  If the procedure report does not answer your questions, please call your gastroenterologist to clarify.  If you requested that your care partner not be given the details of your procedure findings, then the procedure report has been included in a sealed envelope for you to review at your convenience later.  YOU SHOULD EXPECT: Some feelings of bloating in the abdomen. Passage of more gas than usual.  Walking can help get rid of the air that was put into your GI tract during the procedure and reduce the bloating. If you had a lower endoscopy (such as a colonoscopy or flexible sigmoidoscopy) you may notice spotting of blood in your stool or on the toilet paper. If you underwent a bowel prep for your procedure, you may not have a normal bowel movement for a few days.  Please Note:  You might notice some irritation and congestion in your nose or some drainage.  This is from the oxygen used during your procedure.  There is no need for concern and it should clear up in a day or so.  SYMPTOMS TO REPORT IMMEDIATELY:   Following lower endoscopy (colonoscopy or flexible sigmoidoscopy):  Excessive amounts of blood in the stool  Significant tenderness or worsening of abdominal pains  Swelling of the abdomen that is new, acute  Fever of 100F or higher   For urgent or emergent issues, a gastroenterologist can be reached at any hour by calling [phone].   DIET:  We do recommend a small meal at first, but then you may proceed to your regular diet.  Drink plenty of fluids but you should avoid alcoholic beverages for 24 hours.  MEDICATIONS: Continue present medications.  Please see handouts given to you by your recovery nurse.  ACTIVITY:  You should plan to take it easy for the rest of today and you should  NOT DRIVE or use heavy machinery until tomorrow (because of the sedation medicines used during the test).    FOLLOW UP: Our staff will call the number listed on your records 48-72 hours following your procedure to check on you and address any questions or concerns that you may have regarding the information given to you following your procedure. If we do not reach you, we will leave a message.  We will attempt to reach you two times.  During this call, we will ask if you have developed any symptoms of COVID 19. If you develop any symptoms (ie: fever, flu-like symptoms, shortness of breath, cough etc.) before then, please call [phone].  If you test positive for Covid 19 in the 2 weeks post procedure, please call and report this information to us.    If any biopsies were taken you will be contacted by phone or by letter within the next 1-3 weeks.  Please call us at [phone] if you have not heard about the biopsies in 3 weeks.   Thank you for allowing us to provide for your healthcare needs today.   SIGNATURES/CONFIDENTIALITY: You and/or your care partner have signed paperwork which will be entered into your electronic medical record.  These signatures attest to the fact that that the information above on your After Visit Summary has been reviewed and is understood.  Full responsibility of the confidentiality of this discharge information lies with you and/or   your care-partner. 

## 2020-03-02 ENCOUNTER — Telehealth: Payer: Self-pay | Admitting: *Deleted

## 2020-03-02 ENCOUNTER — Encounter: Payer: Self-pay | Admitting: Internal Medicine

## 2020-03-02 NOTE — Telephone Encounter (Signed)
1. Have you developed a fever since your procedure? no  2.   Have you had an respiratory symptoms (SOB or cough) since your procedure? no  3.   Have you tested positive for COVID 19 since your procedure no  4.   Have you had any family members/close contacts diagnosed with the COVID 19 since your procedure?  no   If yes to any of these questions please route to Laverna Peace, RN and Jennye Boroughs, Charity fundraiser.  Follow up Call-  Call back number 02/29/2020  Post procedure Call Back phone  # [phone]  Permission to leave phone message Yes  Some recent data might be hidden     Patient questions:  Do you have a fever, pain , or abdominal swelling? No. Pain Score  0 *  Have you tolerated food without any problems? Yes.    Have you been able to return to your normal activities? Yes.    Do you have any questions about your discharge instructions: Diet   No. Medications  No. Follow up visit  No.  Do you have questions or concerns about your Care? No.  Actions: * If pain score is 4 or above: No action needed, pain <4.

## 2020-03-06 ENCOUNTER — Other Ambulatory Visit: Payer: Self-pay | Admitting: Internal Medicine

## 2020-03-06 DIAGNOSIS — E1142 Type 2 diabetes mellitus with diabetic polyneuropathy: Secondary | ICD-10-CM

## 2020-03-07 MED FILL — VICTOZA 18 MG/3 ML INJECT P: 18 | 30 days supply | Qty: 9 | Fill #0

## 2020-03-08 ENCOUNTER — Encounter: Payer: Self-pay | Admitting: Internal Medicine

## 2020-03-08 ENCOUNTER — Ambulatory Visit: Payer: Self-pay | Admitting: Internal Medicine

## 2020-03-08 VITALS — BP 141/78 | HR 79 | Temp 98.1°F | Ht 67.0 in | Wt 224.2 lb

## 2020-03-08 DIAGNOSIS — F3340 Major depressive disorder, recurrent, in remission, unspecified: Secondary | ICD-10-CM

## 2020-03-08 DIAGNOSIS — K529 Noninfective gastroenteritis and colitis, unspecified: Secondary | ICD-10-CM

## 2020-03-08 DIAGNOSIS — Z9049 Acquired absence of other specified parts of digestive tract: Secondary | ICD-10-CM

## 2020-03-08 NOTE — Patient Instructions (Addendum)
Thank you for trusting me with your care. To recap, today we discussed the following:   Chronic Diarrhea Your recent colonoscopy was unrevealing for the cause of your chronic diarrhea.  You have had this problem by chart for 10 years, and it will take some time for me to get through the tests you have had in the past.  Once I have gone through these tests I can narrow down the test you will need in the future.  In the meantime you can increase your Imodium, do not take more than 16 mg/day. Also I have printed out information on a gluten-free diet and lactose intolerance.  If you can start these diets and monitor for improvement it will help [place] with the diagnosis.  My best,  Thurmon Fair, MD

## 2020-03-09 ENCOUNTER — Encounter: Payer: Self-pay | Admitting: Internal Medicine

## 2020-03-09 DIAGNOSIS — K8681 Exocrine pancreatic insufficiency: Secondary | ICD-10-CM | POA: Insufficient documentation

## 2020-03-09 DIAGNOSIS — K529 Noninfective gastroenteritis and colitis, unspecified: Secondary | ICD-10-CM | POA: Insufficient documentation

## 2020-03-09 MED ORDER — COLESTIPOL HCL 1 G PO TABS: 2.0000 g | ORAL_TABLET | Freq: Two times a day (BID) | ORAL | 2 refills | Status: DC

## 2020-03-09 NOTE — Progress Notes (Signed)
   CC: Chronic Diarrhea   HPI:Amy Norris is a 54 y.o. female who presents for evaluation of chronic diarrhea. Please see individual problem based A/P for details.   Depression, PHQ-9: Based on the patients    Office Visit from 03/08/2020 in [facility]  PHQ-9 Total Score  2      Past Medical History:  Diagnosis Date  . Anal fissure   . Breast mass   . Bronchitis   . Carpal tunnel syndrome, bilateral   . Cholelithiasis   . Depression   . Diabetes mellitus    Type II  . Essential hypertension   . GERD (gastroesophageal reflux disease)   . Herpes simplex type 1 infection    vaginal  . Hypokalemia    2/2 diarrhea  . Menorrhagia   . Obesity   . Polycystic ovarian disease   . Renal calculi   . Renal calculus   . Sleep apnea    no cpap  . Sleep apnea, obstructive    Review of Systems:  ROS negative except as per HPI.  Physical Exam: Vitals:   03/08/20 1400  BP: (!) 141/78  Pulse: 79  Temp: 98.1 F (36.7 C)  TempSrc: Oral  SpO2: 98%  Weight: 224 lb 3.2 oz (101.7 kg)  Height: 5\' 7"  (1.702 m)    General: Alert, nl appearance HE: Normocephalic, atraumatic , EOMI, Conjunctivae normal ENT: No congestion, no rhinorrhea moist, no exudate or erythema  Cardiovascular: Normal rate, regular rhythm.  No murmurs, rubs, or gallops Pulmonary : Effort normal, breath sounds normal. No wheezes, rales, or rhonchi Abdominal: soft, nontender,  bowel sounds present Musculoskeletal: no swelling , deformity, injury ,or tenderness in extremities, Skin: Warm, dry , no bruising, erythema, or rash Psychiatric/Behavioral:  normal mood, normal behavior   Assessment & Plan:   See Encounters Tab for problem based charting.  Patient discussed with Dr. 

## 2020-03-09 NOTE — Assessment & Plan Note (Signed)
Chronic diarrhea: Patient reports she has been experiencing diarrhea for so long she can't quite remember how many years ago it started. I showed her a [facility] stool chart and she says on most days her stool is 6 or 7. Sometimes it is a 5. Recently she had CMP, TSH, and CT abdomen/pelvis on 01/31/2019 showed normal stomach, small bowel, appendix, and  large bowel. Recent colonoscopy on 02/29/2020 was normal with recommended follow up in 10 years. - Patient is scheduled for an acute care visit, and this problem has a long history. She says she is presenting today with this chronic problem because it is now interfering with her work. She sometimes cant make it to the restroom because her diarrhea comes on so quickly. She believes she may get fired and is embarrassed. I recommend patient try to cut out dairy and gluten, and told her I would do a in-depth chart review to see what was tested. Patient does not have insurance at this time to help with cost of testing.   On chart review patient had Cholecystomy on 10/2007. Appears she has been presenting with diarrhea since early 2009. Patient was thought to have diarrhea from Metformin, taken off of this medication for 6 months, and continued to have diarrhea in 2009. She was worked up with stool culture, ova&parasite, and tested for celiac disease in 2011 ( results below). She always describes diarrhea within an hour or less of eating. Recommended to stop dairy in the past, but this did not make a difference. Patient tried on cholestyramine and reported it worked great for her in 2011. However patient said it was too expensive and stopped taking it consistently. More notes report her diarrhea was secondary to Metformin. She is on Insulin and Victoza now.  - A broad differential of chronic diarrhea includes the following: Inflammatory ( infectious and noninfectious causes) , Osmotic, Secretory, and Dysmotility.  I think it is less likely an infectious-inflammatory  cause after patient's recent biopsy of colon was normal , although patient did have positive lactoferrin in 2011. Stool evaluation was normal when symptoms started (listed below) .Less likely noninfectous-inflammatory causes ( ischemia,maligancy,toxin,IBD)  with normal colonoscopy and no history of radiation. I can not find lab work to show patient had stool test to differentiate between osmotic and secretory diarrhea. With long history and normal CT abdomen/pelvis I think it is less likely a secretory cause like carcinoid tumor, gastrinoma, glucagonoma, or VIPoma. Also likely patient would not have relief with fasting if it was a secretory diarrhea. 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 I sent prescription in for Colestipol 2g BID, should be about $40 per month.  - Recommend  increasing if diarrhea is improving but not resolved. Can titrate up by 2g once or twice daily in 1 month intervals until you reach max 16 g/daily total. - If not resolved, I recommend ordering stool osmolality. You may consider patient has a long history of diabetes and could have developed small bowel bacterial overgrowth and pancreatic exocrine insufficieny. Consider carbohydrate breath test for SIBO & fecal fat or fecal elastase-1 for pancreatic exocrine insufficiency.     Previous test results from 2011 Celiac Disease Test Tests: (2) Gliadin Peptide Antibodies, IgG,IgA (41660)  ! Gliadin Peptide Ab, IgG  4.2 U/mL          (<20)     Reference Range:       <20   Negative       20-25  Equivocal       >25   Positive      Please note change in reference range(s).      ! Gliadin Peptide Ab, IgA               6.8 U/mL           (<20)     Reference Range:       <20   Negative       20-25  Equivocal       >25   Positive      Please note change in reference range(s).    Tests: (3) Tissue Transglutaminase Abs,IgG,IgA (41638)  ! tTG Ab, IgG        10.8 U/mL          (<20)     Reference Range:       <20   Negative       20-25  Equivocal       >25   Positive      Please note change in reference range(s).      tTG Ab, IgA        5.3 U/mL          (<20)     Reference Range:       <20   Negative       20-25  Equivocal       >25   Positive      Please note change in reference range(s).       Between 2-3% of Celiac patients have selective IgA deficiency. If the   tTG IgA result is negative but Celiac disease is still suspected,   total IgA should be measured to identify possible selective IgA   deficiency and to rule out a false negative. In cases of IgA   deficiency, measurement of tTG IgG should be considered.      Tests: (1) Giardia/Cryptosporidium (EIA) (45364)  Order Note: CHART: [id number]; ADDITIONAL REQUISITION(S) ATTACHED  ! GIARDIA NEGATIVE  ! CRYPTOSPORIDIUM NEGATIVE   (1) Order result status: Final  Collection or observation date-time: 05/23/2010 00:41  Requested date-time: 05/22/2010 15:49  Receipt date-time: 05/23/2010 00:41  Reported date-time: 05/23/2010 12:18  Referring Physician:  Ordering Physician: Niel Hummer [phone])  Specimen Source: stool  Source: Juliann Pares Order Number: [id number]  Lab site:  :Tests: (1) Culture, Stool ([id number])  ! FINAL REPORT  RESULT: No Salmonella,Shigella,Campylobacter or Yersinia

## 2020-03-09 NOTE — Assessment & Plan Note (Signed)
Denies feeling down and endorsed interest in activities. Her PHQ-9 score was 2, continue to monitor at office visits.

## 2020-03-10 NOTE — Progress Notes (Signed)
Internal Medicine Clinic Attending ° °Case discussed with Dr. Steen  at the time of the visit.  We reviewed the resident’s history and exam and pertinent patient test results.  I agree with the assessment, diagnosis, and plan of care documented in the resident’s note.  °

## 2020-03-20 MED FILL — GABAPENTIN 300 MG CAPSULE: 300 | 30 days supply | Qty: 90 | Fill #1

## 2020-03-20 MED FILL — VICTOZA 18 MG/3 ML INJECT P: 18 | 30 days supply | Qty: 9 | Fill #0

## 2020-04-10 ENCOUNTER — Encounter: Payer: Self-pay | Admitting: Internal Medicine

## 2020-04-10 ENCOUNTER — Ambulatory Visit: Payer: Self-pay | Admitting: Internal Medicine

## 2020-04-10 ENCOUNTER — Other Ambulatory Visit: Payer: Self-pay

## 2020-04-10 VITALS — BP 152/70 | HR 92 | Temp 99.0°F | Ht 66.0 in | Wt 218.2 lb

## 2020-04-10 DIAGNOSIS — Z87891 Personal history of nicotine dependence: Secondary | ICD-10-CM

## 2020-04-10 DIAGNOSIS — L97511 Non-pressure chronic ulcer of other part of right foot limited to breakdown of skin: Secondary | ICD-10-CM

## 2020-04-10 DIAGNOSIS — I10 Essential (primary) hypertension: Secondary | ICD-10-CM

## 2020-04-10 DIAGNOSIS — E11621 Type 2 diabetes mellitus with foot ulcer: Secondary | ICD-10-CM

## 2020-04-10 DIAGNOSIS — Z794 Long term (current) use of insulin: Secondary | ICD-10-CM

## 2020-04-10 DIAGNOSIS — E1142 Type 2 diabetes mellitus with diabetic polyneuropathy: Secondary | ICD-10-CM

## 2020-04-10 DIAGNOSIS — E7800 Pure hypercholesterolemia, unspecified: Secondary | ICD-10-CM

## 2020-04-10 DIAGNOSIS — Z79899 Other long term (current) drug therapy: Secondary | ICD-10-CM

## 2020-04-10 LAB — POCT GLYCOSYLATED HEMOGLOBIN (HGB A1C): Hemoglobin A1C: 8.3 % — AB (ref 4.0–5.6)

## 2020-04-10 LAB — GLUCOSE, CAPILLARY: Glucose-Capillary: 193 mg/dL — ABNORMAL HIGH (ref 70–99)

## 2020-04-10 MED ORDER — LISINOPRIL 10 MG PO TABS: 10.0000 mg | ORAL_TABLET | Freq: Every day | ORAL | 2 refills | Status: DC

## 2020-04-10 MED ORDER — INSULIN ISOPHANE & REGULAR (HUMAN 70-30)100 UNIT/ML KWIKPEN: PEN_INJECTOR | SUBCUTANEOUS | 2 refills | Status: DC

## 2020-04-10 MED ORDER — ATORVASTATIN CALCIUM 40 MG PO TABS: 40.0000 mg | ORAL_TABLET | Freq: Every day | ORAL | 2 refills | Status: DC

## 2020-04-10 MED ORDER — INSULIN LISPRO PROT & LISPRO (75-25 MIX) 100 UNIT/ML KWIKPEN: PEN_INJECTOR | SUBCUTANEOUS | 1 refills | Status: DC

## 2020-04-10 MED ORDER — METFORMIN HCL ER 500 MG PO TB24: 500.0000 mg | ORAL_TABLET | Freq: Every day | ORAL | 0 refills | Status: DC

## 2020-04-10 MED FILL — HUMALOG MIX 75-25 KWIKPEN: (75-25) 100 | 18 days supply | Qty: 15 | Fill #0

## 2020-04-10 MED FILL — LISINOPRIL 10 MG TABS: 10 | 30 days supply | Qty: 30 | Fill #0

## 2020-04-10 MED FILL — METFORMIN HCL ER 500 MG TB2: 500 | 30 days supply | Qty: 30 | Fill #0

## 2020-04-10 MED FILL — ATORVASTATIN 40 MG TABLET: 40 | 30 days supply | Qty: 30 | Fill #0

## 2020-04-10 NOTE — Patient Instructions (Addendum)
Ms. Labree, We will start back your lisinopril for blood pressure.  We will also start back metformin at a low dose, if this worsens your diarrhea please stop this medication.  Otherwise, continue your victoza and 70/30 insulin.  It will be important for you to keep a close eye on your feet if you notice worsening of your sore on your foot please come back in to be evaluated.

## 2020-04-10 NOTE — Progress Notes (Signed)
CC: T2DM, HTN, skin breakdown of right fourth toe  HPI:  [name] is a 54 y.o. female with PMH below.  Today we will address T2DM, HTN, skin breakdown of right fourth toe   Please see A&P for status of the patient's chronic medical conditions  Past Medical History:  Diagnosis Date  . Anal fissure   . Breast mass   . Bronchitis   . Carpal tunnel syndrome, bilateral   . Cholelithiasis   . Depression   . Diabetes mellitus    Type II  . Essential hypertension   . GERD (gastroesophageal reflux disease)   . Herpes simplex type 1 infection    vaginal  . Hypokalemia    2/2 diarrhea  . Menorrhagia   . Obesity   . Polycystic ovarian disease   . Renal calculi   . Renal calculus   . Sleep apnea    no cpap  . Sleep apnea, obstructive    Review of Systems: ROS: Pulmonary: pt denies increased work of breathing, shortness of breath,  Cardiac: pt denies palpitations, chest pain,  Abdominal: pt denies abdominal pain, nausea, vomiting, or diarrhea   Physical Exam:  Vitals:   04/10/20 1448  BP: (!) 152/70  Pulse: 92  Temp: 99 F (37.2 C)  TempSrc: Oral  SpO2: 98%  Weight: 218 lb 3.2 oz (99 kg)  Height: 5\' 6"  (1.676 m)   Cardiac: JVD flat, normal rate and rhythm, clear s1 and s2, no murmurs, rubs or gallops, no LE edema Pulmonary: CTAB, not in distress Abdominal: non distended abdomen, soft and nontender Psych: Alert, conversant, in good spirits MSK: early signs of skin breakdown from the fifth toe rubbing against the fourth.  No signs of infection.     Social History   Socioeconomic History  . Marital status: Married    Spouse name: Not on file  . Number of children: Not on file  . Years of education: Not on file  . Highest education level: Not on file  Occupational History  . Occupation: Deli    Employer: [employer]  Tobacco Use  . Smoking status: Former Smoker    Types: Cigarettes    Quit date: 12/30/1978    Years since quitting: 41.3  .  Smokeless tobacco: Never Used  Substance and Sexual Activity  . Alcohol use: No    Alcohol/week: 0.0 standard drinks  . Drug use: No  . Sexual activity: Never  Other Topics Concern  . Not on file  Social History Narrative    Smoked many years ago drinks 1-2 beers on Friday and Saturdays no illegal drug use. Married and lives with her husband and sister-in-law son daughter and grandson.   Financial assistance approved for 100% discount at [facility] and has [employer] card per [facility] as of August 06, 2010 6:27 PM   Social Determinants of Health   Financial Resource Strain:   . Difficulty of Paying Living Expenses:   Food Insecurity:   . Worried About August 08, 2010 in the Last Year:   . Programme researcher, broadcasting/film/video in the Last Year:   Transportation Needs:   . Barista (Medical):   Freight forwarder Lack of Transportation (Non-Medical):   Physical Activity:   . Days of Exercise per Week:   . Minutes of Exercise per Session:   Stress:   . Feeling of Stress :   Social Connections:   . Frequency of Communication with Friends and Family:   .  Frequency of Social Gatherings with Friends and Family:   . Attends Religious Services:   . Active Member of Clubs or Organizations:   . Attends Archivist Meetings:   Marland Kitchen Marital Status:   Intimate Partner Violence:   . Fear of Current or Ex-Partner:   . Emotionally Abused:   Marland Kitchen Physically Abused:   . Sexually Abused:     Family History  Problem Relation Age of Onset  . Hypertension Mother   . Diabetes Father   . Colon cancer Neg Hx   . Esophageal cancer Neg Hx   . Stomach cancer Neg Hx   . Rectal cancer Neg Hx     Assessment & Plan:   See Encounters Tab for problem based charting.  Patient discussed with Dr. Rebeca Alert

## 2020-04-11 DIAGNOSIS — L97511 Non-pressure chronic ulcer of other part of right foot limited to breakdown of skin: Secondary | ICD-10-CM | POA: Insufficient documentation

## 2020-04-11 NOTE — Assessment & Plan Note (Signed)
T2DM: Victoza 1.8, takes 70/30 40u in the am and 45 in the pm.  Did not bring meter today.  A1C stable and improved at 8.3.  Encouraged patient that she is doing well.  At this point we both believe that her metformin had little to do with her diarrhea.  -start back metformin at low dose with meals -continue victoza, insulin 70/30

## 2020-04-11 NOTE — Progress Notes (Signed)
Internal Medicine Clinic Attending  Case discussed with Dr. Winfrey at the time of the visit.  We reviewed the resident's history and exam and pertinent patient test results.  I agree with the assessment, diagnosis, and plan of care documented in the resident's note.  Shawntia Mangal, M.D., Ph.D.  

## 2020-04-11 NOTE — Assessment & Plan Note (Signed)
Patient reports switching shoes to a regular from a wide and they are very tight fitting.  She has poor sensation due to neuropathy.  On exam there are early signs of skin breakdown from the fifth toe rubbing against the fourth.  No signs of infection.    -counseled patient on importance of getting properly fitting shoes, she will switch immediately -ideally we would have her go to podiatry but currently she has no insurance -given strict return precautions and will check her feet daily

## 2020-04-11 NOTE — Assessment & Plan Note (Signed)
HTN: Last visit for diarrhea lisinopril discontinued by mistake  -restart lisinopril

## 2020-04-11 NOTE — Assessment & Plan Note (Signed)
Pt requires refills on medications with associated diagnosis above.  Reviewed disease process and find this medication to be necessary, will not change dose or alter current therapy. 

## 2020-04-13 ENCOUNTER — Encounter: Payer: Self-pay | Admitting: Internal Medicine

## 2020-04-20 ENCOUNTER — Encounter: Payer: Self-pay | Admitting: *Deleted

## 2020-05-15 ENCOUNTER — Telehealth: Payer: Self-pay | Admitting: *Deleted

## 2020-05-15 ENCOUNTER — Encounter: Payer: Self-pay | Admitting: Internal Medicine

## 2020-05-15 ENCOUNTER — Ambulatory Visit: Payer: Self-pay | Admitting: Internal Medicine

## 2020-05-15 VITALS — BP 145/75 | HR 76 | Temp 98.2°F | Wt 219.7 lb

## 2020-05-15 DIAGNOSIS — N6312 Unspecified lump in the right breast, upper inner quadrant: Secondary | ICD-10-CM

## 2020-05-15 NOTE — Telephone Encounter (Signed)
Pt here c/o lump right breast x 41mths-CMA made call to [facility] ([facility] to assist pt with getting an appt for diagnostic mammo/ultrasound-No answer, message left on recorder for return call.  Pt also given info to [facility]. Pt to call CMA if she doesn't hear anything by the end of the week.Amy Spittle Cassady5/17/20212:58 PM

## 2020-05-15 NOTE — Patient Instructions (Addendum)
Ms. Couse, the GI breast center will call you tomorrow to make an appointment with them.  Please be sure to schedule an office appointment in 2 months when you are do for an A1C.

## 2020-05-15 NOTE — Progress Notes (Signed)
CC: lump of right breast   HPI:  [name] is a 54 y.o. female with PMH below.  Today we will address  lump of right breast   Please see A&P for status of the patient's chronic medical conditions  Past Medical History:  Diagnosis Date  . Anal fissure   . Breast mass   . Bronchitis   . Carpal tunnel syndrome, bilateral   . Cholelithiasis   . Depression   . Diabetes mellitus    Type II  . Essential hypertension   . GERD (gastroesophageal reflux disease)   . Herpes simplex type 1 infection    vaginal  . Hypokalemia    2/2 diarrhea  . Menorrhagia   . Obesity   . Polycystic ovarian disease   . Renal calculi   . Renal calculus   . Sleep apnea    no cpap  . Sleep apnea, obstructive    Review of Systems:  ROS: Pulmonary: pt denies increased work of breathing, shortness of breath,  Cardiac: pt denies palpitations, chest pain,  Abdominal: pt denies abdominal pain, nausea, vomiting, or diarrhea   Physical Exam:  Vitals:   05/15/20 1445  BP: (!) 145/75  Pulse: 76  Temp: 98.2 F (36.8 C)  TempSrc: Oral  SpO2: 98%  Weight: 219 lb 11.2 oz (99.7 kg)   Cardiac:  normal rate and rhythm,no murmurs, rubs or gallops Pulmonary: CTAB, not in distress Breast exam: no lymphadenopathy up to axilla, no erythema or induration, there is a small periareolar well circumscribed, 1.5cm mobile lump at the one o'clock position.  It was not painful to palpation.     Social History   Socioeconomic History  . Marital status: Married    Spouse name: Not on file  . Number of children: Not on file  . Years of education: Not on file  . Highest education level: Not on file  Occupational History  . Occupation: Deli    Employer: [employer]  Tobacco Use  . Smoking status: Former Smoker    Types: Cigarettes    Quit date: 12/30/1978    Years since quitting: 41.4  . Smokeless tobacco: Never Used  Substance and Sexual Activity  . Alcohol use: No    Alcohol/week: 0.0 standard  drinks  . Drug use: No  . Sexual activity: Never  Other Topics Concern  . Not on file  Social History Narrative    Smoked many years ago drinks 1-2 beers on Friday and Saturdays no illegal drug use. Married and lives with her husband and sister-in-law son daughter and grandson.   Financial assistance approved for 100% discount at [facility] and has [facility] card per [facility]onna Gains as of August 06, 2010 6:27 PM   Social Determinants of Health   Financial Resource Strain:   . Difficulty of Paying Living Expenses:   Food Insecurity:   . Worried About Charity fundraiser in the Last Year:   . Arboriculturist in the Last Year:   Transportation Needs:   . Film/video editor (Medical):   Marland Kitchen Lack of Transportation (Non-Medical):   Physical Activity:   . Days of Exercise per Week:   . Minutes of Exercise per Session:   Stress:   . Feeling of Stress :   Social Connections:   . Frequency of Communication with Friends and Family:   . Frequency of Social Gatherings with Friends and Family:   . Attends Religious Services:   . Active Member  of Clubs or Organizations:   . Attends Banker Meetings:   Marland Kitchen Marital Status:   Intimate Partner Violence:   . Fear of Current or Ex-Partner:   . Emotionally Abused:   Marland Kitchen Physically Abused:   . Sexually Abused:     Family History  Problem Relation Age of Onset  . Hypertension Mother   . Diabetes Father   . Colon cancer Neg Hx   . Esophageal cancer Neg Hx   . Stomach cancer Neg Hx   . Rectal cancer Neg Hx     Assessment & Plan:   See Encounters Tab for problem based charting.  Patient discussed with Dr. Oswaldo Done

## 2020-05-16 ENCOUNTER — Other Ambulatory Visit: Payer: Self-pay | Admitting: Internal Medicine

## 2020-05-16 DIAGNOSIS — N6312 Unspecified lump in the right breast, upper inner quadrant: Secondary | ICD-10-CM | POA: Insufficient documentation

## 2020-05-16 DIAGNOSIS — E1142 Type 2 diabetes mellitus with diabetic polyneuropathy: Secondary | ICD-10-CM

## 2020-05-16 MED FILL — HUMALOG MIX 75-25 KWIKPEN: (75-25) 100 | 18 days supply | Qty: 15 | Fill #0

## 2020-05-16 NOTE — Assessment & Plan Note (Signed)
She states the lump appeared 6 months ago but was told by her friends it was likely a lymph node and would go away and not to worry about it.  She doesn't think it has changed in size.  She is slightly overdue for screening mammography.  Her mother and Aunt on her mother's side were diagnosed with breast cancer in their late 67's.  She is not aware of genetic testing or presence of BRCA gene.    -refer to breast center for bilateral diagnostic mammography/ R breast US

## 2020-05-16 NOTE — Progress Notes (Signed)
Internal Medicine Clinic Attending  Case discussed with Dr. Winfrey  at the time of the visit.  We reviewed the resident's history and exam and pertinent patient test results.  I agree with the assessment, diagnosis, and plan of care documented in the resident's note.  

## 2020-05-17 ENCOUNTER — Other Ambulatory Visit: Payer: Self-pay

## 2020-05-17 DIAGNOSIS — N6310 Unspecified lump in the right breast, unspecified quadrant: Secondary | ICD-10-CM

## 2020-05-17 MED FILL — GABAPENTIN 300 MG CAPSULE: 300 | 30 days supply | Qty: 90 | Fill #0

## 2020-05-24 MED FILL — VICTOZA 18 MG/3 ML INJECT P: 18 | 30 days supply | Qty: 9 | Fill #1

## 2020-06-01 ENCOUNTER — Encounter: Payer: Self-pay | Admitting: Dietician

## 2020-06-01 ENCOUNTER — Other Ambulatory Visit: Payer: Self-pay | Admitting: Internal Medicine

## 2020-06-01 ENCOUNTER — Telehealth: Payer: Self-pay | Admitting: *Deleted

## 2020-06-01 ENCOUNTER — Ambulatory Visit: Payer: Self-pay

## 2020-06-01 ENCOUNTER — Telehealth: Payer: Self-pay | Admitting: Dietician

## 2020-06-01 ENCOUNTER — Other Ambulatory Visit: Payer: Self-pay

## 2020-06-01 ENCOUNTER — Ambulatory Visit (INDEPENDENT_AMBULATORY_CARE_PROVIDER_SITE_OTHER): Payer: Self-pay | Admitting: Internal Medicine

## 2020-06-01 ENCOUNTER — Ambulatory Visit: Payer: Self-pay | Admitting: Internal Medicine

## 2020-06-01 ENCOUNTER — Ambulatory Visit: Payer: Self-pay | Admitting: Dietician

## 2020-06-01 VITALS — BP 131/75 | HR 92 | Temp 98.6°F | Ht 66.0 in | Wt 211.3 lb

## 2020-06-01 DIAGNOSIS — E1142 Type 2 diabetes mellitus with diabetic polyneuropathy: Secondary | ICD-10-CM

## 2020-06-01 DIAGNOSIS — R1111 Vomiting without nausea: Secondary | ICD-10-CM

## 2020-06-01 DIAGNOSIS — R1110 Vomiting, unspecified: Secondary | ICD-10-CM | POA: Insufficient documentation

## 2020-06-01 DIAGNOSIS — N3090 Cystitis, unspecified without hematuria: Secondary | ICD-10-CM

## 2020-06-01 LAB — BASIC METABOLIC PANEL
Anion gap: 13 (ref 5–15)
BUN: 9 mg/dL (ref 6–20)
CO2: 29 mmol/L (ref 22–32)
Calcium: 10.1 mg/dL (ref 8.9–10.3)
Chloride: 99 mmol/L (ref 98–111)
Creatinine, Ser: 0.7 mg/dL (ref 0.44–1.00)
GFR calc Af Amer: >60 mL/min (ref >60)
GFR calc non Af Amer: >60 mL/min (ref >60)
Glucose, Bld: 208 mg/dL — ABNORMAL HIGH (ref 70–99)
Potassium: 4.2 mmol/L (ref 3.5–5.1)
Sodium: 141 mmol/L (ref 135–145)

## 2020-06-01 LAB — POCT URINALYSIS DIPSTICK
Blood, UA: NEGATIVE
Glucose, UA: POSITIVE — AB
Nitrite, UA: NEGATIVE
Protein, UA: POSITIVE — AB
Spec Grav, UA: 1.02 (ref 1.010–1.025)
Urobilinogen, UA: 1 E.U./dL
pH, UA: 8.5 — AB (ref 5.0–8.0)

## 2020-06-01 LAB — BETA-HYDROXYBUTYRIC ACID: Beta-Hydroxybutyric Acid: 0.15 mmol/L (ref 0.05–0.27)

## 2020-06-01 LAB — GLUCOSE, CAPILLARY: Glucose-Capillary: 197 mg/dL — ABNORMAL HIGH (ref 70–99)

## 2020-06-01 MED ORDER — MICROLET LANCETS MISC: 12 refills | Status: AC

## 2020-06-01 MED ORDER — ONDANSETRON HCL 4 MG PO TABS: 4.0000 mg | ORAL_TABLET | Freq: Every day | ORAL | 0 refills | Status: DC | PRN

## 2020-06-01 MED ORDER — GLUCOSE BLOOD VI STRP
ORAL_STRIP | 12 refills | Status: DC
Start: 2020-06-01 — End: 2020-06-01

## 2020-06-01 MED FILL — ONDANSETRON HCL 4 MG TABLET: 4 | 30 days supply | Qty: 30 | Fill #0

## 2020-06-01 NOTE — Assessment & Plan Note (Signed)
(  Tele visit) Reports nonbloody emesis since early this morning, also associated with weakness for past week. No new medication or new diet in past week. Currently not nauseated but feels weak. She has not checked her blood sugar for past 2 weeks.  Although her symptoms can be due to viral disease given she also reports some runny nose yesterday, given her history of diabetes and not checking her blood sugar for past 2 weeks, we should rule out DKA.  Instructed to come to clinic for BMP and blood sugar check. (Appointment scheduled for this p.m.)

## 2020-06-01 NOTE — Patient Instructions (Signed)
Hi Kiri,   As I mentioned at today;s visit, please feel free to contact me either by phone or MyChart anytime you have questions or concerns.   [name] [phone]

## 2020-06-01 NOTE — Telephone Encounter (Signed)
Testing supplies

## 2020-06-01 NOTE — Progress Notes (Signed)
Saw Ms. Kiesel today to give sample of Contour blood glucose meter and educate her on how to use the meter. Pt demonstrated proper use of the meter and downloaded the Contour phone app so that she can upload her blood glucose readings to her smart phone. Pt checked her blood glucose while in the office and it was 188mg /dL at around 4pm today.    Diabetes Self-Management Education - 06/01/20 1600      Psychosocial Assessment   Special Needs  Other (comment)  (Pended)    financial   Preferred Learning Style  Auditory;Visual;Hands on  (Pended)       Pre-Education Assessment   Patient understands monitoring blood glucose, interpreting and using results  Needs Review  (Pended)    Asked to give pt sample of blood glucose meter     Complications   How often do you check your blood sugar?  1-2 times/day  (Pended)       Patient Education   Monitoring  Taught/evaluated SMBG meter.  (Pended)       Individualized Goals (developed by patient)   Monitoring   test my blood glucose as discussed;Other (comment)  (Pended)    check BG 2X per day, use iphone app w/ meter     Post-Education Assessment   Patient understands monitoring blood glucose, interpreting and using results  Demonstrates understanding / competency  (Pended)       Outcomes   Program Status  Completed  (Pended)       08/01/20, Student-Dietician 06/01/2020 5:04 PM

## 2020-06-01 NOTE — Progress Notes (Signed)
   CC: Nausea, vomiting, abdominal pain  HPI:  Amy Norris is a 54 y.o. with history of postural dizziness and presyncope, hypertension, uncontrolled diabetes, and hyperlipidemia presented with nausea, vomiting, and abdominal pain.  Also endorsing polyuria, polydipsia, and fatigue.   Patient had a telehealth appointment earlier today where she noted nonbloody emesis, nausea, fatigue, and generally not feeling well.  She has not been checking her blood sugars at home but when she is a check months ago it was around 182-260.  She was advised to come in for stat labs to evaluate for DKA.  Today patient reports that she has been feeling generally unwell for the past 2 to 3 weeks, with dizziness, lightheadedness nausea, and occasional chills.  She reports that this morning she got up and started feeling very unwell, started having emesis that was nonbloody.  She denied any chest pain, abdominal pain, fevers, headaches, or other symptoms.  Denied any recent travel, denied any recent sick contacts, no recent changes in her diet.  She is currently on Victoza, and Metformin for her diabetes and does not check her blood sugars at home, she last checked 8 months ago and it was around 1 82-60 at that time.  She reports that she is "too lazy to check".  She also misplaced her meter.   Past Medical History:  Diagnosis Date  . Anal fissure   . Breast mass   . Bronchitis   . Carpal tunnel syndrome, bilateral   . Cholelithiasis   . Depression   . Diabetes mellitus    Type II  . Essential hypertension   . GERD (gastroesophageal reflux disease)   . Herpes simplex type 1 infection    vaginal  . Hypokalemia    2/2 diarrhea  . Menorrhagia   . Obesity   . Polycystic ovarian disease   . Renal calculi   . Renal calculus   . Sleep apnea    no cpap  . Sleep apnea, obstructive    Review of Systems:  Reports decreased energy, nausea, dizziness, lightheadedness, emesis, and feeling generally unwell.   Denies fevers, chest pain, abdominal pain shortness of breath, or other symptoms.  Physical Exam:  Vitals:   06/01/20 1331  BP: 131/75  Pulse: 92  Temp: 98.6 F (37 C)  TempSrc: Oral  SpO2: 96%  Weight: 211 lb 4.8 oz (95.8 kg)  Height: 5\' 6"  (1.676 m)   Physical Exam  Constitutional: She is oriented to person, place, and time and well-developed, well-nourished, and in no distress.  HENT:  Head: Normocephalic and atraumatic.  Cardiovascular: Normal rate, regular rhythm and normal heart sounds.  Pulmonary/Chest: Effort normal and breath sounds normal. No respiratory distress.  Abdominal: Soft. Bowel sounds are normal. She exhibits no distension. There is no abdominal tenderness.  Musculoskeletal:        General: No edema. Normal range of motion.  Neurological: She is alert and oriented to person, place, and time.  Skin: Skin is warm and dry. No erythema.  Psychiatric: Mood and affect normal.     Assessment & Plan:   See Encounters Tab for problem based charting.  Patient discussed with Dr. 

## 2020-06-01 NOTE — Progress Notes (Signed)
Internal Medicine Clinic Attending  Case discussed with Dr. Masoudi  at the time of the visit.  We reviewed the resident's history and exam and pertinent patient test results.  I agree with the assessment, diagnosis, and plan of care documented in the resident's note.  

## 2020-06-01 NOTE — Progress Notes (Signed)
I reviewed and approve this note and the plan.will request testing supplies Norm Parcel, RD 06/01/2020 5:18 PM.

## 2020-06-01 NOTE — Patient Instructions (Addendum)
Ms. NALEE LIGHTLE,  It was a pleasure to see you today. Thank you for coming in.   Today we discussed your nausea, vomiting, and feeling generally unwell. We are checking some labs today and they look okay. You urine studies were a little abnormal, we are checking them again. Please continue taking your insulin and start checking your blood sugars daily. Please bring in your meter on your next visit.   If your symptoms do not improve or worsen please contact us to let us know.   Please return to clinic in 1 month or sooner if needed.   Thank you again for coming in.   Claudean Severance.D.

## 2020-06-01 NOTE — Telephone Encounter (Signed)
Called pt per dr Maryla Morrow, she has found her meter but probably needs batteries and she cannot find any strips. She thinks it is some type of relion meter.she will bring it with her to appt this pm at [address] [facility]

## 2020-06-01 NOTE — Progress Notes (Signed)
  [facility] Internal Medicine Residency Telephone Encounter Continuity Care Appointment  HPI:   This telephone encounter was created for [name] on 06/01/2020 for the following purpose/cc vomiting since 4 AM today. 4 times mucus and food non bloody emesis. No fever but she feels cold. No abdominal pain. No diarrhea. No one sick around her. She felt drained last week. She mentions that she was on stress. No cough. She had mild runny nose yesterday. She had dinner with family at [facility]. No one else who had same food had this symptoms. She received COVID vaccine 5/17.  Her last insulin dose was last night.  She is not checked her blood sugar for past 2 weeks.   Past Medical History:  Past Medical History:  Diagnosis Date  . Anal fissure   . Breast mass   . Bronchitis   . Carpal tunnel syndrome, bilateral   . Cholelithiasis   . Depression   . Diabetes mellitus    Type II  . Essential hypertension   . GERD (gastroesophageal reflux disease)   . Herpes simplex type 1 infection    vaginal  . Hypokalemia    2/2 diarrhea  . Menorrhagia   . Obesity   . Polycystic ovarian disease   . Renal calculi   . Renal calculus   . Sleep apnea    no cpap  . Sleep apnea, obstructive       ROS:   Positive for vomiting, low energy and weakness.   Assessment / Plan / Recommendations:   Please see A&P under problem oriented charting for assessment of the patient's acute and chronic medical conditions.   As always, pt is advised that if symptoms worsen or new symptoms arise, they should go to an urgent care facility or to to ER for further evaluation.   Consent and Medical Decision Making:   Patient discussed with Dr. Antony Contras  This is a telephone encounter between [facility] and Amy Norris on 06/01/2020 for complaint of vomiting. The visit was conducted with the patient located at home and [facility] at [facility]. The patient's identity was confirmed using their  DOB and current address. The patient has consented to being evaluated through a telephone encounter and understands the associated risks (an examination cannot be done and the patient may need to come in for an appointment) / benefits (allows the patient to remain at home, decreasing exposure to coronavirus). I personally spent 15 minutes on medical discussion.

## 2020-06-02 LAB — URINALYSIS, COMPLETE
Bilirubin, UA: NEGATIVE
Nitrite, UA: NEGATIVE
RBC, UA: NEGATIVE
Specific Gravity, UA: 1.027 (ref 1.005–1.030)
Urobilinogen, Ur: 1 mg/dL (ref 0.2–1.0)
pH, UA: 7.5 (ref 5.0–7.5)

## 2020-06-02 LAB — MICROSCOPIC EXAMINATION
Casts: NONE SEEN /lpf
Epithelial Cells (non renal): >10 /hpf — AB (ref 0–10)

## 2020-06-02 MED FILL — MICROLET LANCETS MISC: 25 days supply | Qty: 100 | Fill #0

## 2020-06-02 MED FILL — CONTOUR NEXT STRIPS: 25 days supply | Qty: 100 | Fill #0

## 2020-06-02 NOTE — Assessment & Plan Note (Addendum)
Patient had a telehealth appointment earlier today where she noted nonbloody emesis, nausea, fatigue, and generally not feeling well.  She has not been checking her blood sugars at home but when she is a check months ago it was around 182-260.  She was advised to come in for stat labs to evaluate for DKA.  Today patient reports that she has been feeling generally unwell for the past 2 to 3 weeks, with dizziness, lightheadedness nausea, and occasional chills.  She reports that this morning she got up and started feeling very unwell, started having emesis that was nonbloody.  She denied any chest pain, abdominal pain, fevers, headaches, or other symptoms.  Denied any recent travel, denied any recent sick contacts, no recent changes in her diet.  She is currently on Victoza, and Metformin for her diabetes and does not check her blood sugars at home, she last checked 8 months ago and it was around 1 82-60 at that time.  She reports that she is "too lazy to check".  She also misplaced her meter.  On exam patient appears well, she is in no acute distress, cardiac, pulmonary, and abdominal exam are unremarkable.  Vital signs were stable.  Stat labs were obtained, a BMP and beta hydroxybutyrate was not consistent with DKA.  Urinalysis showed trace ketones, leukocytes, and glucosuria. Orthostatics were checked and were negative.  Overall this is not consistent with DKA, could be a viral gastroenteritis.  Her urinalysis showed some findings that could be consistent with a UTI, however she was not having any dysuria so we will wait for a formal urinalysis and urine culture prior to starting any antibiotics.  Patient was given antinausea medications and advised to get plenty of rest, stay hydrated, and eat healthy.  Advised to start checking her blood sugars regularly, patient was given a meter and test strips.  -Zofran as needed -Follow-up urinalysis and urine culture -Return to clinic in 1 month to follow-up  symptoms and diabetes

## 2020-06-05 LAB — URINE CULTURE

## 2020-06-05 MED ORDER — NITROFURANTOIN MONOHYD MACRO 100 MG PO CAPS
100.0000 mg | ORAL_CAPSULE | Freq: Two times a day (BID) | ORAL | 0 refills | Status: AC
Start: 2020-06-05 — End: 2020-06-10

## 2020-06-05 NOTE — Progress Notes (Signed)
Internal Medicine Clinic Attending  Case discussed with Dr. Gwyneth Revels at the time of the visit.  We reviewed the resident's history and exam and pertinent patient test results.  I agree with the assessment, diagnosis, and plan of care documented in the resident's note with the following correction:  Patient is also prescribed Humalog 75/25 40 units qAM and 45 units qPM in addition to metformin and victoza. Labs not consistent with DKA. Agree with supportive care for symptoms. Patient was provided a meter today with education on how to check her CBGs.   Urine culture growing E coli. Recommend follow up of symptoms and treating accordingly.

## 2020-06-05 NOTE — Addendum Note (Signed)
Addended by: Claudean Severance on: 06/05/2020 01:46 PM   Modules accepted: Orders

## 2020-06-05 NOTE — Assessment & Plan Note (Signed)
Contacted patient regarding urine culture results showing E coli. She reports some dizziness and light headedness. Denies any dysuria. However does report that there is an odor and reports urinating a lot when she urinates, and having to strain when she urinates. Also reports mild abdominal pain. Discussed that since she is having some symptoms that we can do a short course of antibiotics to cover for an infection. She is in agreement with this plan.   -Macrobid 100 mg BID x 5 days

## 2020-06-14 MED FILL — GABAPENTIN 300 MG CAPSULE: 300 | 30 days supply | Qty: 90 | Fill #1

## 2020-06-19 ENCOUNTER — Other Ambulatory Visit: Payer: Self-pay | Admitting: Internal Medicine

## 2020-06-19 DIAGNOSIS — W19XXXD Unspecified fall, subsequent encounter: Secondary | ICD-10-CM

## 2020-06-21 ENCOUNTER — Other Ambulatory Visit: Payer: Self-pay | Admitting: Internal Medicine

## 2020-06-21 DIAGNOSIS — E1142 Type 2 diabetes mellitus with diabetic polyneuropathy: Secondary | ICD-10-CM

## 2020-06-21 MED ORDER — VICTOZA 18 MG/3ML ~~LOC~~ SOPN: 1.8000 mg | PEN_INJECTOR | Freq: Every day | SUBCUTANEOUS | 1 refills | Status: DC

## 2020-06-21 MED FILL — VICTOZA 18 MG/3 ML INJECT P: 18 | 28 days supply | Qty: 9 | Fill #0

## 2020-06-21 MED FILL — UNIFINE PENTIPS 6MM 31G: 31G X 6 MM | 90 days supply | Qty: 100 | Fill #0

## 2020-07-03 MED FILL — VICTOZA 18 MG/3 ML INJECT P: 18 | 30 days supply | Qty: 9 | Fill #0

## 2020-07-10 ENCOUNTER — Other Ambulatory Visit: Payer: Self-pay | Admitting: Internal Medicine

## 2020-07-10 DIAGNOSIS — I10 Essential (primary) hypertension: Secondary | ICD-10-CM

## 2020-07-10 DIAGNOSIS — E7800 Pure hypercholesterolemia, unspecified: Secondary | ICD-10-CM

## 2020-07-10 MED FILL — LISINOPRIL 10 MG TABS: 10 | 30 days supply | Qty: 30 | Fill #0

## 2020-07-10 MED FILL — CONTOUR NEXT STRIPS: 25 days supply | Qty: 100 | Fill #1

## 2020-07-10 MED FILL — ATORVASTATIN 40 MG TABLET: 40 | 30 days supply | Qty: 30 | Fill #0

## 2020-07-13 ENCOUNTER — Ambulatory Visit: Payer: Self-pay | Admitting: Internal Medicine

## 2020-07-13 ENCOUNTER — Encounter: Payer: Self-pay | Admitting: Internal Medicine

## 2020-07-13 ENCOUNTER — Other Ambulatory Visit: Payer: Self-pay

## 2020-07-13 DIAGNOSIS — M65332 Trigger finger, left middle finger: Secondary | ICD-10-CM

## 2020-07-13 NOTE — Progress Notes (Signed)
CC: Finger pain  HPI:  [name] is a 54 y.o. female with a past medical history stated below and presents today for finger pain.  Please see problem based assessment and plan for additional details.  Past Medical History:  Diagnosis Date  . Anal fissure   . Breast mass   . Bronchitis   . Carpal tunnel syndrome, bilateral   . Cholelithiasis   . Depression   . Diabetes mellitus    Type II  . Essential hypertension   . GERD (gastroesophageal reflux disease)   . Herpes simplex type 1 infection    vaginal  . Hypokalemia    2/2 diarrhea  . Menorrhagia   . Obesity   . Polycystic ovarian disease   . Renal calculi   . Renal calculus   . Sleep apnea    no cpap  . Sleep apnea, obstructive     Current Outpatient Medications on File Prior to Visit  Medication Sig Dispense Refill  . atorvastatin (LIPITOR) 40 MG tablet TAKE 1 TABLET (40 MG TOTAL) BY MOUTH DAILY. 90 tablet 1  . benzonatate (TESSALON) 100 MG capsule Take 1 capsule by mouth three times daily as needed for cough 30 capsule 0  . colestipol (COLESTID) 1 g tablet Take 2 tablets (2 g total) by mouth 2 (two) times daily. 120 tablet 2  . gabapentin (NEURONTIN) 300 MG capsule TAKE 1 CAPSULE (300 MG TOTAL) BY MOUTH 3 (THREE) TIMES DAILY. 90 capsule 1  . glucose blood test strip Use to test blood glucose up to 4 times per day. 200 each 12  . Insulin Lispro Prot & Lispro (HUMALOG 75/25 MIX) (75-25) 100 UNIT/ML Kwikpen Take 40 units with breakfast and 45 units with dinner daily 15 mL 1  . Insulin Pen Needle 32G X 6 MM MISC Please use to inject insulin five times daily. ICD 10: E11.65 490 each 11  . liraglutide (VICTOZA) 18 MG/3ML SOPN Inject 0.3 mLs (1.8 mg total) into the skin daily. 9 mL 1  . lisinopril (ZESTRIL) 10 MG tablet TAKE 1 TABLET (10 MG TOTAL) BY MOUTH DAILY. 90 tablet 1  . metFORMIN (GLUCOPHAGE-XR) 500 MG 24 hr tablet Take 1 tablet (500 mg total) by mouth daily with breakfast. 30 tablet 0  . Microlet Lancets  MISC Use to check blood glucose up to 4 times per day. 200 each 12  . ondansetron (ZOFRAN) 4 MG tablet Take 1 tablet (4 mg total) by mouth daily as needed for nausea or vomiting. 30 tablet 0  . UNIFINE PENTIPS 31G X 6 MM MISC USE AS DIRECTED WITH VICTOZA 100 each 0   No current facility-administered medications on file prior to visit.    Family History  Problem Relation Age of Onset  . Hypertension Mother   . Diabetes Father   . Colon cancer Neg Hx   . Esophageal cancer Neg Hx   . Stomach cancer Neg Hx   . Rectal cancer Neg Hx     Social History   Socioeconomic History  . Marital status: Married    Spouse name: Not on file  . Number of children: Not on file  . Years of education: Not on file  . Highest education level: Not on file  Occupational History  . Occupation: Deli    Employer: [employer]  Tobacco Use  . Smoking status: Former Smoker    Types: Cigarettes    Quit date: 12/30/1978    Years since quitting: 41.5  .  Smokeless tobacco: Never Used  Vaping Use  . Vaping Use: Never used  Substance and Sexual Activity  . Alcohol use: No    Alcohol/week: 0.0 standard drinks  . Drug use: No  . Sexual activity: Never  Other Topics Concern  . Not on file  Social History Narrative    Smoked many years ago drinks 1-2 beers on Friday and Saturdays no illegal drug use. Married and lives with her husband and sister-in-law son daughter and grandson.   Financial assistance approved for 100% discount at [facility] and has [facility] card per Rudell Cobb as of August 06, 2010 6:27 PM   Social Determinants of Health   Financial Resource Strain:   . Difficulty of Paying Living Expenses:   Food Insecurity:   . Worried About Programme researcher, broadcasting/film/video in the Last Year:   . Barista in the Last Year:   Transportation Needs:   . Freight forwarder (Medical):   Marland Kitchen Lack of Transportation (Non-Medical):   Physical Activity:   . Days of Exercise per Week:   . Minutes of Exercise per Session:    Stress:   . Feeling of Stress :   Social Connections:   . Frequency of Communication with Friends and Family:   . Frequency of Social Gatherings with Friends and Family:   . Attends Religious Services:   . Active Member of Clubs or Organizations:   . Attends Banker Meetings:   Marland Kitchen Marital Status:   Intimate Partner Violence:   . Fear of Current or Ex-Partner:   . Emotionally Abused:   Marland Kitchen Physically Abused:   . Sexually Abused:     Review of Systems: ROS negative except for what is noted on the assessment and plan.  Vitals:   07/13/20 1326  BP: 124/75  Pulse: 83  Temp: 97.9 F (36.6 C)  TempSrc: Oral  SpO2: 98%  Weight: 215 lb 9.6 oz (97.8 kg)     Physical Exam: Physical Exam Constitutional:      Appearance: Normal appearance.  HENT:     Head: Normocephalic and atraumatic.  Musculoskeletal:        General: No swelling, deformity or signs of injury.     Left hand: Normal pulse.     Comments: Left 3rd digit pain on palmar surface with flexion with clicking/cathcing with flexion. Active ROM limited in flexion, but full passive ROM. 5/5 strength in all digits of her hand.  Skin:    General: Skin is warm and dry.     Findings: No bruising, erythema or rash.  Neurological:     General: No focal deficit present.     Mental Status: She is alert. Mental status is at baseline.  Psychiatric:        Mood and Affect: Mood normal.        Behavior: Behavior normal.      Assessment & Plan:   See Encounters Tab for problem based charting.  Patient discussed with Dr. Clinton Gallant, D.O. [facility] Internal Medicine, PGY-2 Pager: [phone], Phone: [phone] Date 07/13/2020 Time 1:56 PM

## 2020-07-13 NOTE — Patient Instructions (Signed)
Thank you, [name] for allowing [place] to provide your care today. Today we discussed Trigger finger (stenosing flexor tenosynovitis).   I have ordered the following medication/changed the following medications:  1. DME Brace for finger  Please follow-up in 6 weeks.  Should you have any questions or concerns please call the internal medicine clinic at [phone].    Dellia Cloud, D.O. [place] [facility]   My Chart Access: [email or web address]   If you have not already done so, please get your COVID 19 vaccine  To schedule an appointment for a COVID vaccine choice any of the following: Go to [email or web address]   Go to [email or web address]                  Call [phone]                                     Call [phone] and select Option 2

## 2020-07-14 ENCOUNTER — Encounter: Payer: Self-pay | Admitting: Internal Medicine

## 2020-07-14 DIAGNOSIS — M4690 Unspecified inflammatory spondylopathy, site unspecified: Secondary | ICD-10-CM | POA: Insufficient documentation

## 2020-07-14 DIAGNOSIS — M152 Bouchard's nodes (with arthropathy): Secondary | ICD-10-CM | POA: Insufficient documentation

## 2020-07-14 MED ORDER — CURVED FINGER SPLINT MISC: 1.0000 | Freq: Every day | 0 refills | Status: DC

## 2020-07-14 NOTE — Assessment & Plan Note (Addendum)
Patient presents with a 1 month history of middle finger pain and stiffness. She denies precipitating factors such as trauma, over-use, infection or history of OA/gout. She states that the finger will intermittently become stiff and will have difficult with flexion. She does admit to some pain on the palmar surface around the MCP particularly with movement. She notices a clicking noise when she flexes the finger and it feel like the finger is catching on something.  On exam, she has some TTP at the MCP on the palmar surface, but no skin changes, erythema, effusions, or bony/soft tissue deformities. She has slightly reduced active ROM with flexion, but full passive ROM. She is neurovascularly intact and 5/5 strength in hand and fingers. She doe shave clicking/cathcing sensation during flexion that is appreciable on exam.  I counseled her regarding activity modification and conservative therapy with bracing and NSAIDs. If pain does not improve in 6 weeks than she may need a steroid injection.  Plan: - Prescribe curved finger splint - OTC NSAID for pain - Follow up in 6 weeks

## 2020-07-14 NOTE — Assessment & Plan Note (Signed)
>>  ASSESSMENT AND PLAN FOR OSTEOARTHRITIS OF PROXIMAL INTERPHALANGEAL (PIP) JOINT OF LEFT MIDDLE FINGER WRITTEN ON 07/14/2020  7:57 AM BY COE, BENJAMIN, MD  Patient presents with a 1 month history of middle finger pain and stiffness. She denies precipitating factors such as trauma, over-use, infection or history of OA/gout. She states that the finger will intermittently become stiff and will have difficult with flexion. She does admit to some pain on the palmar surface around the MCP particularly with movement. She notices a clicking noise when she flexes the finger and it feel like the finger is catching on something.  On exam, she has some TTP at the MCP on the palmar surface, but no skin changes, erythema, effusions, or bony/soft tissue deformities. She has slightly reduced active ROM with flexion, but full passive ROM. She is neurovascularly intact and 5/5 strength in hand and fingers. She doe shave clicking/cathcing sensation during flexion that is appreciable on exam.  I counseled her regarding activity modification and conservative therapy with bracing and NSAIDs. If pain does not improve in 6 weeks than she may need a steroid injection.  Plan: - Prescribe curved finger splint - OTC NSAID for pain - Follow up in 6 weeks

## 2020-07-19 NOTE — Progress Notes (Signed)
Internal Medicine Clinic Attending  Case discussed with Dr. Coe  At the time of the visit.  We reviewed the resident's history and exam and pertinent patient test results.  I agree with the assessment, diagnosis, and plan of care documented in the resident's note.  

## 2020-07-31 MED FILL — HUMALOG MIX 75-25 KWIKPEN: (75-25) 100 | 18 days supply | Qty: 15 | Fill #1

## 2020-08-08 MED FILL — LISINOPRIL 10 MG TABS: 10 | 30 days supply | Qty: 30 | Fill #1

## 2020-08-08 MED FILL — VICTOZA 18 MG/3 ML INJECT P: 18 | 30 days supply | Qty: 9 | Fill #1

## 2020-08-08 MED FILL — ATORVASTATIN 40 MG TABLET: 40 | 30 days supply | Qty: 30 | Fill #1

## 2020-09-01 ENCOUNTER — Ambulatory Visit (INDEPENDENT_AMBULATORY_CARE_PROVIDER_SITE_OTHER): Payer: Self-pay | Admitting: Student

## 2020-09-01 ENCOUNTER — Other Ambulatory Visit: Payer: Self-pay | Admitting: Student

## 2020-09-01 ENCOUNTER — Encounter: Payer: Self-pay | Admitting: Student

## 2020-09-01 ENCOUNTER — Other Ambulatory Visit: Payer: Self-pay

## 2020-09-01 VITALS — BP 125/68 | HR 77 | Temp 98.3°F | Wt 214.5 lb

## 2020-09-01 DIAGNOSIS — M65332 Trigger finger, left middle finger: Secondary | ICD-10-CM

## 2020-09-01 DIAGNOSIS — E1142 Type 2 diabetes mellitus with diabetic polyneuropathy: Secondary | ICD-10-CM

## 2020-09-01 LAB — POCT GLYCOSYLATED HEMOGLOBIN (HGB A1C): Hemoglobin A1C: 6.7 % — AB (ref 4.0–5.6)

## 2020-09-01 LAB — GLUCOSE, CAPILLARY: Glucose-Capillary: 71 mg/dL (ref 70–99)

## 2020-09-01 MED ORDER — DICLOFENAC SODIUM 1 % EX GEL: 4.0000 g | Freq: Four times a day (QID) | CUTANEOUS | 1 refills | Status: AC

## 2020-09-01 MED FILL — ATORVASTATIN 40 MG TABLET: 40 | 30 days supply | Qty: 30 | Fill #2

## 2020-09-01 MED FILL — DICLOFENAC SODIUM 1 % GEL: 1 | 7 days supply | Qty: 100 | Fill #0

## 2020-09-01 MED FILL — LISINOPRIL 10 MG TABS: 10 | 30 days supply | Qty: 30 | Fill #2

## 2020-09-01 MED FILL — CONTOUR NEXT STRIPS: 25 days supply | Qty: 100 | Fill #2

## 2020-09-01 NOTE — Assessment & Plan Note (Signed)
Patient's HbA1c currently 6.7 down from prior of 8.3. She states that she has been adherent to her evening victoza and insulin 75/25, but has occasionally missed her morning doses of insulin. She has discontinued her metformin several months ago due to intolerance. -Continue victoza -Continue 75/25 insulin (40u in AM and 45u in PM) -Repeat HbA1c in three months

## 2020-09-01 NOTE — Progress Notes (Signed)
CC: Trigger finger, T2DM  HPI:  [name] is a 54 y.o. with past medical history as listed below who presents for evaluation of trigger finger and T2DM follow-up. Please see Encounters Tab for problem based charting.  Past Medical History:  Diagnosis Date  . Anal fissure   . Breast mass   . Bronchitis   . Carpal tunnel syndrome, bilateral   . Cholelithiasis   . Depression   . Diabetes mellitus    Type II  . Essential hypertension   . GERD (gastroesophageal reflux disease)   . Herpes simplex type 1 infection    vaginal  . Hypokalemia    2/2 diarrhea  . Menorrhagia   . Obesity   . Polycystic ovarian disease   . Renal calculi   . Renal calculus   . Sleep apnea    no cpap  . Sleep apnea, obstructive    Family History  Problem Relation Age of Onset  . Hypertension Mother   . Diabetes Father   . Colon cancer Neg Hx   . Esophageal cancer Neg Hx   . Stomach cancer Neg Hx   . Rectal cancer Neg Hx    Social History   Tobacco Use  . Smoking status: Former Smoker    Types: Cigarettes    Quit date: 12/30/1978    Years since quitting: 41.7  . Smokeless tobacco: Never Used  Vaping Use  . Vaping Use: Never used  Substance Use Topics  . Alcohol use: No    Alcohol/week: 0.0 standard drinks  . Drug use: No   Review of Systems:  Denies fevers, chills, abdominal pain, nausea, vomiting, diarrhea, chest pain, shortness of breath.  Physical Exam:  Vitals:   09/01/20 1355  BP: 125/68  Pulse: 77  Temp: 98.3 F (36.8 C)  TempSrc: Oral  SpO2: 98%  Weight: 214 lb 8 oz (97.3 kg)   Physical Exam Constitutional:      Appearance: Normal appearance.  HENT:     Head: Normocephalic and atraumatic.  Eyes:     Extraocular Movements: Extraocular movements intact.     Conjunctiva/sclera: Conjunctivae normal.  Cardiovascular:     Rate and Rhythm: Normal rate and regular rhythm.     Pulses: Normal pulses.     Heart sounds: Normal heart sounds.  Pulmonary:      Effort: Pulmonary effort is normal.     Breath sounds: Normal breath sounds.  Abdominal:     General: Abdomen is flat. Bowel sounds are normal.     Palpations: Abdomen is soft.     Tenderness: There is no abdominal tenderness.  Musculoskeletal:        General: Tenderness present. No swelling, deformity or signs of injury.     Cervical back: Normal range of motion and neck supple.     Comments: Tenderness to palpation along the MCP and PIP joints of the third digit of the left hand. Mildly diminished flexion of the third digit. No overlying erythema, swelling, or bony deformity.  Skin:    General: Skin is warm and dry.     Capillary Refill: Capillary refill takes less than 2 seconds.     Findings: No erythema.  Neurological:     General: No focal deficit present.     Mental Status: She is alert. Mental status is at baseline.  Psychiatric:        Mood and Affect: Mood normal.        Behavior: Behavior normal.  Thought Content: Thought content normal.        Judgment: Judgment normal.    Assessment & Plan:   See Encounters Tab for problem based charting.  Patient seen with Dr. Mayford Knife

## 2020-09-01 NOTE — Assessment & Plan Note (Signed)
Patient continues to endorse pain throughout dorsal aspect of left third digit and metacarpal with flexion of the digit. She states that this impairs her ability to pick up crates at work. She has been taking ibuprofen in the morning and evening which she reports relieves her pain, and she has been wearing the curved finger splint that was offered to her on and off. On physical examination, she does have some mild tenderness to palpation along the MCP and PIP joints of the third digit with slightly reduced active range of motion with flexion. Upon active range of motion, there is appreciable catching of the digit during flexion. Otherwise, there is no overlying erythema, swelling or bony deformities. She mentions having an interest in seeing an orthopedic hand surgeon, however she does not have insurance. She also demonstrates interest in a steroid injection. -Continue conservative management:  -Continue ibuprofen  -Add tylenol if pain unimproved with ibuprofen alone  -Prescribed voltaren gel, apply as needed  -Wear finger splint when tolerable -If continues to be unimproved, can consider steroid injection. -If patient obtains insurance, can consider referral to hand surgeon

## 2020-09-01 NOTE — Assessment & Plan Note (Signed)
>>  ASSESSMENT AND PLAN FOR OSTEOARTHRITIS OF PROXIMAL INTERPHALANGEAL (PIP) JOINT OF LEFT MIDDLE FINGER WRITTEN ON 09/01/2020  3:58 PM BY VICCI COUGH, MD  Patient continues to endorse pain throughout dorsal aspect of left third digit and metacarpal with flexion of the digit. She states that this impairs her ability to pick up crates at work. She has been taking ibuprofen  in the morning and evening which she reports relieves her pain, and she has been wearing the curved finger splint that was offered to her on and off. On physical examination, she does have some mild tenderness to palpation along the MCP and PIP joints of the third digit with slightly reduced active range of motion with flexion. Upon active range of motion, there is appreciable catching of the digit during flexion. Otherwise, there is no overlying erythema, swelling or bony deformities. She mentions having an interest in seeing an orthopedic hand surgeon, however she does not have insurance. She also demonstrates interest in a steroid injection. -Continue conservative management:  -Continue ibuprofen   -Add tylenol  if pain unimproved with ibuprofen  alone  -Prescribed voltaren  gel, apply as needed  -Wear finger splint when tolerable -If continues to be unimproved, can consider steroid injection. -If patient obtains insurance, can consider referral to hand surgeon

## 2020-09-01 NOTE — Patient Instructions (Signed)
Thank you for visiting [place] in clinic today, it was very nice to meet you.  For your trigger finger, we suggest applying voltaren gel to your finger and hand daily for control of your symptoms. If this continues to be uncontrolled, we can discuss trying a steroid injection.

## 2020-09-05 ENCOUNTER — Other Ambulatory Visit: Payer: Self-pay | Admitting: Student

## 2020-09-05 DIAGNOSIS — E1142 Type 2 diabetes mellitus with diabetic polyneuropathy: Secondary | ICD-10-CM

## 2020-09-05 MED ORDER — INSULIN LISPRO PROT & LISPRO (75-25 MIX) 100 UNIT/ML KWIKPEN: PEN_INJECTOR | SUBCUTANEOUS | 1 refills | Status: DC

## 2020-09-05 MED ORDER — VICTOZA 18 MG/3ML ~~LOC~~ SOPN: PEN_INJECTOR | SUBCUTANEOUS | 1 refills | Status: DC

## 2020-09-05 MED FILL — HUMALOG MIX 75-25 KWIKPEN: (75-25) 100 | 18 days supply | Qty: 15 | Fill #0

## 2020-09-05 MED FILL — VICTOZA 18 MG/3 ML INJECT P: 18 | 30 days supply | Qty: 9 | Fill #0

## 2020-09-06 NOTE — Progress Notes (Signed)
DOS 09/01/20:  Internal Medicine Clinic Attending  I saw and evaluated the patient.  I personally confirmed the key portions of the history and exam documented by Dr. Laural Benes and I reviewed pertinent patient test results.  The assessment, diagnosis, and plan were formulated together and I agree with the documentation in the resident's note.

## 2020-10-06 ENCOUNTER — Other Ambulatory Visit: Payer: Self-pay | Admitting: Internal Medicine

## 2020-10-06 DIAGNOSIS — E1142 Type 2 diabetes mellitus with diabetic polyneuropathy: Secondary | ICD-10-CM

## 2020-10-06 MED FILL — CONTOUR NEXT STRIPS: 25 days supply | Qty: 100 | Fill #3

## 2020-10-06 MED FILL — HUMALOG MIX 75-25 KWIKPEN: (75-25) 100 | 18 days supply | Qty: 15 | Fill #1

## 2020-10-06 MED FILL — VICTOZA 18 MG/3 ML INJECT P: 18 | 30 days supply | Qty: 9 | Fill #1

## 2020-10-06 MED FILL — ATORVASTATIN 40 MG TABLET: 40 | 30 days supply | Qty: 30 | Fill #3

## 2020-10-06 MED FILL — GABAPENTIN 300 MG CAPSULE: 300 | 30 days supply | Qty: 90 | Fill #0

## 2020-10-06 MED FILL — LISINOPRIL 10 MG TABS: 10 | 30 days supply | Qty: 30 | Fill #3

## 2020-10-09 ENCOUNTER — Ambulatory Visit (INDEPENDENT_AMBULATORY_CARE_PROVIDER_SITE_OTHER): Payer: Self-pay | Admitting: Student in an Organized Health Care Education/Training Program

## 2020-10-09 ENCOUNTER — Ambulatory Visit (HOSPITAL_COMMUNITY)
Admission: RE | Admit: 2020-10-09 | Discharge: 2020-10-09 | Disposition: A | Discharge status: Home / Self Care | Payer: Self-pay | Source: Ambulatory Visit | Attending: Student in an Organized Health Care Education/Training Program | Admitting: Student in an Organized Health Care Education/Training Program

## 2020-10-09 ENCOUNTER — Encounter: Payer: Self-pay | Admitting: Internal Medicine

## 2020-10-09 ENCOUNTER — Other Ambulatory Visit: Payer: Self-pay

## 2020-10-09 VITALS — BP 134/61 | HR 66 | Temp 97.9°F | Ht 66.0 in | Wt 211.7 lb

## 2020-10-09 DIAGNOSIS — M4690 Unspecified inflammatory spondylopathy, site unspecified: Secondary | ICD-10-CM | POA: Insufficient documentation

## 2020-10-09 DIAGNOSIS — E119 Type 2 diabetes mellitus without complications: Secondary | ICD-10-CM

## 2020-10-09 DIAGNOSIS — I10 Essential (primary) hypertension: Secondary | ICD-10-CM

## 2020-10-09 NOTE — Assessment & Plan Note (Signed)
3 months of left hand pain especially focused around the third digit.  On exam today she has diffuse swelling of the second and third digits, looks like dactylitis.  No underlying history of inflammatory autoimmune disease, but presentation is suspicious for some kind of peripheral spondyloarthritis.  We will plan to do work-up for autoimmune disease today.  We will check serum RA, anti-CCP, ANA, ENA panel, ESR, CRP.  No history of psoriasis to suggest psoriatic arthritis, but that is a possibility.  Will check x-ray of the left hand to look for erosive changes of the joint.  Further treatment will be pending those results.

## 2020-10-09 NOTE — Progress Notes (Signed)
   Assessment and Plan:  See Encounters tab for problem-based medical decision making.   __________________________________________________________  HPI:   54 year old person living with diabetes and hypertension sent here for follow-up of a possible trigger finger.  She had some pain in the left third digits for about 3 months, says it started after hitting the hand on a cart.  Felt like she had some catching of the finger.  There is some stiffness in the left hand, but she has a lot of soreness with flexion of the finger, unable to fully close her fist.  She is left-handed.  No fevers or chills.  No personal history of autoimmune disease.  No family history of autoimmune disease.  Denies other morning stiffness.  No night sweats.  No recent rashes.  __________________________________________________________  Problem List: Patient Active Problem List   Diagnosis Date Noted  . 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 Breast lump on right side at 1 o'clock position 05/16/2020  . Chronic diarrhea 03/09/2020  . Pulmonary nodule 02/12/2019  . Osteoarthritis 05/12/2018  . Diabetic polyneuropathy (HCC) 02/02/2017  . Postural dizziness with presyncope 11/29/2013  . Hypercholesteremia 11/05/2012  . Cystitis 04/10/2012  . Hypertension 01/14/2008  . Type 2 diabetes mellitus with peripheral neuropathy (HCC) 12/04/2006  . Depression, major, recurrent, in remission (HCC) 12/04/2006  . SLEEP APNEA, OBSTRUCTIVE 12/04/2006  . GERD 12/04/2006    Medications: Reconciled today in Epic __________________________________________________________  Physical Exam:  Vital Signs: Vitals:   10/09/20 1540  BP: 134/61  Pulse: 66  Temp: 97.9 F (36.6 C)  TempSrc: Oral  SpO2: 100%  Weight: 211 lb 11.2 oz (96 kg)  Height: 5\' 6"  (1.676 m)    Gen: Well appearing, NAD Ext: Warm, no edema, diffuse swelling of the left second and third digits.  Mild synovitis of the left PIP of  the third digit. Skin: No atypical appearing moles. No rashes

## 2020-10-09 NOTE — Assessment & Plan Note (Signed)
>>  ASSESSMENT AND PLAN FOR OSTEOARTHRITIS OF PROXIMAL INTERPHALANGEAL (PIP) JOINT OF LEFT MIDDLE FINGER WRITTEN ON 10/09/2020  4:28 PM BY VINCENT, CLEATUS NED, MD  3 months of left hand pain especially focused around the third digit.  On exam today she has diffuse swelling of the second and third digits, looks like dactylitis.  No underlying history of inflammatory autoimmune disease, but presentation is suspicious for some kind of peripheral spondyloarthritis.  We will plan to do work-up for autoimmune disease today.  We will check serum RA, anti-CCP, ANA, ENA panel, ESR, CRP.  No history of psoriasis to suggest psoriatic arthritis, but that is a possibility.  Will check x-ray of the left hand to look for erosive changes of the joint.  Further treatment will be pending those results.

## 2020-10-11 LAB — FANA STAINING PATTERNS
Speckled Pattern: 1:320 {titer} — ABNORMAL HIGH
Spindle Apparatus Pattern: 1:320 {titer} — ABNORMAL HIGH

## 2020-10-11 LAB — ANTINUCLEAR ANTIBODIES, IFA: ANA Titer 1: POSITIVE — AB

## 2020-10-11 LAB — CBC
Hematocrit: 42.5 % (ref 34.0–46.6)
Hemoglobin: 13.6 g/dL (ref 11.1–15.9)
MCH: 28.9 pg (ref 26.6–33.0)
MCHC: 32 g/dL (ref 31.5–35.7)
MCV: 90 fL (ref 79–97)
Platelets: 296 10*3/uL (ref 150–450)
RBC: 4.7 x10E6/uL (ref 3.77–5.28)
RDW: 12.3 % (ref 11.7–15.4)
WBC: 6 10*3/uL (ref 3.4–10.8)

## 2020-10-11 LAB — ANTI-DNA ANTIBODY, DOUBLE-STRANDED: dsDNA Ab: 1 IU/mL (ref 0–9)

## 2020-10-11 LAB — RHEUMATOID FACTOR: Rheumatoid fact SerPl-aCnc: <10 IU/mL (ref 0.0–13.9)

## 2020-10-11 LAB — SEDIMENTATION RATE: Sed Rate: 18 mm/hr (ref 0–40)

## 2020-10-11 LAB — CYCLIC CITRUL PEPTIDE ANTIBODY, IGG/IGA: Cyclic Citrullin Peptide Ab: 5 units (ref 0–19)

## 2020-10-11 LAB — ANTIEXTRACTABLE NUCLEAR AG
ENA RNP Ab: <0.2 AI (ref 0.0–0.9)
ENA SM Ab Ser-aCnc: <0.2 AI (ref 0.0–0.9)

## 2020-10-11 LAB — C-REACTIVE PROTEIN: CRP: <1 mg/L (ref 0–10)

## 2020-10-11 LAB — URIC ACID: Uric Acid: 4.3 mg/dL (ref 3.0–7.2)

## 2020-10-12 ENCOUNTER — Telehealth: Payer: Self-pay | Admitting: Student in an Organized Health Care Education/Training Program

## 2020-10-12 ENCOUNTER — Other Ambulatory Visit: Payer: Self-pay | Admitting: Student in an Organized Health Care Education/Training Program

## 2020-10-12 MED ORDER — PREDNISONE 20 MG PO TABS: ORAL_TABLET | ORAL | 0 refills | Status: DC

## 2020-10-12 MED FILL — predniSONE 20 MG TABS: 20 | 11 days supply | Qty: 19 | Fill #0

## 2020-10-12 NOTE — Telephone Encounter (Signed)
I spoke with [name] today by phone. She is still having left hand stiffness and pain which is function limiting. We talked about her test results, which includes an elevated ANA with high titer, in speckeled pattern, which raises the possibility of underlying SLE or MCTD as the cause of her synovitis. I told her that we do not yet have enough information to make a definitive diagnosis. I would like to refer her to rheumatology, but she will have limited access due to uninsured status. She is going to talk to her boss at [employer] today to see if she can sign up for health insurance. It is open enrollment season, so this may be available for her. Once she has insurance, I think we should refer her to rheumatology.   In the mean time, I am going to treat this synovitis with Prednisone taper over 2 weeks. She has diabetes requiring insuilin. We talked about the possibility of hyperglycemia as a result. [name], can you call the patient in a few days to check in on her glucose readings and help [place] adjust her insulin regimen as needed?

## 2020-10-12 NOTE — Telephone Encounter (Addendum)
Steroid-Induced Hyperglycemia Prevention and Management Laureen P Whelpley is a 54 y.o. female who meets criteria for [facility] glucose monitoring program (diabetes patient prescribed short course of steroids).  A/P Current Regimen  Patient prescribed prednisone 60-40-20-10 mg daily x 19 days, currently on day 0- she plans to start tomorrow of therapy.  Prednisone indication: synovotis  Current DM regimen Humalog Mix 75/25, 40 units every morning and 45 units every evening, victoza 1.8 mg daily  Home BG Monitoring  Patient does have a meter at home and does check BG at home. Meter was not supplied.  CBGs at home 180-250, A1C prior to steroid course 6-7%  S/Sx of hyper- or hypoglycemia: none, none  Medication Management   Additional treatment for BG control is not indicated at this time.  Physician preference level per protocol: 1  Medication supply Patient will use own medication  Patient Education  Advised patient to monitor BG while on steroid therapy (at least twice daily prior to first 2 meals of the day).  Patient educated about signs/symptoms and advised to contact clinic if hyper- or hypoglycemic.  Patient did  verbalize understanding of information and regimen by repeating back topics discussed.  Follow-up daily  11-02-20  [name] Keslee Harrington 11:39 AM 10/12/2020

## 2020-10-16 NOTE — Telephone Encounter (Signed)
Prevention of steroid induced hyperglycemia follow up:   Steroids:has not taken today yet because she was off from work - has not been out of bed. She states that she took the steroids before work yesterday 6-7 AM.  Blood sugar: 220-240-300-198 is the lowest before she checked today while we were on the phone; the result was 113 right now. Only eats lunch and dinner or one meal & snacks.  Diabetes Medicine:she increased her insulin to  50-55 units at night when she saw the higher numbers, but when it is lower such as 198 - she states she tkes her usual dose of 40 in the morning. She states she takes her evening dose of insulin before bed which is 11pm -1am. She does not eat at this time. She states she often does not know when she is going to eat because of her work schedule  Hypoglycemia symptoms:no Hyperglycemia symptoms: no  Education: Discussed taking steroids and insulin in the morning and at consistent times of day. Advised her type of insulin is designed to be taken before meals and if she eats at work then se should take hr insulin to work with her vs consider talking to doctor about basal bolus therapy Plan: 45 units Humalog Mix 75/25 twice daily. Check blood sugar again tonight and tomorrow morning, take steroids as soon as possible today with am insulin and mea;/snack. Call tomorrow ~ 230 PM before she goes to work [employer], RD 10/16/2020 3:14 PM.

## 2020-10-17 NOTE — Telephone Encounter (Signed)
Prevention of steroid induced hyperglycemia follow up:   Steroids: took 60 mg this am, did not take them yesterday  Blood sugar: 187 this am, 249 this afternoon Yesterday in the 200s. Diabetes Medicine: 45 units Humalog Mix 75/25 last night and again this am at  the same time she took the steroids  Hypoglycemia symptoms; denies Hyperglycemia symptoms: denies  Education: take steroids and am insulin about the same time to try to match peaks where they both have their most effect- one increasing her blood sugar and the other decreasing her blood sugar.   Plan: per protocol will  Temporarily increase her am dose to 55 units of 75/25 Humalog Mix, no change in her PM dose.  Call her tomorrow afternoon Norm Parcel, RD 10/17/2020 1:40 PM.

## 2020-10-18 NOTE — Telephone Encounter (Signed)
Prevention of steroid induced hyperglycemia follow up:  Tried calling this patient. Their voicemail box is not set up. I was unable to leave a message    Norm Parcel, RD 10/18/2020 5:36 PM.

## 2020-10-19 NOTE — Telephone Encounter (Signed)
Tried calling this patient two times. Her voicemail box is not set up. I was unable to leave a message

## 2020-10-20 ENCOUNTER — Telehealth: Payer: Self-pay | Admitting: Student in an Organized Health Care Education/Training Program

## 2020-10-20 NOTE — Telephone Encounter (Signed)
Called to follow up on the patients finger pain, which I think may be a dactylitis/synovitis due to an underlying SLE or MCTD. She is on a prednisone taper and finding some improvement in stiffness and function. Finger is not back to normal yet. Blood sugars are doing ok, she is adjusting her insulin dosing appropriately. She is going to sign up for employer-based health insurance in November which will take effect in January. Will have the option for rheumatology referral at that time to firm up the underlying diagnosis here.

## 2020-10-20 NOTE — Telephone Encounter (Signed)
Prevention of steroid induced hyperglycemia follow up:   Steroids:2nd dose of 2(40 mg daily) Blood sugar: she states her evening readings are in the 200s, her fasting reading today was 117 mg/dl and she reportss most morning readings are in the low 100s.   Diabetes Medicine: Hypoglycemia symptoms- she denies Hyperglycemia symptoms: she denies  Plan: keep checking blood sugar twice a day, increase before breakfasty insulin dose to 60 units and PM insulin dose stays at 45 units. Call Ms [facility] Monday afternoon.  Norm Parcel, RD 10/20/2020 10:49 AM.

## 2020-10-23 NOTE — Telephone Encounter (Signed)
Tried calling this patient. Their voicemail box is not set up. I was unable to leave a message  

## 2020-10-24 NOTE — Telephone Encounter (Signed)
Tried calling this patient. Their voicemail box is not set up. I was unable to leave a message  

## 2020-10-25 NOTE — Telephone Encounter (Signed)
Tried calling this patient. Their voicemail box is not set up. I was unable to leave a message. Will try to call her again tomorrow. She has an appointment on 11-02-20 with Dr. Gwyneth Revels.

## 2020-10-26 NOTE — Telephone Encounter (Signed)
Tried calling this patient. Their voicemail box is not set up. I was unable to leave a message  

## 2020-11-02 ENCOUNTER — Other Ambulatory Visit: Payer: Self-pay

## 2020-11-02 ENCOUNTER — Other Ambulatory Visit: Payer: Self-pay | Admitting: Internal Medicine

## 2020-11-02 ENCOUNTER — Encounter: Payer: Self-pay | Admitting: Internal Medicine

## 2020-11-02 ENCOUNTER — Ambulatory Visit: Payer: Self-pay | Admitting: Internal Medicine

## 2020-11-02 VITALS — BP 134/73 | HR 73 | Temp 97.8°F | Ht 67.0 in | Wt 216.1 lb

## 2020-11-02 DIAGNOSIS — M79645 Pain in left finger(s): Secondary | ICD-10-CM

## 2020-11-02 DIAGNOSIS — E1142 Type 2 diabetes mellitus with diabetic polyneuropathy: Secondary | ICD-10-CM

## 2020-11-02 DIAGNOSIS — Z23 Encounter for immunization: Secondary | ICD-10-CM

## 2020-11-02 MED ORDER — INSULIN LISPRO PROT & LISPRO (75-25 MIX) 100 UNIT/ML KWIKPEN: PEN_INJECTOR | SUBCUTANEOUS | 1 refills | Status: DC

## 2020-11-02 MED FILL — HUMALOG MIX 75-25 KWIKPEN: (75-25) 100 | 30 days supply | Qty: 30 | Fill #0

## 2020-11-02 NOTE — Assessment & Plan Note (Signed)
Patient reports that she continues have left finger pain states that last year around this was helping however she finished the course of the week ago.  Approximately 2 days ago the pain returned, some pain at the base of the 3rd finger, difficulty closing her hands, pain is 5-6 out of 10, and pain is worse with movement or when she hits it against something.  No redness, warmth, or rash.  She reports that while she was on the steroids she was able to move more.  She also reports that her finger will "click" when she tries to extend it. On exam she has some tenderness at the 3rd metacarpal phalangeal joint of her left hand, pain is worse with active movement and active flexion, no warmth, rash, or swelling noted.  She has some locking/snapping with extension.  She was seen on 10/4.  Labs were significant for an positive ANA, 1: 320 speckled pattern.  Her RF, CCP, double-stranded DNA, CRP, ESR were all within normal limits.  X-ray showed minimal osteoarthritis, no arthropathy noted.  Today she has less swelling in the joint, well this could still be related to a arthropathy from an autoimmune condition current presentation appears consistent with trigger finger.  Discussed risk and benefits of steroid injection and she is in agreement.  -Steroid injection performed today, patient reported some improvement in pain, continue to have snapping sensation -Advised to contact us in 1-2 weeks if pain persist, can try another steroid injection at that time -Advised to continue Tylenol and ibuprofen as needed for pain -We'll hold off on further autoimmune testing for now however if pain persist or swelling worsens can consider checking SCL-70,SS-A/SS-B, Sm antigen.  -Can refer to rheumatology once patient obtains insurance

## 2020-11-02 NOTE — Patient Instructions (Signed)
Amy Norris,  It was a pleasure to see you today. Thank you for coming in.   Today we discussed your finger pain. In regards to this you received a steroid injection today. You can continue using tylenol and ibuprofen as needed for pain. We can retry the steroid injection in a few weeks if your pain persists.   We also discussed your diabetes. Please continue using the current insulin dose. Please bring in your meter on your next visit.   Please return to clinic as needed.   Thank you again for coming in.   Claudean Severance.D.

## 2020-11-02 NOTE — Progress Notes (Signed)
   CC: Left finger pain, DM  HPI:  Amy Norris is a 54 y.o. with history listed below presenting for follow-up of her left finger pain and diabetes.  Past Medical History:  Diagnosis Date  . Anal fissure   . Breast mass   . Bronchitis   . Carpal tunnel syndrome, bilateral   . Cholelithiasis   . Depression   . Diabetes mellitus    Type II  . Essential hypertension   . GERD (gastroesophageal reflux disease)   . Herpes simplex type 1 infection    vaginal  . Hypokalemia    2/2 diarrhea  . Menorrhagia   . Obesity   . Polycystic ovarian disease   . Renal calculi   . Renal calculus   . Sleep apnea    no cpap  . Sleep apnea, obstructive    Review of Systems:   Constitutional: Negative for chills and fever.  Respiratory: Negative for shortness of breath.   Cardiovascular: Negative for chest pain and leg swelling.  Gastrointestinal: Negative for abdominal pain, nausea and vomiting.  Neurological: Negative for dizziness and headaches.  Musculoskeletal: Positive for left finger pain, limited hand movement.  Physical Exam:  Vitals:   11/02/20 1430  BP: 134/73  Pulse: 73  Temp: 97.8 F (36.6 C)  TempSrc: Oral  SpO2: 98%  Weight: 216 lb 1.6 oz (98 kg)  Height: 5\' 7"  (1.702 m)   Physical Exam Constitutional:      Appearance: Normal appearance.  Cardiovascular:     Rate and Rhythm: Normal rate and regular rhythm.     Pulses: Normal pulses.     Heart sounds: Normal heart sounds.  Pulmonary:     Effort: Pulmonary effort is normal.     Breath sounds: Normal breath sounds.  Abdominal:     General: Abdomen is flat. Bowel sounds are normal.     Palpations: Abdomen is soft.  Musculoskeletal:        General: Tenderness present.     Comments: Tenderness at the 3rd metacarpal phalangeal joint of her left hand, pain with active flexion, no warmth, rash, or swelling noted. Unable to fully flex.   Skin:    General: Skin is warm and dry.     Capillary Refill: Capillary  refill takes less than 2 seconds.  Neurological:     General: No focal deficit present.     Mental Status: She is alert and oriented to person, place, and time.     Assessment & Plan:   See Encounters Tab for problem based charting.  Patient discussed with Dr. 

## 2020-11-02 NOTE — Assessment & Plan Note (Signed)
Patient is currently on Victoza 1.8 mg daily in insulin 75/25 50 units in the a.m. and 45 units in the p.m.  She initially increased her insulin to 60 units in a.m. and 45 units in the p.m. while she was on the steroids due to elevated blood sugars.  She reports some improvement in her blood sugars, around 100-2 100s, she does not have her meter with her at this time.  We'll hold off on additional steroids at this time and continue on the higher dose of insulin. -Updated insulin to 50 units in a.m. and 45 units in p.m. -Continue Victoza 1.8 mg daily

## 2020-11-07 NOTE — Progress Notes (Signed)
Internal Medicine Clinic Attending  I saw and evaluated the patient.  I personally confirmed the key portions of the history and exam documented by Dr. Gwyneth Revels and I reviewed pertinent patient test results.  The assessment, diagnosis, and plan were formulated together and I agree with the documentation in the resident's note.  Exam consistent with trigger finger of left 3rd digit. Dr Gwyneth Revels preformed steroid injection using 0.5cc (20mg ) kenalog with 1cc 1% lidocaine.  Some improvement after infection but locking not completely resolved.  Discussed follow up in 2 weeks or so, may attempt second injection.  I do not think this is directly related to an inflammatory arthritis.

## 2020-11-09 ENCOUNTER — Other Ambulatory Visit: Payer: Self-pay | Admitting: Student

## 2020-11-09 ENCOUNTER — Other Ambulatory Visit: Payer: Self-pay | Admitting: Internal Medicine

## 2020-11-09 DIAGNOSIS — R1111 Vomiting without nausea: Secondary | ICD-10-CM

## 2020-11-09 MED FILL — ONDANSETRON HCL 4 MG TABLET: 4 | 30 days supply | Qty: 30 | Fill #0

## 2020-11-27 ENCOUNTER — Other Ambulatory Visit: Payer: Self-pay | Admitting: Internal Medicine

## 2020-11-27 DIAGNOSIS — E1142 Type 2 diabetes mellitus with diabetic polyneuropathy: Secondary | ICD-10-CM

## 2020-11-27 MED FILL — LISINOPRIL 10 MG TABS: 10 | 30 days supply | Qty: 30 | Fill #4

## 2020-11-27 MED FILL — ONDANSETRON HCL 4 MG TABLET: 4 | 30 days supply | Qty: 30 | Fill #0

## 2020-11-27 MED FILL — HUMALOG MIX 75-25 KWIKPEN: (75-25) 100 | 30 days supply | Qty: 30 | Fill #0

## 2020-11-27 MED FILL — ATORVASTATIN 40 MG TABLET: 40 | 30 days supply | Qty: 30 | Fill #4

## 2020-11-27 MED FILL — GABAPENTIN 300 MG CAPSULE: 300 | 30 days supply | Qty: 90 | Fill #1

## 2020-11-28 ENCOUNTER — Other Ambulatory Visit: Payer: Self-pay | Admitting: Internal Medicine

## 2020-11-28 MED FILL — VICTOZA 18 MG/3 ML INJECT P: 18 | 30 days supply | Qty: 9 | Fill #0

## 2020-12-19 ENCOUNTER — Other Ambulatory Visit: Payer: Self-pay | Admitting: Internal Medicine

## 2020-12-19 ENCOUNTER — Other Ambulatory Visit: Payer: Self-pay | Admitting: Student

## 2020-12-19 DIAGNOSIS — E1142 Type 2 diabetes mellitus with diabetic polyneuropathy: Secondary | ICD-10-CM

## 2020-12-19 MED FILL — ONDANSETRON HCL 4 MG TABLET: 4 | 30 days supply | Qty: 30 | Fill #1

## 2020-12-19 MED FILL — VICTOZA 18 MG/3 ML INJECT P: 18 | 30 days supply | Qty: 9 | Fill #0

## 2020-12-19 MED FILL — LISINOPRIL 10 MG TABS: 10 | 30 days supply | Qty: 30 | Fill #5

## 2020-12-19 MED FILL — ATORVASTATIN 40 MG TABLET: 40 | 30 days supply | Qty: 30 | Fill #5

## 2020-12-19 MED FILL — HUMALOG MIX 75-25 KWIKPEN: (75-25) 100 | 30 days supply | Qty: 30 | Fill #0

## 2021-01-02 ENCOUNTER — Encounter: Payer: Self-pay | Admitting: Internal Medicine

## 2021-03-09 ENCOUNTER — Other Ambulatory Visit: Payer: Self-pay | Admitting: Student

## 2021-03-09 ENCOUNTER — Other Ambulatory Visit: Payer: Self-pay | Admitting: Internal Medicine

## 2021-03-09 DIAGNOSIS — R1111 Vomiting without nausea: Secondary | ICD-10-CM

## 2021-03-09 DIAGNOSIS — E7800 Pure hypercholesterolemia, unspecified: Secondary | ICD-10-CM

## 2021-03-09 DIAGNOSIS — I10 Essential (primary) hypertension: Secondary | ICD-10-CM

## 2021-03-09 MED FILL — GABAPENTIN 300 MG CAPSULE: 300 | 30 days supply | Qty: 90 | Fill #0

## 2021-03-09 MED FILL — VICTOZA 18 MG/3 ML INJECT P: 18 | 30 days supply | Qty: 9 | Fill #1

## 2021-03-09 MED FILL — CONTOUR NEXT STRIPS: 25 days supply | Qty: 100 | Fill #4

## 2021-03-12 ENCOUNTER — Other Ambulatory Visit: Payer: Self-pay | Admitting: *Deleted

## 2021-03-12 ENCOUNTER — Other Ambulatory Visit: Payer: Self-pay | Admitting: Internal Medicine

## 2021-03-12 DIAGNOSIS — E7800 Pure hypercholesterolemia, unspecified: Secondary | ICD-10-CM

## 2021-03-12 DIAGNOSIS — E1142 Type 2 diabetes mellitus with diabetic polyneuropathy: Secondary | ICD-10-CM

## 2021-03-12 DIAGNOSIS — I10 Essential (primary) hypertension: Secondary | ICD-10-CM

## 2021-03-12 MED ORDER — INSULIN LISPRO PROT & LISPRO (75-25 MIX) 100 UNIT/ML KWIKPEN: PEN_INJECTOR | SUBCUTANEOUS | 1 refills | Status: DC

## 2021-03-12 MED ORDER — ATORVASTATIN CALCIUM 40 MG PO TABS: 40.0000 mg | ORAL_TABLET | Freq: Every day | ORAL | 5 refills | Status: DC

## 2021-03-12 MED ORDER — LISINOPRIL 10 MG PO TABS: 10.0000 mg | ORAL_TABLET | Freq: Every day | ORAL | 5 refills | Status: DC

## 2021-03-16 ENCOUNTER — Telehealth: Payer: Self-pay

## 2021-03-16 NOTE — Telephone Encounter (Signed)
Please call pt back about swollen hand/finger.

## 2021-03-16 NOTE — Telephone Encounter (Signed)
RTC, patient c/o Left middle finger that is swollen, hard to bend.  States it has happened before and she has received a cortisone injection.  Requesting an appt  Appt made on 03/21/21 @0845  w/ Dr. , RN,BSN

## 2021-03-21 ENCOUNTER — Ambulatory Visit: Payer: Self-pay | Admitting: Internal Medicine

## 2021-03-21 ENCOUNTER — Other Ambulatory Visit: Payer: Self-pay

## 2021-03-21 ENCOUNTER — Other Ambulatory Visit: Payer: Self-pay | Admitting: Internal Medicine

## 2021-03-21 ENCOUNTER — Encounter: Payer: Self-pay | Admitting: Internal Medicine

## 2021-03-21 VITALS — BP 143/75 | HR 72 | Temp 97.9°F | Wt 215.0 lb

## 2021-03-21 DIAGNOSIS — M4690 Unspecified inflammatory spondylopathy, site unspecified: Secondary | ICD-10-CM

## 2021-03-21 DIAGNOSIS — M152 Bouchard's nodes (with arthropathy): Secondary | ICD-10-CM

## 2021-03-21 DIAGNOSIS — R911 Solitary pulmonary nodule: Secondary | ICD-10-CM

## 2021-03-21 DIAGNOSIS — E1142 Type 2 diabetes mellitus with diabetic polyneuropathy: Secondary | ICD-10-CM

## 2021-03-21 LAB — POCT GLYCOSYLATED HEMOGLOBIN (HGB A1C): Hemoglobin A1C: 12 % — AB (ref 4.0–5.6)

## 2021-03-21 LAB — GLUCOSE, CAPILLARY: Glucose-Capillary: 420 mg/dL — ABNORMAL HIGH (ref 70–99)

## 2021-03-21 MED ORDER — NAPROXEN 500 MG PO TABS: 500.0000 mg | ORAL_TABLET | Freq: Two times a day (BID) | ORAL | 0 refills | Status: DC

## 2021-03-21 MED FILL — NAPROXEN 500 MG TABLET: 500 | 14 days supply | Qty: 28 | Fill #0

## 2021-03-21 NOTE — Assessment & Plan Note (Addendum)
Pain in her 3rd left finger has returned over the last month. She has trouble closing her finger all the way and has pain along entire finger which now radiates down in her palm as well. She endorses swelling of this finger consistently but sometimes the others as well. She has no difficulty with straightening it. She previously received a steroid injection in the base of the finger which she states did help for a couple of months. No locking or popping of the finger has occurred. She has been taking ibuprofen for the past week which has helped slightly.  She denies rash or other joint pain except for 3rd left finger joints and sometimes the 2nd metacarpal joint. She denies dry eyes but endorses dry mouth. No symptoms of reynaud's, dysphagia.  Previously received steroid injection for possible trigger finger. No locking or swelling of joint at the base of the finger currently. Symptoms were also previously helped with steroids. Labs in October were significant for ANA titer 1:320.  CRP, RF, CCP, uric acid, Sed rate, dsDNA, ENA panel were all normal.  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 Discussed with patient.

## 2021-03-21 NOTE — Assessment & Plan Note (Signed)
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 She has trouble closing her finger all the way and has pain along entire finger which now radiates down in her palm as well. She endorses swelling of this finger consistently but sometimes the others as well. She has no difficulty with straightening it. She previously received a steroid injection in the base of the finger which she states did help for a couple of months. No locking or popping of the finger has occurred. She has been taking ibuprofen  for the past week which has helped slightly.  She denies rash or other joint pain except for 3rd left finger joints and sometimes the 2nd metacarpal joint. She denies dry eyes but endorses dry mouth. No symptoms of reynaud's, dysphagia.  Previously received steroid injection for possible trigger finger. No locking or swelling of joint at the base of the finger currently. Symptoms were also previously helped with steroids. Labs in October were significant for ANA titer 1:320.  CRP, RF, CCP, uric acid, Sed rate, dsDNA, ENA panel were all normal.  Most likely etiology is PIP joint osteoarthritis although differential includes dactylitis with arthritis secondary to autoimmune etiology although only ANA has been positive to date and no other signs of other common autoimmune features, tenosynovitis, possible trigger finger although no current consistent symptoms  - no oral or steroid injection today with A1c 12, she agrees with more conservative management for now - naproxen  500 mg bid for two weeks, then voltaren  gel prn  - check ss-a, ss-b, urine protein - continue to use splint, ice - f/u if symptoms worsen or fail to improve  ADDENDUM: Labs normal. Discussed with patient.

## 2021-03-21 NOTE — Assessment & Plan Note (Addendum)
A1c today 12 from 6.7 08/2020. She states has not been taking her victoza or insulin consistently as she has not felt like taking them. She has a history of intermittently stopping her diabetes medications for similar reasons. She was surprised at her a1c and states she understands she needs to take her insulin now. We discussed the importance of these medications and the many increased health risks without them. Additionally discussed that her uncontrolled diabetes could be exacerbating the inflammation in her hand. No current polyuria or polydipsia.   - restart victoza 1.8 mg qd  - restart humalog mix 75/25 50U qam and 45U with dinner - if significant improvement at follow-up consider switching to oral medications which may help with compliance. - check urine microalbumin

## 2021-03-21 NOTE — Assessment & Plan Note (Addendum)
History of incidental "tiny" pulmonary nodules (largest 38mm) on chest CT 01/2019 with recommendation for repeat chest CT in one year depending on risk. Her own smoking history is minimal but has been exposed to second hand smoke for the past 9 years. Chest imaging 2007 without nodules.   - will contact patient to discuss repeat chest CT   ADDENDUM: Discussed risks and benefits and she would like to proceed with repeat chest CT.

## 2021-03-21 NOTE — Progress Notes (Addendum)
   CC: hand pain  HPI:  [name] is a 55 y.o. with PMH as below.   Please see A&P for assessment of the patient's acute and chronic medical conditions.   Pain in her 3rd left finger has returned over the last month. She has trouble closing her finger all the way and has pain along entire finger which now radiates down in her palm as well. She endorses swelling of this finger consistently but sometimes the others as well. She has no difficulty with straightening it. She previously received a steroid injection in the base of the finger which she states did help for a couple of months. No locking or popping of the finger has occurred. She has been taking ibuprofen for the past week which has helped slightly.  She denies rash or other joint pain except for 3rd left finger joints and sometimes the 2nd metacarpal joint. She denies dry eyes but endorses dry mouth. No symptoms of reynaud's, dysphagia.  Previously received steroid injection for possible trigger finger. No locking or swelling of joint at the base of the finger currently. Symptoms were also previously helped with steroids. Labs in October were significant for ANA titer 1:320.  CRP, RF, CCP, uric acid, Sed rate, dsDNA, ENA panel were all normal.   History of incidental "tiny" pulmonary nodules (largest 50mm) on chest CT 01/2019 with recommendation for repeat chest CT in one year depending on risk. Her own smoking history is minimal but has been exposed to second hand smoke for the past 9 years. Chest imaging 2007 without nodules.   A1c today 12 from 6.7 08/2020. She states has not been taking her victoza or insulin consistently as she has not felt like taking them. She was surprised at her a1c and states she understands she needs to take her insulin now. We discussed the importance of these medications and the many increased health risks without them. Additionally discussed that her uncontrolled diabetes could be exacerbating the  inflammation in her hand.   Past Medical History:  Diagnosis Date  . Anal fissure   . Breast mass   . Bronchitis   . Carpal tunnel syndrome, bilateral   . Cholelithiasis   . Depression   . Diabetes mellitus    Type II  . Essential hypertension   . GERD (gastroesophageal reflux disease)   . Herpes simplex type 1 infection    vaginal  . Hypokalemia    2/2 diarrhea  . Menorrhagia   . Obesity   . Polycystic ovarian disease   . Renal calculi   . Renal calculus   . Sleep apnea    no cpap  . Sleep apnea, obstructive    Review of Systems:   10 point ROS negative except as noted in HPI  Physical Exam: Constitution: NAD, appears stated age Cardio: RRR, no m/r/g, no LE edema  Respiratory: CTA, no w/r/r MSK: diffuse swelling left 3rd digit, pain with active and passive flexion that extends into palm and unable to completely flex finger, no popping or pain with extension. Finger NTTP, No pain with metacarpal or carpal squeeze.  Neuro: normal affect, a&ox3 Skin: c/d/i    Vitals:   03/21/21 0923  BP: (!) 143/75  Pulse: 72  Temp: 97.9 F (36.6 C)  TempSrc: Oral  SpO2: 99%  Weight: 215 lb (97.5 kg)    Assessment & Plan:   See Encounters Tab for problem based charting.  Patient discussed with Dr. Oswaldo Done

## 2021-03-21 NOTE — Patient Instructions (Addendum)
Thank you for allowing [place] to provide your care today. Today we discussed your diabetes and autoimmune disease.   I have ordered the following labs for you:  CBC, BMP, SSA   I will call if any are abnormal.    Today we made the following changes to your medications:   Please START taking  Naproxen 500 mg bid for two weeks.   PLEASE MAKE SURE YOU TAKE YOUR INSULIN AND VICTOZA. This will help your symptoms.   Please STOP taking   Ibuprofen   I will do an online consultation with rheumatology and call you with recommendations.   Otherwise please follow-up in three months or sooner if symptoms fail to improve.   Please call the internal medicine center clinic if you have any questions or concerns, we may be able to help and keep you from a long and expensive emergency room wait. Our clinic and after hours phone number is [phone], the best time to call is Monday through Friday 9 am to 4 pm but there is always someone available 24/7 if you have an emergency. If you need medication refills please notify your pharmacy one week in advance and they will send [place] a request.

## 2021-03-22 LAB — CBC WITH DIFFERENTIAL/PLATELET
Basophils Absolute: 0 10*3/uL (ref 0.0–0.2)
Basos: 1 %
EOS (ABSOLUTE): 0.1 10*3/uL (ref 0.0–0.4)
Eos: 1 %
Hematocrit: 46.8 % — ABNORMAL HIGH (ref 34.0–46.6)
Hemoglobin: 15.2 g/dL (ref 11.1–15.9)
Immature Grans (Abs): 0 10*3/uL (ref 0.0–0.1)
Immature Granulocytes: 0 %
Lymphocytes Absolute: 1.8 10*3/uL (ref 0.7–3.1)
Lymphs: 32 %
MCH: 29.3 pg (ref 26.6–33.0)
MCHC: 32.5 g/dL (ref 31.5–35.7)
MCV: 90 fL (ref 79–97)
Monocytes Absolute: 0.4 10*3/uL (ref 0.1–0.9)
Monocytes: 7 %
Neutrophils Absolute: 3.4 10*3/uL (ref 1.4–7.0)
Neutrophils: 59 %
Platelets: 296 10*3/uL (ref 150–450)
RBC: 5.18 x10E6/uL (ref 3.77–5.28)
RDW: 11.9 % (ref 11.7–15.4)
WBC: 5.7 10*3/uL (ref 3.4–10.8)

## 2021-03-22 LAB — BMP8+ANION GAP
Anion Gap: 15 mmol/L (ref 10.0–18.0)
BUN/Creatinine Ratio: 12 (ref 9–23)
BUN: 8 mg/dL (ref 6–24)
CO2: 25 mmol/L (ref 20–29)
Calcium: 9.7 mg/dL (ref 8.7–10.2)
Chloride: 96 mmol/L (ref 96–106)
Creatinine, Ser: 0.68 mg/dL (ref 0.57–1.00)
Glucose: 429 mg/dL — ABNORMAL HIGH (ref 65–99)
Potassium: 4.8 mmol/L (ref 3.5–5.2)
Sodium: 136 mmol/L (ref 134–144)
eGFR: 103 mL/min/{1.73_m2} (ref >59)

## 2021-03-22 LAB — URINALYSIS, ROUTINE W REFLEX MICROSCOPIC
Bilirubin, UA: NEGATIVE
Ketones, UA: NEGATIVE
Nitrite, UA: NEGATIVE
Protein,UA: NEGATIVE
RBC, UA: NEGATIVE
Specific Gravity, UA: >=1.03 — AB (ref 1.005–1.030)
Urobilinogen, Ur: 0.2 mg/dL (ref 0.2–1.0)
pH, UA: 7 (ref 5.0–7.5)

## 2021-03-22 LAB — SJOGREN'S SYNDROME ANTIBODS(SSA + SSB)
ENA SSA (RO) Ab: <0.2 AI (ref 0.0–0.9)
ENA SSB (LA) Ab: <0.2 AI (ref 0.0–0.9)

## 2021-03-22 LAB — MICROSCOPIC EXAMINATION
Casts: NONE SEEN /lpf
Epithelial Cells (non renal): >10 /hpf — AB (ref 0–10)
RBC, Urine: NONE SEEN /hpf (ref 0–2)

## 2021-03-22 LAB — MICROALBUMIN / CREATININE URINE RATIO
Creatinine, Urine: 27.5 mg/dL
Microalb/Creat Ratio: <11 mg/g creat (ref 0–29)
Microalbumin, Urine: <3 ug/mL

## 2021-03-22 NOTE — Progress Notes (Signed)
Internal Medicine Clinic Attending  Case discussed with Dr. Cleaster Corin  At the time of the visit.  We reviewed the resident's history and exam and pertinent patient test results.  I agree with the assessment, diagnosis, and plan of care documented in the resident's note.   Person with uncontrolled diabetes here with months-long recurring swelling and pain on the left third digit that is becoming function limiting. We considered an inflammatory arthritis, auto-antibody evaluation only positive for a moderately elevated ANA at 1:360, which may or may-not be related. At this point, because this arthritis seems to only be affecting the left third PIP, and no other signs of SLE are seen after these months, likely this is osteoarthritis of the PIP. We are going to treat with topical NSAIDs in the long term. Steroid injection into the PIP joint can be considered in the future, but I would suggest better control of her diabetes first,

## 2021-03-26 NOTE — Addendum Note (Signed)
Addended by: Guinevere Scarlet A on: 03/26/2021 10:22 AM   Modules accepted: Orders

## 2021-03-30 ENCOUNTER — Other Ambulatory Visit: Payer: Self-pay

## 2021-03-30 DIAGNOSIS — N6310 Unspecified lump in the right breast, unspecified quadrant: Secondary | ICD-10-CM

## 2021-04-09 ENCOUNTER — Encounter (HOSPITAL_COMMUNITY): Payer: Self-pay

## 2021-04-09 ENCOUNTER — Other Ambulatory Visit: Payer: Self-pay

## 2021-04-09 ENCOUNTER — Ambulatory Visit (HOSPITAL_COMMUNITY)
Admission: RE | Admit: 2021-04-09 | Discharge: 2021-04-09 | Disposition: A | Discharge status: Home / Self Care | Payer: Self-pay | Source: Ambulatory Visit | Attending: Student in an Organized Health Care Education/Training Program | Admitting: Student in an Organized Health Care Education/Training Program

## 2021-04-09 DIAGNOSIS — R911 Solitary pulmonary nodule: Secondary | ICD-10-CM | POA: Insufficient documentation

## 2021-04-18 ENCOUNTER — Other Ambulatory Visit (HOSPITAL_COMMUNITY): Payer: Self-pay

## 2021-04-18 ENCOUNTER — Ambulatory Visit: Payer: Self-pay | Admitting: Internal Medicine

## 2021-04-18 VITALS — BP 140/65 | HR 80 | Wt 221.1 lb

## 2021-04-18 DIAGNOSIS — M779 Enthesopathy, unspecified: Secondary | ICD-10-CM

## 2021-04-18 DIAGNOSIS — M79645 Pain in left finger(s): Secondary | ICD-10-CM

## 2021-04-18 MED ORDER — MELOXICAM 7.5 MG PO TABS
7.5000 mg | ORAL_TABLET | Freq: Every day | ORAL | 0 refills | Status: AC
  Filled 2021-04-18: qty 14, 14d supply, fill #0

## 2021-04-18 NOTE — Patient Instructions (Signed)
Thank you, Ms.Shalay P Bastin for allowing [place] to provide your care today. Today we discussed hand pain.    I have ordered the following labs for you:  Lab Orders  No laboratory test(s) ordered today     Tests ordered today:  none  Referrals ordered today:   Referral Orders  No referral(s) requested today     Medication Changes:   There are no discontinued medications.   Meds ordered this encounter  Medications  . meloxicam (MOBIC) 7.5 MG tablet    Sig: Take 1 tablet (7.5 mg total) by mouth daily for 14 days.    Dispense:  14 tablet    Refill:  0    IM program     Instructions:  - Please try to limit lifting more than 2-5 lbs with your hand - Use ice or heat daily - try using voltaren gel up to 4 times daily - Please use the Mobic for pain relief. - I will write oyu a note for work  Follow up: in the next week or so for a steroid shot.   Remember:    Should you have any questions or concerns please call the internal medicine clinic at [phone].     Dellia Cloud, D.O. [facility]

## 2021-04-19 ENCOUNTER — Encounter: Payer: Self-pay | Admitting: Internal Medicine

## 2021-04-19 NOTE — Assessment & Plan Note (Addendum)
Patient presents for reevaluation of finger pain.  Patient states that she continues to have pain in that finger particularly with activity.  She states that the pain is exacerbated at work while she has to use it.  She works as a Glass blower/designer at [employer].  She denies any clicking or locking of the joint.  She denies any true arthralgias or swelling of the joints of her middle finger.  She has no other arthralgias or myalgias throughout her body.  She denies any recent fevers chills or rashes.  Patient does have pain when flexing the finger against resistance.  I can palpate pain along the flexor tendon.  Assessment: Flexor tendonitis    Plan: 1. Counseled patient regarding activity modification including decreased lifting with that hand 2. Prescribe another short course of Mobic 3. Encourage warm/cold compresses as needed and gentle stretching 4. Gave patient a work note to describe her physical limitations 5. Counseled her to schedule follow-up appointment in our clinic for steroid injection and that helped her in the past

## 2021-04-19 NOTE — Progress Notes (Signed)
CC: hand pain  HPI:  Amy Norris is a 55 y.o. female with a past medical history stated below and presents today for hand pain. Please see problem based assessment and plan for additional details.  Past Medical History:  Diagnosis Date  . Anal fissure   . Breast mass   . Bronchitis   . Carpal tunnel syndrome, bilateral   . Cholelithiasis   . Depression   . Diabetes mellitus    Type II  . Essential hypertension   . GERD (gastroesophageal reflux disease)   . Herpes simplex type 1 infection    vaginal  . Hypokalemia    2/2 diarrhea  . Menorrhagia   . Obesity   . Polycystic ovarian disease   . Renal calculi   . Renal calculus   . Sleep apnea    no cpap  . Sleep apnea, obstructive     Current Outpatient Medications on File Prior to Visit  Medication Sig Dispense Refill  . atorvastatin (LIPITOR) 40 MG tablet TAKE 1 TABLET (40 MG TOTAL) BY MOUTH DAILY. 30 tablet 5  . benzonatate (TESSALON) 100 MG capsule Take 1 capsule by mouth three times daily as needed for cough 30 capsule 0  . colestipol (COLESTID) 1 g tablet Take 2 tablets (2 g total) by mouth 2 (two) times daily. 120 tablet 2  . diclofenac Sodium (VOLTAREN) 1 % GEL Apply 4 g topically 4 (four) times daily. 100 g 1  . Elastic Bandages & Supports (CURVED FINGER SPLINT) MISC 1 Product by Does not apply route daily. 1 each 0  . gabapentin (NEURONTIN) 300 MG capsule TAKE 1 CAPSULE (300 MG TOTAL) BY MOUTH 3 (THREE) TIMES DAILY. 90 capsule 1  . glucose blood test strip USE TO TEST BLOOD GLUCOSE UP TO 4 TIMES PER DAY. 200 strip 12  . Insulin Lispro Prot & Lispro (HUMALOG 75/25 MIX) (75-25) 100 UNIT/ML Kwikpen INJECT 50 UNITS UNDER THE SKIN DAILY WITH BREAKFAST AND 45 UNITS WITH DINNER DAILY 15 mL 1  . Insulin Pen Needle 32G X 6 MM MISC Please use to inject insulin five times daily. ICD 10: E11.65 490 each 11  . liraglutide (VICTOZA) 18 MG/3ML SOPN INJECT 1.8 MG TOTAL INTO THE SKIN DAILY. 9 mL 5  . lisinopril (ZESTRIL)  10 MG tablet TAKE 1 TABLET (10 MG TOTAL) BY MOUTH DAILY. 30 tablet 5  . Microlet Lancets MISC Use to check blood glucose up to 4 times per day. 200 each 12  . naproxen (NAPROSYN) 500 MG tablet TAKE 1 TABLET (500 MG TOTAL) BY MOUTH 2 (TWO) TIMES DAILY WITH A MEAL FOR 14 DAYS. 28 tablet 0  . ondansetron (ZOFRAN) 4 MG tablet TAKE 1 TABLET (4 MG TOTAL) BY MOUTH DAILY AS NEEDED FOR NAUSEA OR VOMITING. 30 tablet 1  . predniSONE (DELTASONE) 20 MG tablet TAKE 3 TABS BY MOUTH WITH BREAKFAST FOR 3 DAYS, 2 TABS FOR 3 DAYS, 1 TAB FOR 3 DAYS THEN 1/2 TAB FOR 2 DAYS 19 tablet 0  . UNIFINE PENTIPS 31G X 6 MM MISC USE AS DIRECTED WITH VICTOZA 100 each 0   No current facility-administered medications on file prior to visit.    Family History  Problem Relation Age of Onset  . Hypertension Mother   . Diabetes Father   . Colon cancer Neg Hx   . Esophageal cancer Neg Hx   . Stomach cancer Neg Hx   . Rectal cancer Neg Hx     Social History  Socioeconomic History  . Marital status: Married    Spouse name: Not on file  . Number of children: Not on file  . Years of education: Not on file  . Highest education level: Not on file  Occupational History  . Occupation: Deli    Employer: [employer]  Tobacco Use  . Smoking status: Former Smoker    Types: Cigarettes    Quit date: 12/30/1978    Years since quitting: 42.3  . Smokeless tobacco: Never Used  Vaping Use  . Vaping Use: Never used  Substance and Sexual Activity  . Alcohol use: No    Alcohol/week: 0.0 standard drinks  . Drug use: No  . Sexual activity: Never  Other Topics Concern  . Not on file  Social History Narrative    Smoked many years ago drinks 1-2 beers on Friday and Saturdays no illegal drug use. Married and lives with her husband and sister-in-law son daughter and grandson.   Financial assistance approved for 100% discount at [facility] and has [facility] card per Rudell Cobb as of August 06, 2010 6:27 PM   Social Determinants of Health    Financial Resource Strain: Not on file  Food Insecurity: Not on file  Transportation Needs: Not on file  Physical Activity: Not on file  Stress: Not on file  Social Connections: Not on file  Intimate Partner Violence: Not on file    Review of Systems: ROS negative except for what is noted on the assessment and plan.  Vitals:   04/18/21 1513  BP: 140/65  Pulse: 80  SpO2: 99%  Weight: 221 lb 1.6 oz (100.3 kg)    Physical Exam: Gen: A&O x3 and in no apparent distress, well appearing and nourished. HEENT: Head - normocephalic, atraumatic. Eye -  visual acuity grossly intact, conjunctiva clear, sclera non-icteric, EOM intact. Mouth - No obvious caries or periodontal disease. CV: RRR, no murmurs, rubs, or gallops. S1/S2 presents  Resp: Clear to ascultation bilaterally  MSK: TTP to the left 3rd digit at the palmar aspect at the [facility] PIP along the flexor tendon. Mild swelling of the finger. No warmth or erythema. No arthralgias present. Full ROM but flexion slightly limited by pain. Pain with resistance in flexion of the 3rd digit on left hand.  Skin: good skin turgor, no rashes, unusual bruising, or prominent lesions.  Neuro: No focal deficits, grossly normal sensation and coordination.  Psych: Oriented x3 and responding appropriately. Intact recent and remote memory, normal mood, judgement, affect , and insight.    Assessment & Plan:   See Encounters Tab for problem based charting.  Patient discussed with Dr. Sarina Ill, D.O. [facility] Internal [facility], PGY-2 Pager: [phone], Phone: [phone] Date 04/19/2021 Time 2:48 PM

## 2021-04-20 NOTE — Progress Notes (Signed)
Internal Medicine Clinic Attending  I saw and evaluated the patient.  I personally confirmed the key portions of the history and exam documented by Dr. Marchia Bond and I reviewed pertinent patient test results.  The assessment, diagnosis, and plan were formulated together and I agree with the documentation in the resident's note.  Previous diagnosis of dactylitis is not consistent at this time.  Tenderness on palpation is limited to the 3rd finger flexor tendon overlying and proximal to MCP.  Occasional locking suggests predisposition to trigger finger.  Injection helped significantly at last visit.  She would benefit from repeat treatment.  Overuse due to her job has prevented healing.

## 2021-04-25 ENCOUNTER — Encounter: Payer: Self-pay | Admitting: Internal Medicine

## 2021-04-25 ENCOUNTER — Ambulatory Visit (INDEPENDENT_AMBULATORY_CARE_PROVIDER_SITE_OTHER): Payer: Self-pay | Admitting: Internal Medicine

## 2021-04-25 ENCOUNTER — Other Ambulatory Visit: Payer: Self-pay

## 2021-04-25 DIAGNOSIS — M65332 Trigger finger, left middle finger: Secondary | ICD-10-CM

## 2021-04-25 NOTE — Patient Instructions (Signed)
Thank you, [name] for allowing [place] to provide your care today. Today we discussed trigger finger.    I have ordered the following labs for you:  Lab Orders  No laboratory test(s) ordered today     Tests ordered today:  Tigger finger injection.  Referrals ordered today:   Referral Orders  No referral(s) requested today     Medication Changes:   There are no discontinued medications.   No orders of the defined types were placed in this encounter.    Instructions:   Follow up: as needed   Remember:    Should you have any questions or concerns please call the internal medicine clinic at [phone].     Dellia Cloud, D.O. [facility]

## 2021-04-26 ENCOUNTER — Ambulatory Visit
Admission: RE | Admit: 2021-04-26 | Discharge: 2021-04-26 | Disposition: A | Discharge status: Home / Self Care | Payer: No Typology Code available for payment source | Source: Ambulatory Visit | Attending: Obstetrics and Gynecology | Admitting: Obstetrics and Gynecology

## 2021-04-26 ENCOUNTER — Ambulatory Visit: Payer: Self-pay | Admitting: *Deleted

## 2021-04-26 VITALS — BP 132/82 | Wt 222.9 lb

## 2021-04-26 DIAGNOSIS — N6315 Unspecified lump in the right breast, overlapping quadrants: Secondary | ICD-10-CM

## 2021-04-26 DIAGNOSIS — N6310 Unspecified lump in the right breast, unspecified quadrant: Secondary | ICD-10-CM

## 2021-04-26 DIAGNOSIS — Z1239 Encounter for other screening for malignant neoplasm of breast: Secondary | ICD-10-CM

## 2021-04-26 NOTE — Progress Notes (Signed)
Amy Norris is a 55 y.o. female who presents to [facility] clinic today with complaint of pea sized right breast lump x 4 months.    Pap Smear: Pap smear not completed today. Last Pap smear was 10/07/2016 at [facility] and was normal. Per patient has no history of an abnormal Pap smear. Last Pap smear result is available in Epic.   Physical exam: Breasts Breasts symmetrical. No skin abnormalities bilateral breasts. No nipple retraction bilateral breasts. No nipple discharge bilateral breasts. No lymphadenopathy. No lumps palpated left breast. Palpated a bb sized lump within the right breast at 3 o'clock 10 cm from the nipple. No complaints of pain or tenderness on exam.  MS DIGITAL SCREENING BILATERAL  Result Date: 08/04/2017 CLINICAL DATA:  Screening. EXAM: DIGITAL SCREENING BILATERAL MAMMOGRAM WITH CAD COMPARISON:  Previous exam(s). ACR Breast Density Category b: There are scattered areas of fibroglandular density. FINDINGS: There are no findings suspicious for malignancy. Images were processed with CAD. IMPRESSION: No mammographic evidence of malignancy. A result letter of this screening mammogram will be mailed directly to the patient. RECOMMENDATION: Screening mammogram in one year. (Code:[id number]-B-01Y) BI-RADS CATEGORY  1: Negative. Electronically Signed   By: Annia Belt M.D.   On: 08/04/2017 07:20   Pelvic/Bimanual Patient refused Pap smear today.    Smoking History: Patient is a former smoker that quit in 1980.   Patient Navigation: Patient education provided. Access to services provided for patient through [facility] program.   Colorectal Cancer Screening: Per patient has had colonoscopy completed on 02/29/2020 at [facility]. No complaints today.    Breast and Cervical Cancer Risk Assessment: Patient has family history of her mother and a maternal aunt having breast cancer. Patient has no known genetic mutations or history of radiation treatment to the chest before  age 10. Patient does not have history of cervical dysplasia, immunocompromised, or DES exposure in-utero.  Risk Assessment    Risk Scores      04/26/2021   Last edited by: Meryl Dare, CMA   5-year risk: 2.2 %   Lifetime risk: 15.4 %         A: [facility] exam without pap smear Complaint of right breast lump.  P: Referred patient to the [facility] for a diagnostic mammogram. Appointment scheduled Thursday, April 26, 2021 at 1400.  Priscille Heidelberg, RN 04/26/2021 1:01 PM

## 2021-04-26 NOTE — Patient Instructions (Signed)
Explained breast self awareness with [facility]. Patient refused Pap smear today. Explained to patient that she is due for a Pap smear and that the Pap smear is covered through [employer]. Let her know [facility] will cover Pap smears every 3 years unless has a history of abnormal Pap smears. Referred patient to the [facility] for a diagnostic mammogram. Appointment scheduled Thursday, April 26, 2021 at 1400. Patient aware of appointment and will be there. Anely P Sturdevant verbalized understanding.  Avrey Hyser, Kathaleen Maser, RN 1:01 PM

## 2021-04-28 ENCOUNTER — Encounter: Payer: Self-pay | Admitting: Internal Medicine

## 2021-04-28 NOTE — Assessment & Plan Note (Signed)
Presents for reevaluation and management of her middle left trigger finger. She continues to have pain with motion and sensation of finger locking/clicking. The patient agreed to a steroid injection. Risk and benefits were discussed and consent was signed.   Steroid injection was performed today as follows. The palmar aspect of her hand was prepped with alcohol cleanser. Topical cooling spray was used to numb the surrounding area. Kenalog was diluted with 1% lidocaine without epi and administered via a 25 gauge needle. No blood loss and no complications occurred. Patient tolerated the procedure well. She expressed some improvement of her pain and range of motion after injection.  Plan: - Continue conservative therapy with activity modifications, and OTC pain medicine.

## 2021-04-28 NOTE — Progress Notes (Signed)
CC: Trigger finger  HPI:  Amy Norris is a 55 y.o. female with a past medical history stated below and presents today for trigger finger. Please see problem based assessment and plan for additional details.  Past Medical History:  Diagnosis Date  . Anal fissure   . Breast mass   . Bronchitis   . Carpal tunnel syndrome, bilateral   . Cholelithiasis   . Depression   . Diabetes mellitus    Type II  . Essential hypertension   . GERD (gastroesophageal reflux disease)   . Herpes simplex type 1 infection    vaginal  . Hypokalemia    2/2 diarrhea  . Menorrhagia   . Obesity   . Polycystic ovarian disease   . Renal calculi   . Renal calculus   . Sleep apnea    no cpap  . Sleep apnea, obstructive     Current Outpatient Medications on File Prior to Visit  Medication Sig Dispense Refill  . atorvastatin (LIPITOR) 40 MG tablet TAKE 1 TABLET (40 MG TOTAL) BY MOUTH DAILY. (Patient not taking: Reported on 04/26/2021) 30 tablet 5  . benzonatate (TESSALON) 100 MG capsule Take 1 capsule by mouth three times daily as needed for cough (Patient not taking: Reported on 04/26/2021) 30 capsule 0  . colestipol (COLESTID) 1 g tablet Take 2 tablets (2 g total) by mouth 2 (two) times daily. (Patient not taking: Reported on 04/26/2021) 120 tablet 2  . diclofenac Sodium (VOLTAREN) 1 % GEL Apply 4 g topically 4 (four) times daily. (Patient not taking: Reported on 04/26/2021) 100 g 1  . Elastic Bandages & Supports (CURVED FINGER SPLINT) MISC 1 Product by Does not apply route daily. 1 each 0  . gabapentin (NEURONTIN) 300 MG capsule TAKE 1 CAPSULE (300 MG TOTAL) BY MOUTH 3 (THREE) TIMES DAILY. 90 capsule 1  . glucose blood test strip USE TO TEST BLOOD GLUCOSE UP TO 4 TIMES PER DAY. (Patient taking differently: USE TO TEST BLOOD GLUCOSE UP TO 4 TIMES PER DAY.) 200 strip 12  . Insulin Lispro Prot & Lispro (HUMALOG 75/25 MIX) (75-25) 100 UNIT/ML Kwikpen INJECT 50 UNITS UNDER THE SKIN DAILY WITH BREAKFAST  AND 45 UNITS WITH DINNER DAILY 15 mL 1  . Insulin Pen Needle 32G X 6 MM MISC Please use to inject insulin five times daily. ICD 10: E11.65 490 each 11  . liraglutide (VICTOZA) 18 MG/3ML SOPN INJECT 1.8 MG TOTAL INTO THE SKIN DAILY. 9 mL 5  . lisinopril (ZESTRIL) 10 MG tablet TAKE 1 TABLET (10 MG TOTAL) BY MOUTH DAILY. (Patient not taking: Reported on 04/26/2021) 30 tablet 5  . meloxicam (MOBIC) 7.5 MG tablet Take 1 tablet (7.5 mg total) by mouth daily for 14 days. (Patient not taking: Reported on 04/26/2021) 14 tablet 0  . Microlet Lancets MISC Use to check blood glucose up to 4 times per day. 200 each 12  . naproxen (NAPROSYN) 500 MG tablet TAKE 1 TABLET (500 MG TOTAL) BY MOUTH 2 (TWO) TIMES DAILY WITH A MEAL FOR 14 DAYS. (Patient not taking: Reported on 04/26/2021) 28 tablet 0  . ondansetron (ZOFRAN) 4 MG tablet TAKE 1 TABLET (4 MG TOTAL) BY MOUTH DAILY AS NEEDED FOR NAUSEA OR VOMITING. (Patient not taking: Reported on 04/26/2021) 30 tablet 1  . predniSONE (DELTASONE) 20 MG tablet TAKE 3 TABS BY MOUTH WITH BREAKFAST FOR 3 DAYS, 2 TABS FOR 3 DAYS, 1 TAB FOR 3 DAYS THEN 1/2 TAB FOR 2 DAYS (Patient not taking:  Reported on 04/26/2021) 19 tablet 0  . UNIFINE PENTIPS 31G X 6 MM MISC USE AS DIRECTED WITH VICTOZA (Patient not taking: Reported on 04/26/2021) 100 each 0   No current facility-administered medications on file prior to visit.    Family History  Problem Relation Age of Onset  . Hypertension Mother   . Breast cancer Mother   . Diabetes Father   . Breast cancer Maternal Aunt   . Colon cancer Neg Hx   . Esophageal cancer Neg Hx   . Stomach cancer Neg Hx   . Rectal cancer Neg Hx     Social History   Socioeconomic History  . Marital status: Married    Spouse name: Not on file  . Number of children: Not on file  . Years of education: Not on file  . Highest education level: Not on file  Occupational History  . Occupation: Deli    Employer: [employer]  Tobacco Use  . Smoking status:  Former Smoker    Types: Cigarettes    Quit date: 12/30/1978    Years since quitting: 42.3  . Smokeless tobacco: Never Used  Vaping Use  . Vaping Use: Never used  Substance and Sexual Activity  . Alcohol use: Yes    Alcohol/week: 0.0 standard drinks    Comment: occ.  . Drug use: No  . Sexual activity: Not Currently    Birth control/protection: None  Other Topics Concern  . Not on file  Social History Narrative    Smoked many years ago drinks 1-2 beers on Friday and Saturdays no illegal drug use. Married and lives with her husband and sister-in-law son daughter and grandson.   Financial assistance approved for 100% discount at [facility] and has [facility] card per Rudell Cobb as of August 06, 2010 6:27 PM   Social Determinants of Health   Financial Resource Strain: Not on file  Food Insecurity: Not on file  Transportation Needs: No Transportation Needs  . Lack of Transportation (Medical): No  . Lack of Transportation (Non-Medical): No  Physical Activity: Not on file  Stress: Not on file  Social Connections: Not on file  Intimate Partner Violence: Not on file    Review of Systems: ROS negative except for what is noted on the assessment and plan.  Vitals:   04/25/21 1328  BP: (!) 148/89  Pulse: 81  Temp: 98.1 F (36.7 C)  TempSrc: Oral  SpO2: 97%  Weight: 225 lb 12.8 oz (102.4 kg)  Height: 5\' 8"  (1.727 m)    Physical Exam: Gen: A&O x3 and in no apparent distress, well appearing and nourished. HEENT: Head - normocephalic, atraumatic. Eye -  visual acuity grossly intact, conjunctiva clear, sclera non-icteric, EOM intact. Mouth - No obvious caries or periodontal disease. Neck: no obvious masses or nodules, AROM intact. CV: RRR, no murmurs, rubs, or gallops. S1/S2 presents  Resp: Clear to ascultation bilaterally  Abd: BS (+) x4, soft, non-tender, without obvious hepatosplenomegaly or masses MSK: finger with pain on the flexor aspect of middle left finger. Pain and clicking sensation  with flexion. No masses palpated.  Skin: good skin turgor, no rashes, unusual bruising, or prominent lesions.  Neuro: No focal deficits, grossly normal sensation and coordination.      Assessment & Plan:   See Encounters Tab for problem based charting.  Patient discussed with Dr. , D.O. [facility] Internal Medicine, PGY-2 Pager: [phone], Phone: [phone] Date 04/28/2021 Time 11:48 AM

## 2021-04-30 NOTE — Progress Notes (Signed)
Internal Medicine Clinic Attending  I saw and evaluated the patient.  I personally confirmed the key portions of the history and exam documented by Dr. Marchia Bond and I reviewed pertinent patient test results.  The assessment, diagnosis, and plan were formulated together and I agree with the documentation in the resident's note.  I was present for the entire procedure.

## 2021-05-08 ENCOUNTER — Other Ambulatory Visit: Payer: Self-pay | Admitting: Internal Medicine

## 2021-05-08 DIAGNOSIS — J069 Acute upper respiratory infection, unspecified: Secondary | ICD-10-CM

## 2021-06-03 ENCOUNTER — Encounter: Payer: Self-pay | Admitting: *Deleted

## 2021-06-07 MED FILL — Gabapentin Cap 300 MG: ORAL | 30 days supply | Qty: 90 | Fill #0 | Status: AC

## 2021-06-08 ENCOUNTER — Other Ambulatory Visit (HOSPITAL_COMMUNITY): Payer: Self-pay

## 2021-06-15 ENCOUNTER — Encounter: Payer: Self-pay | Admitting: Internal Medicine

## 2021-06-15 ENCOUNTER — Other Ambulatory Visit (HOSPITAL_COMMUNITY): Payer: Self-pay

## 2021-06-15 ENCOUNTER — Encounter: Payer: Self-pay | Admitting: Student

## 2021-06-15 ENCOUNTER — Other Ambulatory Visit: Payer: Self-pay

## 2021-06-15 ENCOUNTER — Other Ambulatory Visit: Payer: Self-pay | Admitting: Internal Medicine

## 2021-06-15 DIAGNOSIS — E1142 Type 2 diabetes mellitus with diabetic polyneuropathy: Secondary | ICD-10-CM

## 2021-06-15 MED FILL — Atorvastatin Calcium Tab 40 MG (Base Equivalent): ORAL | 30 days supply | Qty: 30 | Fill #0 | Status: AC

## 2021-06-15 MED FILL — Liraglutide Soln Pen-injector 18 MG/3ML (6 MG/ML): SUBCUTANEOUS | 30 days supply | Qty: 9 | Fill #0 | Status: AC

## 2021-06-15 MED FILL — Lisinopril Tab 10 MG: ORAL | 30 days supply | Qty: 30 | Fill #0 | Status: AC

## 2021-06-15 MED FILL — Insulin Lispro Prot & Lispro Sus Pen-inj 100 Unit/ML (75-25): SUBCUTANEOUS | 16 days supply | Qty: 15 | Fill #0 | Status: AC

## 2021-06-16 ENCOUNTER — Other Ambulatory Visit: Payer: Self-pay | Admitting: Internal Medicine

## 2021-06-16 DIAGNOSIS — E1142 Type 2 diabetes mellitus with diabetic polyneuropathy: Secondary | ICD-10-CM

## 2021-06-16 MED ORDER — GLUCOSE BLOOD VI STRP
ORAL_STRIP | 12 refills | Status: DC
  Filled 2021-06-16 – 2021-07-17 (×2): qty 100, 25d supply, fill #0
  Filled 2021-09-22: qty 100, 25d supply, fill #1
  Filled 2022-01-07: qty 100, 25d supply, fill #2

## 2021-06-18 ENCOUNTER — Other Ambulatory Visit (HOSPITAL_COMMUNITY): Payer: Self-pay

## 2021-06-26 ENCOUNTER — Other Ambulatory Visit (HOSPITAL_COMMUNITY): Payer: Self-pay

## 2021-07-03 ENCOUNTER — Encounter: Payer: Self-pay | Admitting: *Deleted

## 2021-07-17 ENCOUNTER — Other Ambulatory Visit: Payer: Self-pay

## 2021-07-17 ENCOUNTER — Other Ambulatory Visit: Payer: Self-pay | Admitting: Student in an Organized Health Care Education/Training Program

## 2021-07-17 ENCOUNTER — Other Ambulatory Visit: Payer: Self-pay | Admitting: Student

## 2021-07-17 DIAGNOSIS — R1111 Vomiting without nausea: Secondary | ICD-10-CM

## 2021-07-17 MED FILL — Lisinopril Tab 10 MG: ORAL | 30 days supply | Qty: 30 | Fill #1 | Status: AC

## 2021-07-17 MED FILL — Liraglutide Soln Pen-injector 18 MG/3ML (6 MG/ML): SUBCUTANEOUS | 30 days supply | Qty: 9 | Fill #1 | Status: AC

## 2021-07-18 ENCOUNTER — Other Ambulatory Visit: Payer: Self-pay | Admitting: Student

## 2021-07-18 ENCOUNTER — Other Ambulatory Visit: Payer: Self-pay | Admitting: Internal Medicine

## 2021-07-18 ENCOUNTER — Other Ambulatory Visit (HOSPITAL_COMMUNITY): Payer: Self-pay

## 2021-07-18 ENCOUNTER — Other Ambulatory Visit: Payer: Self-pay | Admitting: Student in an Organized Health Care Education/Training Program

## 2021-07-18 DIAGNOSIS — M4690 Unspecified inflammatory spondylopathy, site unspecified: Secondary | ICD-10-CM

## 2021-07-18 DIAGNOSIS — E1142 Type 2 diabetes mellitus with diabetic polyneuropathy: Secondary | ICD-10-CM

## 2021-07-18 DIAGNOSIS — R1111 Vomiting without nausea: Secondary | ICD-10-CM

## 2021-07-18 MED ORDER — GABAPENTIN 300 MG PO CAPS
300.0000 mg | ORAL_CAPSULE | Freq: Three times a day (TID) | ORAL | 1 refills | Status: DC
Start: 2021-07-18 — End: 2022-01-07
  Filled 2021-07-18: qty 90, 30d supply, fill #0
  Filled 2021-07-18: qty 90, fill #0
  Filled 2021-09-22: qty 90, 30d supply, fill #1

## 2021-07-18 MED ORDER — ONDANSETRON HCL 4 MG PO TABS
4.0000 mg | ORAL_TABLET | Freq: Every day | ORAL | 1 refills | Status: DC | PRN
Start: 2021-07-18 — End: 2021-07-18
  Filled 2021-07-18: qty 30, 30d supply, fill #0

## 2021-07-18 MED ORDER — ONDANSETRON HCL 4 MG PO TABS
4.0000 mg | ORAL_TABLET | Freq: Every day | ORAL | 1 refills | Status: DC | PRN
  Filled 2021-07-18: qty 30, 30d supply, fill #0
  Filled 2021-09-22: qty 30, 30d supply, fill #1

## 2021-07-18 MED ORDER — NAPROXEN 500 MG PO TABS
500.0000 mg | ORAL_TABLET | Freq: Two times a day (BID) | ORAL | 0 refills | Status: AC
  Filled 2021-07-18: qty 28, fill #0
  Filled 2021-07-18: qty 28, 14d supply, fill #0

## 2021-07-18 MED ORDER — INSULIN LISPRO PROT & LISPRO (75-25 MIX) 100 UNIT/ML KWIKPEN
PEN_INJECTOR | SUBCUTANEOUS | 1 refills | Status: DC
Start: 2021-07-18 — End: 2022-01-07
  Filled 2021-07-18: qty 15, fill #0
  Filled 2021-07-18: qty 15, 16d supply, fill #0
  Filled 2021-09-22: qty 15, 16d supply, fill #1

## 2021-07-19 ENCOUNTER — Other Ambulatory Visit (HOSPITAL_COMMUNITY): Payer: Self-pay

## 2021-07-27 ENCOUNTER — Other Ambulatory Visit (HOSPITAL_COMMUNITY): Payer: Self-pay

## 2021-08-02 ENCOUNTER — Other Ambulatory Visit (HOSPITAL_COMMUNITY): Payer: Self-pay

## 2021-08-02 ENCOUNTER — Other Ambulatory Visit: Payer: Self-pay | Admitting: Internal Medicine

## 2021-08-03 ENCOUNTER — Other Ambulatory Visit (HOSPITAL_COMMUNITY): Payer: Self-pay

## 2021-08-03 MED ORDER — UNIFINE PENTIPS 31G X 6 MM MISC
0 refills | Status: DC
  Filled 2021-08-03 – 2021-09-22 (×2): qty 100, 30d supply, fill #0

## 2021-08-08 ENCOUNTER — Encounter: Payer: Self-pay | Admitting: Internal Medicine

## 2021-08-08 ENCOUNTER — Ambulatory Visit (INDEPENDENT_AMBULATORY_CARE_PROVIDER_SITE_OTHER): Payer: Self-pay | Admitting: Internal Medicine

## 2021-08-08 ENCOUNTER — Other Ambulatory Visit (HOSPITAL_COMMUNITY): Payer: Self-pay

## 2021-08-08 VITALS — BP 132/67 | HR 87 | Temp 98.7°F | Wt 226.6 lb

## 2021-08-08 DIAGNOSIS — I10 Essential (primary) hypertension: Secondary | ICD-10-CM

## 2021-08-08 DIAGNOSIS — E1142 Type 2 diabetes mellitus with diabetic polyneuropathy: Secondary | ICD-10-CM

## 2021-08-08 DIAGNOSIS — N6312 Unspecified lump in the right breast, upper inner quadrant: Secondary | ICD-10-CM

## 2021-08-08 DIAGNOSIS — E7800 Pure hypercholesterolemia, unspecified: Secondary | ICD-10-CM

## 2021-08-08 DIAGNOSIS — Z139 Encounter for screening, unspecified: Secondary | ICD-10-CM | POA: Insufficient documentation

## 2021-08-08 DIAGNOSIS — Z0000 Encounter for general adult medical examination without abnormal findings: Secondary | ICD-10-CM

## 2021-08-08 DIAGNOSIS — R911 Solitary pulmonary nodule: Secondary | ICD-10-CM

## 2021-08-08 LAB — POCT GLYCOSYLATED HEMOGLOBIN (HGB A1C): Hemoglobin A1C: 8.5 % — AB (ref 4.0–5.6)

## 2021-08-08 LAB — GLUCOSE, CAPILLARY: Glucose-Capillary: 299 mg/dL — ABNORMAL HIGH (ref 70–99)

## 2021-08-08 MED ORDER — ZOSTER VAC RECOMB ADJUVANTED 50 MCG/0.5ML IM SUSR: 0.5000 mL | Freq: Once | INTRAMUSCULAR | 0 refills | Status: DC

## 2021-08-08 MED ORDER — DAPAGLIFLOZIN PROPANEDIOL 5 MG PO TABS
5.0000 mg | ORAL_TABLET | Freq: Every day | ORAL | 3 refills | Status: DC
  Filled 2021-08-08: qty 30, 30d supply, fill #0
  Filled 2021-09-16: qty 30, 30d supply, fill #1
  Filled 2022-01-07: qty 30, 30d supply, fill #2
  Filled 2022-02-27: qty 30, 30d supply, fill #3

## 2021-08-08 MED ORDER — ZOSTER VAC RECOMB ADJUVANTED 50 MCG/0.5ML IM SUSR: 0.5000 mL | Freq: Once | INTRAMUSCULAR | 0 refills | Status: AC

## 2021-08-08 NOTE — Assessment & Plan Note (Signed)
Patient states that she has not been taking her lisinopril for the last few weeks.  When asked why she doesn't take this, she said she finds it hard to remember to take it.  Her blood pressure today was 132/67.  Asked patient if she has a blood pressure cuff at home and she said she does but does not take her BP often.  Encouraged her to start BP log and take BP once daily and to bring in log at next visit.  Assessment/ Plan: Patient has medication adherence with multiple meds.  Patient's BP is controlled off of her medications at this time.   -Will discontinue Lisinopril at this time. -Follow-up on BP log at next visit

## 2021-08-08 NOTE — Assessment & Plan Note (Addendum)
Medications:   Glucose-Capillary  Date/Time Value Ref Range Status  08/08/2021 02:30 PM 299 (H) 70 - 99 mg/dL Final    Comment:    Glucose reference range applies only to samples taken after fasting for at least 8 hours.    Last A1c was  Lab Results  Component Value Date   HGBA1C 8.5 (A) 08/08/2021  .   A/P: 1. Counseling: A. Patient presents with decreased A1c from 12 to 8.5.  Patient reports not using her insulin every day.  She thinks this is due to not remembering to take it.  She is willing to try setting an alarm to help her remember.   B. Weight loss of five to seven percent of body weight at a rate of 0.5 to 1.0 kg per week (1 to 2 lb per week). C. Moderate-intensity aerobic exercise for 30-60 minutes most days. 2. Medications: Continue Victoza 1.8 mg qd, Continue Humalog 45 units in the am and 50 units in the pm.  Her A1c goal is <7 and she is willing to try additional medications to help her reach this. Start Farxiga 5 mg qd through IM program, educated patient on potential side effects. If patient is able to tolerate this medication may consider increasing dosage depending on results of A1c in 3 months.   3. Goals:  A. patient does not take her insulin daily.  We discussed different things that might help her remember to take her insulin.  She will try using an alarm two times a day to help her remember. B. CBG - 100-120  C. Hgb A1c: <7 4. Follow-up: in 3 months for repeat a1c

## 2021-08-08 NOTE — Assessment & Plan Note (Signed)
Repeat CT 03/2021 showed- IMPRESSION: No change in 20.2 cm nodules in the lung bases bilaterally. No follow-up imaging is recommended. The exam is otherwise negative.

## 2021-08-08 NOTE — Assessment & Plan Note (Signed)
Patient requested Shingrix vaccination after seeing her husband go through shingles.  She has asked her pharmacy about the cost and this will most likely be a barrier for her due to no insurance.  Plan: Gave patient prescription order for shingrix vaccine in case she does decide to pursue vaccination

## 2021-08-08 NOTE — Assessment & Plan Note (Signed)
Patient present with neuropathy.  She notices the pain most at night when she is trying to fall asleep.  She takes gabapentin 300 mg BID with adequate relief.  Assessment/Plan: Continue Gabapentin 300 mg up to TID.  Educated patient that she can take an additional dose each day if the dose she has been taking becomes ineffective.

## 2021-08-08 NOTE — Assessment & Plan Note (Addendum)
Amy Norris presents with history of HLD.  She states that she has not been taking her Atorvastatin 40 mg for the last several months, she is unsure of the exact time off of medication.  Last lipid panel was in 2017 with total cholesterol of 175 and LDL=92.   Assessment/Plan: -repeat lipid panel  ADDENDUM Total= 242 LDL=148  ASCVD 3.9%  Called patient to review result.  She has not been taking Lipitor 40 mg for past several months.  Will discontinue at this time and plan to recheck lipid panel in one year.  ADDENDUM 08/14/21 In setting of patient's diabetes, she will be restarted on Lipitor 40 mg. Called and talked with patient to encourage her to take her Lipitor.  Sent in refill to outpatient pharmacy.  -recheck lipid panel in one year.

## 2021-08-08 NOTE — Progress Notes (Signed)
   CC: follow-up for diabetes and HLD  HPI:  Amy Norris is a 55 y.o. with medical history as below presenting to [facility] for follow-up for diabetes and HLD.  Please see problem-based list for further details, assessments, and plans.  Past Medical History:  Diagnosis Date   Anal fissure    Breast mass    Bronchitis    Carpal tunnel syndrome, bilateral    Cholelithiasis    Depression    Diabetes mellitus    Type II   Essential hypertension    GERD (gastroesophageal reflux disease)    Herpes simplex type 1 infection    vaginal   Hypokalemia    2/2 diarrhea   Menorrhagia    Obesity    Polycystic ovarian disease    Renal calculi    Renal calculus    Sleep apnea    no cpap   Sleep apnea, obstructive     Review of Systems:   Constitutional: no weight changes, no fatigue Gastrointestinal: no nausea, vomiting, constipation, or diarrhea Psychiatric/Behavioral: denies depression symptoms  Physical Exam:  Vitals:   08/08/21 1428  BP: 132/67  Pulse: 87  Temp: 98.7 F (37.1 C)  TempSrc: Oral  SpO2: 96%  Weight: 226 lb 9.6 oz (102.8 kg)    General: well-developed, well-nourished obese HENT: NCAT, no scars Eyes: no scleral icterus, conjunctiva clear CV: No murmurs, normal rate Pulm: CTAB, normal pulmonary effort GI: no tenderness, bowel sounds present MSK: diminished sensation in L4, L5, and S1, no swelling in lower extremities bilaterally Skin: warm and dry Psych: normal mood and affect  Assessment & Plan:   See Encounters Tab for problem based charting.  Patient seen with Dr. Antony Contras

## 2021-08-08 NOTE — Assessment & Plan Note (Addendum)
IMPRESSION: No mammographic or sonographic evidence of malignancy.  RECOMMENDATION: Treatment of the palpable lump felt by the patient's clinician should be based on clinical and physical exam given lack of imaging findings. Recommend annual screening mammography.  Plan:  Patient next mammography due 03/2022

## 2021-08-08 NOTE — Patient Instructions (Addendum)
Amy Norris, it was a pleasure seeing you today!  Today we discussed: Diabetes- A1c was decreased to 8.5 from 12.  Please use your alarm on your phone to try to help you remember to take your insulin.  We want to continue decreasing your A1c to <7. We are Farxiga  High cholesterol- we get some blood work today, I will call you with the results of that in a couple days   I have ordered the following labs today:   Lab Orders  Glucose, capillary  Lipid Profile  POC Hbg A1C    Tests ordered today:  Lipid panel, A1c  Referrals ordered today:   Referral Orders  No referral(s) requested today     I have ordered the following medication/changed the following medications:   Stop the following medications: Medications Discontinued During This Encounter  Medication Reason   lisinopril (ZESTRIL) 10 MG tablet Discontinued by provider   predniSONE (DELTASONE) 20 MG tablet Completed Course   Zoster Vaccine Adjuvanted [facility]) injection Discontinued by provider     Start the following medications: Meds ordered this encounter  Medications   DISCONTD: Zoster Vaccine Adjuvanted [facility]) injection    Sig: Inject 0.5 mLs into the muscle once for 1 dose.    Dispense:  0.5 mL    Refill:  0   Zoster Vaccine Adjuvanted [facility]) injection    Sig: Inject 0.5 mLs into the muscle once for 1 dose.    Dispense:  0.5 mL    Refill:  0     Follow-up: 3 months   Please make sure to arrive 15 minutes prior to your next appointment. If you arrive late, you may be asked to reschedule.   We look forward to seeing you next time. Please call our clinic at [phone] if you have any questions or concerns. The best time to call is Monday-Friday from 9am-4pm, but there is someone available 24/7. If after hours or the weekend, call the main hospital number and ask for the Internal Medicine Resident On-Call. If you need medication refills, please notify your pharmacy one week in advance and  they will send [place] a request.  Thank you for letting us take part in your care. Wishing you the best!  Thank you, Dr. [facility]

## 2021-08-09 LAB — LIPID PANEL
Chol/HDL Ratio: 3.5 ratio (ref 0.0–4.4)
Cholesterol, Total: 242 mg/dL — ABNORMAL HIGH (ref 100–199)
HDL: 70 mg/dL (ref >39)
LDL Chol Calc (NIH): 148 mg/dL — ABNORMAL HIGH (ref 0–99)
Triglycerides: 136 mg/dL (ref 0–149)
VLDL Cholesterol Cal: 24 mg/dL (ref 5–40)

## 2021-08-09 NOTE — Addendum Note (Signed)
Addended by: Lucille Passy on: 08/09/2021 01:37 PM   Modules accepted: Orders

## 2021-08-14 ENCOUNTER — Other Ambulatory Visit (HOSPITAL_COMMUNITY): Payer: Self-pay

## 2021-08-14 MED ORDER — ATORVASTATIN CALCIUM 40 MG PO TABS
40.0000 mg | ORAL_TABLET | Freq: Every day | ORAL | 5 refills | Status: DC
  Filled 2021-08-14 – 2021-09-22 (×2): qty 30, 30d supply, fill #0
  Filled 2022-01-07: qty 30, 30d supply, fill #1
  Filled 2022-02-27: qty 30, 30d supply, fill #2
  Filled 2022-03-29: qty 30, 30d supply, fill #3

## 2021-08-14 NOTE — Addendum Note (Signed)
Addended by: Lucille Passy on: 08/14/2021 10:08 AM   Modules accepted: Orders

## 2021-08-14 NOTE — Progress Notes (Signed)
Internal Medicine Clinic Attending  I saw and evaluated the patient.  I personally confirmed the key portions of the history and exam documented by Dr. Sloan Leiter and I reviewed pertinent patient test results.  The assessment, diagnosis, and plan were formulated together and I agree with the documentation in the resident's note.   Patient should be restarted on at least a moderate intensity statin given her history of diabetes, regardless of her 10 year ASCVD risk score.

## 2021-08-22 ENCOUNTER — Other Ambulatory Visit (HOSPITAL_COMMUNITY): Payer: Self-pay

## 2021-09-17 ENCOUNTER — Other Ambulatory Visit (HOSPITAL_COMMUNITY): Payer: Self-pay

## 2021-09-22 MED FILL — Liraglutide Soln Pen-injector 18 MG/3ML (6 MG/ML): SUBCUTANEOUS | 30 days supply | Qty: 9 | Fill #2 | Status: AC

## 2021-09-24 ENCOUNTER — Other Ambulatory Visit (HOSPITAL_COMMUNITY): Payer: Self-pay

## 2021-09-24 ENCOUNTER — Ambulatory Visit (INDEPENDENT_AMBULATORY_CARE_PROVIDER_SITE_OTHER): Payer: Self-pay | Admitting: *Deleted

## 2021-09-24 DIAGNOSIS — Z23 Encounter for immunization: Secondary | ICD-10-CM

## 2022-01-07 ENCOUNTER — Other Ambulatory Visit (HOSPITAL_COMMUNITY): Payer: Self-pay

## 2022-01-07 ENCOUNTER — Other Ambulatory Visit: Payer: Self-pay | Admitting: Student

## 2022-01-07 ENCOUNTER — Other Ambulatory Visit: Payer: Self-pay | Admitting: Internal Medicine

## 2022-01-07 DIAGNOSIS — E1142 Type 2 diabetes mellitus with diabetic polyneuropathy: Secondary | ICD-10-CM

## 2022-01-07 MED ORDER — VICTOZA 18 MG/3ML ~~LOC~~ SOPN
1.8000 mg | PEN_INJECTOR | Freq: Every day | SUBCUTANEOUS | 5 refills | Status: DC
  Filled 2022-01-07: qty 9, 30d supply, fill #0

## 2022-01-07 NOTE — Telephone Encounter (Signed)
Next appt scheduled 02/18/22 with Dr Coy Saunas.

## 2022-01-07 NOTE — Telephone Encounter (Signed)
Please forward to PCP

## 2022-01-08 ENCOUNTER — Other Ambulatory Visit (HOSPITAL_COMMUNITY): Payer: Self-pay

## 2022-01-08 MED ORDER — INSULIN LISPRO PROT & LISPRO (75-25 MIX) 100 UNIT/ML KWIKPEN
PEN_INJECTOR | SUBCUTANEOUS | 2 refills | Status: DC
  Filled 2022-01-08: qty 15, 16d supply, fill #0

## 2022-01-08 MED ORDER — GABAPENTIN 300 MG PO CAPS
300.0000 mg | ORAL_CAPSULE | Freq: Three times a day (TID) | ORAL | 2 refills | Status: DC
  Filled 2022-01-08: qty 90, 30d supply, fill #0

## 2022-01-09 ENCOUNTER — Other Ambulatory Visit: Payer: Self-pay | Admitting: Internal Medicine

## 2022-01-09 ENCOUNTER — Other Ambulatory Visit (HOSPITAL_COMMUNITY): Payer: Self-pay

## 2022-01-09 DIAGNOSIS — E1142 Type 2 diabetes mellitus with diabetic polyneuropathy: Secondary | ICD-10-CM

## 2022-01-09 MED ORDER — INSULIN LISPRO PROT & LISPRO (75-25 MIX) 100 UNIT/ML KWIKPEN
PEN_INJECTOR | SUBCUTANEOUS | 2 refills | Status: DC
  Filled 2022-01-09: qty 15, 16d supply, fill #0
  Filled 2022-01-09: qty 15, fill #0
  Filled 2022-03-01: qty 15, 16d supply, fill #0
  Filled 2022-03-29: qty 15, 16d supply, fill #1

## 2022-01-09 MED ORDER — GABAPENTIN 300 MG PO CAPS
300.0000 mg | ORAL_CAPSULE | Freq: Three times a day (TID) | ORAL | 2 refills | Status: DC
  Filled 2022-01-09 – 2022-03-01 (×3): qty 90, 30d supply, fill #0
  Filled 2022-03-29: qty 90, 30d supply, fill #1

## 2022-01-09 MED ORDER — VICTOZA 18 MG/3ML ~~LOC~~ SOPN
1.8000 mg | PEN_INJECTOR | Freq: Every day | SUBCUTANEOUS | 5 refills | Status: DC
  Filled 2022-01-09 – 2022-03-01 (×3): qty 9, 30d supply, fill #0
  Filled 2022-03-29: qty 9, 30d supply, fill #1

## 2022-01-09 NOTE — Progress Notes (Signed)
Reordered medications including Humalog, gabapentin, and Victoza under IM program.

## 2022-01-17 ENCOUNTER — Other Ambulatory Visit (HOSPITAL_COMMUNITY): Payer: Self-pay

## 2022-02-18 ENCOUNTER — Encounter: Payer: No Typology Code available for payment source | Admitting: Student

## 2022-02-25 ENCOUNTER — Other Ambulatory Visit: Payer: Self-pay

## 2022-02-25 ENCOUNTER — Telehealth: Payer: Self-pay | Admitting: Dietician

## 2022-02-25 DIAGNOSIS — Z1231 Encounter for screening mammogram for malignant neoplasm of breast: Secondary | ICD-10-CM

## 2022-02-25 NOTE — Telephone Encounter (Signed)
Let patient know we are happy to help her with retinal camera photos and to schedule this at her next visit

## 2022-02-26 ENCOUNTER — Other Ambulatory Visit: Payer: Self-pay | Admitting: Obstetrics and Gynecology

## 2022-02-26 DIAGNOSIS — Z1231 Encounter for screening mammogram for malignant neoplasm of breast: Secondary | ICD-10-CM

## 2022-02-27 ENCOUNTER — Other Ambulatory Visit (HOSPITAL_COMMUNITY): Payer: Self-pay

## 2022-03-01 ENCOUNTER — Other Ambulatory Visit (HOSPITAL_COMMUNITY): Payer: Self-pay

## 2022-03-29 ENCOUNTER — Other Ambulatory Visit (HOSPITAL_COMMUNITY): Payer: Self-pay

## 2022-03-29 ENCOUNTER — Other Ambulatory Visit: Payer: Self-pay | Admitting: Internal Medicine

## 2022-03-29 DIAGNOSIS — E1142 Type 2 diabetes mellitus with diabetic polyneuropathy: Secondary | ICD-10-CM

## 2022-03-29 MED ORDER — DAPAGLIFLOZIN PROPANEDIOL 5 MG PO TABS
5.0000 mg | ORAL_TABLET | Freq: Every day | ORAL | 3 refills | Status: DC
  Filled 2022-03-29: qty 30, 30d supply, fill #0

## 2022-03-29 NOTE — Telephone Encounter (Signed)
Will refill farxiga medication. Patient needs to follow-up in clinic for diabetes management. ?

## 2022-04-08 ENCOUNTER — Other Ambulatory Visit (HOSPITAL_COMMUNITY): Payer: Self-pay

## 2022-04-11 ENCOUNTER — Ambulatory Visit
Admission: RE | Admit: 2022-04-11 | Discharge: 2022-04-11 | Disposition: A | Discharge status: Home / Self Care | Payer: No Typology Code available for payment source | Source: Ambulatory Visit | Attending: Obstetrics and Gynecology | Admitting: Obstetrics and Gynecology

## 2022-04-11 ENCOUNTER — Ambulatory Visit: Payer: Self-pay | Admitting: *Deleted

## 2022-04-11 VITALS — BP 138/78 | Wt 219.5 lb

## 2022-04-11 DIAGNOSIS — Z01419 Encounter for gynecological examination (general) (routine) without abnormal findings: Secondary | ICD-10-CM

## 2022-04-11 DIAGNOSIS — Z1211 Encounter for screening for malignant neoplasm of colon: Secondary | ICD-10-CM

## 2022-04-11 DIAGNOSIS — Z1231 Encounter for screening mammogram for malignant neoplasm of breast: Secondary | ICD-10-CM

## 2022-04-11 NOTE — Patient Instructions (Signed)
Explained breast self awareness with [place]. Pap smear completed today. Let her know BCCCP will cover Pap smears and HPV typing every 5 years unless has a history of abnormal Pap smears. Referred patient to the [facility] for a screening mammogram on mobile unit. Appointment scheduled Thursday, April 11, 2022 at 1130. Patient aware of appointment and will be there. Let patient know will follow up with her within the next couple weeks with results of her Pap smear by phone. Informed patient that the Breast Center will follow up with her within the next couple of weeks with results of her mammogram by letter or phone. [place] verbalized understanding. ? ?Shyah Cadmus, Arvil Chaco, RN ?10:55 AM ? ? ? ?

## 2022-04-11 NOTE — Progress Notes (Signed)
[name] is a 56 y.o. G4P0 female who presents to [facility] clinic today with no complaints.  ?  ?Pap Smear: Pap smear completed today. Last Pap smear was 10/07/2016 at [facility] clinic and was normal. Per patient has no history of an abnormal Pap smear. Last Pap smear result is available in Epic. ?  ?Physical exam: ?Breasts ?Breasts symmetrical. Slight redness under bilateral breasts at bra line due to patient stated she sweats. Discussed keeping area dry and using a barrier ointment such as Aquaphor to protect skin. No nipple retraction bilateral breasts. No nipple discharge bilateral breasts. No lymphadenopathy. No lumps palpated bilateral breasts. No complaints of pain or tenderness on exam.    ? ?MS DIGITAL SCREENING BILATERAL ? ?Result Date: 08/04/2017 ?CLINICAL DATA:  Screening. EXAM: DIGITAL SCREENING BILATERAL MAMMOGRAM WITH CAD COMPARISON:  Previous exam(s). ACR Breast Density Category b: There are scattered areas of fibroglandular density. FINDINGS: There are no findings suspicious for malignancy. Images were processed with CAD. IMPRESSION: No mammographic evidence of malignancy. A result letter of this screening mammogram will be mailed directly to the patient. RECOMMENDATION: Screening mammogram in one year. (Code:[id number]-01Y) BI-RADS CATEGORY  1: Negative. Electronically Signed   By: Annia Belt M.D.   On: 08/04/2017 07:20  ? ?MS DIGITAL DIAG TOMO BILAT ? ?Result Date: 04/26/2021 ?CLINICAL DATA:  Palpable lump in the right breast felt by the patient. Palpable lump in the medial right breast felt by the patient's clinician. EXAM: DIGITAL DIAGNOSTIC BILATERAL MAMMOGRAM WITH TOMOSYNTHESIS AND CAD; ULTRASOUND RIGHT BREAST LIMITED TECHNIQUE: Bilateral digital diagnostic mammography and breast tomosynthesis was performed. The images were evaluated with computer-aided detection.; Targeted ultrasound examination of the right breast was performed COMPARISON:  Previous exam(s). ACR Breast  Density Category b: There are scattered areas of fibroglandular density. FINDINGS: The patient is feeling an oil cyst in the anterior right breast. No suspicious findings in the region of the palpable lump felt by the patient's physician. No other suspicious findings. Targeted ultrasound is performed, showing an oil cysts in the region of the patient's palpable lump. No abnormalities in the region of the palpable lump felt by the patient's clinician. IMPRESSION: No mammographic or sonographic evidence of malignancy. RECOMMENDATION: Treatment of the palpable lump felt by the patient's clinician should be based on clinical and physical exam given lack of imaging findings. Recommend annual screening mammography. I have discussed the findings and recommendations with the patient. If applicable, a reminder letter will be sent to the patient regarding the next appointment. BI-RADS CATEGORY  2: Benign. Electronically Signed   By: Gerome Sam III M.D   On: 04/26/2021 15:23   ? ?Pelvic/Bimanual ?Ext Genitalia ?No lesions, no swelling and no discharge observed on external genitalia.      ?  ?Vagina ?Vagina pink and normal texture. No lesions or discharge observed in vagina.      ?  ?Cervix ?Cervix is present. Cervix pink and of normal texture. No discharge observed.  ?  ?Uterus ?Uterus is present and palpable. Uterus in normal position and normal size.      ?  ?Adnexae ?Bilateral ovaries present and palpable. No tenderness on palpation.       ?  ?Rectovaginal ?No rectal exam completed today since patient had no rectal complaints. No skin abnormalities observed on exam.   ?  ?Smoking History: ?Patient is a former smoker that quit in 1980. ?  ?Patient Navigation: ?Patient education provided. Access to services provided for patient through [facility]  program.  ? ?Colorectal Cancer Screening: ?Per patient has had colonoscopy completed on 02/29/2020 at [facility]. FIT Test given to patient to complete. No complaints today.  ?   ?Breast and Cervical Cancer Risk Assessment: ?Patient has family history of her mother and a maternal aunt having breast cancer. Patient has no known genetic mutations or history of radiation treatment to the chest before age 48. Patient does not have history of cervical dysplasia, immunocompromised, or DES exposure in-utero. ? ?Risk Assessment   ? ? Risk Scores   ? ?   04/11/2022 04/26/2021  ? Last edited by: Meryl Dare, CMA Meryl Dare, CMA  ? 5-year risk: 2.3 % 2.2 %  ? Lifetime risk: 15.1 % 15.4 %  ? ?  ?  ? ?  ? ? ?A: ?BCCCP exam with pap smear ?No complaints. ? ?P: ?Referred [facility] to the [facility] for a screening mammogram on mobile unit. Appointment scheduled Thursday, April 11, 2022 at 1130. ? ?Priscille Heidelberg, RN ?04/11/2022 10:55 AM   ?

## 2022-04-15 ENCOUNTER — Telehealth: Payer: Self-pay

## 2022-04-15 NOTE — Telephone Encounter (Signed)
Pt is requesting a call back .. she stated that she and her husband has been exposed to covid at the moment she is having body aches  .. they were exposed on Wednesday   ?

## 2022-04-15 NOTE — Telephone Encounter (Signed)
Talked to pt - she stated she has not taken a covid test, c/o being achy and cold. She had 2 covid vaccines but no booster shot.  She's able to take a test and will call our office back with the results. ?

## 2022-04-16 NOTE — Telephone Encounter (Signed)
Called pt about covid test. She stated it's positive; also her husband's and sister-in-law.she stated she's achy,coughing,?fever. She took [employer] brand pain/fever reliever medication and the fever broke b/c she was sweaty and felt better. She does have bronchitis. Instructed to rest, drink fluids,stay isolated for 5 days. And stay at home until the fever has resolved w/o taking any medication. And to call if symptoms become worse. ?

## 2022-04-17 ENCOUNTER — Telehealth: Payer: Self-pay

## 2022-04-17 LAB — CYTOLOGY - PAP
Comment: NEGATIVE
High risk HPV: NEGATIVE

## 2022-04-17 NOTE — Telephone Encounter (Signed)
Called patient and gave pap smear results. Informed patient that pap showed Atrophic pattern with epithelial atypia and negative HPV. Explained to patient that this results is due to menopause and low estrogen levels. Per cytology's recommendation and [facility] next pap smear will be due in 3 years. Patient voiced understanding. ?

## 2022-04-17 NOTE — Telephone Encounter (Signed)
Left message with name and number for patient to call back for lab results. ?

## 2022-04-29 ENCOUNTER — Ambulatory Visit (INDEPENDENT_AMBULATORY_CARE_PROVIDER_SITE_OTHER): Payer: Self-pay | Admitting: Internal Medicine

## 2022-04-29 ENCOUNTER — Other Ambulatory Visit (HOSPITAL_COMMUNITY): Payer: Self-pay

## 2022-04-29 ENCOUNTER — Other Ambulatory Visit: Payer: Self-pay

## 2022-04-29 ENCOUNTER — Encounter: Payer: Self-pay | Admitting: Internal Medicine

## 2022-04-29 VITALS — BP 120/69 | HR 82 | Temp 97.8°F | Ht 66.0 in | Wt 217.3 lb

## 2022-04-29 DIAGNOSIS — E7800 Pure hypercholesterolemia, unspecified: Secondary | ICD-10-CM

## 2022-04-29 DIAGNOSIS — R053 Chronic cough: Secondary | ICD-10-CM

## 2022-04-29 DIAGNOSIS — E1142 Type 2 diabetes mellitus with diabetic polyneuropathy: Secondary | ICD-10-CM

## 2022-04-29 DIAGNOSIS — J069 Acute upper respiratory infection, unspecified: Secondary | ICD-10-CM

## 2022-04-29 DIAGNOSIS — I10 Essential (primary) hypertension: Secondary | ICD-10-CM

## 2022-04-29 LAB — GLUCOSE, CAPILLARY: Glucose-Capillary: 279 mg/dL — ABNORMAL HIGH (ref 70–99)

## 2022-04-29 LAB — POCT GLYCOSYLATED HEMOGLOBIN (HGB A1C): Hemoglobin A1C: 10.8 % — AB (ref 4.0–5.6)

## 2022-04-29 MED ORDER — BENZONATATE 100 MG PO CAPS
ORAL_CAPSULE | ORAL | 0 refills | Status: DC
  Filled 2022-04-29: qty 30, 30d supply, fill #0

## 2022-04-29 MED ORDER — GABAPENTIN 300 MG PO CAPS
300.0000 mg | ORAL_CAPSULE | Freq: Three times a day (TID) | ORAL | 2 refills | Status: DC
  Filled 2022-04-29: qty 90, 30d supply, fill #0
  Filled 2022-06-02: qty 90, 30d supply, fill #1
  Filled 2022-06-28 – 2022-10-03 (×2): qty 90, 30d supply, fill #2

## 2022-04-29 MED ORDER — ALBUTEROL SULFATE HFA 108 (90 BASE) MCG/ACT IN AERS
2.0000 | INHALATION_SPRAY | Freq: Four times a day (QID) | RESPIRATORY_TRACT | 2 refills | Status: DC | PRN
  Filled 2022-04-29: qty 6.7, 25d supply, fill #0
  Filled 2022-06-28 – 2023-02-28 (×4): qty 6.7, 25d supply, fill #1

## 2022-04-29 MED ORDER — BENZONATATE 100 MG PO CAPS
100.0000 mg | ORAL_CAPSULE | Freq: Three times a day (TID) | ORAL | 0 refills | Status: DC | PRN
  Filled 2022-04-29: qty 30, 10d supply, fill #0

## 2022-04-29 MED ORDER — INSULIN LISPRO PROT & LISPRO (75-25 MIX) 100 UNIT/ML KWIKPEN
PEN_INJECTOR | SUBCUTANEOUS | 2 refills | Status: DC
  Filled 2022-04-29: qty 30, 31d supply, fill #0
  Filled 2022-06-28: qty 27, 28d supply, fill #1
  Filled 2022-10-03: qty 30, 31d supply, fill #1
  Filled 2022-10-31 – 2022-12-30 (×3): qty 30, 31d supply, fill #2

## 2022-04-29 MED ORDER — VICTOZA 18 MG/3ML ~~LOC~~ SOPN
1.8000 mg | PEN_INJECTOR | Freq: Every day | SUBCUTANEOUS | 5 refills | Status: DC
  Filled 2022-04-29: qty 9, 30d supply, fill #0
  Filled 2022-06-28 – 2022-08-12 (×2): qty 9, 30d supply, fill #1
  Filled 2022-10-03: qty 9, 30d supply, fill #2
  Filled 2022-10-31 – 2022-12-30 (×3): qty 9, 30d supply, fill #3
  Filled 2023-01-30: qty 9, 30d supply, fill #4
  Filled 2023-02-28 (×2): qty 9, 30d supply, fill #5

## 2022-04-29 MED ORDER — ATORVASTATIN CALCIUM 40 MG PO TABS
40.0000 mg | ORAL_TABLET | Freq: Every day | ORAL | 2 refills | Status: DC
  Filled 2022-04-29: qty 30, 30d supply, fill #0
  Filled 2022-06-02: qty 30, 30d supply, fill #1
  Filled 2022-06-28 – 2022-12-30 (×3): qty 30, 30d supply, fill #2
  Filled 2023-01-30: qty 30, 30d supply, fill #3
  Filled 2023-02-28 – 2023-04-02 (×3): qty 30, 30d supply, fill #4

## 2022-04-29 MED ORDER — DAPAGLIFLOZIN PROPANEDIOL 5 MG PO TABS
5.0000 mg | ORAL_TABLET | Freq: Every day | ORAL | 2 refills | Status: DC
  Filled 2022-04-29: qty 30, 30d supply, fill #0
  Filled 2022-06-02: qty 30, 30d supply, fill #1
  Filled 2022-06-28 – 2022-10-03 (×2): qty 30, 30d supply, fill #2
  Filled 2022-10-31: qty 30, 30d supply, fill #3
  Filled 2022-12-02 – 2022-12-30 (×2): qty 30, 30d supply, fill #4
  Filled 2023-01-30: qty 30, 30d supply, fill #5
  Filled 2023-02-28 (×2): qty 30, 30d supply, fill #6

## 2022-04-29 NOTE — Assessment & Plan Note (Signed)
Patient has history of hypertension though antihypertensives discontinued at last appointment given patient was not taking these and normotensive.  Patient continues to be normotensive with BP 120/69 while not on medication.  Blood pressures well controlled without antihypertensives. ? ?

## 2022-04-29 NOTE — Progress Notes (Signed)
?  CC: T2DM follow-up, persistent cough ? ?HPI: ? ?[name] is a 56 y.o. female with a past medical history stated below and presents today for T2DM follow-up, persistent cough. Please see problem based assessment and plan for additional details. ? ?Past Medical History:  ?Diagnosis Date  ? Anal fissure   ? Breast mass   ? Bronchitis   ? Carpal tunnel syndrome, bilateral   ? Cholelithiasis   ? Depression   ? Diabetes mellitus   ? Type II  ? Essential hypertension   ? GERD (gastroesophageal reflux disease)   ? 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 Hypokalemia   ? 2/2 diarrhea  ? Menorrhagia   ? Obesity   ? Polycystic ovarian disease   ? Renal calculi   ? Renal calculus   ? Sleep apnea   ? no cpap  ? Sleep apnea, obstructive   ? ? ?Current Outpatient Medications on File Prior to Visit  ?Medication Sig Dispense Refill  ? colestipol (COLESTID) 1 g tablet Take 2 tablets (2 g total) by mouth 2 (two) times daily. (Patient not taking: Reported on 04/26/2021) 120 tablet 2  ? diclofenac Sodium (VOLTAREN) 1 % GEL Apply 4 g topically 4 (four) times daily. (Patient not taking: Reported on 04/26/2021) 100 g 1  ? glucose blood test strip USE TO TEST BLOOD GLUCOSE UP TO 4 TIMES PER DAY. 200 strip 12  ? Insulin Pen Needle (UNIFINE PENTIPS) 31G X 6 MM MISC USE AS DIRECTED WITH VICTOZA 100 each 0  ? Insulin Pen Needle 32G X 6 MM MISC Please use to inject insulin five times daily. ICD 10: E11.65 490 each 11  ? Microlet Lancets MISC Use to check blood glucose up to 4 times per day. 200 each 12  ? ?No current facility-administered medications on file prior to visit.  ? ? ?Family History  ?Problem Relation Age of Onset  ? Hypertension Mother   ? Breast cancer Mother   ? Diabetes Father   ? Breast cancer Maternal Aunt   ? Colon cancer Neg Hx   ? Esophageal cancer Neg Hx   ? Stomach cancer Neg Hx   ? Rectal cancer Neg Hx   ? ? ?Review of Systems: ?ROS negative except for what is noted on the assessment and plan. ? ?Vitals:   ? 04/29/22 1416  ?BP: 120/69  ?Pulse: 82  ?Temp: 97.8 ?F (36.6 ?C)  ?TempSrc: Oral  ?SpO2: 96%  ?Weight: 217 lb 4.8 oz (98.6 kg)  ?Height: 5\' 6"  (1.676 m)  ? ? ? ?Physical Exam: ?General: Well appearing obese Caucasian female, NAD ?HENT: normocephalic, atraumatic, external ears and nares appear normal ?EYES: conjunctiva non-erythematous, no scleral icterus ?CV: regular rate, normal rhythm, no murmurs, rubs, gallops. ?Pulmonary: normal work of breathing on RA, lungs clear to auscultation, no rales, wheezes, rhonchi ?Abdominal: non-distended, soft, non-tender to palpation, normal BS ?Skin: Warm and dry, no rashes or lesions ?Neurological: ?MS: awake, alert and oriented x3, normal speech and fund of knowledge ?Motor: moves all extremities antigravity ?Psych: normal affect ? ? ? ?Assessment & Plan:  ? ?See Encounters Tab for problem based charting. ? ?Patient discussed with Dr.  ? ?Lafonda Mosses, M.D. ?[facility] Internal Medicine, PGY-1 ?Pager: [phone] ?Date 04/29/2022 Time 3:28 PM ? ?

## 2022-04-29 NOTE — Assessment & Plan Note (Addendum)
Last hemoglobin A1c 8.5%.  Hemoglobin A1c this OV 10.8%.  Current medical regimen includes Victoza 1.8 mg daily and Farxiga 5 mg daily as well as Humalog 50 units a.m. and 45 units p.m.  However patient has only been taking Humalog 15 units nightly and not using Humalog in the mornings.  Otherwise she reports adherence to [place] and [place].  She has not been checking her CBGs since her last appointment.  Reports has her glucometer somewhere, does not need a new one at this time.  Endorses polydipsia and polyuria.  Denies symptoms of hypoglycemia.  Patient is uninsured but would benefit from meeting with diabetes coordinator [name] to discuss trial of CGM to optimize insulin regimen. ? ?Patient also has new complaint of visual defect.  In the last 6 months she has noted progressive worsening of a visual defect in her right eye.  She sees what she calls a dark curling in the middle of her visual field.  This persists every day.  Last saw her eye doctor 1 year ago. ? ?Currently blood glucose poorly controlled likely due to nonadherence to Humalog and dietary indiscretion.  Given patient's history of type 2 diabetes patient should see our ophthalmologist to have this evaluated. ?Plan: ?-Take Humalog 75/25  50 units daily and 45 units nightly ?-Continue Victoza 1.8 mg daily ?-Continue Farxiga 5 mg daily ?-Urine microalbumin today ?-Will let us know if she needs a new glucometer ?-Patient encouraged to see ophthalmologist at [facility] for full evaluation of visual defect ? ?Refills sent to [facility] pharmacy ? ?ADDENDUM: Urine micro albumin/creatinine ratio mildly elevated 39.  Please discuss starting ACE or ARB at next OV. ?

## 2022-04-29 NOTE — Patient Instructions (Signed)
Thank you, Amy Norris for allowing [place] to provide your care today. Today we discussed: ? ?Cough: I have ordered tessalon for you. I have also ordered an inhaler, see if this helps with you symptoms. ? ?T2DM: Take your diabetes medications: ? ?Victoza 1.8mg  injection nightly ?Humalog 15 units in the morning and 45 units at night ?Farxiga 5 mg tablet once a day ? ?I will also refer you to our diabetes specialist and she can see if we would be able to get you a free trial of a continuous glucose monitor placed for 2 weeks so that we can see how your sugars are doing without you needing to check. ? ?Please let us know if you need a new glucose monitor if you are unable to find yours at home. ? ?I am worried about the vision changes that you have had in the last 6 months.  I would like for you to see the ophthalmologist at [facility] for this.  This will be expensive but I think will be worth it to figure out what is happening with her vision. ? ?I have ordered the following labs for you: ? ? ?Lab Orders    ?     Glucose, capillary    ?     Microalbumin / Creatinine Urine Ratio    ?     POC Hbg A1C     ? ? ?My Chart Access: ?[email or web address] ? ?Please follow-up in in 1 month to recheck your blood sugars. ? ?Please make sure to arrive 15 minutes prior to your next appointment. If you arrive late, you may be asked to reschedule.  ?  ?We look forward to seeing you next time. Please call our clinic at [phone] if you have any questions or concerns. The best time to call is Monday-Friday from 9am-4pm, but there is someone available 24/7. If after hours or the weekend, call the main hospital number and ask for the Internal Medicine Resident On-Call. If you need medication refills, please notify your pharmacy one week in advance and they will send [place] a request. ?  ?Thank you for letting us take part in your care. Wishing you the best! ? ?Ellison Carwin, MD ?04/29/2022, 3:28  PM ?IM Resident, PGY-1 ? ?

## 2022-04-29 NOTE — Assessment & Plan Note (Signed)
Patient has remote smoking history, smoked 1 pack/month, quit 35 years ago.  She has no diagnosis of COPD and has never had PFTs (uninsured, financial barrier).  She does report chronic bronchitis which worsened after having COVID.  On exam lungs clear, no crackles, wheezes, rhonchi.  Patient usually uses [facility] for her cough at home and requests refill for this.  She denies symptoms of shortness of breath on exertion.  She denies history of allergies, only intermittently has symptoms of GERD and uses Pepto-Bismol for this. ? ?On assessment, lower suspicion for COPD though patient may benefit from trying albuterol and seeing if this helps since she is unable to get PFTs due to financial barrier.  If it does significantly help with her symptoms we may want to consider adding maintenance inhaler. ?Plan: ?-Refill Tessalon  ?-Start albuterol as needed ?

## 2022-04-30 ENCOUNTER — Ambulatory Visit
Admission: RE | Admit: 2022-04-30 | Discharge: 2022-04-30 | Disposition: A | Discharge status: Home / Self Care | Payer: No Typology Code available for payment source | Source: Ambulatory Visit | Attending: Obstetrics and Gynecology | Admitting: Obstetrics and Gynecology

## 2022-04-30 ENCOUNTER — Other Ambulatory Visit (HOSPITAL_COMMUNITY): Payer: Self-pay

## 2022-04-30 NOTE — Progress Notes (Signed)
Internal Medicine Clinic Attending ? ?Case discussed with Dr. Vinetta Bergamo  At the time of the visit.  We reviewed the resident?s history and exam and pertinent patient test results.  I agree with the assessment, diagnosis, and plan of care documented in the resident?s note.  ? ?Note: patient has 75/25 formulation of Humalog. I agree that the main reason for recent poorly controlled sugars is non-adherence to prescribed regimen. ?

## 2022-05-01 LAB — MICROALBUMIN / CREATININE URINE RATIO
Creatinine, Urine: 58.5 mg/dL
Microalb/Creat Ratio: 39 mg/g creat — ABNORMAL HIGH (ref 0–29)
Microalbumin, Urine: 22.9 ug/mL

## 2022-05-29 NOTE — Progress Notes (Signed)
   CC: diabetes follow up  HPI:  [name] is a 56 y.o. with medical history as below presenting to [facility] for diabetes follow up.  Please see problem-based list for further details, assessments, and plans.  Past Medical History:  Diagnosis Date   Anal fissure    Breast mass    Bronchitis    Carpal tunnel syndrome, bilateral    Cholelithiasis    Depression    Diabetes mellitus    Type II   Essential hypertension    GERD (gastroesophageal reflux disease)    Herpes simplex type 1 infection    vaginal   Hypokalemia    2/2 diarrhea   Menorrhagia    Obesity    Polycystic ovarian disease    Renal calculi    Renal calculus    Sleep apnea    no cpap   Sleep apnea, obstructive    Review of Systems:  Review of system negative unless stated in the problem list or HPI.    Physical Exam General: NAD, appears older than stated age Head: Normocephalic without scalp lesions.  Eyes: PERRLA, good peripheral vision Mouth and Throat: Lips normal color, without lesions. Moist mucus membrane. Neck: Neck supple with full range of motion (ROM). No masses or tenderness. Trachea midline.  Lungs: CTAB, no wheeze, rhonchi or rales.  Cardiovascular: Normal heart sounds, no r/m/g, 2+ pulses in all extremities. No LE edema Abdomen: No TTP, normal bowel sounds MSK: No asymmetry or muscle atrophy.  Skin: warm, dry good skin turgor, no lesions noted Neuro: Alert and oriented. CN grossly intact, slight diminished sensation in bilateral feet.  Psych: Normal mood and normal affect    Assessment & Plan:   See Encounters Tab for problem based charting.  Patient discussed with Dr. Thersa Salt, MD

## 2022-05-30 ENCOUNTER — Ambulatory Visit (INDEPENDENT_AMBULATORY_CARE_PROVIDER_SITE_OTHER): Payer: Self-pay | Admitting: Internal Medicine

## 2022-05-30 ENCOUNTER — Other Ambulatory Visit (HOSPITAL_COMMUNITY): Payer: Self-pay

## 2022-05-30 ENCOUNTER — Other Ambulatory Visit: Payer: Self-pay

## 2022-05-30 ENCOUNTER — Encounter: Payer: Self-pay | Admitting: Internal Medicine

## 2022-05-30 VITALS — BP 149/81 | HR 99 | Temp 98.5°F | Ht 66.0 in | Wt 215.1 lb

## 2022-05-30 DIAGNOSIS — E1142 Type 2 diabetes mellitus with diabetic polyneuropathy: Secondary | ICD-10-CM

## 2022-05-30 DIAGNOSIS — Z794 Long term (current) use of insulin: Secondary | ICD-10-CM

## 2022-05-30 DIAGNOSIS — Z87891 Personal history of nicotine dependence: Secondary | ICD-10-CM

## 2022-05-30 DIAGNOSIS — I10 Essential (primary) hypertension: Secondary | ICD-10-CM

## 2022-05-30 DIAGNOSIS — E1169 Type 2 diabetes mellitus with other specified complication: Secondary | ICD-10-CM

## 2022-05-30 LAB — GLUCOSE, CAPILLARY: Glucose-Capillary: 150 mg/dL — ABNORMAL HIGH (ref 70–99)

## 2022-05-30 MED ORDER — LISINOPRIL 10 MG PO TABS
10.0000 mg | ORAL_TABLET | Freq: Every day | ORAL | 11 refills | Status: DC
  Filled 2022-05-30: qty 30, 30d supply, fill #0
  Filled 2022-06-28 – 2022-10-03 (×2): qty 30, 30d supply, fill #1
  Filled 2022-10-31 – 2022-12-30 (×2): qty 30, 30d supply, fill #2
  Filled 2023-01-30: qty 30, 30d supply, fill #3
  Filled 2023-02-28 – 2023-04-02 (×3): qty 30, 30d supply, fill #4
  Filled 2023-05-02: qty 30, 30d supply, fill #5
  Filled 2023-05-30: qty 30, 30d supply, fill #6

## 2022-05-30 NOTE — Patient Instructions (Signed)
[place], it was a pleasure seeing you today! You endorsed feeling well today. Below are some of the things we talked about this visit. We look forward to seeing you in the follow up appointment!  Today we discussed: You presented with a diabetes follow-up but stated you had not checked your sugars at home.  It is hard to make an adjustment to your diabetes medication without having the blood sugar readings so we will continue your current regimen and see you back in 2 weeks with a blood sugar log.  We will start a blood pressure medication because your blood pressure was elevated today and to protect your kidneys from the high sugars.  I would like you to bring a blood pressure and a sugar log to your next appointment.  Please schedule an appointment with the eye doctor as soon as you are able to do so.  I will also encourage multiple small meals over 1 meal a day and decreasing your soda intake.  I have ordered the following labs today:   Lab Orders         Glucose, capillary       Referrals ordered today:   Referral Orders  No referral(s) requested today     I have ordered the following medication/changed the following medications:   Stop the following medications: There are no discontinued medications.   Start the following medications: No orders of the defined types were placed in this encounter.    Follow-up: 2 week follow up   Please make sure to arrive 15 minutes prior to your next appointment. If you arrive late, you may be asked to reschedule.   We look forward to seeing you next time. Please call our clinic at [phone] if you have any questions or concerns. The best time to call is Monday-Friday from 9am-4pm, but there is someone available 24/7. If after hours or the weekend, call the main hospital number and ask for the Internal Medicine Resident On-Call. If you need medication refills, please notify your pharmacy one week in advance and they will send [place] a  request.  Thank you for letting us take part in your care. Wishing you the best!  Thank you, Idamae Schuller, MD

## 2022-06-01 NOTE — Assessment & Plan Note (Addendum)
Patient presented to clinic for a follow up for her diabetes. Last A1c was 10.8 on 04/29/22. At that time she was non-compliant with her regimen including victoza 1.8, farxiga 5 mg, and humalog 50 units am and 45 units pm, gabapentin for neuropathy. Currently she is reporting compliance with her regimen but states she has not check her CBG since last clinic visit. Unable to assess if the regimen is appropriate given no data. Counseled on the benefits of daily exercise, limiting processed foods and high sugar foods, and weight loss. She does endorse drinking 3-2 liters of mellow yellow per week consistently. I advised her to cut back slowly. Patient still has presence of visual defect that she states is stable. She was unable to follow up with ophthalmologist since last visit due to insufficient funds but states she will follow up with them this month.  -Continue current regimen -Follow up in 2 weeks with CBG readings including fasting readings.  -Follow up with ophthalmologist

## 2022-06-01 NOTE — Assessment & Plan Note (Signed)
Patient is hypertensive at 149/81. She does not check her blood pressure at home. She was previously on lisinopril but was discontinued as patient continued to be normotensive. Her last A1c was elevated and her microalbumin:creatiine ratio was elevated at 39.  -Plan to restart 10 mg lisinopril daily for bp and nephro-protection. -2 week follow up for bp check

## 2022-06-03 ENCOUNTER — Other Ambulatory Visit (HOSPITAL_COMMUNITY): Payer: Self-pay

## 2022-06-03 NOTE — Progress Notes (Signed)
Internal Medicine Clinic Attending  Case discussed with Dr. Khan  At the time of the visit.  We reviewed the resident's history and exam and pertinent patient test results.  I agree with the assessment, diagnosis, and plan of care documented in the resident's note.  

## 2022-06-13 ENCOUNTER — Encounter: Payer: No Typology Code available for payment source | Admitting: Internal Medicine

## 2022-06-28 ENCOUNTER — Other Ambulatory Visit (HOSPITAL_COMMUNITY): Payer: Self-pay

## 2022-06-28 ENCOUNTER — Other Ambulatory Visit: Payer: Self-pay | Admitting: Internal Medicine

## 2022-06-28 DIAGNOSIS — E1142 Type 2 diabetes mellitus with diabetic polyneuropathy: Secondary | ICD-10-CM

## 2022-06-28 MED ORDER — CONTOUR NEXT TEST VI STRP
ORAL_STRIP | Freq: Four times a day (QID) | 12 refills | Status: DC
  Filled 2022-06-28 – 2023-05-30 (×6): qty 100, 25d supply, fill #0

## 2022-07-08 ENCOUNTER — Other Ambulatory Visit (HOSPITAL_COMMUNITY): Payer: Self-pay

## 2022-07-10 ENCOUNTER — Other Ambulatory Visit (HOSPITAL_COMMUNITY): Payer: Self-pay

## 2022-08-12 ENCOUNTER — Other Ambulatory Visit: Payer: Self-pay

## 2022-08-12 ENCOUNTER — Encounter: Payer: Self-pay | Admitting: Student

## 2022-08-12 ENCOUNTER — Ambulatory Visit (INDEPENDENT_AMBULATORY_CARE_PROVIDER_SITE_OTHER): Payer: Self-pay | Admitting: Student

## 2022-08-12 ENCOUNTER — Other Ambulatory Visit (HOSPITAL_COMMUNITY): Payer: Self-pay

## 2022-08-12 VITALS — BP 128/77 | HR 86 | Temp 98.6°F | Resp 24 | Ht 66.0 in | Wt 225.8 lb

## 2022-08-12 DIAGNOSIS — Z794 Long term (current) use of insulin: Secondary | ICD-10-CM

## 2022-08-12 DIAGNOSIS — M6530 Trigger finger, unspecified finger: Secondary | ICD-10-CM

## 2022-08-12 DIAGNOSIS — E1142 Type 2 diabetes mellitus with diabetic polyneuropathy: Secondary | ICD-10-CM

## 2022-08-12 DIAGNOSIS — F320 Major depressive disorder, single episode, mild: Secondary | ICD-10-CM

## 2022-08-12 DIAGNOSIS — H9313 Tinnitus, bilateral: Secondary | ICD-10-CM | POA: Insufficient documentation

## 2022-08-12 DIAGNOSIS — F3340 Major depressive disorder, recurrent, in remission, unspecified: Secondary | ICD-10-CM

## 2022-08-12 DIAGNOSIS — Z139 Encounter for screening, unspecified: Secondary | ICD-10-CM

## 2022-08-12 LAB — POCT GLYCOSYLATED HEMOGLOBIN (HGB A1C): Hemoglobin A1C: 8 % — AB (ref 4.0–5.6)

## 2022-08-12 LAB — GLUCOSE, CAPILLARY: Glucose-Capillary: 138 mg/dL — ABNORMAL HIGH (ref 70–99)

## 2022-08-12 MED ORDER — FLUOXETINE HCL 40 MG PO CAPS
40.0000 mg | ORAL_CAPSULE | Freq: Every day | ORAL | 2 refills | Status: DC
  Filled 2022-08-12: qty 30, 30d supply, fill #0
  Filled 2022-10-03: qty 30, 30d supply, fill #1
  Filled 2022-10-31: qty 30, 30d supply, fill #2

## 2022-08-12 NOTE — Assessment & Plan Note (Signed)
Patient continues to have difficulty accessing medications due to financial hardship.  She is now seeking disability status and this is also bringing her distress.  Patient is not able to afford $4 medications needed for her chronic conditions at this time.  She is also in need of a referral to specialized services, cardiology being one of them, but is not able to pay out-of-pocket. patient's husband is now on disability, and patient wonders whether she will be able to be eligible for orange card benefits.  -Placed ambulatory referral to community care coordination

## 2022-08-12 NOTE — Patient Instructions (Addendum)
Thank you, [name] for allowing [place] to provide your care today. Today we discussed your diabetes, your ear ringing, and your mood.  We encourage you to bring your blood glucose monitor to the next visit. Please keep your diet and exercise changes and taking your diabetes mediations.  We are referring you to our social worker to re-try the orange card. That would be good for you as you will be able to see the ear doctor.  For depressions, we are prescribing you with Prozac 40 mg daily  You also need an eye exam; here are some places that offer low cost options: [facility] [phone]  [facility] [phone]   Lab Orders         POC Hbg A1C      I will call if any are abnormal. All of your labs can be accessed through "My Chart".  Follow up in the next 1-2 weeks when one of our providers who do injections are here  Please make sure to arrive 15 minutes prior to your next appointment. If you arrive late, you may be asked to reschedule.    We look forward to seeing you next time. Please call our clinic at [phone] if you have any questions or concerns. The best time to call is Monday-Friday from 9am-4pm, but there is someone available 24/7. If after hours or the weekend, call the main hospital number and ask for the Internal Medicine Resident On-Call. If you need medication refills, please notify your pharmacy one week in advance and they will send [place] a request.   Thank you for letting us take part in your care. Wishing you the best!  Morene Crocker, MD 08/12/2022, 1:30 PM [facility] Internal Medicine Resident, PGY-1

## 2022-08-12 NOTE — Progress Notes (Signed)
2300    Subjective:  CC: diabetes follow up  HPI:  [name] is a 56 y.o. female with a past medical history stated below and presents today for diabetes follow up, trigger finger pain, and mood changes. Please see problem based assessment and plan for additional details.  Past Medical History:  Diagnosis Date   Anal fissure    Breast mass    Bronchitis    Carpal tunnel syndrome, bilateral    Cholelithiasis    Depression    Diabetes mellitus    Type II   Essential hypertension    GERD (gastroesophageal reflux disease)    Herpes simplex type 1 infection    vaginal   Hypokalemia    2/2 diarrhea   Menorrhagia    Obesity    Polycystic ovarian disease    Renal calculi    Renal calculus    Sleep apnea    no cpap   Sleep apnea, obstructive     Current Outpatient Medications on Norris Prior to Visit  Medication Sig Dispense Refill   albuterol (VENTOLIN HFA) 108 (90 Base) MCG/ACT inhaler Inhale 2 puffs into the lungs every 6 (six) hours as needed for wheezing or shortness of breath. 6.7 g 2   atorvastatin (LIPITOR) 40 MG tablet Take 1 tablet (40 mg total) by mouth daily. 90 tablet 2   benzonatate (TESSALON) 100 MG capsule Take 1 capsule (100 mg total) by mouth 3 (three) times daily as needed for cough. 30 capsule 0   colestipol (COLESTID) 1 g tablet Take 2 tablets (2 g total) by mouth 2 (two) times daily. (Patient Amy taking: Reported on 04/26/2021) 120 tablet 2   dapagliflozin propanediol (FARXIGA) 5 MG TABS tablet Take 1 tablet (5 mg total) by mouth daily. 90 tablet 2   diclofenac Sodium (VOLTAREN) 1 % GEL Apply 4 g topically 4 (four) times daily. (Patient Amy taking: Reported on 04/26/2021) 100 g 1   gabapentin (NEURONTIN) 300 MG capsule Take 1 capsule (300 mg total) by mouth 3 (three) times daily. 90 capsule 2   glucose blood (CONTOUR NEXT TEST) test strip Use to test blood glucose up to 4 times per day. 200 strip 12   Insulin Lispro Prot & Lispro (HUMALOG MIX 75/25  KWIKPEN) (75-25) 100 UNIT/ML Kwikpen Inject 50 Units into the skin daily with breakfast AND 45 Units daily with dinner. 30 mL 2   Insulin Pen Needle (UNIFINE PENTIPS) 31G X 6 MM MISC USE AS DIRECTED WITH VICTOZA 100 each 0   Insulin Pen Needle 32G X 6 MM MISC Please use to inject insulin five times daily. ICD 10: E11.65 490 each 11   liraglutide (VICTOZA) 18 MG/3ML SOPN Inject 1.8 mg into the skin daily. 9 mL 5   lisinopril (ZESTRIL) 10 MG tablet Take 1 tablet (10 mg total) by mouth daily. 30 tablet 11   Microlet Lancets MISC Use to check blood glucose up to 4 times per day. 200 each 12   No current facility-administered medications on Norris prior to visit.    Family History  Problem Relation Age of Onset   Hypertension Mother    Breast cancer Mother    Diabetes Father    Breast cancer Maternal Aunt    Colon cancer Neg Hx    Esophageal cancer Neg Hx    Stomach cancer Neg Hx    Rectal cancer Neg Hx     Social History   Socioeconomic History   Marital status: Married  Spouse name: Amy Norris   Number of children: Amy Norris   Years of education: Amy Norris   Highest education level: Amy Norris  Occupational History   Occupation: Deli    Employer: [employer]  Tobacco Use   Smoking status: Former    Types: Cigarettes    Quit date: 12/30/1978    Years since quitting: 43.6   Smokeless tobacco: Never  Vaping Use   Vaping Use: Never used  Substance and Sexual Activity   Alcohol use: Yes    Alcohol/week: 0.0 standard drinks of alcohol    Comment: occ.   Drug use: No   Sexual activity: Yes    Birth control/protection: Post-menopausal  Other Topics Concern   Amy Norris  Social History Narrative    Smoked many years ago drinks 1-2 beers on Friday and Saturdays no illegal drug use. Married and lives with her husband and sister-in-law son daughter and grandson.   Financial assistance approved for 100% discount at [facility] and has [facility] card per Amy Norris as of August 06, 2010 6:27 PM   Social Determinants of Health   Financial Resource Strain: Amy Norris  Food Insecurity: No Food Insecurity (04/11/2022)   Hunger Vital Sign    Worried About Running Out of Food in the Last Year: Never true    Ran Out of Food in the Last Year: Never true  Transportation Needs: No Transportation Needs (04/11/2022)   PRAPARE - Administrator, Civil Service (Medical): No    Lack of Transportation (Non-Medical): No  Physical Activity: Amy Norris  Stress: Amy Norris  Social Connections: Amy Norris  Intimate Partner Violence: Amy Norris    Review of Systems: ROS negative except for what is noted on the assessment and plan.  Objective:   Vitals:   08/12/22 1318  BP: 128/77  Pulse: 86  Resp: (!) 24  Temp: 98.6 F (37 C)  TempSrc: Oral  SpO2: 100%  Weight: 225 lb 12.8 oz (102.4 kg)  Height: 5\' 6"  (1.676 m)    Physical Exam: Constitutional: well-appearing woman sitting in exam chair, in no acute distress HENT: normocephalic atraumatic, mucous membranes moist Ears: cerumen visible on ear canal bilaterally. TM visualized; no bulging, fullness, fluid or discharge noted bilaterally. No dizziness or increased tinnitus with head position changes. Patient able to follow commands with normal voice tone. Eyes: conjunctiva non-erythematous Neck: supple Cardiovascular: regular rate and rhythm, no m/r/g Pulmonary/Chest: normal work of breathing on room air, lungs clear to auscultation bilaterally Abdominal: soft, non-tender, non-distended MSK: normal bulk and tone Neurological: alert & oriented x 3, 5/5 strength in bilateral upper and lower extremities, normal gait. Normal romberg test today. Skin: warm and dry Psych: sad mood and affect.     Assessment & Plan:   Type 2 diabetes mellitus with peripheral neuropathy (HCC) Patient with chronic diabetes mellitus present in clinic for a follow up. 05/08/22 A1c 10.8. Patient reports she has been better about diet  and exercise which is an improvement from the last visit.  She did report that sometimes difficult to afford her medications, but thankfully she had enough insulin at home.  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  Repeat A1c today was 8, improved from prior. Amy inclined to change patients current regimen without having home BG trends, though this A1c suggests better management at home.  Patient was  given information about local optometrist that offer low cost vision examination in [place]. - Continue Victoza 1.8, farxiga 5mg  -Continue Humalog 50u AM and 45 units PM -repeat A1c in 3 months  Depression, major, recurrent, in remission (HCC) PHQ-9 today 12.  Patient reports that her mood has been lower, with increased worries, mostly about Monday and her chronic conditions.  She is decreased interest in things that used to bring her joy.  She is worried that she is going to continue getting depressed. She denies suicidal ideation.  Reports that his changes in mood are getting in the way of life and managing her chronic conditions.  Reported she had taken Prozac in the past and would like to start this medication again.  Unfortunately however she will be able to afford it until the first of the next month.  -Started patient on Prozac 40 mg - Discussed the advantage of behavioral health, and encourage patient to seek these resources at next visit  Encounter for screening involving social determinants of health [facility]) Patient continues to have difficulty accessing medications due to financial hardship.  She is now seeking disability status and this is also bringing her distress.  Patient is Amy able to afford $4 medications needed for her chronic conditions at this time.  She is also in need of a referral to specialized services, cardiology being one of them, but is Amy able to pay out-of-pocket. patient's husband is now on disability, and patient wonders whether she will  be able to be eligible for orange card benefits.  -Placed ambulatory referral to community care coordination  Tinnitus of both ears Patient has had ringing in her years since 2017 .she mentions that it has progressively gotten worse in the past few months.  Patient patient is difficult to describe the quality of the ringing, when asked she denies pulsatile, rushing, blowing correctly sticks.  It is Amy high pitch.  She just knows it is continuous.  It is Amy usually associated with vertigo or dizziness, positional changes, head you talking to yourself prosper changes.she does note that it is harder for her to hear the radio or the TV or the still conversations, though she has Amy gone completely deaf on either of the ears .  She denies recent URI, fullness sensation in the ears, drainage, ear pain . vertigo/dizziness mostly associated with positional changes, standing up, for past several months.  Also ringing in both ears, harder to hear conversation and TV.  Of note, patient was restarted on lisinopril in June 2023, but this medication was Amy associated with increased intensity of her chronic tinnitus.   On exam, onfocal neurologic exam, cerumen visible on ear canal bilaterally. TM visualized; no bulging, fullness, fluid or discharge noted bilaterally. No dizziness or increased tinnitus with head position changes. Patient able to follow commands with normal voice tone.patient's gait and balance were assessed, normal. Overall it is unclear what the etiology of this chronic tinnitus is.  At this time do Amy suspect at vascular etiology.  There was Amy dizziness or vertigo elicited on my exam today.  Suspect that this is likely in the setting of progressive hearing loss.  Patient will benefit from ENT follow-up and audiology testing.  However, this is Amy an option at this time as patient does Amy have insurance and cannot pay out-of-pocket -Considered audiology testing once patient is insured/obtains orange  card  Trigger finger Patient returns to clinic with chronic issue.  Middle left trigger finger is flaring up today.  Patient with 6 out of 10 pain with ROM.  Endorses no alcohol standing catching and clicking.  Patient tries to straighten her finger, but endorses pain.  Patient was directed to return to clinic within the next week for steroid injection with Dr. Teena Dunk or Mikey Bussing.  Patient received information for conservative therapy and over-the-counter analgesics to manage pain until she can be seen again.  Patient seen with Dr. Lafonda Mosses

## 2022-08-12 NOTE — Assessment & Plan Note (Signed)
PHQ-9 today 12.  Patient reports that her mood has been lower, with increased worries, mostly about Monday and her chronic conditions.  She is decreased interest in things that used to bring her joy.  She is worried that she is going to continue getting depressed. She denies suicidal ideation.  Reports that his changes in mood are getting in the way of life and managing her chronic conditions.  Reported she had taken Prozac in the past and would like to start this medication again.  Unfortunately however she will be able to afford it until the first of the next month.  -Started patient on Prozac 40 mg - Discussed the advantage of behavioral health, and encourage patient to seek these resources at next visit

## 2022-08-12 NOTE — Assessment & Plan Note (Signed)
Patient with chronic diabetes mellitus present in clinic for a follow up. 05/08/22 A1c 10.8. Patient reports she has been better about diet and exercise which is an improvement from the last visit.  She did report that sometimes difficult to afford her medications, but thankfully she had enough insulin at home.  Patient does have CABG at home but does not use it; she did not bring it today.  Repeat A1c today was 8, improved from prior. Not inclined to change patients current regimen without having home BG trends, though this A1c suggests better management at home.  Patient was given information about local optometrist that offer low cost vision examination in [place]. - Continue Victoza 1.8, farxiga 5mg  -Continue Humalog 50u AM and 45 units PM -repeat A1c in 3 months

## 2022-08-12 NOTE — Assessment & Plan Note (Addendum)
Patient has had ringing in her years since 2017 .she mentions that it has progressively gotten worse in the past few months.  Patient patient is difficult to describe the quality of the ringing, when asked she denies pulsatile, rushing, blowing correctly sticks.  It is not high pitch.  She just knows it is continuous.  It is not usually associated with vertigo or dizziness, positional changes, head you talking to yourself prosper changes.she does note that it is harder for her to hear the radio or the TV or the still conversations, though she has not gone completely deaf on either of the ears .  She denies recent URI, fullness sensation in the ears, drainage, ear pain . vertigo/dizziness mostly associated with positional changes, standing up, for past several months.  Also ringing in both ears, harder to hear conversation and TV.  Of note, patient was restarted on lisinopril in June 2023, but this medication was not associated with increased intensity of her chronic tinnitus.  On exam, onfocal neurologic exam, cerumen visible on ear canal bilaterally. TM visualized; no bulging, fullness, fluid or discharge noted bilaterally. No dizziness or increased tinnitus with head position changes. Patient able to follow commands with normal voice tone.patient's gait and balance were assessed, normal. Overall it is unclear what the etiology of this chronic tinnitus is.  At this time do not suspect at vascular etiology.  There was not dizziness or vertigo elicited on my exam today.  Suspect that this is likely in the setting of progressive hearing loss.  Patient will benefit from ENT follow-up and audiology testing.  However, this is not an option at this time as patient does not have insurance and cannot pay out-of-pocket -Considered audiology testing once patient is insured/obtains orange card

## 2022-08-12 NOTE — Progress Notes (Addendum)
Internal Medicine Clinic Attending  I saw and evaluated the patient.  I personally confirmed the key portions of the history and exam documented by Dr. Gomez and I reviewed pertinent patient test results.  The assessment, diagnosis, and plan were formulated together and I agree with the documentation in the resident's note.  

## 2022-08-12 NOTE — Assessment & Plan Note (Signed)
Patient returns to clinic with chronic issue.  Middle left trigger finger is flaring up today.  Patient with 6 out of 10 pain with ROM.  Endorses no alcohol standing catching and clicking.  Patient tries to straighten her finger, but endorses pain.  Patient was directed to return to clinic within the next week for steroid injection with Dr. Teena Dunk or Mikey Bussing.  Patient received information for conservative therapy and over-the-counter analgesics to manage pain until she can be seen again.

## 2022-08-13 ENCOUNTER — Telehealth: Payer: Self-pay | Admitting: *Deleted

## 2022-08-13 NOTE — Chronic Care Management (AMB) (Signed)
  Care Coordination   Note   08/13/2022 Name: Amy Norris MRN: [id number] DOB: 1966-01-19  [name] is a 56 y.o. year old female who sees Masters, [place], DO for primary care. I reached out to [employer] by phone today to offer care coordination services.  [name] was given information about Care Coordination services today including:   The Care Coordination services include support from the care team which includes your Nurse Coordinator, Clinical Social Worker, or Pharmacist.  The Care Coordination team is here to help remove barriers to the health concerns and goals most important to you. Care Coordination services are voluntary, and the patient may decline or stop services at any time by request to their care team member.   Care Coordination Consent Status: Patient agreed to services and verbal consent obtained.   Follow up plan:  Telephone appointment with care coordination team member scheduled for:  08/14/22  Encounter Outcome:  Pt. Scheduled  [facility] Coordination Care Guide  Direct Dial: [phone]

## 2022-08-14 ENCOUNTER — Ambulatory Visit: Payer: Self-pay | Admitting: Licensed Clinical Social Worker

## 2022-08-14 ENCOUNTER — Encounter: Payer: Self-pay | Admitting: Licensed Clinical Social Worker

## 2022-08-14 NOTE — Patient Outreach (Signed)
  Care Coordination   08/14/2022 Name: [name] MRN: [id number] DOB: August 12, 1966   Care Coordination Outreach Attempts:  An unsuccessful telephone outreach was attempted today to offer the patient information about available care coordination services as a benefit of their health plan.   Follow Up Plan:  Additional outreach attempts will be made to offer the patient care coordination information and services.   Encounter Outcome:  No Answer  Care Coordination Interventions Activated:  Yes   Care Coordination Interventions:  No, not indicated    Ander Gaster, MSW  Social Worker [facility] Care Management  [phone]

## 2022-08-21 ENCOUNTER — Ambulatory Visit (INDEPENDENT_AMBULATORY_CARE_PROVIDER_SITE_OTHER): Payer: Self-pay | Admitting: Student

## 2022-08-21 ENCOUNTER — Encounter: Payer: Self-pay | Admitting: Student

## 2022-08-21 DIAGNOSIS — M6530 Trigger finger, unspecified finger: Secondary | ICD-10-CM

## 2022-08-21 DIAGNOSIS — Z794 Long term (current) use of insulin: Secondary | ICD-10-CM

## 2022-08-21 DIAGNOSIS — M65332 Trigger finger, left middle finger: Secondary | ICD-10-CM

## 2022-08-21 DIAGNOSIS — Z139 Encounter for screening, unspecified: Secondary | ICD-10-CM

## 2022-08-21 DIAGNOSIS — E1142 Type 2 diabetes mellitus with diabetic polyneuropathy: Secondary | ICD-10-CM

## 2022-08-21 NOTE — Assessment & Plan Note (Addendum)
Patient initially present in clinic for tenosynovitis/ L middle trigger finger injection. Patient remains symptomatic, though her pain is decreased since last OV. Unable to receive steroid injection today in setting of uncontrolled BG and lack of diabetes medication access. -Return in one week; if BG is controlled, consider injection then
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
## 2022-08-22 NOTE — Progress Notes (Signed)
Internal Medicine Clinic Attending  I saw and evaluated the patient.  I personally confirmed the key portions of the history and exam documented by Dr. Gomez-Caraballo and I reviewed pertinent patient test results.  The assessment, diagnosis, and plan were formulated together and I agree with the documentation in the resident's note.  

## 2022-09-03 ENCOUNTER — Telehealth: Payer: Self-pay | Admitting: Internal Medicine

## 2022-09-03 NOTE — Telephone Encounter (Signed)
Returned call to patient. C/o dizziness, nausea, and heartburn x 2-3 days. Patient has hx of  postural dizziness with presyncope but states it doesn't usually last this long. BP this AM 123/76 P 73. CBGs High 100s to 200s. Had one BP last week 200s/100s. She was given first available appt for 9/7 at 1:30. She is instructed to head to UC/ED if dizziness worsens or BP goes back to 200s. She agrees.

## 2022-09-03 NOTE — Telephone Encounter (Signed)
Pt requesting a call back.- Pt feeling light headed and dizzy x 2- 3 days. Pt also reports bp has been going up and down as well.

## 2022-09-05 ENCOUNTER — Emergency Department (HOSPITAL_COMMUNITY)
Admission: EM | Admit: 2022-09-05 | Discharge: 2022-09-05 | Discharge status: Against Medical Advice | Payer: No Typology Code available for payment source | Attending: Student | Admitting: Student

## 2022-09-05 ENCOUNTER — Emergency Department (HOSPITAL_COMMUNITY): Payer: No Typology Code available for payment source

## 2022-09-05 ENCOUNTER — Ambulatory Visit (INDEPENDENT_AMBULATORY_CARE_PROVIDER_SITE_OTHER): Payer: Self-pay

## 2022-09-05 ENCOUNTER — Other Ambulatory Visit: Payer: Self-pay

## 2022-09-05 VITALS — BP 135/67 | HR 81 | Temp 98.5°F | Ht 66.0 in | Wt 225.8 lb

## 2022-09-05 DIAGNOSIS — R42 Dizziness and giddiness: Secondary | ICD-10-CM | POA: Insufficient documentation

## 2022-09-05 DIAGNOSIS — R531 Weakness: Secondary | ICD-10-CM | POA: Insufficient documentation

## 2022-09-05 DIAGNOSIS — Z5321 Procedure and treatment not carried out due to patient leaving prior to being seen by health care provider: Secondary | ICD-10-CM | POA: Insufficient documentation

## 2022-09-05 DIAGNOSIS — Z20822 Contact with and (suspected) exposure to covid-19: Secondary | ICD-10-CM | POA: Insufficient documentation

## 2022-09-05 LAB — RAPID URINE DRUG SCREEN, HOSP PERFORMED
Amphetamines: NOT DETECTED
Barbiturates: NOT DETECTED
Benzodiazepines: NOT DETECTED
Cocaine: NOT DETECTED
Opiates: NOT DETECTED
Tetrahydrocannabinol: NOT DETECTED

## 2022-09-05 LAB — COMPREHENSIVE METABOLIC PANEL
ALT: 15 U/L (ref 0–44)
AST: 31 U/L (ref 15–41)
Albumin: 3.4 g/dL — ABNORMAL LOW (ref 3.5–5.0)
Alkaline Phosphatase: 52 U/L (ref 38–126)
Anion gap: 3 — ABNORMAL LOW (ref 5–15)
BUN: 10 mg/dL (ref 6–20)
CO2: 25 mmol/L (ref 22–32)
Calcium: 8.4 mg/dL — ABNORMAL LOW (ref 8.9–10.3)
Chloride: 111 mmol/L (ref 98–111)
Creatinine, Ser: 0.54 mg/dL (ref 0.44–1.00)
GFR, Estimated: >60 mL/min (ref >60)
Glucose, Bld: 97 mg/dL (ref 70–99)
Potassium: 3.7 mmol/L (ref 3.5–5.1)
Sodium: 139 mmol/L (ref 135–145)
Total Bilirubin: 0.3 mg/dL (ref 0.3–1.2)
Total Protein: 6.3 g/dL — ABNORMAL LOW (ref 6.5–8.1)

## 2022-09-05 LAB — DIFFERENTIAL
Abs Immature Granulocytes: 0.03 10*3/uL (ref 0.00–0.07)
Basophils Absolute: 0 10*3/uL (ref 0.0–0.1)
Basophils Relative: 0 %
Eosinophils Absolute: 0 10*3/uL (ref 0.0–0.5)
Eosinophils Relative: 0 %
Immature Granulocytes: 0 %
Lymphocytes Relative: 22 %
Lymphs Abs: 1.9 10*3/uL (ref 0.7–4.0)
Monocytes Absolute: 0.6 10*3/uL (ref 0.1–1.0)
Monocytes Relative: 7 %
Neutro Abs: 6.1 10*3/uL (ref 1.7–7.7)
Neutrophils Relative %: 71 %

## 2022-09-05 LAB — I-STAT CHEM 8, ED
BUN: 9 mg/dL (ref 6–20)
Calcium, Ion: 1.08 mmol/L — ABNORMAL LOW (ref 1.15–1.40)
Chloride: 105 mmol/L (ref 98–111)
Creatinine, Ser: 0.5 mg/dL (ref 0.44–1.00)
Glucose, Bld: 88 mg/dL (ref 70–99)
HCT: 41 % (ref 36.0–46.0)
Hemoglobin: 13.9 g/dL (ref 12.0–15.0)
Potassium: 3.6 mmol/L (ref 3.5–5.1)
Sodium: 141 mmol/L (ref 135–145)
TCO2: 28 mmol/L (ref 22–32)

## 2022-09-05 LAB — RESP PANEL BY RT-PCR (FLU A&B, COVID) ARPGX2
Influenza A by PCR: NEGATIVE
Influenza B by PCR: NEGATIVE
SARS Coronavirus 2 by RT PCR: NEGATIVE

## 2022-09-05 LAB — URINALYSIS, ROUTINE W REFLEX MICROSCOPIC
Bilirubin Urine: NEGATIVE
Glucose, UA: 50 mg/dL — AB
Hgb urine dipstick: NEGATIVE
Ketones, ur: NEGATIVE mg/dL
Nitrite: NEGATIVE
Protein, ur: NEGATIVE mg/dL
Specific Gravity, Urine: 1.016 (ref 1.005–1.030)
pH: 6 (ref 5.0–8.0)

## 2022-09-05 LAB — CBC
HCT: 42 % (ref 36.0–46.0)
Hemoglobin: 13.9 g/dL (ref 12.0–15.0)
MCH: 29.7 pg (ref 26.0–34.0)
MCHC: 33.1 g/dL (ref 30.0–36.0)
MCV: 89.7 fL (ref 80.0–100.0)
Platelets: 308 10*3/uL (ref 150–400)
RBC: 4.68 MIL/uL (ref 3.87–5.11)
RDW: 13 % (ref 11.5–15.5)
WBC: 8.7 10*3/uL (ref 4.0–10.5)
nRBC: 0 % (ref 0.0–0.2)

## 2022-09-05 LAB — I-STAT BETA HCG BLOOD, ED (MC, WL, AP ONLY): I-stat hCG, quantitative: <5 m[IU]/mL (ref <5)

## 2022-09-05 LAB — PROTIME-INR
INR: 1 (ref 0.8–1.2)
Prothrombin Time: 13.5 seconds (ref 11.4–15.2)

## 2022-09-05 LAB — APTT: aPTT: 29 seconds (ref 24–36)

## 2022-09-05 NOTE — Assessment & Plan Note (Signed)
Patient has a history of postural dizziness due to hypoglycemia says that her current episode feels different from others.  She started feeling dizzy last weekend after 1 episode of nonbloody nonbilious vomiting.  She has not had any other episodes of vomiting, increased urination, or diarrhea.  Currently, she says that she feels dizzy all the time even when resting or lying down with eyes closed.  This does worsen with positional changes, but is not alleviated by anything.  She has not had any falls or loss of consciousness.  No new numbness or tingling or weakness in any extremities.  No recent viral illnesses, fevers.  Vitals today are not orthostatic.  Notable central findings on hints exam with absence of saccade on horizontal impulse, bidirectional nystagmus, possible vertical skew as well as possible ataxic gait.  No other focal neurological deficits.  Since symptoms started on Sunday, patient would be out of window for tPA, but concern for infarct warrants ED evaluation with stroke imaging.  Sending patient directly to the ED from clinic.

## 2022-09-05 NOTE — ED Provider Triage Note (Signed)
Emergency Medicine Provider Triage Evaluation Note  Amy Norris , a 56 y.o. female  was evaluated in triage.  Pt complains of dizziness.  Last known well 4 days ago.  Patient is unable to ambulate secondary to dizziness.  Denies chest pain or shortness of breath  Review of Systems  Per HPI  Physical Exam  BP 126/74 (BP Location: Right Arm)   Pulse 80   Temp 98.7 F (37.1 C) (Oral)   Resp 16   LMP 10/02/2020 (Approximate)   SpO2 96%  Gen:   Awake, no distress   Resp:  Normal effort MSK:   Moves extremities without difficulty  Other:  Mild nystagmus horizontal.  Cranial nerves II through XII are grossly intact, lower extremity status cool bilaterally.  Grip strength is full bilaterally.  Gait testing deferred.  Medical Decision Making  Medically screening exam initiated at 3:38 PM.  Appropriate orders placed.  Amy Norris was informed that the remainder of the evaluation will be completed by another provider, this initial triage assessment does not replace that evaluation, and the importance of remaining in the ED until their evaluation is complete.     Theron Arista, PA-C 09/05/22 1539

## 2022-09-05 NOTE — ED Triage Notes (Signed)
Pt sent here by internal medicine for possible stroke. Pt stated she went in for dizziness, lightheadedness and "not feeling good" since Sunday. Pt denies unilateral weakness/numbness, but states she has had some difficulty walking.

## 2022-09-05 NOTE — Progress Notes (Signed)
   CC: dizziness  HPI:  Ms.Janiene P Peaden is a 56 y.o.-year-old female with past medical history as below presenting for dizziness.  Please see encounters tab for problem-based charting.  Past Medical History:  Diagnosis Date   Anal fissure    Breast mass    Bronchitis    Carpal tunnel syndrome, bilateral    Cholelithiasis    Depression    Diabetes mellitus    Type II   Essential hypertension    GERD (gastroesophageal reflux disease)    Herpes simplex type 1 infection    vaginal   Hypokalemia    2/2 diarrhea   Menorrhagia    Obesity    Polycystic ovarian disease    Renal calculi    Renal calculus    Sleep apnea    no cpap   Sleep apnea, obstructive    Review of Systems: As in HPI.  Please see encounters tab for problem is charting. Marland Kitchen  Physical Exam:  Vitals:   09/05/22 1342  BP: 135/67  Pulse: 81  Temp: 98.5 F (36.9 C)  TempSrc: Oral  SpO2: 97%  Weight: 225 lb 12.8 oz (102.4 kg)  Height: 5\' 6"  (1.676 m)   General:Well-appearing, pleasant, In NAD Cardiac: RRR, no murmurs rubs or gallops. Respiratory: Normal work of breathing on room air, CTA B Abdominal: Soft, nontender, nondistended Neuro: Ataxic gait, HINTS exam revealing multiple central findings including bidirectional nystagmus, no saccade, possible left-sided vertical skew.  Finger-nose, heel-shin, rapid alternating movements normal.  5/5 strength in all extremities.  Assessment & Plan:   #Dizziness: Patient has a history of postural dizziness due to hypoglycemia says that her current episode feels different from others.  She started feeling dizzy last weekend after 1 episode of nonbloody nonbilious vomiting.  She has not had any other episodes of vomiting, increased urination, or diarrhea.  Currently, she says that she feels dizzy all the time even when resting or lying down with eyes closed.  This does worsen with positional changes, but is not alleviated by anything.  She has not had any falls or  loss of consciousness.  No new numbness or tingling or weakness in any extremities.  No recent viral illnesses, fevers.  Vitals today are not orthostatic.  Notable central findings on hints exam with absence of saccade on horizontal impulse, bidirectional nystagmus, possible vertical skew as well as possible ataxic gait.  No other focal neurological deficits.  Since symptoms started on Sunday, patient would be out of window for tPA, but concern for infarct warrants ED evaluation with stroke imaging.  Sending patient directly to the ED from clinic.   Patient seen with Dr.  Saturday

## 2022-09-06 ENCOUNTER — Telehealth: Payer: Self-pay | Admitting: *Deleted

## 2022-09-06 NOTE — Telephone Encounter (Signed)
Call from patient requesting results of CT that was done in the ER.  Left prior to getting results.

## 2022-09-13 NOTE — Progress Notes (Signed)
Internal Medicine Clinic Attending  I saw and evaluated the patient.  I personally confirmed the key portions of the history and exam documented by Dr. Marjie Skiff and I reviewed pertinent patient test results.  The assessment, diagnosis, and plan were formulated together and I agree with the documentation in the resident's note. Amy Norris presented with concern for acute vestibular syndrome and positive HINTS exam. We recommend ED presentation to rule out central cause.

## 2022-10-03 ENCOUNTER — Other Ambulatory Visit (HOSPITAL_COMMUNITY): Payer: Self-pay

## 2022-10-03 ENCOUNTER — Other Ambulatory Visit: Payer: Self-pay | Admitting: Internal Medicine

## 2022-10-04 NOTE — Telephone Encounter (Signed)
Next appt scheduled 10/10 with Dr Elliot Gurney.

## 2022-10-07 ENCOUNTER — Other Ambulatory Visit (HOSPITAL_COMMUNITY): Payer: Self-pay

## 2022-10-07 MED ORDER — UNIFINE PENTIPS 31G X 6 MM MISC
2 refills | Status: DC
  Filled 2022-10-07: qty 100, 100d supply, fill #0
  Filled 2023-01-30: qty 100, 100d supply, fill #1

## 2022-10-08 ENCOUNTER — Ambulatory Visit (INDEPENDENT_AMBULATORY_CARE_PROVIDER_SITE_OTHER): Payer: Self-pay | Admitting: Internal Medicine

## 2022-10-08 VITALS — BP 121/65 | HR 76 | Temp 97.8°F | Ht 66.0 in | Wt 223.0 lb

## 2022-10-08 DIAGNOSIS — M6530 Trigger finger, unspecified finger: Secondary | ICD-10-CM

## 2022-10-08 DIAGNOSIS — Z23 Encounter for immunization: Secondary | ICD-10-CM

## 2022-10-08 DIAGNOSIS — E1142 Type 2 diabetes mellitus with diabetic polyneuropathy: Secondary | ICD-10-CM

## 2022-10-08 LAB — GLUCOSE, CAPILLARY: Glucose-Capillary: 161 mg/dL — ABNORMAL HIGH (ref 70–99)

## 2022-10-08 NOTE — Progress Notes (Signed)
   CC: trigger finger injection  HPI:[name] is a 56 y.o. female who presents for evaluation of trigger finger. Please see individual problem based A/P for details.  Presenting for steroid injection for trigger finger. Has had two injection previously in 2022 which provided relief for approx. 1 month each time. Reports she is beginning to have trouble writing. Was told at last visit that cbg needed better control before injection. Has not seen ortho due to lack of insurance. Has orange card paperwork, but not submitted yet.    Depression, PHQ-9: Based on the patients  [place] Visit from 04/29/2022 in [place]  PHQ-9 Total Score 2      score we have .  Past Medical History:  Diagnosis Date   Anal fissure    Breast mass    Bronchitis    Carpal tunnel syndrome, bilateral    Cholelithiasis    Depression    Diabetes mellitus    Type II   Essential hypertension    GERD (gastroesophageal reflux disease)    Herpes simplex type 1 infection    vaginal   Hypokalemia    2/2 diarrhea   Menorrhagia    Obesity    Polycystic ovarian disease    Postural dizziness with presyncope 11/29/2013   Renal calculi    Renal calculus    Sleep apnea    no cpap   Sleep apnea, obstructive    Review of Systems:   See HPI  Physical Exam: Vitals:   10/08/22 0913  BP: 121/65  Pulse: 76  Temp: 97.8 F (36.6 C)  TempSrc: Oral  SpO2: 98%  Weight: 223 lb (101.2 kg)  Height: 5\' 6"  (1.676 m)     General: NAD HEENT: Conjunctiva nl , antiicteric sclerae, moist mucous membranes, no exudate or erythema Cardiovascular: Normal rate, regular rhythm.  No murmurs, rubs, or gallops Pulmonary : Equal breath sounds, No wheezes, rales, or rhonchi Abdominal: soft, nontender,  bowel sounds present Ext: Palpable node on palmar surface of tendon for left 3rd finger. Visible locking with finger extension. Minimal tenderness to palpation.  Assessment & Plan:    See Encounters Tab for problem based charting.  Performed steroid injection but not able to inject full amount due to narrow tendon sheath. Would not perform anymore joint injections due to concern for tendon rupture. Would benefit from orthopedic referral for permanent correction.  Patient seen with Dr. Evette Doffing

## 2022-10-08 NOTE — Patient Instructions (Signed)
Dear Amy Norris,  Thank you for trusting [place] with your care today. We performed a finger injection on your. We were able to inject a small amount of the steroid. A hand surgeon will be able to offer the best treatment for this. Please complete the orange card paperwork so that we can place that referral. We will see you back in 3 months for a diabetes follow up.

## 2022-10-10 NOTE — Assessment & Plan Note (Addendum)
Presenting for steroid injection for trigger finger. Has had two injection previously in 2022 which provided relief for approx. 1 month each time. Reports she is beginning to have trouble writing. Was told at last visit that cbg needed better control before injection. Has not seen ortho due to lack of insurance. Has orange card paperwork, but not submitted yet.   Palpable node on palmar surface of tendon for 3rd finger. Visible locking with finger extension. Minimal tenderness to palpation.   Performed steroid injection but not able to inject full amount due to narrow tendon sheath. Would not perform anymore joint injections due to concern for tendon rupture. Would benefit from orthopedic referral for permanent correction once she has completed orange card paperwork.

## 2022-10-12 ENCOUNTER — Encounter: Payer: Self-pay | Admitting: Internal Medicine

## 2022-10-14 NOTE — Progress Notes (Signed)
Internal Medicine Clinic Attending  I saw and evaluated the patient.  I personally confirmed the key portions of the history and exam documented by Dr. Elliot Gurney and I reviewed pertinent patient test results.  The assessment, diagnosis, and plan were formulated together and I agree with the documentation in the resident's note.   Attempted injection for tenosynovitis, but could not adequately access the tendon sheath after three attempts. The procedure was terminated at that point. She has had only incomplete response to the last two injections to this trigger finger. I strongly encouraged her to find a way to access hand surgery for definitive treatment. I worry repeat steroid injections will put her at risk for tendon rupture.

## 2022-10-14 NOTE — Addendum Note (Signed)
Addended by: Lalla Brothers T on: 10/14/2022 08:09 AM   Modules accepted: Level of Service

## 2022-10-21 ENCOUNTER — Encounter: Payer: No Typology Code available for payment source | Admitting: Internal Medicine

## 2022-10-31 ENCOUNTER — Other Ambulatory Visit: Payer: Self-pay

## 2022-10-31 ENCOUNTER — Other Ambulatory Visit (HOSPITAL_COMMUNITY): Payer: Self-pay

## 2022-11-04 ENCOUNTER — Telehealth: Payer: Self-pay | Admitting: *Deleted

## 2022-11-04 ENCOUNTER — Other Ambulatory Visit (HOSPITAL_COMMUNITY): Payer: Self-pay

## 2022-11-04 DIAGNOSIS — T753XXA Motion sickness, initial encounter: Secondary | ICD-10-CM

## 2022-11-04 MED ORDER — SCOPOLAMINE 1 MG/3DAYS TD PT72
1.0000 | MEDICATED_PATCH | TRANSDERMAL | 0 refills | Status: DC
  Filled 2022-11-04: qty 4, 12d supply, fill #0
  Filled 2022-12-30 – 2023-01-30 (×2): qty 10, 30d supply, fill #0

## 2022-11-04 NOTE — Telephone Encounter (Signed)
Patient calling in requesting anti-nausea patches for upcoming cruise on 11/19. States this is her first time and she is unsure if she will need them but would like them just in case.

## 2022-11-04 NOTE — Telephone Encounter (Signed)
Scopolamine patch sent to [facility]. 10 patches sent

## 2022-11-05 ENCOUNTER — Other Ambulatory Visit (HOSPITAL_COMMUNITY): Payer: Self-pay

## 2022-12-02 ENCOUNTER — Other Ambulatory Visit: Payer: Self-pay | Admitting: Student

## 2022-12-02 ENCOUNTER — Other Ambulatory Visit (HOSPITAL_COMMUNITY): Payer: Self-pay

## 2022-12-02 DIAGNOSIS — F320 Major depressive disorder, single episode, mild: Secondary | ICD-10-CM

## 2022-12-02 MED ORDER — FLUOXETINE HCL 40 MG PO CAPS
40.0000 mg | ORAL_CAPSULE | Freq: Every day | ORAL | 3 refills | Status: DC
  Filled 2022-12-02 – 2022-12-30 (×2): qty 30, 30d supply, fill #0
  Filled 2023-01-30: qty 30, 30d supply, fill #1
  Filled 2023-02-28 – 2023-04-02 (×3): qty 30, 30d supply, fill #2
  Filled 2023-05-02: qty 30, 30d supply, fill #3
  Filled 2023-05-30: qty 30, 30d supply, fill #4
  Filled 2023-06-30 – 2023-07-24 (×2): qty 30, 30d supply, fill #5
  Filled 2023-08-29: qty 30, 30d supply, fill #6
  Filled 2023-09-28: qty 30, 30d supply, fill #7
  Filled 2023-10-27: qty 30, 30d supply, fill #8
  Filled 2023-11-25: qty 30, 30d supply, fill #9

## 2022-12-11 ENCOUNTER — Other Ambulatory Visit (HOSPITAL_COMMUNITY): Payer: Self-pay

## 2022-12-13 ENCOUNTER — Ambulatory Visit: Payer: Self-pay | Admitting: Licensed Clinical Social Worker

## 2022-12-13 NOTE — Patient Outreach (Signed)
SW removed from Care Team.  Rikayla Demmon, BSW, MSW, LCSW-A  Social Worker [facility] Care Management  [phone] 

## 2022-12-31 ENCOUNTER — Other Ambulatory Visit: Payer: Self-pay

## 2022-12-31 ENCOUNTER — Other Ambulatory Visit (HOSPITAL_COMMUNITY): Payer: Self-pay

## 2023-01-30 ENCOUNTER — Other Ambulatory Visit: Payer: Self-pay

## 2023-01-30 ENCOUNTER — Other Ambulatory Visit (HOSPITAL_COMMUNITY): Payer: Self-pay

## 2023-01-31 ENCOUNTER — Other Ambulatory Visit (HOSPITAL_COMMUNITY): Payer: Self-pay

## 2023-01-31 ENCOUNTER — Other Ambulatory Visit: Payer: Self-pay

## 2023-02-03 ENCOUNTER — Other Ambulatory Visit (HOSPITAL_COMMUNITY): Payer: Self-pay

## 2023-02-28 ENCOUNTER — Other Ambulatory Visit: Payer: Self-pay

## 2023-02-28 ENCOUNTER — Other Ambulatory Visit (HOSPITAL_COMMUNITY): Payer: Self-pay

## 2023-03-12 ENCOUNTER — Encounter: Payer: Self-pay | Admitting: Student

## 2023-03-12 ENCOUNTER — Ambulatory Visit (INDEPENDENT_AMBULATORY_CARE_PROVIDER_SITE_OTHER): Payer: Self-pay | Admitting: Student

## 2023-03-12 ENCOUNTER — Other Ambulatory Visit: Payer: Self-pay

## 2023-03-12 ENCOUNTER — Other Ambulatory Visit (HOSPITAL_COMMUNITY): Payer: Self-pay

## 2023-03-12 VITALS — BP 119/58 | HR 79 | Temp 98.2°F | Ht 66.0 in | Wt 235.4 lb

## 2023-03-12 DIAGNOSIS — E1142 Type 2 diabetes mellitus with diabetic polyneuropathy: Secondary | ICD-10-CM

## 2023-03-12 DIAGNOSIS — Z794 Long term (current) use of insulin: Secondary | ICD-10-CM

## 2023-03-12 DIAGNOSIS — F3340 Major depressive disorder, recurrent, in remission, unspecified: Secondary | ICD-10-CM

## 2023-03-12 DIAGNOSIS — I10 Essential (primary) hypertension: Secondary | ICD-10-CM

## 2023-03-12 DIAGNOSIS — Z7984 Long term (current) use of oral hypoglycemic drugs: Secondary | ICD-10-CM

## 2023-03-12 LAB — POCT GLYCOSYLATED HEMOGLOBIN (HGB A1C): Hemoglobin A1C: 7.8 % — AB (ref 4.0–5.6)

## 2023-03-12 LAB — GLUCOSE, CAPILLARY: Glucose-Capillary: 172 mg/dL — ABNORMAL HIGH (ref 70–99)

## 2023-03-12 MED ORDER — DAPAGLIFLOZIN PROPANEDIOL 5 MG PO TABS
10.0000 mg | ORAL_TABLET | Freq: Every day | ORAL | 2 refills | Status: DC
  Filled 2023-03-12 – 2023-04-02 (×2): qty 90, 45d supply, fill #0

## 2023-03-12 NOTE — Patient Instructions (Addendum)
It was a pleasure seeing you in clinic today  Please increase your farxiga to 10 mg daily Continue your Humalog  50 units in the morning and 45 units at night I do not think you will have low blood sugars on this Take rybelsus 7 mg daily until you can refill victoza and countinue 1.8 mg daily  Continue your prozac 40 mg daily it appears your mood looks to be doing well on this dose  Please follow up in 1 month

## 2023-03-13 ENCOUNTER — Other Ambulatory Visit (HOSPITAL_COMMUNITY): Payer: Self-pay

## 2023-03-14 MED ORDER — RYBELSUS 7 MG PO TABS: 7.0000 mg | ORAL_TABLET | Freq: Every day | ORAL | 0 refills | Status: DC

## 2023-03-17 ENCOUNTER — Other Ambulatory Visit (HOSPITAL_COMMUNITY): Payer: Self-pay

## 2023-03-17 ENCOUNTER — Other Ambulatory Visit: Payer: Self-pay

## 2023-03-17 MED ORDER — INSULIN PEN NEEDLE 32G X 6 MM MISC
11 refills | Status: DC
  Filled 2023-03-17 – 2023-05-02 (×4): qty 100, 20d supply, fill #0
  Filled 2023-08-29 (×2): qty 500, 90d supply, fill #0
  Filled 2023-09-28 – 2024-03-02 (×9): qty 500, 90d supply, fill #1

## 2023-03-17 MED ORDER — INSULIN PEN NEEDLE 32G X 6 MM MISC: 11 refills | Status: DC

## 2023-03-17 MED ORDER — VICTOZA 18 MG/3ML ~~LOC~~ SOPN
1.8000 mg | PEN_INJECTOR | Freq: Every day | SUBCUTANEOUS | 5 refills | Status: DC
  Filled 2023-03-17 – 2023-04-02 (×2): qty 9, 30d supply, fill #0
  Filled 2023-05-02: qty 9, 30d supply, fill #1
  Filled 2023-05-30: qty 9, 30d supply, fill #2
  Filled 2023-06-30: qty 9, 30d supply, fill #3

## 2023-03-17 NOTE — Progress Notes (Signed)
Established Patient Office Visit  Subjective   Patient ID: Amy Norris, female    DOB: 1966-10-11  Age: 57 y.o. MRN: [id number]  Chief Complaint  Patient presents with   Follow-up   Muscle Pain    Right lower back.    Amy Norris is a 57 y.o. person living with a history listed below who presents to clinic for diabetes follow up. Please refer to problem based charting for further details and assessment and plan of current problem and chronic medical conditions.     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 07/14/2020   Breast lump on right side at 1 o'clock position 05/16/2020   Chronic diarrhea 03/09/2020   Pulmonary nodule 02/12/2019   Osteoarthritis 05/12/2018   Diabetic polyneuropathy ([place]) 02/02/2017   Trigger finger 02/04/2014   Dizziness 11/29/2013   Hypercholesteremia 11/05/2012   Cystitis 04/10/2012   Hypertension 01/14/2008   Type 2 diabetes mellitus with peripheral neuropathy ([place]) 12/04/2006   Depression, major, recurrent, in remission ([place]) 12/04/2006   SLEEP APNEA, OBSTRUCTIVE 12/04/2006   GERD 12/04/2006   ROS: negative as per HPI     Objective:     BP (!) 119/58 (BP Location: Right Arm, Patient Position: Sitting, Cuff Size: Large)   Pulse 79   Temp 98.2 F (36.8 C) (Oral)   Ht 5\' 6"  (1.676 m)   Wt 235 lb 6.4 oz (106.8 kg)   LMP 10/02/2020 (Approximate)   SpO2 97% Comment: RA  BMI 37.99 kg/m  BP Readings from Last 3 Encounters:  03/12/23 (!) 119/58  10/08/22 121/65  09/05/22 133/68      Physical Exam Constitutional:      General: She is not in acute distress.    Appearance: Normal appearance. She is obese.  HENT:     Head: Normocephalic and atraumatic.     Mouth/Throat:     Mouth: Mucous  membranes are moist.     Pharynx: Oropharynx is clear.  Eyes:     Extraocular Movements: Extraocular movements intact.     Conjunctiva/sclera: Conjunctivae normal.     Pupils: Pupils are equal, round, and reactive to light.  Cardiovascular:     Rate and Rhythm: Normal rate and regular rhythm.  Pulmonary:     Effort: Pulmonary effort is normal.     Breath sounds: No rhonchi or rales.  Abdominal:     General: Abdomen is flat. Bowel sounds are normal. There is no distension.     Palpations: Abdomen is soft.     Tenderness: There is no abdominal tenderness.  Musculoskeletal:        General: Normal range of motion.     Right lower leg: No edema.     Left lower leg: No edema.  Skin:    General: Skin is warm and dry.     Capillary Refill: Capillary refill takes less than 2 seconds.     Findings: No lesion or rash.  Neurological:     General: No focal deficit present.     Mental Status: She is alert and oriented to person, place, and time.  Psychiatric:        Mood and Affect: Mood normal.        Behavior: Behavior normal.      Results for  orders placed or performed in visit on 03/12/23  Glucose, capillary  Result Value Ref Range   Glucose-Capillary 172 (H) 70 - 99 mg/dL  POC Hbg A1C  Result Value Ref Range   Hemoglobin A1C 7.8 (A) 4.0 - 5.6 %   HbA1c POC (<> result, manual entry)     HbA1c, POC (prediabetic range)     HbA1c, POC (controlled diabetic range)      Last hemoglobin A1c Lab Results  Component Value Date   HGBA1C 7.8 (A) 03/12/2023      The 10-year ASCVD risk score (Arnett DK, et al., 2019) is: 4.8%    Assessment & Plan:   Problem List Items Addressed This Visit       Cardiovascular and Mediastinum   Hypertension (Chronic)    BP improved to 119/58.  Is taking lisinopril 10 mg daily.  Kidney labs in September were within normal limits.  Will repeat BMP at her next visit.        Endocrine   Type 2 diabetes mellitus with peripheral neuropathy (HCC) -  Primary (Chronic)    A1c today is 7.8%.  Is using her Humalog 75/25 mix is prescribed as 50 units in the the morning and 45 units with supper.  She is only using 45 units twice daily.  Is concerned she may have lows if we increase her insulin in the morning time.  Looking at her meter data fasting glucoses generally between 190 and 250, and evening glucoses typically also in the 2.  No evidence she is having low blood sugars.  Do not think she would develop hypoglycemia in the morning insulin.  Reports housing situation is more stable now and they have a new place to live.  She is unable to afford new medications April 1.  She does have Farxiga at home as well as insulin to last her until then.  She has not been able to fill her Victoza.  Increase Humalog to 50 units in the morning and 45 units in the evenings. Continue Farxiga 10 mg daily Sample given for Rybelsus 7 mg daily until she can pick up her Victoza Continue Victoza 1.8 mg daily. Would likely benefit from switching to long-acting and mealtime insulin with Lantus and NovoLog which she should also be able to get at a discounted rate from the [facility].      Relevant Medications   dapagliflozin propanediol (FARXIGA) 5 MG TABS tablet   Semaglutide (RYBELSUS) 7 MG TABS   liraglutide (VICTOZA) 18 MG/3ML SOPN   Other Relevant Orders   POC Hbg A1C (Completed)   Diabetic polyneuropathy (HCC)   Relevant Medications   dapagliflozin propanediol (FARXIGA) 5 MG TABS tablet   Semaglutide (RYBELSUS) 7 MG TABS   liraglutide (VICTOZA) 18 MG/3ML SOPN   Insulin Pen Needle 32G X 6 MM MISC     Other   Depression, major, recurrent, in remission (HCC) (Chronic)    PHQ-9 of 0 today.  Suspect her mood may be related to acute stressors of housing instability which seems to have improved now.  However given history of depression may need lifelong SSRI to prevent recurrent relapses.  Continue Prozac 40 mg daily.       Return in about 4 weeks  (around 04/09/2023).    Amy Beard, MD

## 2023-03-17 NOTE — Assessment & Plan Note (Signed)
BP improved to 119/58.  Is taking lisinopril 10 mg daily.  Kidney labs in September were within normal limits.  Will repeat BMP at her next visit.

## 2023-03-17 NOTE — Assessment & Plan Note (Addendum)
PHQ-9 of 0 today.  Suspect her mood may be related to acute stressors of housing instability which seems to have improved now.  However given history of depression may need lifelong SSRI to prevent recurrent relapses.  Continue Prozac 40 mg daily.

## 2023-03-17 NOTE — Assessment & Plan Note (Signed)
A1c today is 7.8%.  Is using her Humalog 75/25 mix is prescribed as 50 units in the the morning and 45 units with supper.  She is only using 45 units twice daily.  Is concerned she may have lows if we increase her insulin in the morning time.  Looking at her meter data fasting glucoses generally between 190 and 250, and evening glucoses typically also in the 2.  No evidence she is having low blood sugars.  Do not think she would develop hypoglycemia in the morning insulin.  Reports housing situation is more stable now and they have a new place to live.  She is unable to afford new medications April 1.  She does have Farxiga at home as well as insulin to last her until then.  She has not been able to fill her Victoza.  Increase Humalog to 50 units in the morning and 45 units in the evenings. Continue Farxiga 10 mg daily Sample given for Rybelsus 7 mg daily until she can pick up her Victoza Continue Victoza 1.8 mg daily. Would likely benefit from switching to long-acting and mealtime insulin with Lantus and NovoLog which she should also be able to get at a discounted rate from the [facility].

## 2023-03-18 NOTE — Progress Notes (Signed)
Internal Medicine Clinic Attending  Case discussed with Dr. Lisabeth Devoid  At the time of the visit.  We reviewed the resident's history and exam and pertinent patient test results.  I agree with the assessment, diagnosis, and plan of care documented in the resident's note. Patient is not able to afford refills of medications until April 1st. Dr. Lisabeth Devoid has provided oral semaglutide sample to provide coverage.

## 2023-03-31 ENCOUNTER — Other Ambulatory Visit (HOSPITAL_COMMUNITY): Payer: Self-pay

## 2023-04-02 ENCOUNTER — Other Ambulatory Visit (HOSPITAL_COMMUNITY): Payer: Self-pay

## 2023-04-02 ENCOUNTER — Other Ambulatory Visit: Payer: Self-pay

## 2023-04-02 ENCOUNTER — Other Ambulatory Visit: Payer: Self-pay | Admitting: Student

## 2023-04-02 DIAGNOSIS — E1142 Type 2 diabetes mellitus with diabetic polyneuropathy: Secondary | ICD-10-CM

## 2023-04-02 MED ORDER — DAPAGLIFLOZIN PROPANEDIOL 10 MG PO TABS
10.0000 mg | ORAL_TABLET | Freq: Every day | ORAL | 11 refills | Status: DC
  Filled 2023-04-02: qty 30, 30d supply, fill #0
  Filled 2023-05-02: qty 30, 30d supply, fill #1
  Filled 2023-05-30: qty 30, 30d supply, fill #2
  Filled 2023-06-30 – 2023-07-24 (×2): qty 30, 30d supply, fill #3
  Filled 2023-08-29 – 2023-09-02 (×2): qty 30, 30d supply, fill #4
  Filled 2023-09-28: qty 30, 30d supply, fill #5
  Filled 2023-10-27: qty 30, 30d supply, fill #6
  Filled 2023-11-25: qty 30, 30d supply, fill #7
  Filled 2023-12-03 – 2023-12-25 (×2): qty 30, 30d supply, fill #8
  Filled 2024-01-28: qty 30, 30d supply, fill #9
  Filled 2024-02-27: qty 30, 30d supply, fill #10
  Filled 2024-03-29: qty 30, 30d supply, fill #11

## 2023-04-03 ENCOUNTER — Other Ambulatory Visit: Payer: Self-pay | Admitting: Internal Medicine

## 2023-04-03 ENCOUNTER — Other Ambulatory Visit (HOSPITAL_COMMUNITY): Payer: Self-pay

## 2023-04-03 DIAGNOSIS — E1142 Type 2 diabetes mellitus with diabetic polyneuropathy: Secondary | ICD-10-CM

## 2023-04-04 ENCOUNTER — Other Ambulatory Visit (HOSPITAL_COMMUNITY): Payer: Self-pay

## 2023-04-04 MED ORDER — INSULIN LISPRO PROT & LISPRO (75-25 MIX) 100 UNIT/ML KWIKPEN
PEN_INJECTOR | SUBCUTANEOUS | 2 refills | Status: DC
  Filled 2023-04-04: qty 30, 32d supply, fill #0
  Filled 2023-05-02: qty 30, 32d supply, fill #1
  Filled 2023-05-30: qty 30, 32d supply, fill #2

## 2023-04-04 MED ORDER — GABAPENTIN 300 MG PO CAPS
300.0000 mg | ORAL_CAPSULE | Freq: Three times a day (TID) | ORAL | 2 refills | Status: DC
  Filled 2023-04-04 – 2023-04-08 (×2): qty 90, 30d supply, fill #0
  Filled 2023-05-02: qty 90, 30d supply, fill #1
  Filled 2023-05-30: qty 90, 30d supply, fill #2

## 2023-04-04 NOTE — Telephone Encounter (Signed)
Next appt scheduled 4/10 with Dr Burnice Logan.

## 2023-04-08 ENCOUNTER — Other Ambulatory Visit: Payer: Self-pay

## 2023-04-08 ENCOUNTER — Other Ambulatory Visit (HOSPITAL_COMMUNITY): Payer: Self-pay

## 2023-04-08 ENCOUNTER — Ambulatory Visit (INDEPENDENT_AMBULATORY_CARE_PROVIDER_SITE_OTHER): Payer: Self-pay | Admitting: Internal Medicine

## 2023-04-08 ENCOUNTER — Encounter: Payer: Self-pay | Admitting: Internal Medicine

## 2023-04-08 VITALS — BP 126/66 | HR 80 | Temp 98.1°F | Ht 67.0 in | Wt 238.4 lb

## 2023-04-08 DIAGNOSIS — Z7984 Long term (current) use of oral hypoglycemic drugs: Secondary | ICD-10-CM

## 2023-04-08 DIAGNOSIS — E1142 Type 2 diabetes mellitus with diabetic polyneuropathy: Secondary | ICD-10-CM

## 2023-04-08 DIAGNOSIS — I10 Essential (primary) hypertension: Secondary | ICD-10-CM

## 2023-04-08 DIAGNOSIS — Z794 Long term (current) use of insulin: Secondary | ICD-10-CM

## 2023-04-08 LAB — GLUCOSE, CAPILLARY: Glucose-Capillary: 81 mg/dL (ref 70–99)

## 2023-04-08 NOTE — Progress Notes (Unsigned)
   CC: 4 wk recheck  HPI:Amy Norris is a 57 y.o. female who presents for evaluation of 1 month fu. Please see individual problem based A/P for details.  46 yof with hx of DM and HTN  Hypertension BP controlled with losartan.  Checked BMP this visit. Tolerating medication. No changes to regimen.  Type 2 diabetes mellitus with peripheral neuropathy (HCC) Patient was instructed to increase her 75/25 up from 45 units in AM to 50 units and continue with 45 units at night due to persistent hyperglycemia. She has not done this. She is concerned about hypoglycemia. AM blood sugars are in range, and have been in 300s by end of the day.  She does not eat regular meal during day. She has been eating Easter chocolate and Peeps throughout the day and more regular large dinner at night. Discussed better diet with her. She understands. She has been taking [place]. Was able to get Victoza.  Defers foot exam. Wants to schedule her own eye exam. She is self-pay so will defer urine  and lipid tests at this time. Her Dm appears to be otherwise historically well controlled and will have her follow up in several months for routine DM visit.    Depression, PHQ-9: Based on the patients  Flowsheet Row Office Visit from 04/08/2023 in [facility]  PHQ-9 Total Score 1      score we have .  Past Medical History:  Diagnosis Date   Anal fissure    Breast mass    Bronchitis    Carpal tunnel syndrome, bilateral    Cholelithiasis    Depression    Diabetes mellitus    Type II   Essential hypertension    GERD (gastroesophageal reflux disease)    Herpes simplex type 1 infection    vaginal   Hypokalemia    2/2 diarrhea   Menorrhagia    Obesity    Polycystic ovarian disease    Postural dizziness with presyncope 11/29/2013   Renal calculi    Renal calculus    Sleep apnea    no cpap   Sleep apnea, obstructive    Review of Systems:   See hpi  Physical Exam: Vitals:    04/08/23 1530  BP: 126/66  Pulse: 80  Temp: 98.1 F (36.7 C)  TempSrc: Oral  SpO2: 98%  Weight: 238 lb 6.4 oz (108.1 kg)  Height: 5\' 7"  (1.702 m)   General: nad HEENT: Conjunctiva nl , antiicteric sclerae, moist mucous membranes, no exudate or erythema Cardiovascular: Normal rate, regular rhythm.  No murmurs, rubs, or gallops Pulmonary : Equal breath sounds, No wheezes, rales, or rhonchi Abdominal: soft, nontender,  bowel sounds present Ext: No edema in lower extremities, no tenderness to palpation of lower extremities.   Assessment & Plan:   See Encounters Tab for problem based charting.  Patient discussed with Dr. Cleda Daub

## 2023-04-08 NOTE — Patient Instructions (Addendum)
Dear Mrs. [place],  Thank you for trusting [place] with your care. We discussed blood pressure and diabetes.  Blood pressure is doing well.   For the diabetes, please check your blood sugar every time it feels low. This helps [place] determine if we need to make changes. Please cut down the sugary foods and try to be more consistent with healthier options.  We will do a foot exam next time.  Please schedule the eye exam.   We would like to get a lipid panel done and check your urine at some point. I have ordered these tests to be done in the future. We can have you come in for a lab only visit to get these done as able.   Please return in 3 months for a follow up.

## 2023-04-09 ENCOUNTER — Encounter: Payer: No Typology Code available for payment source | Admitting: Internal Medicine

## 2023-04-09 LAB — BMP8+ANION GAP
Anion Gap: 15 mmol/L (ref 10.0–18.0)
BUN/Creatinine Ratio: 14 (ref 9–23)
BUN: 9 mg/dL (ref 6–24)
CO2: 26 mmol/L (ref 20–29)
Calcium: 9.6 mg/dL (ref 8.7–10.2)
Chloride: 105 mmol/L (ref 96–106)
Creatinine, Ser: 0.63 mg/dL (ref 0.57–1.00)
Glucose: 73 mg/dL (ref 70–99)
Potassium: 3.9 mmol/L (ref 3.5–5.2)
Sodium: 146 mmol/L — ABNORMAL HIGH (ref 134–144)
eGFR: 104 mL/min/{1.73_m2} (ref >59)

## 2023-04-10 ENCOUNTER — Encounter: Payer: Self-pay | Admitting: Internal Medicine

## 2023-04-10 NOTE — Assessment & Plan Note (Addendum)
Patient was instructed to increase her 75/25 up from 45 units in AM to 50 units and continue with 45 units at night due to persistent hyperglycemia. She has not done this. She is concerned about hypoglycemia. AM blood sugars are in range, and have been in 300s by end of the day.  She does not eat regular meal during day. She has been eating Easter chocolate and Peeps throughout the day and more regular large dinner at night. Discussed better diet with her. She understands. She has been taking [place]. Was able to get Victoza.  Defers foot exam. Wants to schedule her own eye exam. She is self-pay so will defer urine  and lipid tests at this time. Her Dm appears to be otherwise historically well controlled and will have her follow up in several months for routine DM visit.

## 2023-04-10 NOTE — Assessment & Plan Note (Signed)
BP controlled with losartan.  Checked BMP this visit. Tolerating medication. No changes to regimen.

## 2023-04-11 NOTE — Progress Notes (Signed)
Internal Medicine Clinic Attending  Case discussed with the resident at the time of the visit.  We reviewed the resident's history and exam and pertinent patient test results.  I agree with the assessment, diagnosis, and plan of care documented in the resident's note.  

## 2023-04-29 ENCOUNTER — Other Ambulatory Visit (HOSPITAL_COMMUNITY): Payer: Self-pay

## 2023-05-02 ENCOUNTER — Other Ambulatory Visit (HOSPITAL_COMMUNITY): Payer: Self-pay

## 2023-05-02 ENCOUNTER — Other Ambulatory Visit: Payer: Self-pay

## 2023-05-02 ENCOUNTER — Other Ambulatory Visit: Payer: Self-pay | Admitting: Internal Medicine

## 2023-05-02 DIAGNOSIS — E7800 Pure hypercholesterolemia, unspecified: Secondary | ICD-10-CM

## 2023-05-02 MED ORDER — ATORVASTATIN CALCIUM 40 MG PO TABS
40.0000 mg | ORAL_TABLET | Freq: Every day | ORAL | 2 refills | Status: DC
Start: 2023-05-02 — End: 2024-04-24
  Filled 2023-05-02: qty 30, 30d supply, fill #0
  Filled 2023-05-30: qty 30, 30d supply, fill #1
  Filled 2023-06-30 – 2023-08-29 (×2): qty 30, 30d supply, fill #2
  Filled 2023-10-22: qty 30, 30d supply, fill #3
  Filled 2023-11-24: qty 30, 30d supply, fill #4
  Filled 2023-12-24: qty 30, 30d supply, fill #5
  Filled 2024-01-28: qty 30, 30d supply, fill #6
  Filled 2024-02-27: qty 30, 30d supply, fill #7
  Filled 2024-03-29: qty 30, 30d supply, fill #8

## 2023-05-03 ENCOUNTER — Other Ambulatory Visit (HOSPITAL_COMMUNITY): Payer: Self-pay

## 2023-05-05 ENCOUNTER — Other Ambulatory Visit: Payer: Self-pay

## 2023-05-07 ENCOUNTER — Other Ambulatory Visit (HOSPITAL_COMMUNITY): Payer: Self-pay

## 2023-05-31 ENCOUNTER — Other Ambulatory Visit: Payer: Self-pay

## 2023-05-31 ENCOUNTER — Other Ambulatory Visit (HOSPITAL_COMMUNITY): Payer: Self-pay

## 2023-06-02 ENCOUNTER — Other Ambulatory Visit (HOSPITAL_COMMUNITY): Payer: Self-pay

## 2023-06-02 ENCOUNTER — Other Ambulatory Visit: Payer: Self-pay

## 2023-06-30 ENCOUNTER — Other Ambulatory Visit: Payer: Self-pay

## 2023-06-30 ENCOUNTER — Other Ambulatory Visit (HOSPITAL_COMMUNITY): Payer: Self-pay

## 2023-06-30 ENCOUNTER — Other Ambulatory Visit: Payer: Self-pay | Admitting: Internal Medicine

## 2023-06-30 DIAGNOSIS — E1142 Type 2 diabetes mellitus with diabetic polyneuropathy: Secondary | ICD-10-CM

## 2023-07-01 ENCOUNTER — Other Ambulatory Visit (HOSPITAL_COMMUNITY): Payer: Self-pay

## 2023-07-01 MED ORDER — CONTOUR NEXT TEST VI STRP
ORAL_STRIP | Freq: Four times a day (QID) | 12 refills | Status: DC
Start: 2023-07-01 — End: 2024-07-09
  Filled 2023-07-01 – 2023-09-02 (×3): qty 100, 25d supply, fill #0
  Filled 2023-10-22 – 2024-01-28 (×6): qty 100, 25d supply, fill #1

## 2023-07-01 MED ORDER — GABAPENTIN 300 MG PO CAPS
300.0000 mg | ORAL_CAPSULE | Freq: Three times a day (TID) | ORAL | 2 refills | Status: DC
Start: 2023-07-01 — End: 2023-11-24
  Filled 2023-07-01 – 2023-07-24 (×2): qty 90, 30d supply, fill #0
  Filled 2023-09-02: qty 90, 30d supply, fill #1
  Filled 2023-10-22: qty 90, 30d supply, fill #2

## 2023-07-02 ENCOUNTER — Other Ambulatory Visit (HOSPITAL_COMMUNITY): Payer: Self-pay

## 2023-07-10 ENCOUNTER — Other Ambulatory Visit: Payer: Self-pay

## 2023-07-14 ENCOUNTER — Other Ambulatory Visit (HOSPITAL_COMMUNITY): Payer: Self-pay

## 2023-07-14 ENCOUNTER — Ambulatory Visit (INDEPENDENT_AMBULATORY_CARE_PROVIDER_SITE_OTHER): Payer: Medicaid Other | Admitting: Internal Medicine

## 2023-07-14 VITALS — BP 129/79 | HR 77 | Temp 98.3°F | Ht 67.0 in | Wt 244.4 lb

## 2023-07-14 DIAGNOSIS — G5602 Carpal tunnel syndrome, left upper limb: Secondary | ICD-10-CM | POA: Diagnosis not present

## 2023-07-14 DIAGNOSIS — Z794 Long term (current) use of insulin: Secondary | ICD-10-CM

## 2023-07-14 DIAGNOSIS — E7800 Pure hypercholesterolemia, unspecified: Secondary | ICD-10-CM | POA: Diagnosis not present

## 2023-07-14 DIAGNOSIS — Z1239 Encounter for other screening for malignant neoplasm of breast: Secondary | ICD-10-CM

## 2023-07-14 DIAGNOSIS — E1142 Type 2 diabetes mellitus with diabetic polyneuropathy: Secondary | ICD-10-CM

## 2023-07-14 DIAGNOSIS — Z1231 Encounter for screening mammogram for malignant neoplasm of breast: Secondary | ICD-10-CM

## 2023-07-14 LAB — POCT GLYCOSYLATED HEMOGLOBIN (HGB A1C): Hemoglobin A1C: 7.2 % — AB (ref 4.0–5.6)

## 2023-07-14 LAB — GLUCOSE, CAPILLARY: Glucose-Capillary: 96 mg/dL (ref 70–99)

## 2023-07-14 MED ORDER — SEMAGLUTIDE(0.25 OR 0.5MG/DOS) 2 MG/3ML ~~LOC~~ SOPN
0.5000 mg | PEN_INJECTOR | SUBCUTANEOUS | 3 refills | Status: DC
Start: 2023-07-14 — End: 2023-08-25
  Filled 2023-07-14 – 2023-08-05 (×3): qty 3, 28d supply, fill #0

## 2023-07-14 NOTE — Assessment & Plan Note (Signed)
A1c improved from 7.8 to 7.2 4 months ago. She reports adherence with humalog 75/25 50 units in AM and 45 units PM, victoza 1.8 mg, and dapagliflozin 10 mg. She checks her blood sugars 2 times daily and hasn't had low blood sugars in several months. Her fasting AM sugar is between 120-170s. PM blood sugars are elevated in 200s.  P: Continue humalog 75/25 50 units AM and 45 units PM- this insulin likely selected due to prior insurance status. She recently was approved for medicaid.. Would consider going to long acting and meal time insulin at follow-up in 4 weeks.  Switch victoza 1.8 mg every day to ozempic 0.5 mg weekly, would like to see if she can tolerate higher dose to help with weight loss- follow-up in 4 weeks Referral to ophthalmology Urine microalbumin

## 2023-07-14 NOTE — Patient Instructions (Addendum)
Thank you, Ms.Timiyah P Hynson for allowing [place] to provide your care today.   Diabetes Your A1c was better at 7.2 from 7.8. Keep up the good work. I referred you to the eye doctor for yearly exam. I will call with results of urine test. Please continue insulin at same dose. Stop victoza and pick up ozempic. Ozempic is once week inkection.  Follow-up in 4 week, with your insurance I think we could consider adjusting your insulin to cover food better.  Carpal tunnel I included a packet about carpal tunnel. You can try wearing a brace at night to see if this helps, these are at most pharmacies.   I will call with cholesterol results.  I have ordered the following labs for you:   Lab Orders         Glucose, capillary         POC Hbg A1C      Referrals ordered today:   Referral Orders  No referral(s) requested today     I have ordered the following medication/changed the following medications:   Stop the following medications: Medications Discontinued During This Encounter  Medication Reason   scopolamine (TRANSDERM-SCOP) 1 MG/3DAYS      Start the following medications: No orders of the defined types were placed in this encounter.    Follow up:  4 weeks  We look forward to seeing you next time. Please call our clinic at [phone] if you have any questions or concerns. The best time to call is Monday-Friday from 9am-4pm, but there is someone available 24/7. If after hours or the weekend, call the main hospital number and ask for the Internal Medicine Resident On-Call. If you need medication refills, please notify your pharmacy one week in advance and they will send [place] a request.   Thank you for trusting me with your care. Wishing you the best!   Rudene Christians, DO [facility]

## 2023-07-14 NOTE — Assessment & Plan Note (Signed)
Numbness and tingling present to left 3rd, 4th, and 5 th fingers for the last several weeks. On exam grip strength remains symmetric bilaterally, no atrophy. Main activity she notes in phone use P: Trial with splint at night. Activity modification encouraged.

## 2023-07-14 NOTE — Progress Notes (Signed)
Subjective:  CC: diabetes  HPI:  [name] is a 57 y.o. female with a past medical history stated below and presents today for follow-up on diabetes. Please see problem based assessment and plan for additional details.  Past Medical History:  Diagnosis Date   Anal fissure    Breast mass    Bronchitis    Carpal tunnel syndrome, bilateral    Cholelithiasis    Depression    Diabetes mellitus    Type II   Essential hypertension    GERD (gastroesophageal reflux disease)    Herpes simplex type 1 infection    vaginal   Hypokalemia    2/2 diarrhea   Menorrhagia    Obesity    Polycystic ovarian disease    Postural dizziness with presyncope 11/29/2013   Renal calculi    Renal calculus    Sleep apnea    no cpap   Sleep apnea, obstructive     Current Outpatient Medications on File Prior to Visit  Medication Sig Dispense Refill   albuterol (VENTOLIN HFA) 108 (90 Base) MCG/ACT inhaler Inhale 2 puffs into the lungs every 6 (six) hours as needed for wheezing or shortness of breath. 6.7 g 2   atorvastatin (LIPITOR) 40 MG tablet Take 1 tablet (40 mg total) by mouth daily. 90 tablet 2   benzonatate (TESSALON) 100 MG capsule Take 1 capsule (100 mg total) by mouth 3 (three) times daily as needed for cough. 30 capsule 0   colestipol (COLESTID) 1 g tablet Take 2 tablets (2 g total) by mouth 2 (two) times daily. 120 tablet 2   dapagliflozin propanediol (FARXIGA) 10 MG TABS tablet Take 1 tablet (10 mg total) by mouth daily. 30 tablet 11   diclofenac Sodium (VOLTAREN) 1 % GEL Apply 4 g topically 4 (four) times daily. 100 g 1   FLUoxetine (PROZAC) 40 MG capsule Take 1 capsule (40 mg total) by mouth daily. 90 capsule 3   gabapentin (NEURONTIN) 300 MG capsule Take 1 capsule (300 mg total) by mouth 3 (three) times daily. 90 capsule 2   glucose blood (CONTOUR NEXT TEST) test strip Use to test blood glucose up to 4 times per day. 200 strip 12   Insulin Lispro Prot & Lispro (HUMALOG MIX  75/25 KWIKPEN) (75-25) 100 UNIT/ML Kwikpen Inject 50 Units into the skin daily with breakfast AND 45 Units daily with dinner. 30 mL 2   Insulin Pen Needle 32G X 6 MM MISC Please use to inject insulin five times daily. 500 each 11   lisinopril (ZESTRIL) 10 MG tablet Take 1 tablet (10 mg total) by mouth daily. 30 tablet 11   Microlet Lancets MISC Use to check blood glucose up to 4 times per day. 200 each 12   No current facility-administered medications on file prior to visit.    Family History  Problem Relation Age of Onset   Hypertension Mother    Breast cancer Mother    Diabetes Father    Breast cancer Maternal Aunt    Colon cancer Neg Hx    Esophageal cancer Neg Hx    Stomach cancer Neg Hx    Rectal cancer Neg Hx     Social History   Socioeconomic History   Marital status: Married    Spouse name: Not on file   Number of children: Not on file   Years of education: Not on file   Highest education level: Not on file  Occupational History   Occupation:  Deli    Employer: [employer]  Tobacco Use   Smoking status: Former    Current packs/day: 0.00    Types: Cigarettes    Quit date: 12/30/1978    Years since quitting: 44.5   Smokeless tobacco: Never  Vaping Use   Vaping status: Never Used  Substance and Sexual Activity   Alcohol use: Not Currently    Comment: occ.   Drug use: No   Sexual activity: Yes    Birth control/protection: Post-menopausal  Other Topics Concern   Not on file  Social History Narrative    Smoked many years ago drinks 1-2 beers on Friday and Saturdays no illegal drug use. Married and lives with her husband and sister-in-law son daughter and grandson.   Financial assistance approved for 100% discount at [facility] and has [facility] card per Rudell Cobb as of August 06, 2010 6:27 PM   Social Determinants of Health   Financial Resource Strain: Not on file  Food Insecurity: No Food Insecurity (04/11/2022)   Hunger Vital Sign    Worried About Running Out of Food  in the Last Year: Never true    Ran Out of Food in the Last Year: Never true  Transportation Needs: No Transportation Needs (04/11/2022)   PRAPARE - Administrator, Civil Service (Medical): No    Lack of Transportation (Non-Medical): No  Physical Activity: Not on file  Stress: Not on file  Social Connections: Not on file  Intimate Partner Violence: Not on file    Review of Systems: ROS negative except for what is noted on the assessment and plan.  Objective:   Vitals:   07/14/23 1412  BP: 129/79  Pulse: 77  Temp: 98.3 F (36.8 C)  TempSrc: Oral  SpO2: 98%  Weight: 244 lb 6.4 oz (110.9 kg)  Height: 5\' 7"  (1.702 m)    Physical Exam: Constitutional: well-appearing  Cardiovascular: regular rate and rhythm, no m/r/g Pulmonary/Chest: normal work of breathing on room air, lungs clear to auscultation bilaterally Abdominal: soft, non-tender, non-distended MSK: grip strength 5/5 bilaterally, no atrophy of muscle of left hand Skin: warm and dry  Assessment & Plan:  Type 2 diabetes mellitus with peripheral neuropathy (HCC) A1c improved from 7.8 to 7.2 4 months ago. She reports adherence with humalog 75/25 50 units in AM and 45 units PM, victoza 1.8 mg, and dapagliflozin 10 mg. She checks her blood sugars 2 times daily and hasn't had low blood sugars in several months. Her fasting AM sugar is between 120-170s. PM blood sugars are elevated in 200s.  P: Continue humalog 75/25 50 units AM and 45 units PM- this insulin likely selected due to prior insurance status. She recently was approved for medicaid.. Would consider going to long acting and meal time insulin at follow-up in 4 weeks.  Switch victoza 1.8 mg every day to ozempic 0.5 mg weekly, would like to see if she can tolerate higher dose to help with weight loss- follow-up in 4 weeks Referral to ophthalmology Urine microalbumin  Hypercholesteremia Lab Results  Component Value Date   CHOL 242 (H) 08/08/2021   HDL 70  08/08/2021   LDLCALC 148 (H) 08/08/2021   TRIG 136 08/08/2021   CHOLHDL 3.5 08/08/2021   Will recheck lipid panel. She reports missing atorvastatin 40 mg about 1-2 times weekly. No side effects from medication. Goal is for primary prevention.  Carpal tunnel syndrome Numbness and tingling present to left 3rd, 4th, and 5 th fingers for the last  several weeks. On exam grip strength remains symmetric bilaterally, no atrophy. Main activity she notes in phone use P: Trial with splint at night. Activity modification encouraged.  Patient discussed with Dr. Josetta Huddle Apolo Cutshaw, D.O. [facility]  PGY-3 Pager: [phone]  Phone: [phone] Date 07/14/2023  Time 5:47 PM

## 2023-07-14 NOTE — Assessment & Plan Note (Signed)
Lab Results  Component Value Date   CHOL 242 (H) 08/08/2021   HDL 70 08/08/2021   LDLCALC 148 (H) 08/08/2021   TRIG 136 08/08/2021   CHOLHDL 3.5 08/08/2021   Will recheck lipid panel. She reports missing atorvastatin 40 mg about 1-2 times weekly. No side effects from medication. Goal is for primary prevention.

## 2023-07-15 LAB — LIPID PANEL
Chol/HDL Ratio: 2.2 ratio (ref 0.0–4.4)
Cholesterol, Total: 151 mg/dL (ref 100–199)
HDL: 68 mg/dL (ref >39)
LDL Chol Calc (NIH): 70 mg/dL (ref 0–99)
Triglycerides: 67 mg/dL (ref 0–149)
VLDL Cholesterol Cal: 13 mg/dL (ref 5–40)

## 2023-07-15 LAB — MICROALBUMIN / CREATININE URINE RATIO
Creatinine, Urine: 68.2 mg/dL
Microalb/Creat Ratio: 46 mg/g creat — ABNORMAL HIGH (ref 0–29)
Microalbumin, Urine: 31.7 ug/mL

## 2023-07-15 NOTE — Progress Notes (Signed)
Internal Medicine Clinic Attending  Case discussed with the resident physician at the time of the visit.  We reviewed the patient's history, exam, and pertinent patient test results.  I agree with the assessment, diagnosis, and plan of care documented in the resident's note.

## 2023-07-24 ENCOUNTER — Other Ambulatory Visit (HOSPITAL_COMMUNITY): Payer: Self-pay

## 2023-07-24 ENCOUNTER — Other Ambulatory Visit: Payer: Self-pay | Admitting: Internal Medicine

## 2023-07-24 ENCOUNTER — Ambulatory Visit
Admission: RE | Admit: 2023-07-24 | Discharge: 2023-07-24 | Disposition: A | Discharge status: Home / Self Care | Payer: Medicaid Other | Source: Ambulatory Visit | Attending: Internal Medicine | Admitting: Internal Medicine

## 2023-07-24 DIAGNOSIS — E1142 Type 2 diabetes mellitus with diabetic polyneuropathy: Secondary | ICD-10-CM

## 2023-07-24 DIAGNOSIS — I10 Essential (primary) hypertension: Secondary | ICD-10-CM

## 2023-07-24 DIAGNOSIS — Z1231 Encounter for screening mammogram for malignant neoplasm of breast: Secondary | ICD-10-CM

## 2023-07-25 ENCOUNTER — Other Ambulatory Visit (HOSPITAL_COMMUNITY): Payer: Self-pay

## 2023-07-25 ENCOUNTER — Other Ambulatory Visit: Payer: Self-pay

## 2023-07-25 MED ORDER — INSULIN LISPRO PROT & LISPRO (75-25 MIX) 100 UNIT/ML KWIKPEN
PEN_INJECTOR | SUBCUTANEOUS | 2 refills | Status: DC
Start: 2023-07-25 — End: 2023-08-25
  Filled 2023-07-25: qty 30, 32d supply, fill #0

## 2023-07-25 MED ORDER — LISINOPRIL 10 MG PO TABS
10.0000 mg | ORAL_TABLET | Freq: Every day | ORAL | 3 refills | Status: DC
Start: 2023-07-25 — End: 2024-06-26
  Filled 2023-07-25: qty 90, 90d supply, fill #0
  Filled 2023-08-29 – 2023-10-27 (×4): qty 90, 90d supply, fill #1
  Filled 2023-12-03 – 2024-01-05 (×3): qty 90, 90d supply, fill #2
  Filled 2024-01-28 – 2024-04-24 (×6): qty 90, 90d supply, fill #3

## 2023-08-01 ENCOUNTER — Other Ambulatory Visit (HOSPITAL_COMMUNITY): Payer: Self-pay

## 2023-08-01 ENCOUNTER — Other Ambulatory Visit: Payer: Self-pay

## 2023-08-04 ENCOUNTER — Telehealth: Payer: Self-pay

## 2023-08-04 NOTE — Telephone Encounter (Signed)
Decision:Approved Amy Norris (Key: [id number]) Ozempic (0.25 or 0.5 MG/DOSE) 2MG /3ML pen-injectors Form [employer] Electronic Prior Authorization Form (2017 NCPDP) Created Message from Plan Request Reference Number: [id number]. OZEMPIC INJ 2MG /3ML is approved through 08/03/2024. For further questions, call [employer] at [phone].Marland Kitchen Authorization Expiration Date: August 03, 2024.

## 2023-08-04 NOTE — Telephone Encounter (Signed)
Prior Authorization for patient (Ozempic) came through on cover my meds was submitted with last office notes and labs awaiting approval or denial.  ONG:[id number]

## 2023-08-05 ENCOUNTER — Other Ambulatory Visit (HOSPITAL_COMMUNITY): Payer: Self-pay

## 2023-08-25 ENCOUNTER — Encounter: Payer: Self-pay | Admitting: Student

## 2023-08-25 ENCOUNTER — Other Ambulatory Visit (HOSPITAL_COMMUNITY): Payer: Self-pay

## 2023-08-25 ENCOUNTER — Ambulatory Visit (INDEPENDENT_AMBULATORY_CARE_PROVIDER_SITE_OTHER): Payer: Medicaid Other | Admitting: Student

## 2023-08-25 VITALS — BP 134/71 | HR 72 | Temp 98.0°F | Ht 67.0 in | Wt 246.6 lb

## 2023-08-25 DIAGNOSIS — Z7985 Long-term (current) use of injectable non-insulin antidiabetic drugs: Secondary | ICD-10-CM

## 2023-08-25 DIAGNOSIS — E1142 Type 2 diabetes mellitus with diabetic polyneuropathy: Secondary | ICD-10-CM | POA: Diagnosis present

## 2023-08-25 DIAGNOSIS — M6530 Trigger finger, unspecified finger: Secondary | ICD-10-CM

## 2023-08-25 DIAGNOSIS — K529 Noninfective gastroenteritis and colitis, unspecified: Secondary | ICD-10-CM | POA: Diagnosis not present

## 2023-08-25 DIAGNOSIS — R197 Diarrhea, unspecified: Secondary | ICD-10-CM

## 2023-08-25 DIAGNOSIS — Z794 Long term (current) use of insulin: Secondary | ICD-10-CM | POA: Diagnosis not present

## 2023-08-25 MED ORDER — INSULIN GLARGINE SOLOSTAR 100 UNIT/ML ~~LOC~~ SOPN
28.0000 [IU] | PEN_INJECTOR | Freq: Two times a day (BID) | SUBCUTANEOUS | 0 refills | Status: AC
Start: 2023-08-25 — End: 2023-09-29
  Filled 2023-08-25: qty 15, 27d supply, fill #0

## 2023-08-25 MED ORDER — SEMAGLUTIDE(0.25 OR 0.5MG/DOS) 2 MG/3ML ~~LOC~~ SOPN
1.0000 mg | PEN_INJECTOR | SUBCUTANEOUS | 3 refills | Status: DC
Start: 2023-08-25 — End: 2023-08-29
  Filled 2023-08-25: qty 3, fill #0
  Filled 2023-08-29: qty 3, 28d supply, fill #0

## 2023-08-25 NOTE — Patient Instructions (Addendum)
Thank you so much for coming to the clinic today!   For your diabetes, we discontinued your Humalog and added glargine.  Please take 28 units 2 times per day.  We also increased your Ozempic dosage to 1 mg/week.    For your diarrhea, we will be completing different tests to further work this up.  We have also sent in a referral to the GI doctors.  Please follow-up if you do not hear back from them.  For your trigger finger, we have also sent in a referral for the hand surgeons.  Please follow-up if you do not hear back from them.  If you have any questions please feel free to the call the clinic at anytime at [phone]. It was a pleasure seeing you!  Best, Dr. Rayvon Char

## 2023-08-25 NOTE — Assessment & Plan Note (Signed)
A: Recently switched from victoza to Ozempic.  Patient denies any signs or symptoms associated with the medication has been tolerating it well.  She has been taking her a.m. and p.m. blood sugars.  Her a.m. values are in the 200s but sometimes slightly lower.  At night, there is more variability but typically the value is around 200.  1 time this month value was 400. P: -Increase Ozempic to 1 mg/week -Discontinue HUMALOG MIX 75/25 KWIKPEN -Begin glargine 28 units twice daily

## 2023-08-25 NOTE — Progress Notes (Signed)
Established Patient Office Visit  Subjective   Patient ID: Amy Norris, female    DOB: 25-Aug-1966  Age: 57 y.o. MRN: [id number]  Chief Complaint  Patient presents with   Follow-up    ROUTINE OFFICE VISIT - FOLLOW UP ON MEDICATION CHANGES / ? MEDICATION REFILL    HPI  [name] is a 57 YO F with a past medical history as stated below who presents today for follow-up on diabetes.  Please see problem-based assessment and plan for additional details.  Review of Systems  Constitutional: Negative.   HENT: Negative.    Eyes: Negative.   Respiratory: Negative.    Cardiovascular: Negative.   Gastrointestinal: Negative.   Genitourinary: Negative.   Musculoskeletal: Negative.   Skin: Negative.    Past Medical History:  Diagnosis Date   Anal fissure    Breast mass    Bronchitis    Carpal tunnel syndrome, bilateral    Cholelithiasis    Depression    Diabetes mellitus    Type II   Essential hypertension    GERD (gastroesophageal reflux disease)    Herpes simplex type 1 infection    vaginal   Hypokalemia    2/2 diarrhea   Menorrhagia    Obesity    Polycystic ovarian disease    Postural dizziness with presyncope 11/29/2013   Renal calculi    Renal calculus    Sleep apnea    no cpap   Sleep apnea, obstructive       Objective:     BP 134/71 (BP Location: Right Arm, Patient Position: Sitting, Cuff Size: Normal)   Pulse 72   Temp 98 F (36.7 C) (Oral)   Ht 5\' 7"  (1.702 m)   Wt 246 lb 9.6 oz (111.9 kg)   LMP 10/02/2020 (Approximate)   SpO2 98%   BMI 38.62 kg/m    Physical Exam Constitutional:      Appearance: Normal appearance.  HENT:     Head: Normocephalic and atraumatic.  Cardiovascular:     Rate and Rhythm: Normal rate and regular rhythm.  Pulmonary:     Effort: Pulmonary effort is normal.     Breath sounds: Normal breath sounds.  Abdominal:     General: Abdomen is flat. Bowel sounds are normal.  Neurological:     Mental Status: She is  alert.    The 10-year ASCVD risk score (Arnett DK, et al., 2019) is: 4.4%    Assessment & Plan:   Problem List Items Addressed This Visit       Digestive   Chronic diarrhea    A:  Chronic issue that has been going on for several years. Last worked up in 2021 by Dr. Barbaraann Faster. Long history w/ previous normal CT abdomen/pelvis. Previously treated with Colestipol 2 g BID but patient denies taking the medication.  Patient denies any blood in stool.  Currently taking Imodium once daily for symptom control with improvement. P:  -Follow-up CRP, ESR, fecal calprotectin, CBC with differential, fecal ostomies, and TSH -Follow-up with GI referral -Continue Imodium        Endocrine   Type 2 diabetes mellitus with peripheral neuropathy (HCC) - Primary (Chronic)    A: Recently switched from victoza to Ozempic.  Patient denies any signs or symptoms associated with the medication has been tolerating it well.  She has been taking her a.m. and p.m. blood sugars.  Her a.m. values are in the 200s but sometimes slightly lower.  At night, there is more  variability but typically the value is around 200.  1 time this month value was 400. P: -Increase Ozempic to 1 mg/week -Discontinue HUMALOG MIX 75/25 KWIKPEN -Begin glargine 28 units twice daily      Relevant Medications   Semaglutide,0.25 or 0.5MG /DOS, 2 MG/3ML SOPN   Insulin Glargine Solostar (LANTUS) 100 UNIT/ML Solostar Pen     Musculoskeletal and Integument   Trigger finger (Chronic)    A: Splint initially helped but patient is no longer wearing this.  Since she has stopped working, there has been improvement but the hand still gets worse when using it.  Recommended that she follows up with hand surgeon now that she is covered under insurance. P:  -Follow-up with hand surgery      Relevant Orders   Ambulatory referral to Hand Surgery   Other Visit Diagnoses     Diarrhea, unspecified type       Relevant Orders   C-reactive protein   Sed  Rate (ESR)   Calprotectin, Fecal   Pancreatic Elastase, Fecal   CBC with Diff   TSH   Ambulatory referral to Gastroenterology       Return in about 2 weeks (around 09/08/2023) for Diabetes medication adjustment .    Morrie Sheldon, MD

## 2023-08-25 NOTE — Assessment & Plan Note (Signed)
A: Splint initially helped but patient is no longer wearing this.  Since she has stopped working, there has been improvement but the hand still gets worse when using it.  Recommended that she follows up with hand surgeon now that she is covered under insurance. P:  -Follow-up with hand surgery

## 2023-08-25 NOTE — Assessment & Plan Note (Signed)
A:  Chronic issue that has been going on for several years. Last worked up in 2021 by Dr. Barbaraann Faster. Long history w/ previous normal CT abdomen/pelvis. Previously treated with Colestipol 2 g BID but patient denies taking the medication.  Patient denies any blood in stool.  Currently taking Imodium once daily for symptom control with improvement. P:  -Follow-up CRP, ESR, fecal calprotectin, CBC with differential, fecal ostomies, and TSH -Follow-up with GI referral -Continue Imodium

## 2023-08-26 LAB — CBC WITH DIFFERENTIAL/PLATELET
Basophils Absolute: 0 10*3/uL (ref 0.0–0.2)
Basos: 0 %
EOS (ABSOLUTE): 0.1 10*3/uL (ref 0.0–0.4)
Eos: 1 %
Hematocrit: 48.4 % — ABNORMAL HIGH (ref 34.0–46.6)
Hemoglobin: 15.7 g/dL (ref 11.1–15.9)
Immature Grans (Abs): 0 10*3/uL (ref 0.0–0.1)
Immature Granulocytes: 0 %
Lymphocytes Absolute: 2.1 10*3/uL (ref 0.7–3.1)
Lymphs: 30 %
MCH: 29.2 pg (ref 26.6–33.0)
MCHC: 32.4 g/dL (ref 31.5–35.7)
MCV: 90 fL (ref 79–97)
Monocytes Absolute: 0.5 10*3/uL (ref 0.1–0.9)
Monocytes: 7 %
Neutrophils Absolute: 4.3 10*3/uL (ref 1.4–7.0)
Neutrophils: 62 %
Platelets: 317 10*3/uL (ref 150–450)
RBC: 5.37 x10E6/uL — ABNORMAL HIGH (ref 3.77–5.28)
RDW: 12.8 % (ref 11.7–15.4)
WBC: 7 10*3/uL (ref 3.4–10.8)

## 2023-08-26 LAB — C-REACTIVE PROTEIN: CRP: <1 mg/L (ref 0–10)

## 2023-08-26 LAB — TSH: TSH: 1.09 u[IU]/mL (ref 0.450–4.500)

## 2023-08-26 LAB — SEDIMENTATION RATE: Sed Rate: 25 mm/h (ref 0–40)

## 2023-08-26 NOTE — Addendum Note (Signed)
Addended by: Dickie La on: 08/26/2023 03:55 PM   Modules accepted: Level of Service

## 2023-08-26 NOTE — Progress Notes (Deleted)
Internal Medicine Clinic Attending  I saw and evaluated the patient.  I personally confirmed the key portions of the history and exam documented by Dr. Rayvon Char and I reviewed pertinent patient test results.  The assessment, diagnosis, and plan were formulated together and I agree with the documentation in the resident's note. Transition from 75/25 to long-acting glargine insulin now with insurance coverage. 80% of the "long-acting" portion of 75/25 is ~28U bid. Close f/u in 2 weeks with glucometer review.

## 2023-08-28 ENCOUNTER — Other Ambulatory Visit: Payer: Medicaid Other

## 2023-08-28 DIAGNOSIS — R197 Diarrhea, unspecified: Secondary | ICD-10-CM

## 2023-08-29 ENCOUNTER — Other Ambulatory Visit: Payer: Self-pay | Admitting: Student

## 2023-08-29 ENCOUNTER — Telehealth: Payer: Self-pay

## 2023-08-29 ENCOUNTER — Encounter: Payer: Self-pay | Admitting: Internal Medicine

## 2023-08-29 ENCOUNTER — Other Ambulatory Visit: Payer: Self-pay

## 2023-08-29 ENCOUNTER — Other Ambulatory Visit (HOSPITAL_COMMUNITY): Payer: Self-pay

## 2023-08-29 DIAGNOSIS — E1142 Type 2 diabetes mellitus with diabetic polyneuropathy: Secondary | ICD-10-CM

## 2023-08-29 MED ORDER — SEMAGLUTIDE (1 MG/DOSE) 4 MG/3ML ~~LOC~~ SOPN
1.0000 mg | PEN_INJECTOR | SUBCUTANEOUS | 3 refills | Status: AC
Start: 2023-08-29 — End: 2023-09-30
  Filled 2023-08-29: qty 3, 28d supply, fill #0
  Filled 2023-09-28: qty 3, 28d supply, fill #1

## 2023-08-29 NOTE — Telephone Encounter (Signed)
Prior Authorization for patient Amy Norris) came through on cover my meds was submitted with last office notes and labs awaiting approval or denial.  MWU:[id number]

## 2023-08-30 LAB — PANCREATIC ELASTASE, FECAL: Pancreatic Elastase, Fecal: 62 ug Elast./g — ABNORMAL LOW (ref >200)

## 2023-08-30 LAB — CALPROTECTIN, FECAL: Calprotectin, Fecal: 17 ug/g (ref 0–120)

## 2023-09-01 ENCOUNTER — Other Ambulatory Visit: Payer: Self-pay | Admitting: Internal Medicine

## 2023-09-01 DIAGNOSIS — E1142 Type 2 diabetes mellitus with diabetic polyneuropathy: Secondary | ICD-10-CM

## 2023-09-01 MED ORDER — ACCU-CHEK GUIDE W/DEVICE KIT
PACK | Freq: Every day | 0 refills | Status: DC
  Filled 2023-09-01: qty 1, 1d supply, fill #0
  Filled 2023-09-02: qty 1, 30d supply, fill #0

## 2023-09-02 ENCOUNTER — Other Ambulatory Visit: Payer: Self-pay | Admitting: Student

## 2023-09-02 ENCOUNTER — Other Ambulatory Visit (HOSPITAL_COMMUNITY): Payer: Self-pay

## 2023-09-02 ENCOUNTER — Other Ambulatory Visit: Payer: Self-pay

## 2023-09-02 DIAGNOSIS — E1142 Type 2 diabetes mellitus with diabetic polyneuropathy: Secondary | ICD-10-CM

## 2023-09-02 MED ORDER — ACCU-CHEK SOFTCLIX LANCETS MISC
Freq: Four times a day (QID) | 12 refills | Status: AC
Start: 2023-09-02 — End: ?
  Filled 2023-09-02: qty 100, 25d supply, fill #0
  Filled 2023-10-22 – 2023-12-03 (×5): qty 100, 25d supply, fill #1
  Filled 2024-01-28 – 2024-03-02 (×2): qty 100, 25d supply, fill #2

## 2023-09-02 NOTE — Telephone Encounter (Signed)
Decision:Approved [employer] (Key: [facility]) PA Case ID #: [id number] Need Help? Call us at [phone] Outcome Approved on August 30 by [facility] 2017 NCPDP Request Reference Number: [id number]. FARXIGA TAB 10MG  is approved through 08/28/2024. For further questions, call [employer] at [phone]. Authorization Expiration Date: 08/28/2024 Drug Farxiga 10MG  tablets ePA cloud logo Form [employer] Electronic Prior Authorization Form [id number] NCPDP)

## 2023-09-02 NOTE — Progress Notes (Signed)
Reached out to patient via phone call and discussed lab results. Will f/u at next appointment.

## 2023-09-09 ENCOUNTER — Telehealth: Payer: Self-pay

## 2023-09-09 ENCOUNTER — Ambulatory Visit: Payer: Medicaid Other | Admitting: Student

## 2023-09-09 ENCOUNTER — Other Ambulatory Visit: Payer: Self-pay

## 2023-09-09 ENCOUNTER — Other Ambulatory Visit (HOSPITAL_COMMUNITY): Payer: Self-pay

## 2023-09-09 ENCOUNTER — Encounter: Payer: Self-pay | Admitting: Student

## 2023-09-09 VITALS — HR 96 | Temp 98.2°F | Ht 66.0 in | Wt 246.9 lb

## 2023-09-09 DIAGNOSIS — Z23 Encounter for immunization: Secondary | ICD-10-CM

## 2023-09-09 DIAGNOSIS — E1142 Type 2 diabetes mellitus with diabetic polyneuropathy: Secondary | ICD-10-CM | POA: Diagnosis not present

## 2023-09-09 DIAGNOSIS — K529 Noninfective gastroenteritis and colitis, unspecified: Secondary | ICD-10-CM

## 2023-09-09 DIAGNOSIS — Z7985 Long-term (current) use of injectable non-insulin antidiabetic drugs: Secondary | ICD-10-CM

## 2023-09-09 MED ORDER — DEXCOM G7 RECEIVER DEVI
0 refills | Status: DC
Start: 2023-09-09 — End: 2024-01-03
  Filled 2023-09-09: qty 1, 34d supply, fill #0
  Filled 2023-09-09: qty 1, 30d supply, fill #0

## 2023-09-09 MED ORDER — DEXCOM G7 SENSOR MISC
3 refills | Status: DC
  Filled 2023-09-09: qty 3, 30d supply, fill #0
  Filled 2023-09-28 – 2023-10-03 (×2): qty 3, 30d supply, fill #1
  Filled 2023-10-17 – 2023-10-31 (×2): qty 3, 30d supply, fill #2
  Filled 2023-12-01: qty 3, 30d supply, fill #3
  Filled 2023-12-25: qty 3, 30d supply, fill #4
  Filled 2024-01-28: qty 3, 30d supply, fill #5
  Filled 2024-02-27: qty 3, 30d supply, fill #6
  Filled 2024-03-29: qty 3, 30d supply, fill #7
  Filled 2024-04-24: qty 3, 30d supply, fill #8
  Filled 2024-05-26: qty 3, 30d supply, fill #9
  Filled 2024-06-25: qty 3, 30d supply, fill #10
  Filled 2024-07-23: qty 3, 30d supply, fill #11

## 2023-09-09 NOTE — Assessment & Plan Note (Signed)
Patient was last seen 2 weeks ago.  At this time, her Ozempic was increased to 1 mg/week.  Today, she has no concerns or associated signs or symptoms.  At her last visit, her Humalog was discontinued and she was started on glargine 20 units twice daily.  She has been able to take this every day without complications.  She denies any signs or symptoms consistent with lows or highs since last being in the clinic.  She does note 1-2 asymptomatic hypoglycemic episodes in which her sugars in the 200s.  She has been taking her glucose in the morning before bed.  It has consistently been in the 100s to 140s.  She has tried Dexcom in the past but noted that it fell off her arm.  We will trial this again pending insurance coverage. - Continue glargine 20 units twice daily - Begin CGM - Continue Ozempic 1 mg/week--follow-up in 2 weeks to possibly increase dosage

## 2023-09-09 NOTE — Patient Instructions (Signed)
Thank you so much for coming to the clinic today!   Today, we discussed your diarrhea and diabetes. For your diarrhea, we will reach out to our referral coordinator to help get you an appointment. For your diabetes, I have put in an order for the continuous glucose monitoring.   We will see you again in ~2 weeks.   If you have any questions please feel free to the call the clinic at anytime at [phone]. It was a pleasure seeing you!  Best, Dr. Rayvon Char

## 2023-09-09 NOTE — Progress Notes (Signed)
CC: Follow-up  HPI: Amy Norris is a 57 y.o. female living with a history stated below and presents today for follow-up. Please see problem based assessment and plan for additional details.  Past Medical History:  Diagnosis Date   Anal fissure    Breast mass    Bronchitis    Carpal tunnel syndrome, bilateral    Cholelithiasis    Depression    Diabetes mellitus    Type II   Essential hypertension    GERD (gastroesophageal reflux disease)    Herpes simplex type 1 infection    vaginal   Hypokalemia    2/2 diarrhea   Menorrhagia    Obesity    Polycystic ovarian disease    Postural dizziness with presyncope 11/29/2013   Renal calculi    Renal calculus    Sleep apnea    no cpap   Sleep apnea, obstructive     Current Outpatient Medications on File Prior to Visit  Medication Sig Dispense Refill   Accu-Chek Softclix Lancets lancets Test 4 (four) times daily. 100 each 12   albuterol (VENTOLIN HFA) 108 (90 Base) MCG/ACT inhaler Inhale 2 puffs into the lungs every 6 (six) hours as needed for wheezing or shortness of breath. 6.7 g 2   atorvastatin (LIPITOR) 40 MG tablet Take 1 tablet (40 mg total) by mouth daily. 90 tablet 2   benzonatate (TESSALON) 100 MG capsule Take 1 capsule (100 mg total) by mouth 3 (three) times daily as needed for cough. 30 capsule 0   Blood Glucose Monitoring Suppl (ACCU-CHEK GUIDE) w/Device KIT Test blood sugar daily before breakfast. 1 kit 0   colestipol (COLESTID) 1 g tablet Take 2 tablets (2 g total) by mouth 2 (two) times daily. 120 tablet 2   dapagliflozin propanediol (FARXIGA) 10 MG TABS tablet Take 1 tablet (10 mg total) by mouth daily. 30 tablet 11   diclofenac Sodium (VOLTAREN) 1 % GEL Apply 4 g topically 4 (four) times daily. 100 g 1   FLUoxetine (PROZAC) 40 MG capsule Take 1 capsule (40 mg total) by mouth daily. 90 capsule 3   gabapentin (NEURONTIN) 300 MG capsule Take 1 capsule (300 mg total) by mouth 3 (three) times daily. 90 capsule 2    glucose blood (CONTOUR NEXT TEST) test strip Use to test blood glucose up to 4 times per day. 200 strip 12   Insulin Glargine Solostar (LANTUS) 100 UNIT/ML Solostar Pen Inject 28 Units into the skin 2 (two) times daily. 15 mL 0   Insulin Pen Needle 32G X 6 MM MISC Please use to inject insulin five times daily. 500 each 11   lisinopril (ZESTRIL) 10 MG tablet Take 1 tablet (10 mg total) by mouth daily. 90 tablet 3   Microlet Lancets MISC Use to check blood glucose up to 4 times per day. 200 each 12   Semaglutide, 1 MG/DOSE, 4 MG/3ML SOPN Inject 1 mg into the skin once a week. 3 mL 3   No current facility-administered medications on file prior to visit.    Family History  Problem Relation Age of Onset   Hypertension Mother    Breast cancer Mother    Diabetes Father    Breast cancer Maternal Aunt    Colon cancer Neg Hx    Esophageal cancer Neg Hx    Stomach cancer Neg Hx    Rectal cancer Neg Hx     Social History   Socioeconomic History   Marital status: Married  Spouse name: Not on file   Number of children: Not on file   Years of education: Not on file   Highest education level: Not on file  Occupational History   Occupation: Deli    Employer: [employer]  Tobacco Use   Smoking status: Former    Current packs/day: 0.00    Types: Cigarettes    Quit date: 12/30/1978    Years since quitting: 44.7   Smokeless tobacco: Never  Vaping Use   Vaping status: Never Used  Substance and Sexual Activity   Alcohol use: Not Currently    Comment: occ.   Drug use: No   Sexual activity: Yes    Birth control/protection: Post-menopausal  Other Topics Concern   Not on file  Social History Narrative    Smoked many years ago drinks 1-2 beers on Friday and Saturdays no illegal drug use. Married and lives with her husband and sister-in-law son daughter and grandson.   Financial assistance approved for 100% discount at [facility] and has [facility] card per Rudell Cobb as of August 06, 2010 6:27 PM    Social Determinants of Health   Financial Resource Strain: Not on file  Food Insecurity: No Food Insecurity (04/11/2022)   Hunger Vital Sign    Worried About Running Out of Food in the Last Year: Never true    Ran Out of Food in the Last Year: Never true  Transportation Needs: No Transportation Needs (04/11/2022)   PRAPARE - Administrator, Civil Service (Medical): No    Lack of Transportation (Non-Medical): No  Physical Activity: Not on file  Stress: Not on file  Social Connections: Not on file  Intimate Partner Violence: Not on file    Review of Systems: ROS negative except for what is noted on the assessment and plan.  Vitals:   09/09/23 1032  Pulse: 96  Temp: 98.2 F (36.8 C)  TempSrc: Oral  SpO2: 100%  Weight: 246 lb 14.4 oz (112 kg)  Height: 5\' 6"  (1.676 m)    Physical Exam: Constitutional: well-appearing in no acute distress HENT: normocephalic atraumatic, mucous membranes moist Eyes: conjunctiva non-erythematous Neck: supple Cardiovascular: regular rate and rhythm, no m/r/g Pulmonary/Chest: normal work of breathing on room air, lungs clear to auscultation bilaterally Abdominal: soft, non-tender, non-distended MSK: normal bulk and tone Neurological: alert & oriented x 3, 5/5 strength in bilateral upper and lower extremities, normal gait Skin: warm and dry Assessment & Plan:   Chronic diarrhea Last seen 2 weeks ago. CBC w/ diff, TSH, fecal calprotectin, ESR, and CRP were negative. Pancreatic elastase was decreased at 62. 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 Diarrhea hasn't improved. Haven't heard from GI docs. No steatorrhea. Imodium BID hasn't been helping. Will contact referral coordinator to help expedite process.  -Follow-up with GI  Type 2 diabetes mellitus with peripheral neuropathy (HCC) Patient was last seen 2 weeks ago.  At this time, her Ozempic was increased to 1 mg/week.  Today, she  has no concerns or associated signs or symptoms.  At her last visit, her Humalog was discontinued and she was started on glargine 20 units twice daily.  She has been able to take this every day without complications.  She denies any signs or symptoms consistent with lows or highs since last being in the clinic.  She does note 1-2 asymptomatic hypoglycemic episodes in which her sugars in the 200s.  She has been taking her glucose in the morning before  bed.  It has consistently been in the 100s to 140s.  She has tried Dexcom in the past but noted that it fell off her arm.  We will trial this again pending insurance coverage. - Continue glargine 20 units twice daily - Begin CGM - Continue Ozempic 1 mg/week--follow-up in 2 weeks to possibly increase dosage   Patient seen with Dr. Rhona Leavens, MD  [facility] Internal Medicine, PGY-1 Date 09/09/2023 Time 1:36 PM

## 2023-09-09 NOTE — Telephone Encounter (Signed)
Decision:Approved [place] (Key: [id number]) PA Case ID #: [id number] Rx #: [id number] Need Help? Call us at [phone] Archived today by Almon Register Outcome Approved today by [facility] 2017 NCPDP Request Reference Number: [id number]. DEXCOM G7 MIS RECEIVER is approved through 03/08/2024. For further questions, call [employer] at [phone]. Authorization Expiration Date: 03/08/2024 Drug Dexcom G7 Receiver device ePA cloud logo Form [employer] Electronic Prior Authorization Form [phone] NCPDP) Original Claim Info 75

## 2023-09-09 NOTE — Assessment & Plan Note (Signed)
Last seen 2 weeks ago. CBC w/ diff, TSH, fecal calprotectin, ESR, and CRP were negative. Pancreatic elastase was decreased at 62. 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 Diarrhea hasn't improved. Haven't heard from GI docs. No steatorrhea. Imodium BID hasn't been helping. Will contact referral coordinator to help expedite process.  -Follow-up with GI

## 2023-09-09 NOTE — Telephone Encounter (Signed)
Prior Authorization for patient [facility] G7 Receiver) came through on cover my meds was submitted with last office notes and labs awaiting approval or denial.  NGE:[id number]

## 2023-09-10 NOTE — Progress Notes (Signed)
Internal Medicine Clinic Attending  I was physically present during the key portions of the resident provided service and participated in the medical decision making of patient's management care. I reviewed pertinent patient test results.  The assessment, diagnosis, and plan were formulated together and I agree with the documentation in the resident's note.  Narendra, Nischal, MD  

## 2023-09-15 NOTE — Progress Notes (Signed)
Entered in error

## 2023-09-16 ENCOUNTER — Encounter: Payer: Self-pay | Admitting: Student

## 2023-09-26 ENCOUNTER — Ambulatory Visit: Payer: Medicaid Other | Admitting: Orthopedic Surgery

## 2023-09-28 ENCOUNTER — Other Ambulatory Visit: Payer: Self-pay | Admitting: Student

## 2023-09-28 DIAGNOSIS — E1142 Type 2 diabetes mellitus with diabetic polyneuropathy: Secondary | ICD-10-CM

## 2023-09-29 ENCOUNTER — Other Ambulatory Visit: Payer: Self-pay

## 2023-09-29 MED ORDER — INSULIN GLARGINE SOLOSTAR 100 UNIT/ML ~~LOC~~ SOPN
28.0000 [IU] | PEN_INJECTOR | Freq: Two times a day (BID) | SUBCUTANEOUS | 3 refills | Status: DC
  Filled 2023-09-29: qty 30, 54d supply, fill #0

## 2023-09-29 NOTE — Telephone Encounter (Signed)
Next appt scheduled tomorrow with Dr Geraldo Pitter.

## 2023-09-30 ENCOUNTER — Encounter: Payer: Self-pay | Admitting: Student

## 2023-09-30 ENCOUNTER — Other Ambulatory Visit (HOSPITAL_COMMUNITY): Payer: Self-pay

## 2023-09-30 ENCOUNTER — Ambulatory Visit: Payer: Medicaid Other | Admitting: Student

## 2023-09-30 ENCOUNTER — Other Ambulatory Visit: Payer: Self-pay

## 2023-09-30 VITALS — BP 128/75 | HR 72 | Temp 98.2°F | Ht 66.0 in | Wt 244.1 lb

## 2023-09-30 DIAGNOSIS — Z794 Long term (current) use of insulin: Secondary | ICD-10-CM | POA: Diagnosis not present

## 2023-09-30 DIAGNOSIS — Z7985 Long-term (current) use of injectable non-insulin antidiabetic drugs: Secondary | ICD-10-CM

## 2023-09-30 DIAGNOSIS — I10 Essential (primary) hypertension: Secondary | ICD-10-CM

## 2023-09-30 DIAGNOSIS — E1142 Type 2 diabetes mellitus with diabetic polyneuropathy: Secondary | ICD-10-CM

## 2023-09-30 DIAGNOSIS — Z7984 Long term (current) use of oral hypoglycemic drugs: Secondary | ICD-10-CM | POA: Diagnosis not present

## 2023-09-30 MED ORDER — OZEMPIC (2 MG/DOSE) 8 MG/3ML ~~LOC~~ SOPN
2.0000 mg | PEN_INJECTOR | SUBCUTANEOUS | 3 refills | Status: DC
Start: 2023-09-30 — End: 2024-01-28
  Filled 2023-09-30 (×2): qty 3, 28d supply, fill #0
  Filled 2023-10-24: qty 3, 28d supply, fill #1
  Filled 2023-11-21: qty 3, 28d supply, fill #2
  Filled 2023-12-22 – 2024-01-02 (×2): qty 3, 28d supply, fill #3

## 2023-09-30 MED ORDER — INSULIN GLARGINE SOLOSTAR 100 UNIT/ML ~~LOC~~ SOPN
30.0000 [IU] | PEN_INJECTOR | Freq: Two times a day (BID) | SUBCUTANEOUS | 3 refills | Status: DC
  Filled 2023-09-30 – 2023-10-22 (×2): qty 30, 50d supply, fill #0
  Filled 2023-12-03: qty 30, 50d supply, fill #1
  Filled 2024-01-05: qty 30, 50d supply, fill #2

## 2023-09-30 NOTE — Patient Instructions (Signed)
  Thank you, [name], for allowing [place] to provide your care today.  You are doing a very good job with your glucose control.  You can continue to take your Lantus at 30 units twice a day but if you are having any blood sugars consistently below 80 in the morning or any other time please call us to discuss changing this dose.  I think increasing your Ozempic will help control some of your mealtime glucose spikes so we will increase it to 2 mg weekly.  I have sent a prescription to the pharmacy today.  Please call our office if you have any issues with your Dexcom.  We will see you back in about 1 to 2 months and we will repeat your A1c as well as review your Dexcom and make any changes that time if needed.   Follow up: 2 months    Remember:     Should you have any questions or concerns please call the internal medicine clinic at [phone].     Rocky Morel, DO [facility]

## 2023-09-30 NOTE — Progress Notes (Unsigned)
CC: Routine Follow Up   HPI:  Amy Norris is a 57 y.o. female with pertinent PMH of HTN, OSA, pulmonary nodules, T2DM, MDD, and chronic diarrhea who presents to the clinic for follow-up on her diabetes. Please see assessment and plan below for further details.  Past Medical History:  Diagnosis Date   Anal fissure    Breast mass    Bronchitis    Carpal tunnel syndrome, bilateral    Cholelithiasis    Depression    Diabetes mellitus    Type II   Essential hypertension    GERD (gastroesophageal reflux disease)    Herpes simplex type 1 infection    vaginal   Hypokalemia    2/2 diarrhea   Menorrhagia    Obesity    Polycystic ovarian disease    Postural dizziness with presyncope 11/29/2013   Renal calculi    Renal calculus    Sleep apnea    no cpap   Sleep apnea, obstructive     Current Outpatient Medications  Medication Instructions   Accu-Chek Softclix Lancets lancets Test 4 (four) times daily.   albuterol (VENTOLIN HFA) 108 (90 Base) MCG/ACT inhaler 2 puffs, Inhalation, Every 6 hours PRN   atorvastatin (LIPITOR) 40 mg, Oral, Daily   benzonatate (TESSALON) 100 mg, Oral, 3 times daily PRN   Blood Glucose Monitoring Suppl (ACCU-CHEK GUIDE) w/Device KIT Test blood sugar daily before breakfast.   colestipol (COLESTID) 2 g, Oral, 2 times daily   Continuous Glucose Receiver (DEXCOM G7 RECEIVER) DEVI Please use this if you are unable to use the SmartPhone app.   Continuous Glucose Sensor (DEXCOM G7 SENSOR) MISC Please attach to your arm. You can wear these for 10 days at a time.   diclofenac Sodium (VOLTAREN) 4 g, Topical, 4 times daily   Farxiga 10 mg, Oral, Daily   FLUoxetine (PROZAC) 40 mg, Oral, Daily   gabapentin (NEURONTIN) 300 mg, Oral, 3 times daily   glucose blood (CONTOUR NEXT TEST) test strip Use to test blood glucose up to 4 times per day.   Insulin Glargine Solostar (LANTUS) 30 Units, Subcutaneous, 2 times daily   Insulin Pen Needle 32G X 6 MM MISC Please use  to inject insulin five times daily.   lisinopril (ZESTRIL) 10 mg, Oral, Daily   Microlet Lancets MISC Use to check blood glucose up to 4 times per day.   Ozempic (2 MG/DOSE) 2 mg, Subcutaneous, Weekly     Review of Systems:   Pertinent items noted in HPI and/or A&P.  Physical Exam:  Vitals:   09/30/23 1408  BP: 128/75  Pulse: 72  Temp: 98.2 F (36.8 C)  TempSrc: Oral  SpO2: 97%  Weight: 244 lb 1.6 oz (110.7 kg)  Height: 5\' 6"  (1.676 m)    Constitutional: Well-appearing adult female. In no acute distress. Cardio:Regular rate and rhythm. 2+ bilateral radial pulses. Pulm:Clear to auscultation bilaterally. Normal work of breathing on room air. ZOX:[id number] for extremity edema. Skin:Warm and dry. Neuro:Alert and oriented x3. No focal deficit noted. Psych:Pleasant mood and affect.   Assessment & Plan:   Type 2 diabetes mellitus with peripheral neuropathy [facility]) Patient presents for a follow-up after being seen on 09/09/2023 and being transition from 75/25 insulin on 08/25/2023.  After last visit she was prescribed Lantus 28 units twice daily, Farxiga 10 mg daily, and Ozempic 1 mg weekly.  She increased her Lantus to 30 units twice daily due to her high postprandial glucoses above 300.  She brings in  her Dexcom CGM and the report shows significant spikes after her 1 meal of the day which she eats around 6 PM.  Fasting glucose is in target range and her average glucose over the last 2 weeks is 146 with 83% in range and 0% lows.  She has not had any side effects on the Ozempic.  She needs improved postprandial control so we will first try to increase her Ozempic but may in the future need mealtime insulin. - Continue Lantus 30 units twice daily, Farxiga 10 mg daily, and increased Ozempic to 2 mg weekly - Return in 1 to 2 months and repeat A1c  Hypertension Blood pressure well-controlled stable today at 128/75. - Continue lisinopril 10 mg daily    Patient discussed with Dr.  Alfonzo Beers, DO Internal [facility] Center Internal Medicine Resident PGY-2 Clinic Phone: [phone] Pager: [phone]

## 2023-10-01 ENCOUNTER — Other Ambulatory Visit (HOSPITAL_COMMUNITY): Payer: Self-pay

## 2023-10-02 NOTE — Assessment & Plan Note (Signed)
Blood pressure well-controlled stable today at 128/75. - Continue lisinopril 10 mg daily

## 2023-10-02 NOTE — Assessment & Plan Note (Signed)
Patient presents for a follow-up after being seen on 09/09/2023 and being transition from 75/25 insulin on 08/25/2023.  After last visit she was prescribed Lantus 28 units twice daily, Farxiga 10 mg daily, and Ozempic 1 mg weekly.  She increased her Lantus to 30 units twice daily due to her high postprandial glucoses above 300.  She brings in her Dexcom CGM and the report shows significant spikes after her 1 meal of the day which she eats around 6 PM.  Fasting glucose is in target range and her average glucose over the last 2 weeks is 146 with 83% in range and 0% lows.  She has not had any side effects on the Ozempic.  She needs improved postprandial control so we will first try to increase her Ozempic but may in the future need mealtime insulin. - Continue Lantus 30 units twice daily, Farxiga 10 mg daily, and increased Ozempic to 2 mg weekly - Return in 1 to 2 months and repeat A1c

## 2023-10-03 ENCOUNTER — Other Ambulatory Visit (HOSPITAL_COMMUNITY): Payer: Self-pay

## 2023-10-03 NOTE — Addendum Note (Signed)
Addended by: Dickie La on: 10/03/2023 08:49 AM   Modules accepted: Level of Service

## 2023-10-03 NOTE — Progress Notes (Signed)
Internal Medicine Clinic Attending  Case discussed with the resident at the time of the visit.  We reviewed the resident's history and exam and pertinent patient test results.  I agree with the assessment, diagnosis, and plan of care documented in the resident's note.  

## 2023-10-17 ENCOUNTER — Other Ambulatory Visit (HOSPITAL_COMMUNITY): Payer: Self-pay

## 2023-10-21 ENCOUNTER — Ambulatory Visit: Payer: Medicaid Other | Admitting: Internal Medicine

## 2023-10-21 ENCOUNTER — Other Ambulatory Visit (HOSPITAL_COMMUNITY): Payer: Self-pay

## 2023-10-21 ENCOUNTER — Encounter: Payer: Self-pay | Admitting: Internal Medicine

## 2023-10-21 VITALS — BP 133/79 | HR 79 | Temp 98.2°F | Ht 66.0 in | Wt 245.3 lb

## 2023-10-21 DIAGNOSIS — E1141 Type 2 diabetes mellitus with diabetic mononeuropathy: Secondary | ICD-10-CM | POA: Diagnosis not present

## 2023-10-21 DIAGNOSIS — E1142 Type 2 diabetes mellitus with diabetic polyneuropathy: Secondary | ICD-10-CM

## 2023-10-21 DIAGNOSIS — Z7984 Long term (current) use of oral hypoglycemic drugs: Secondary | ICD-10-CM | POA: Diagnosis not present

## 2023-10-21 DIAGNOSIS — Z7985 Long-term (current) use of injectable non-insulin antidiabetic drugs: Secondary | ICD-10-CM | POA: Diagnosis not present

## 2023-10-21 DIAGNOSIS — Z794 Long term (current) use of insulin: Secondary | ICD-10-CM

## 2023-10-21 DIAGNOSIS — I10 Essential (primary) hypertension: Secondary | ICD-10-CM | POA: Diagnosis not present

## 2023-10-21 DIAGNOSIS — G589 Mononeuropathy, unspecified: Secondary | ICD-10-CM

## 2023-10-21 LAB — POCT GLYCOSYLATED HEMOGLOBIN (HGB A1C): Hemoglobin A1C: 6.5 % — AB (ref 4.0–5.6)

## 2023-10-21 LAB — GLUCOSE, CAPILLARY: Glucose-Capillary: 90 mg/dL (ref 70–99)

## 2023-10-21 NOTE — Patient Instructions (Signed)
Thank you, Ms.Zakaiya P Dillenburg for allowing [place] to provide your care today.   Diabetes Please switch to Lantus 30 units 2 times daily. Your A1c looks great today! Follow-up in 1 month to make sure you are not having more low blood sugars.  Blood pressure Was a little on the high side today/ The goal is <130/90. We will recheck this next month and may adjust meds if still high.  Mononeuropathy This seems to be in one nerve distribution. I think this could be related to activities that you are doing. Please note if you are putting pressure on outside of leg. If remains bothersome we can refer you to sports medicine next month    I have ordered the following medication/changed the following medications:   Stop the following medications: There are no discontinued medications.   Start the following medications: No orders of the defined types were placed in this encounter.    Follow up:  16month     We look forward to seeing you next time. Please call our clinic at [phone] if you have any questions or concerns. The best time to call is Monday-Friday from 9am-4pm, but there is someone available 24/7. If after hours or the weekend, call the main hospital number and ask for the Internal Medicine Resident On-Call. If you need medication refills, please notify your pharmacy one week in advance and they will send [place] a request.   Thank you for trusting me with your care. Wishing you the best!   Rudene Christians, DO [facility]

## 2023-10-21 NOTE — Assessment & Plan Note (Signed)
Patient describes a 6 month history of numbness in her lower right extremity that she describes as cold water over her foot and lower leg. She attributes it to a history of multiple ankle sprains, but notes that she has not sprained it for a few years. She finds the numbness bothersome and is hesitant to drive as she is worried that the numbness will make her an unsafe driver.  A: On exam today, there was decreased vibratory sense along the L5 dermatomal distribution of the lower right leg, but intact strength throughout. Her most recent hemoglobin, calcium, creatinine, TSH, sed rate, and CRP values from the last 6 months are within normal limits. She reports that she does not wear tight pants with belts or sit with her legs crossed, making compression of the peroneal or the lateral femoral cutaneous nerve less likely. Also possible is an atypical, mononeuropathic presentation of neuropathy, but this feels different from her known peripheral neuropathy.  P: If persists and remains bothersome by next month, could consider further evaluation with sports medicine.   - Consider referral to sports medicine if persists

## 2023-10-21 NOTE — Assessment & Plan Note (Signed)
Blood pressure today was elevated at 151/89 and repeat blood pressure was 133/79. Because she has been stable for over 1 year on lisinopril 10 mg daily, will continue for now and recheck in 1 month for if titration is necessary.   - Continue lisinopril 10 mg daily - Recheck blood pressure at next visit in 1 mo

## 2023-10-21 NOTE — Assessment & Plan Note (Signed)
Patient presents today for a 2 month follow up for diabetes management. She was last seen on 09/30/2023. Her A1c today is 6.5, decreased and below goal compared to the previous A1c on 7/15 of 7.2. Her foot exam today is notable for numbness, described below, although she can still feel all 10 points of the monofilament exam. She is currently prescribed lantus 30 units BID, Farxiga 10 mg daily, and Ozempic 2 mg weekly. However, she was unable to pick up her lantus as she ran out of money last month and has been taking 50/45 units of her previous NPH 70/30 that she had left over in her fridge. She reports that she will be taking the lantus again as soon as she can pick it up. Her CGM today shows average glucose is at 145 with 81% in range and 1% lows. She reports that she is sometimes shaky with low values in the 60s. Her highs are generally around 9PM after dinner. She is continuing to experience no side effects and is tolerating the increased dose of 2 mg Ozempic well. Given her symptomatic hypoglycemia, we have encouraged her to switch back to lantus 30 units BID and to follow up in 1 month to check if the hypoglycemia episodes have resolved.   - Continue Lantus 30 units BID - Continue Farxiga 10 mg daily - Continue Ozempic to 2 mg weekly - Continue gabapentin 300 mg TID - F/up in 1 mo for hypoglycemia management

## 2023-10-21 NOTE — Progress Notes (Addendum)
This is a Psychologist, occupational Note.  The care of the patient was discussed with Dr. Antony Contras and the assessment and plan was formulated with their assistance.  Please see their note for official documentation of the patient encounter.   Subjective:   Patient ID: Amy Norris female   DOB: 26-Aug-1966 57 y.o.   MRN: [id number]  HPI: [name] is a 57 y.o. female with a PMH of HTN, OSA, pulmonary nodules, MDD, and chronic diarrhea who presents to the clinic for follow-up on her diabetes.   Past Medical History:  Diagnosis Date   Anal fissure    Breast mass    Bronchitis    Carpal tunnel syndrome, bilateral    Cholelithiasis    Depression    Diabetes mellitus    Type II   Essential hypertension    GERD (gastroesophageal reflux disease)    Herpes simplex type 1 infection    vaginal   Hypokalemia    2/2 diarrhea   Menorrhagia    Obesity    Polycystic ovarian disease    Postural dizziness with presyncope 11/29/2013   Renal calculi    Renal calculus    Sleep apnea    no cpap   Sleep apnea, obstructive    Current Outpatient Medications  Medication Sig Dispense Refill   Accu-Chek Softclix Lancets lancets Test 4 (four) times daily. 100 each 12   albuterol (VENTOLIN HFA) 108 (90 Base) MCG/ACT inhaler Inhale 2 puffs into the lungs every 6 (six) hours as needed for wheezing or shortness of breath. 6.7 g 2   atorvastatin (LIPITOR) 40 MG tablet Take 1 tablet (40 mg total) by mouth daily. 90 tablet 2   benzonatate (TESSALON) 100 MG capsule Take 1 capsule (100 mg total) by mouth 3 (three) times daily as needed for cough. 30 capsule 0   Blood Glucose Monitoring Suppl (ACCU-CHEK GUIDE) w/Device KIT Test blood sugar daily before breakfast. 1 kit 0   colestipol (COLESTID) 1 g tablet Take 2 tablets (2 g total) by mouth 2 (two) times daily. 120 tablet 2   Continuous Glucose Receiver (DEXCOM G7 RECEIVER) DEVI Please use this if you are unable to use the SmartPhone app. 1 each 0    Continuous Glucose Sensor (DEXCOM G7 SENSOR) MISC Please attach to your arm. You can wear these for 10 days at a time. 9 each 3   dapagliflozin propanediol (FARXIGA) 10 MG TABS tablet Take 1 tablet (10 mg total) by mouth daily. 30 tablet 11   diclofenac Sodium (VOLTAREN) 1 % GEL Apply 4 g topically 4 (four) times daily. 100 g 1   FLUoxetine (PROZAC) 40 MG capsule Take 1 capsule (40 mg total) by mouth daily. 90 capsule 3   gabapentin (NEURONTIN) 300 MG capsule Take 1 capsule (300 mg total) by mouth 3 (three) times daily. 90 capsule 2   glucose blood (CONTOUR NEXT TEST) test strip Use to test blood glucose up to 4 times per day. 200 strip 12   Insulin Glargine Solostar (LANTUS) 100 UNIT/ML Solostar Pen Inject 30 Units into the skin 2 (two) times daily. 30 mL 3   Insulin Pen Needle 32G X 6 MM MISC Please use to inject insulin five times daily. 500 each 11   lisinopril (ZESTRIL) 10 MG tablet Take 1 tablet (10 mg total) by mouth daily. 90 tablet 3   Microlet Lancets MISC Use to check blood glucose up to 4 times per day. 200 each 12   Semaglutide, 2 MG/DOSE, (  OZEMPIC, 2 MG/DOSE,) 8 MG/3ML SOPN Inject 2 mg into the skin once a week. 3 mL 3   No current facility-administered medications for this visit.   Family History  Problem Relation Age of Onset   Hypertension Mother    Breast cancer Mother    Diabetes Father    Breast cancer Maternal Aunt    Colon cancer Neg Hx    Esophageal cancer Neg Hx    Stomach cancer Neg Hx    Rectal cancer Neg Hx    Social History   Socioeconomic History   Marital status: Married    Spouse name: Not on file   Number of children: Not on file   Years of education: Not on file   Highest education level: Not on file  Occupational History   Occupation: Deli    Employer: [employer]  Tobacco Use   Smoking status: Former    Current packs/day: 0.00    Types: Cigarettes    Quit date: 12/30/1978    Years since quitting: 44.8   Smokeless tobacco: Never  Vaping Use    Vaping status: Never Used  Substance and Sexual Activity   Alcohol use: Not Currently    Comment: occ.   Drug use: No   Sexual activity: Yes    Birth control/protection: Post-menopausal  Other Topics Concern   Not on file  Social History Narrative    Smoked many years ago drinks 1-2 beers on Friday and Saturdays no illegal drug use. Married and lives with her husband and sister-in-law son daughter and grandson.   Financial assistance approved for 100% discount at [facility] and has [facility] card per Rudell Cobb as of August 06, 2010 6:27 PM   Social Determinants of Health   Financial Resource Strain: Not on file  Food Insecurity: No Food Insecurity (04/11/2022)   Hunger Vital Sign    Worried About Running Out of Food in the Last Year: Never true    Ran Out of Food in the Last Year: Never true  Transportation Needs: No Transportation Needs (04/11/2022)   PRAPARE - Administrator, Civil Service (Medical): No    Lack of Transportation (Non-Medical): No  Physical Activity: Not on file  Stress: Not on file  Social Connections: Not on file   Review of Systems: Pertinent items noted in HPI and remainder of comprehensive ROS otherwise negative.  Objective:  Physical Exam: Vitals:   10/21/23 1433  BP: (!) 151/89  Pulse: 79  Temp: 98.2 F (36.8 C)  TempSrc: Oral  SpO2: 95%  Weight: 245 lb 4.8 oz (111.3 kg)  Height: 5\' 6"  (1.676 m)    BP 133/79 (BP Location: Left Arm, Patient Position: Sitting, Cuff Size: Normal)   Pulse 79   Temp 98.2 F (36.8 C) (Oral)   Ht 5\' 6"  (1.676 m)   Wt 245 lb 4.8 oz (111.3 kg)   LMP 10/02/2020 (Approximate)   SpO2 95%   BMI 39.59 kg/m   General appearance: alert, cooperative, and no distress Lungs: clear to auscultation bilaterally Heart: regular rate and rhythm, S1, S2 normal, no murmur, click, rub or gallop Extremities:  DP and TP pulses present. Vibratory sense decreased on the right lower leg in an L5 dermatomal distribution. Intact  vibration sense on medial aspect of right leg and on leg lower leg. Intact sensation to touch in bilateral lower legs. No notable skin breakdown or weakness of the bilateral lower extremities.  Assessment & Plan:  Type 2 diabetes mellitus  with peripheral neuropathy [facility]) Patient presents today for a 2 month follow up for diabetes management. She was last seen on 09/30/2023. Her A1c today is 6.5, decreased and below goal compared to the previous A1c on 7/15 of 7.2. Her foot exam today is notable for numbness, described below, although she can still feel all 10 points of the monofilament exam. She is currently prescribed lantus 30 units BID, Farxiga 10 mg daily, and Ozempic 2 mg weekly. However, she was unable to pick up her lantus as she ran out of money last month and has been taking 50/45 units of her previous NPH 70/30 that she had left over in her fridge. She reports that she will be taking the lantus again as soon as she can pick it up. Her CGM today shows average glucose is at 145 with 81% in range and 1% lows. She reports that she is sometimes shaky with low values in the 60s. Her highs are generally around 9PM after dinner. She is continuing to experience no side effects and is tolerating the increased dose of 2 mg Ozempic well. Given her symptomatic hypoglycemia, we have encouraged her to switch back to lantus 30 units BID and to follow up in 1 month to check if the hypoglycemia episodes have resolved.   - Continue Lantus 30 units BID - Continue Farxiga 10 mg daily - Continue Ozempic to 2 mg weekly - Continue gabapentin 300 mg TID - F/up in 1 mo for hypoglycemia management   Mononeuropathy  Patient describes a 6 month history of numbness in her lower right extremity that she describes as cold water over her foot and lower leg. She attributes it to a history of multiple ankle sprains, but notes that she has not sprained it for a few years. She finds the numbness bothersome and is hesitant to drive  as she is worried that the numbness will make her an unsafe driver.  A: On exam today, there was decreased vibratory sense along the L5 dermatomal distribution of the lower right leg, but intact strength throughout. Her most recent hemoglobin, calcium, creatinine, TSH, sed rate, and CRP values from the last 6 months are within normal limits. She reports that she does not wear tight pants with belts or sit with her legs crossed, making compression of the peroneal or the lateral femoral cutaneous nerve less likely. Also possible is an atypical, mononeuropathic presentation of neuropathy, but this feels different from her known peripheral neuropathy.  P: If persists and remains bothersome by next month, could consider further evaluation with sports medicine.   - Consider referral to sports medicine if persists  Hypertension Blood pressure today was elevated at 151/89 and repeat blood pressure was 133/79. Because she has been stable for over 1 year on lisinopril 10 mg daily, will continue for now and recheck in 1 month for if titration is necessary.   - Continue lisinopril 10 mg daily - Recheck blood pressure at next visit in 1 mo   Thea Alken, MS3

## 2023-10-22 ENCOUNTER — Other Ambulatory Visit: Payer: Self-pay

## 2023-10-22 ENCOUNTER — Other Ambulatory Visit (HOSPITAL_COMMUNITY): Payer: Self-pay

## 2023-10-23 NOTE — Addendum Note (Signed)
Addended by: Burnell Blanks on: 10/23/2023 01:57 PM   Modules accepted: Level of Service

## 2023-10-23 NOTE — Progress Notes (Signed)
Internal Medicine Clinic Attending  Case discussed with the resident at the time of the visit.  We reviewed the resident's history and exam and pertinent patient test results.  I agree with the assessment, diagnosis, and plan of care documented in the resident's note.  

## 2023-10-27 ENCOUNTER — Other Ambulatory Visit (HOSPITAL_COMMUNITY): Payer: Self-pay

## 2023-10-27 ENCOUNTER — Ambulatory Visit (INDEPENDENT_AMBULATORY_CARE_PROVIDER_SITE_OTHER): Payer: Medicaid Other | Admitting: Orthopedic Surgery

## 2023-10-27 ENCOUNTER — Other Ambulatory Visit (INDEPENDENT_AMBULATORY_CARE_PROVIDER_SITE_OTHER): Payer: Self-pay

## 2023-10-27 ENCOUNTER — Other Ambulatory Visit: Payer: Self-pay

## 2023-10-27 DIAGNOSIS — M79642 Pain in left hand: Secondary | ICD-10-CM

## 2023-10-27 DIAGNOSIS — M65332 Trigger finger, left middle finger: Secondary | ICD-10-CM

## 2023-10-27 NOTE — Progress Notes (Signed)
Amy Norris - 57 y.o. female MRN [id number]  Date of birth: Jul 26, 1966  Office Visit Note: Visit Date: 10/27/2023 PCP: Rudene Christians, DO Referred by: Mercie Eon, MD  Subjective: No chief complaint on file.  HPI: Amy Norris is a pleasant 57 y.o. female who presents today for evaluation of left hand long finger trigger digit that has been present now for multiple years.  She has undergone prior injections x 2, which have provided transient but not lasting relief.  She has a palpable nodule at the palmar aspect of the A1 of the left long finger with associated clicking, no active locking currently.  Pertinent ROS were reviewed with the patient and found to be negative unless otherwise specified above in HPI.   Visit Reason: left hand; middle finger triggering Duration of symptoms: 3+ years Hand dominance: left Occupation: disabled? Diabetic: Yes/ 6.5 Smoking: No Heart/Lung History: none Blood Thinners:  none  Prior Testing/EMG: none Injections (Date): 2- unsure of last one Treatments: injection Prior Surgery:none  Assessment & Plan: Visit Diagnoses:  1. Pain in left hand     Plan: Extensive discussion without the patient today regarding her left long finger trigger digit.  Given that her symptoms have been present now for an extended period of time and are refractory to conservative care in the form of activity modification and prior injections, she is indicated for left long finger trigger digit release under local anesthesia.  Risks and benefits of the procedure were discussed, risks including but not limited to infection, bleeding, scarring, stiffness, nerve injury, tendon injury, vascular injury, recurrence of symptoms and need for subsequent operation.  Patient expressed understanding.      Follow-up: No follow-ups on file.   Meds & Orders: No orders of the defined types were placed in this encounter.   Orders Placed This Encounter  Procedures   XR  Hand Complete Left     Procedures: No procedures performed      Clinical History: No specialty comments available.  She reports that she quit smoking about 44 years ago. Her smoking use included cigarettes. She has never used smokeless tobacco.  Recent Labs    03/12/23 1521 07/14/23 1414 10/21/23 1428  HGBA1C 7.8* 7.2* 6.5*    Objective:   Vital Signs: LMP 10/02/2020 (Approximate)   Physical Exam  Gen: Well-appearing, in no acute distress; non-toxic CV: Regular Rate. Well-perfused. Warm.  Resp: Breathing unlabored on room air; no wheezing. Psych: Fluid speech in conversation; appropriate affect; normal thought process  Ortho Exam Left hand: - Palpable nodule at the base of the long finger at the level of the A1 pulley, associated tenderness, notable clicking with deep flexion - Able to perform full digital range of motion without locking - Sensation intact distally, hand is warm well-perfused   Imaging: No results found.  Past Medical/Family/Surgical/Social History: Medications & Allergies reviewed per EMR, new medications updated. 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 07/14/2020   Breast lump on right side at 1 o'clock position 05/16/2020   Chronic diarrhea 03/09/2020   Pulmonary nodule 02/12/2019   Osteoarthritis 05/12/2018   Diabetic polyneuropathy (HCC) 02/02/2017   Trigger finger 02/04/2014   Dizziness 11/29/2013   Hypercholesteremia 11/05/2012   Cystitis 04/10/2012  Hypertension 01/14/2008   Type 2 diabetes mellitus with peripheral neuropathy (HCC) 12/04/2006   Morbid obesity (HCC) 12/04/2006   Depression, major, recurrent, in remission (HCC) 12/04/2006   SLEEP APNEA, OBSTRUCTIVE 12/04/2006    Carpal tunnel syndrome 12/04/2006   GERD 12/04/2006   Past Medical History:  Diagnosis Date   Anal fissure    Breast mass    Bronchitis    Carpal tunnel syndrome, bilateral    Cholelithiasis    Depression    Diabetes mellitus    Type II   Essential hypertension    GERD (gastroesophageal reflux disease)    Herpes simplex type 1 infection    vaginal   Hypokalemia    2/2 diarrhea   Menorrhagia    Obesity    Polycystic ovarian disease    Postural dizziness with presyncope 11/29/2013   Renal calculi    Renal calculus    Sleep apnea    no cpap   Sleep apnea, obstructive    Family History  Problem Relation Age of Onset   Hypertension Mother    Breast cancer Mother    Diabetes Father    Breast cancer Maternal Aunt    Colon cancer Neg Hx    Esophageal cancer Neg Hx    Stomach cancer Neg Hx    Rectal cancer Neg Hx    Past Surgical History:  Procedure Laterality Date   CHOLECYSTECTOMY  11/08   TUBAL LIGATION  1991   1991   Social History   Occupational History   Occupation: Brewing technologist: [employer]  Tobacco Use   Smoking status: Former    Current packs/day: 0.00    Types: Cigarettes    Quit date: 12/30/1978    Years since quitting: 44.8   Smokeless tobacco: Never  Vaping Use   Vaping status: Never Used  Substance and Sexual Activity   Alcohol use: Not Currently    Comment: occ.   Drug use: No   Sexual activity: Yes    Birth control/protection: Post-menopausal    Anshul Fara Boros) Denese Killings, M.D. [facility] 10:30 PM

## 2023-10-31 ENCOUNTER — Other Ambulatory Visit (HOSPITAL_COMMUNITY): Payer: Self-pay

## 2023-11-03 ENCOUNTER — Other Ambulatory Visit (HOSPITAL_COMMUNITY): Payer: Self-pay

## 2023-11-03 ENCOUNTER — Other Ambulatory Visit: Payer: Self-pay

## 2023-11-10 ENCOUNTER — Other Ambulatory Visit (HOSPITAL_COMMUNITY): Payer: Self-pay

## 2023-11-18 DIAGNOSIS — M65332 Trigger finger, left middle finger: Secondary | ICD-10-CM

## 2023-11-19 ENCOUNTER — Encounter: Payer: Self-pay | Admitting: Student

## 2023-11-19 ENCOUNTER — Ambulatory Visit: Payer: Medicaid Other | Admitting: Student

## 2023-11-19 ENCOUNTER — Other Ambulatory Visit (HOSPITAL_COMMUNITY): Payer: Self-pay

## 2023-11-19 VITALS — BP 118/61 | HR 72 | Temp 98.2°F | Ht 66.0 in | Wt 245.1 lb

## 2023-11-19 DIAGNOSIS — M6530 Trigger finger, unspecified finger: Secondary | ICD-10-CM

## 2023-11-19 DIAGNOSIS — K8681 Exocrine pancreatic insufficiency: Secondary | ICD-10-CM

## 2023-11-19 DIAGNOSIS — E1142 Type 2 diabetes mellitus with diabetic polyneuropathy: Secondary | ICD-10-CM | POA: Diagnosis present

## 2023-11-19 DIAGNOSIS — I10 Essential (primary) hypertension: Secondary | ICD-10-CM | POA: Diagnosis not present

## 2023-11-19 DIAGNOSIS — K529 Noninfective gastroenteritis and colitis, unspecified: Secondary | ICD-10-CM

## 2023-11-19 MED ORDER — PANCRELIPASE (LIP-PROT-AMYL) 36000-114000 UNITS PO CPEP
36000.0000 [IU] | ORAL_CAPSULE | Freq: Three times a day (TID) | ORAL | 3 refills | Status: DC
  Filled 2023-11-19: qty 100, 34d supply, fill #0
  Filled 2023-12-03: qty 100, 33d supply, fill #1

## 2023-11-19 NOTE — Patient Instructions (Addendum)
Thank you, Ms.Lacrecia P Koslow for allowing [place] to provide your care today.   Congratulations on having such good control of your blood pressure and your diabetes.   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 This is treated with a medication called Creon, which you will take   2 capsules with meals 1capsule with snacks through out the day  STOP taking Imodium so that we know how well this medication is working  Please follow-up in: 4 weeks to follow up your symptoms    We look forward to seeing you next time. Please call our clinic at [phone] if you have any questions or concerns. The best time to call is Monday-Friday from 9am-4pm, but there is someone available 24/7. If after hours or the weekend, call the main hospital number and ask for the Internal Medicine Resident On-Call. If you need medication refills, please notify your pharmacy one week in advance and they will send [place] a request.   Thank you for letting us take part in your care. Wishing you the best!  Morene Crocker, MD 11/19/2023, 2:54 PM [facility]

## 2023-11-21 ENCOUNTER — Encounter: Payer: Self-pay | Admitting: Student

## 2023-11-21 NOTE — Assessment & Plan Note (Signed)
AT goal with current medication.  -No changes to Lisinopril 10 mg daily

## 2023-11-21 NOTE — Assessment & Plan Note (Signed)
S/p trigger finger release with Anshul Fara Boros) Agarwala, M.D. on 11/19 and doing well. No pain at this time. Will be following up in [facility] office

## 2023-11-21 NOTE — Progress Notes (Signed)
Subjective:  CC: chronic medication follow up  HPI:  Amy Norris is a 57 y.o. female with a past medical history stated below and presents today for hypertension, hypoglycemia on DM treatment, and chronic diarrhea. Please see problem based assessment and plan for additional details.  Past Medical History:  Diagnosis Date   Anal fissure    Breast mass    Bronchitis    Carpal tunnel syndrome, bilateral    Cholelithiasis    Depression    Diabetes mellitus    Type II   Essential hypertension    GERD (gastroesophageal reflux disease)    Herpes simplex type 1 infection    vaginal   Hypokalemia    2/2 diarrhea   Menorrhagia    Obesity    Polycystic ovarian disease    Postural dizziness with presyncope 11/29/2013   Renal calculi    Renal calculus    Sleep apnea    no cpap   Sleep apnea, obstructive     Current Outpatient Medications on File Prior to Visit  Medication Sig Dispense Refill   Accu-Chek Softclix Lancets lancets Test 4 (four) times daily. 100 each 12   albuterol (VENTOLIN HFA) 108 (90 Base) MCG/ACT inhaler Inhale 2 puffs into the lungs every 6 (six) hours as needed for wheezing or shortness of breath. 6.7 g 2   atorvastatin (LIPITOR) 40 MG tablet Take 1 tablet (40 mg total) by mouth daily. 90 tablet 2   benzonatate (TESSALON) 100 MG capsule Take 1 capsule (100 mg total) by mouth 3 (three) times daily as needed for cough. 30 capsule 0   Blood Glucose Monitoring Suppl (ACCU-CHEK GUIDE) w/Device KIT Test blood sugar daily before breakfast. 1 kit 0   colestipol (COLESTID) 1 g tablet Take 2 tablets (2 g total) by mouth 2 (two) times daily. 120 tablet 2   Continuous Glucose Receiver (DEXCOM G7 RECEIVER) DEVI Please use this if you are unable to use the SmartPhone app. 1 each 0   Continuous Glucose Sensor (DEXCOM G7 SENSOR) MISC Please attach to your arm. You can wear these for 10 days at a time. 9 each 3   dapagliflozin propanediol (FARXIGA) 10 MG TABS tablet  Take 1 tablet (10 mg total) by mouth daily. 30 tablet 11   diclofenac Sodium (VOLTAREN) 1 % GEL Apply 4 g topically 4 (four) times daily. 100 g 1   FLUoxetine (PROZAC) 40 MG capsule Take 1 capsule (40 mg total) by mouth daily. 90 capsule 3   gabapentin (NEURONTIN) 300 MG capsule Take 1 capsule (300 mg total) by mouth 3 (three) times daily. 90 capsule 2   glucose blood (CONTOUR NEXT TEST) test strip Use to test blood glucose up to 4 times per day. 200 strip 12   Insulin Glargine Solostar (LANTUS) 100 UNIT/ML Solostar Pen Inject 30 Units into the skin 2 (two) times daily. 30 mL 3   Insulin Pen Needle 32G X 6 MM MISC Please use to inject insulin five times daily. 500 each 11   lisinopril (ZESTRIL) 10 MG tablet Take 1 tablet (10 mg total) by mouth daily. 90 tablet 3   Microlet Lancets MISC Use to check blood glucose up to 4 times per day. 200 each 12   Semaglutide, 2 MG/DOSE, (OZEMPIC, 2 MG/DOSE,) 8 MG/3ML SOPN Inject 2 mg into the skin once a week. 3 mL 3   No current facility-administered medications on file prior to visit.    Family History  Problem Relation Age of  Onset   Hypertension Mother    Breast cancer Mother    Diabetes Father    Breast cancer Maternal Aunt    Colon cancer Neg Hx    Esophageal cancer Neg Hx    Stomach cancer Neg Hx    Rectal cancer Neg Hx     Social History   Socioeconomic History   Marital status: Married    Spouse name: Not on file   Number of children: Not on file   Years of education: Not on file   Highest education level: Not on file  Occupational History   Occupation: Deli    Employer: [employer]  Tobacco Use   Smoking status: Former    Current packs/day: 0.00    Types: Cigarettes    Quit date: 12/30/1978    Years since quitting: 44.9   Smokeless tobacco: Never  Vaping Use   Vaping status: Never Used  Substance and Sexual Activity   Alcohol use: Not Currently    Comment: occ.   Drug use: No   Sexual activity: Yes    Birth  control/protection: Post-menopausal  Other Topics Concern   Not on file  Social History Narrative    Smoked many years ago drinks 1-2 beers on Friday and Saturdays no illegal drug use. Married and lives with her husband and sister-in-law son daughter and grandson.   Financial assistance approved for 100% discount at [facility] and has [facility] card per Rudell Cobb as of August 06, 2010 6:27 PM   Social Determinants of Health   Financial Resource Strain: Not on file  Food Insecurity: No Food Insecurity (04/11/2022)   Hunger Vital Sign    Worried About Running Out of Food in the Last Year: Never true    Ran Out of Food in the Last Year: Never true  Transportation Needs: No Transportation Needs (04/11/2022)   PRAPARE - Administrator, Civil Service (Medical): No    Lack of Transportation (Non-Medical): No  Physical Activity: Not on file  Stress: Not on file  Social Connections: Not on file  Intimate Partner Violence: Not on file    Review of Systems: ROS negative except for what is noted on the assessment and plan.  Objective:   Vitals:   11/19/23 1405  BP: 118/61  Pulse: 72  Temp: 98.2 F (36.8 C)  TempSrc: Oral  SpO2: 97%  Weight: 245 lb 1.6 oz (111.2 kg)  Height: 5\' 6"  (1.676 m)    Physical Exam: Constitutional: well-appearing woman sitting in chair, in no acute distress HENT: normocephalic atraumatic, mucous membranes moist Eyes: conjunctiva non-erythematous Neck: supple Cardiovascular: regular rate and rhythm, no m/r/g Pulmonary/Chest: normal work of breathing on room air, lungs clear to auscultation bilaterally Abdominal: soft, non-tender, non-distended MSK: normal bulk and tone Neurological: alert & oriented x 3, bandage over left hand after trigger finger release surgery Skin: warm and dry Psych: Pleasant mood and affect       11/19/2023    2:09 PM  Depression screen PHQ 2/9  Decreased Interest 0  Down, Depressed, Hopeless 0  PHQ - 2 Score 0        09/09/2023   12:16 PM 07/14/2023    4:25 PM  GAD 7 : Generalized Anxiety Score  Nervous, Anxious, on Edge 0 0  Control/stop worrying 0 0  Worry too much - different things 0 0  Trouble relaxing 0 0  Restless 0 0  Easily annoyed or irritable 0 0  Afraid - awful  might happen 0 0  Total GAD 7 Score 0 0  Anxiety Difficulty  Not difficult at all     Assessment & Plan:   Hypertension AT goal with current medication.  -No changes to Lisinopril 10 mg daily  Type 2 diabetes mellitus with peripheral neuropathy (HCC) On Lantus 30 BID, farxiga 10 mg daily, Ozempic 2 mg weekly, and statin. No hypoglycemic episodes documented or symptomatic.  - No changes in management today  Exocrine pancreatic insufficiency Chronic work up significant for fecal elastase of < 62, consistent with exocrine pancreatic insufficiency. Has not initiated treatment; continued to experience loose stools after meals managed with large doses of imodium Refer to Creon website-> how to take creon instructions for dosing. Based on her current weight, started Creon 36,000 units of lipase. Patient is to take 2 pills with meals and 1 pill with snacks. - Return in 4-6 weeks for medication follow up and symptom control -Please refer to Creon website if Creon dose needs adjustement   Trigger finger S/p trigger finger release with Anshul Fara Boros) Agarwala, M.D. on 11/19 and doing well. No pain at this time. Will be following up in [facility] office    Return in about 4 weeks (around 12/17/2023) for Pancreatic insufficiency.  Patient discussed with Dr. Koren Bound, MD [facility]  11/21/2023, 11:49 AM

## 2023-11-21 NOTE — Assessment & Plan Note (Signed)
On Lantus 30 BID, farxiga 10 mg daily, Ozempic 2 mg weekly, and statin. No hypoglycemic episodes documented or symptomatic.  - No changes in management today

## 2023-11-21 NOTE — Assessment & Plan Note (Addendum)
Chronic work up significant for fecal elastase of < 62, consistent with exocrine pancreatic insufficiency. Has not initiated treatment; continued to experience loose stools after meals managed with large doses of imodium Refer to Creon website-> how to take creon instructions for dosing. Based on her current weight, started Creon 36,000 units of lipase. Patient is to take 2 pills with meals and 1 pill with snacks. - Return in 4-6 weeks for medication follow up and symptom control -Please refer to Creon website if Creon dose needs adjustement

## 2023-11-24 ENCOUNTER — Other Ambulatory Visit: Payer: Self-pay

## 2023-11-24 ENCOUNTER — Other Ambulatory Visit: Payer: Self-pay | Admitting: Internal Medicine

## 2023-11-24 ENCOUNTER — Other Ambulatory Visit (HOSPITAL_COMMUNITY): Payer: Self-pay

## 2023-11-24 DIAGNOSIS — E1142 Type 2 diabetes mellitus with diabetic polyneuropathy: Secondary | ICD-10-CM

## 2023-11-24 MED ORDER — GABAPENTIN 300 MG PO CAPS
300.0000 mg | ORAL_CAPSULE | Freq: Three times a day (TID) | ORAL | 3 refills | Status: DC
  Filled 2023-11-24: qty 90, 30d supply, fill #0
  Filled 2023-12-24: qty 90, 30d supply, fill #1
  Filled 2024-01-28: qty 90, 30d supply, fill #2
  Filled 2024-02-27: qty 90, 30d supply, fill #3

## 2023-11-24 NOTE — Telephone Encounter (Signed)
Next appt scheduled 12/18 with Dr Annie Paras.

## 2023-11-25 HISTORY — PX: HAND SURGERY: SHX662

## 2023-11-26 ENCOUNTER — Encounter: Payer: Medicaid Other | Admitting: Orthopedic Surgery

## 2023-11-26 NOTE — Progress Notes (Signed)
Internal Medicine Clinic Attending  Case discussed with the resident at the time of the visit.  We reviewed the resident's history and exam and pertinent patient test results.  I agree with the assessment, diagnosis, and plan of care documented in the resident's note.  

## 2023-12-01 ENCOUNTER — Other Ambulatory Visit (HOSPITAL_COMMUNITY): Payer: Self-pay

## 2023-12-01 ENCOUNTER — Ambulatory Visit (INDEPENDENT_AMBULATORY_CARE_PROVIDER_SITE_OTHER): Payer: Medicaid Other | Admitting: Orthopedic Surgery

## 2023-12-01 DIAGNOSIS — Z9889 Other specified postprocedural states: Secondary | ICD-10-CM

## 2023-12-01 NOTE — Progress Notes (Signed)
   Amy Norris - 57 y.o. female MRN [id number]  Date of birth: [birth year]  Office Visit Note: Visit Date: 12/01/2023 PCP: Rudene Christians, DO Referred by: Masters, Psychiatric nurse, DO  Subjective:  HPI: Amy Norris is a 57 y.o. female who presents today for follow up 2 weeks status post left long finger trigger release.  She is doing well overall, clicking and locking has resolved, no ongoing pain.  Pertinent ROS were reviewed with the patient and found to be negative unless otherwise specified above in HPI.   Assessment & Plan: Visit Diagnoses: No diagnosis found.  Plan: She has done well postoperatively.  Sutures removed today.  Can begin activities as tolerated with advancement to strengthening as tolerated.  No need for formalized therapy at this juncture.  Follow-up as needed.  Follow-up: No follow-ups on file.   Meds & Orders: No orders of the defined types were placed in this encounter.  No orders of the defined types were placed in this encounter.    Procedures: No procedures performed       Objective:   Vital Signs: LMP 10/02/2020 (Approximate)   Ortho Exam Left hand: - Well-healing incision at the base of the long finger, skin is well-approximated, no erythema or drainage - Able to perform full digital range of motion without residual clicking or locking - Sensation intact distally, hand is warm well-perfused   Imaging: No results found.   Mitsuye Schrodt Trevor Mace, M.D. [facility] 1:40 PM

## 2023-12-02 ENCOUNTER — Other Ambulatory Visit: Payer: Self-pay

## 2023-12-03 ENCOUNTER — Other Ambulatory Visit: Payer: Self-pay

## 2023-12-03 ENCOUNTER — Other Ambulatory Visit (HOSPITAL_COMMUNITY): Payer: Self-pay

## 2023-12-03 ENCOUNTER — Other Ambulatory Visit: Payer: Self-pay | Admitting: Internal Medicine

## 2023-12-03 DIAGNOSIS — F320 Major depressive disorder, single episode, mild: Secondary | ICD-10-CM

## 2023-12-03 MED ORDER — FLUOXETINE HCL 40 MG PO CAPS
40.0000 mg | ORAL_CAPSULE | Freq: Every day | ORAL | 0 refills | Status: DC
  Filled 2023-12-03 – 2023-12-25 (×2): qty 90, 90d supply, fill #0

## 2023-12-10 ENCOUNTER — Ambulatory Visit (INDEPENDENT_AMBULATORY_CARE_PROVIDER_SITE_OTHER): Payer: Medicaid Other | Admitting: Physician Assistant

## 2023-12-10 ENCOUNTER — Other Ambulatory Visit (HOSPITAL_COMMUNITY): Payer: Self-pay

## 2023-12-10 ENCOUNTER — Other Ambulatory Visit: Payer: Self-pay

## 2023-12-10 ENCOUNTER — Encounter: Payer: Self-pay | Admitting: Physician Assistant

## 2023-12-10 VITALS — BP 126/78 | HR 76 | Ht 66.0 in | Wt 241.0 lb

## 2023-12-10 DIAGNOSIS — K219 Gastro-esophageal reflux disease without esophagitis: Secondary | ICD-10-CM | POA: Diagnosis not present

## 2023-12-10 DIAGNOSIS — K8681 Exocrine pancreatic insufficiency: Secondary | ICD-10-CM

## 2023-12-10 DIAGNOSIS — Z7985 Long-term (current) use of injectable non-insulin antidiabetic drugs: Secondary | ICD-10-CM

## 2023-12-10 DIAGNOSIS — E1142 Type 2 diabetes mellitus with diabetic polyneuropathy: Secondary | ICD-10-CM

## 2023-12-10 DIAGNOSIS — K529 Noninfective gastroenteritis and colitis, unspecified: Secondary | ICD-10-CM | POA: Diagnosis not present

## 2023-12-10 DIAGNOSIS — Z6838 Body mass index (BMI) 38.0-38.9, adult: Secondary | ICD-10-CM

## 2023-12-10 DIAGNOSIS — Z9049 Acquired absence of other specified parts of digestive tract: Secondary | ICD-10-CM

## 2023-12-10 MED ORDER — FAMOTIDINE 40 MG PO TABS
40.0000 mg | ORAL_TABLET | Freq: Every day | ORAL | 0 refills | Status: DC
  Filled 2023-12-10: qty 90, 90d supply, fill #0

## 2023-12-10 MED ORDER — DICYCLOMINE HCL 20 MG PO TABS
20.0000 mg | ORAL_TABLET | Freq: Three times a day (TID) | ORAL | 0 refills | Status: DC | PRN
  Filled 2023-12-10: qty 50, 17d supply, fill #0

## 2023-12-10 MED ORDER — COLESTIPOL HCL 1 G PO TABS
2.0000 g | ORAL_TABLET | Freq: Two times a day (BID) | ORAL | 0 refills | Status: DC | PRN
  Filled 2023-12-10: qty 120, 30d supply, fill #0

## 2023-12-10 MED ORDER — PANCRELIPASE (LIP-PROT-AMYL) 36000-114000 UNITS PO CPEP
36000.0000 [IU] | ORAL_CAPSULE | Freq: Every day | ORAL | 2 refills | Status: AC
  Filled 2023-12-10: qty 300, 34d supply, fill #0
  Filled 2024-01-03 – 2024-01-05 (×2): qty 300, 34d supply, fill #1
  Filled 2024-01-28 – 2024-03-02 (×2): qty 300, 34d supply, fill #2

## 2023-12-10 NOTE — Patient Instructions (Addendum)
You have been scheduled for an appointment with Quentin Mulling PA-C on 02-26-2024 at 2pm . Please arrive 10 minutes early for your appointment.   Exocrine Pancreatic Insufficiency (EPI) EPI is a condition in which your body doesn't provide enough pancreatic enzymes to properly digest your food (which can sometimes lead to some unpleasant digestive symptoms such as bloating, AB pain and loose stools, weight loss).   For many people, EPI is also a chronic lifelong condition.   That's why its so important to know what to expect with your EPI treatment, because with the right plan in place, EPI is manageable.  HOW DO I TAKE PANCREATIC ENZYMES?  Pancreatic Enzyme Replacement therapy (Creon) is only available through prescription and cannot be substituted with over the counter alternatives.   Your doctor will personalize your dose based on your weight, diet, and symptoms.  The number of capsules you take per meal will depend on your prescribed dose.  Creon must be taken DURING every meal and snack.   Whether its a full meal or a snack, take Creon every time you eat. Remember to follow your treatment plan closely and take Creon exactly as prescribed - consistency is key!!  TAKE 1 PILL WITH SNACKS AND TWO PILLS WITH FOOD Dose adjustments are normal with Creon.   CAN TRY COLESTIPOL UP TO TWICE A DAY  STOP SODAS First do a trial off milk/lactose products if you use them.  Add fiber like benefiber or citracel once a day  Increase activity Can do trial of IBGard which is over the counter for AB pain- Take 1-2 capsules once a day for maintence or twice a day during a flare Can send in an anti spasm medication, Levsin, to take as needed    FODMAP stands for fermentable oligo-, di-, mono-saccharides and polyols (1). These are the scientific terms used to classify groups of carbs that are difficult for our body to digest and that are notorious for triggering digestive symptoms like bloating, gas,  loose stools and stomach pain.   You can try low FODMAP diet  - start with eliminating just one column at a time that you feel may be a trigger for you. - the table at the very bottom contains foods that are low in FODMAPs   Sometimes trying to eliminate the FODMAP's from your diet is difficult or tricky, if you are stuggling with trying to do the elimination diet you can try an enzyme.  There is a food enzymes that you sprinkle in or on your food that helps break down the FODMAP. You can read more about the enzyme by going to this site: https://fodzyme.com/   Your doctor will start your on the dose they deem appropriate for you.   Remember to follow up with your doctor after the first two weeks to ensure your therapy is effective and you're managing your treatment appropriately.  Please take your proton pump inhibitor medication, PEPCID AS NEEDED Please take this medication 30 minutes to 1 hour before meals- this makes it more effective.  Avoid spicy and acidic foods Avoid fatty foods Limit your intake of coffee, tea, alcohol, and carbonated drinks Work to maintain a healthy weight Keep the head of the bed elevated at least 3 inches with blocks or a wedge pillow if you are having any nighttime symptoms Stay upright for 2 hours after eating Avoid meals and snacks three to four hours before bedtime  Gastroparesis Please do small frequent meals like 4-6 meals a day.  Eat and drink liquids at separate times.  Avoid high fiber foods, cook your vegetables, avoid high fat food.  Suggest spreading protein throughout the day (greek yogurt, glucerna, soft meat, milk, eggs) Choose soft foods that you can mash with a fork When you are more symptomatic, change to pureed foods foods and liquids.  Consider reading "Living well with Gastroparesis" by Reuel Derby Gastroparesis is a condition in which food takes longer than normal to empty from the stomach. This condition is also known as  delayed gastric emptying. It is usually a long-term (chronic) condition. There is no cure, but there are treatments and things that you can do at home to help relieve symptoms. Treating the underlying condition that causes gastroparesis can also help relieve symptoms What are the causes? In many cases, the cause of this condition is not known. Possible causes include: A hormone (endocrine) disorder, such as hypothyroidism or diabetes. A nervous system disease, such as Parkinson's disease or multiple sclerosis. Cancer, infection, or surgery that affects the stomach or vagus nerve. The vagus nerve runs from your chest, through your neck, and to the lower part of your brain. A connective tissue disorder, such as scleroderma. Certain medicines. What increases the risk? You are more likely to develop this condition if: You have certain disorders or diseases. These may include: An endocrine disorder. An eating disorder. Amyloidosis. Scleroderma. Parkinson's disease. Multiple sclerosis. Cancer or infection of the stomach or the vagus nerve. You have had surgery on your stomach or vagus nerve. You take certain medicines. You are female. What are the signs or symptoms? Symptoms of this condition include: Feeling full after eating very little or a loss of appetite. Nausea, vomiting, or heartburn. Bloating of your abdomen. Inconsistent blood sugar (glucose) levels on blood tests. Unexplained weight loss. Acid from the stomach coming up into the esophagus (gastroesophageal reflux). Sudden tightening (spasm) of the stomach, which can be painful. Symptoms may come and go. Some people may not notice any symptoms. How is this diagnosed? This condition is diagnosed with tests, such as: Tests that check how long it takes food to move through the stomach and intestines. These tests include: Upper gastrointestinal (GI) series. For this test, you drink a liquid that shows up well on X-rays, and then  X-rays are taken of your intestines. Gastric emptying scintigraphy. For this test, you eat food that contains a small amount of radioactive material, and then scans are taken. Wireless capsule GI monitoring system. For this test, you swallow a pill (capsule) that records information about how foods and fluid move through your stomach. Gastric manometry. For this test, a tube is passed down your throat and into your stomach to measure electrical and muscular activity. Endoscopy. For this test, a long, thin tube with a camera and light on the end is passed down your throat and into your stomach to check for problems in your stomach lining. Ultrasound. This test uses sound waves to create images of the inside of your body. This can help rule out gallbladder disease or pancreatitis as a cause of your symptoms. How is this treated? There is no cure for this condition, but treatment and home care may relieve symptoms. Treatment may include: Treating the underlying cause. Managing your symptoms by making changes to your diet and exercise habits. Taking medicines to control nausea and vomiting and to stimulate stomach muscles. Getting food through a feeding tube in the hospital. This may be done in severe cases. Having surgery to insert a  device called a gastric electrical stimulator into your body. This device helps improve stomach emptying and control nausea and vomiting. Follow these instructions at home: Take over-the-counter and prescription medicines only as told by your health care provider. Follow instructions from your health care provider about eating or drinking restrictions. Your health care provider may recommend that you: Eat smaller meals more often. Eat low-fat foods. Eat low-fiber forms of high-fiber foods. For example, eat cooked vegetables instead of raw vegetables. Have only liquid foods instead of solid foods. Liquid foods are easier to digest. Drink enough fluid to keep your urine  pale yellow. Exercise as often as told by your health care provider. Keep all follow-up visits. This is important. Contact a health care provider if you: Notice that your symptoms do not improve with treatment. Have new symptoms. Get help right away if you: Have severe pain in your abdomen that does not improve with treatment. Have nausea that is severe or does not go away. Vomit every time you drink fluids. Summary Gastroparesis is a long-term (chronic) condition in which food takes longer than normal to empty from the stomach. Symptoms include nausea, vomiting, heartburn, bloating of your abdomen, and loss of appetite. Eating smaller portions, low-fat foods, and low-fiber forms of high-fiber foods may help you manage your symptoms. Get help right away if you have severe pain in your abdomen. This information is not intended to replace advice given to you by your health care provider. Make sure you discuss any questions you have with your health care provider. Document Revised: 04/24/2020 Document Reviewed: 04/24/2020 Elsevier Patient Education  2021 [employer].

## 2023-12-10 NOTE — Progress Notes (Signed)
12/10/2023 Amy Norris [id number] [birth year]  Referring provider: Rudene Christians, DO Primary GI doctor: Dr. 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 -Add fiber supplement (Benefiber or Citrucel) once daily. -Stop sodas and trial off milk. -Trial of colestipol. -Consider trial of amitriptyline if symptoms persist. -If symptoms persist or worsen, consider CT scan.  Type 2 diabetes with neuropathy On ozempic x 3 months  Morbid obesity  Body mass index is 38.9 kg/m.  -Patient has been advised to make an attempt to improve diet and exercise patterns to aid in weight loss. -Recommended diet heavy in fruits and veggies and low in animal meats, cheeses, and dairy products, appropriate calorie intake  GERD no dysphagia Possible gastroparesis/GERD Lifestyle changes discussed, avoid NSAIDS, ETOH Weight loss discussed with the patient Discussed diet with small, frequent meals, soft diet with the patient.    Patient Care Team: Masters, Florentina Addison, DO as PCP - General  HISTORY OF PRESENT ILLNESS: 57 y.o. female with a past medical history of hypertension, OSA, GERD, EPI, type 2 diabetes with neuropathy, morbid obesity and others listed below presents for evaluation of diarrhea.   02/06/2019 CT abdomen pelvis with contrast for fall showed normal liver status post cholecystectomy no biliary ductal dilation normal pancreas normal spleen no bowel inflammation, diverticulosis without diverticulitis. 02/29/2020 colonoscopy with Dr. Marina Goodell for diarrhea and screening purposes entire colon was  unremarkable recall colonoscopy 10 years.  Random biopsies negative for lymphocytic colitis. 08/25/2023 labs reviewed show normal sed rate, CRP, negative fecal calprotectin normal thyroid.  CBC without anemia. Pancreatic elastase 62. A1c 6.5  Discussed the use of AI scribe software for clinical note transcription with the patient, who gave verbal consent to proceed.  History of Present Illness   The patient, with a history of diabetes and cholecystectomy, presents with chronic diarrhea that has been ongoing for several years but has worsened over the past month. The patient describes the stool as mostly watery with very little form. The urgency to defecate is so severe that it often disrupts daily activities and has led to instances of incontinence. The patient reports that the urgency and diarrhea occur not only after meals but also after consuming liquids and even upon physical movement.  Previously, the patient was able to manage the symptoms with over-the-counter Imodium, taking two capsules daily. However, in the past month, this regimen has become ineffective. The patient was recently prescribed Creon, which she takes once daily before her single meal of the day. The patient reports that the Creon may provide some relief, but it is inconsistent.  The patient also reports experiencing abdominal cramping, which she likens to menstrual cramps, and increased gas. She denies any nausea, vomiting, or presence of blood in the stool. The patient has been taking Pepto Bismol for occasional heartburn.  The patient's diet includes regular consumption of milk products and soda, both diet and regular. She reports drinking soda throughout the day. The patient has not noticed any correlation between these dietary habits and the severity of her symptoms.  The patient's medical history includes diabetes, for which she has been taking Ozempic since July. She also had her gallbladder removed  in the past. The  patient had a colonoscopy in 2021, which was negative for microscopic colitis. The patient denies any recent antibiotic use or exposure to sick contacts.  In summary, the patient presents with chronic, worsening diarrhea and abdominal cramping, which significantly impact her daily life. The symptoms are not well-controlled with current medications. The patient's history of diabetes and cholecystectomy, along with her dietary habits, may be contributing factors to her condition.      She  reports that she has been smoking cigarettes. She has never used smokeless tobacco. She reports that she does not currently use alcohol. She reports that she does not use drugs.  RELEVANT LABS AND IMAGING:  Results   LABS Pancreatic elastase: 62  DIAGNOSTIC Colonoscopy: Negative for microscopic colitis (2021)      CBC    Component Value Date/Time   WBC 7.0 08/25/2023 1517   WBC 8.7 09/05/2022 1556   RBC 5.37 (H) 08/25/2023 1517   RBC 4.68 09/05/2022 1556   HGB 15.7 08/25/2023 1517   HCT 48.4 (H) 08/25/2023 1517   PLT 317 08/25/2023 1517   MCV 90 08/25/2023 1517   MCH 29.2 08/25/2023 1517   MCH 29.7 09/05/2022 1556   MCHC 32.4 08/25/2023 1517   MCHC 33.1 09/05/2022 1556   RDW 12.8 08/25/2023 1517   LYMPHSABS 2.1 08/25/2023 1517   MONOABS 0.6 09/05/2022 1556   EOSABS 0.1 08/25/2023 1517   BASOSABS 0.0 08/25/2023 1517   Recent Labs    08/25/23 1517  HGB 15.7    CMP     Component Value Date/Time   NA 146 (H) 04/08/2023 1629   K 3.9 04/08/2023 1629   CL 105 04/08/2023 1629   CO2 26 04/08/2023 1629   GLUCOSE 73 04/08/2023 1629   GLUCOSE 88 09/05/2022 1824   BUN 9 04/08/2023 1629   CREATININE 0.63 04/08/2023 1629   CREATININE 0.59 07/11/2014 1510   CALCIUM 9.6 04/08/2023 1629   PROT 6.3 (L) 09/05/2022 1556   PROT 6.7 01/25/2020 0950   ALBUMIN 3.4 (L) 09/05/2022 1556   ALBUMIN 4.2 01/25/2020 0950   AST 31 09/05/2022 1556   ALT 15 09/05/2022 1556   ALKPHOS 52 09/05/2022 1556    BILITOT 0.3 09/05/2022 1556   BILITOT 0.4 01/25/2020 0950   GFRNONAA >60 09/05/2022 1556   GFRNONAA >89 07/11/2014 1510   GFRAA >60 06/01/2020 1445   GFRAA >89 07/11/2014 1510      Latest Ref Rng & Units 09/05/2022    3:56 PM 01/25/2020    9:50 AM 09/14/2012   11:10 AM  Hepatic Function  Total Protein 6.5 - 8.1 g/dL 6.3  6.7  6.3   Albumin 3.5 - 5.0 g/dL 3.4  4.2  4.0   AST 15 - 41 U/L 31  28  32   ALT 0 - 44 U/L 15  10  14    Alk Phosphatase 38 - 126 U/L 52  63  49   Total Bilirubin 0.3 - 1.2 mg/dL 0.3  0.4  0.6       Current Medications:   Current Outpatient Medications (Endocrine & Metabolic):    dapagliflozin propanediol (FARXIGA) 10 MG TABS tablet, Take 1 tablet (10 mg total) by mouth daily.   Insulin Glargine Solostar (LANTUS) 100 UNIT/ML Solostar Pen, Inject 30 Units into the skin 2 (two) times daily.   Semaglutide, 2 MG/DOSE, (OZEMPIC, 2 MG/DOSE,) 8 MG/3ML SOPN, Inject 2 mg into the skin once a week.  Current Outpatient Medications (Cardiovascular):  atorvastatin (LIPITOR) 40 MG tablet, Take 1 tablet (40 mg total) by mouth daily.   colestipol (COLESTID) 1 g tablet, Take 2 tablets (2 g total) by mouth 2 (two) times daily as needed (DIARRHEA).   lisinopril (ZESTRIL) 10 MG tablet, Take 1 tablet (10 mg total) by mouth daily.  Current Outpatient Medications (Respiratory):    albuterol (VENTOLIN HFA) 108 (90 Base) MCG/ACT inhaler, Inhale 2 puffs into the lungs every 6 (six) hours as needed for wheezing or shortness of breath. (Patient not taking: Reported on 12/10/2023)    Current Outpatient Medications (Other):    Continuous Glucose Receiver (DEXCOM G7 RECEIVER) DEVI, Please use this if you are unable to use the SmartPhone app.   Continuous Glucose Sensor (DEXCOM G7 SENSOR) MISC, Please attach to your arm. You can wear these for 10 days at a time.   diclofenac Sodium (VOLTAREN) 1 % GEL, Apply 4 g topically 4 (four) times daily.   dicyclomine (BENTYL) 20 MG tablet, Take 1  tablet (20 mg total) by mouth 3 (three) times daily as needed for spasms.   famotidine (PEPCID) 40 MG tablet, Take 1 tablet (40 mg total) by mouth at bedtime.   FLUoxetine (PROZAC) 40 MG capsule, Take 1 capsule (40 mg total) by mouth daily.   gabapentin (NEURONTIN) 300 MG capsule, Take 1 capsule (300 mg total) by mouth 3 (three) times daily.   glucose blood (CONTOUR NEXT TEST) test strip, Use to test blood glucose up to 4 times per day.   lipase/protease/amylase (CREON) 36000 UNITS CPEP capsule, Take 2 capsules before large meals, and 1 capsule with snacks   Accu-Chek Softclix Lancets lancets, Test 4 (four) times daily. (Patient not taking: Reported on 12/10/2023)   Insulin Pen Needle 32G X 6 MM MISC, Please use to inject insulin five times daily. (Patient not taking: Reported on 12/10/2023)   Microlet Lancets MISC, Use to check blood glucose up to 4 times per day. (Patient not taking: Reported on 12/10/2023)  Medical History:  Past Medical History:  Diagnosis Date   Anal fissure    Breast mass    Bronchitis    Carpal tunnel syndrome, bilateral    Cholelithiasis    Depression    Diabetes mellitus    Type II   Essential hypertension    GERD (gastroesophageal reflux disease)    Herpes simplex type 1 infection    vaginal   Hypokalemia    2/2 diarrhea   Menorrhagia    Obesity    Polycystic ovarian disease    Postural dizziness with presyncope 11/29/2013   Renal calculi    Renal calculus    Sleep apnea    no cpap   Sleep apnea, obstructive    Allergies:  Allergies  Allergen Reactions   Vicodin [Hydrocodone-Acetaminophen] Itching    Patient states that she can take this medication. Must take with benadryl.   Metformin And Related Nausea And Vomiting    Patient states that she can take this medication     Surgical History:  She  has a past surgical history that includes Tubal ligation (12/30/1989); Cholecystectomy (10/31/2007); and Hand surgery (11/25/2023). Family History:   Her family history includes Breast cancer in her maternal aunt and mother; Diabetes in her father; Hypertension in her mother.  REVIEW OF SYSTEMS  : All other systems reviewed and negative except where noted in the History of Present Illness.  PHYSICAL EXAM: BP 126/78   Pulse 76   Ht 5\' 6"  (1.676 m)   Wt  241 lb (109.3 kg)   LMP 10/02/2020 (Approximate)   BMI 38.90 kg/m  General Appearance: obese, no apparent distress. Head:   Normocephalic and atraumatic. Poor dentition Eyes:  sclerae anicteric,conjunctive pink  Respiratory: Respiratory effort normal, BS equal bilaterally without rales, rhonchi, wheezing. Cardio: RRR with no MRGs. Peripheral pulses intact.  Abdomen: Soft,  Obese ,active bowel sounds. No tenderness . Without guarding and Without rebound. No masses. Rectal: declines Musculoskeletal: Full ROM, Normal gait. Without edema. Skin:  Dry and intact without significant lesions or rashes Neuro: Alert and  oriented x4;  No focal deficits. Psych:  Cooperative. Normal mood and affect.    Doree Albee, PA-C 2:40 PM

## 2023-12-10 NOTE — Progress Notes (Signed)
PA assessment and plan noted

## 2023-12-17 ENCOUNTER — Other Ambulatory Visit (HOSPITAL_COMMUNITY): Payer: Self-pay

## 2023-12-17 ENCOUNTER — Ambulatory Visit: Payer: Medicaid Other | Admitting: Student

## 2023-12-17 VITALS — BP 120/49 | HR 89 | Temp 98.2°F | Ht 66.0 in | Wt 243.4 lb

## 2023-12-17 DIAGNOSIS — E1142 Type 2 diabetes mellitus with diabetic polyneuropathy: Secondary | ICD-10-CM | POA: Diagnosis not present

## 2023-12-17 DIAGNOSIS — K529 Noninfective gastroenteritis and colitis, unspecified: Secondary | ICD-10-CM

## 2023-12-17 DIAGNOSIS — Z7985 Long-term (current) use of injectable non-insulin antidiabetic drugs: Secondary | ICD-10-CM | POA: Diagnosis not present

## 2023-12-17 NOTE — Progress Notes (Signed)
Subjective:  CC: Chronic diarrhea  HPI:  Amy Norris is a 56 y.o. person with a past medical history stated below and presents today for the stated chief complaint. Please see problem based assessment and plan for additional details.  Past Medical History:  Diagnosis Date   Anal fissure    Breast mass    Bronchitis    Carpal tunnel syndrome, bilateral    Cholelithiasis    Depression    Diabetes mellitus    Type II   Essential hypertension    GERD (gastroesophageal reflux disease)    Herpes simplex type 1 infection    vaginal   Hypokalemia    2/2 diarrhea   Menorrhagia    Obesity    Polycystic ovarian disease    Postural dizziness with presyncope 11/29/2013   Renal calculi    Renal calculus    Sleep apnea    no cpap   Sleep apnea, obstructive     Current Outpatient Medications on File Prior to Visit  Medication Sig Dispense Refill   Accu-Chek Softclix Lancets lancets Test 4 (four) times daily. (Patient not taking: Reported on 12/10/2023) 100 each 12   albuterol (VENTOLIN HFA) 108 (90 Base) MCG/ACT inhaler Inhale 2 puffs into the lungs every 6 (six) hours as needed for wheezing or shortness of breath. (Patient not taking: Reported on 12/10/2023) 6.7 g 2   atorvastatin (LIPITOR) 40 MG tablet Take 1 tablet (40 mg total) by mouth daily. 90 tablet 2   colestipol (COLESTID) 1 g tablet Take 2 tablets (2 g total) by mouth 2 (two) times daily as needed (DIARRHEA). 120 tablet 0   Continuous Glucose Receiver (DEXCOM G7 RECEIVER) DEVI Please use this if you are unable to use the SmartPhone app. 1 each 0   Continuous Glucose Sensor (DEXCOM G7 SENSOR) MISC Please attach to your arm. You can wear these for 10 days at a time. 9 each 3   dapagliflozin propanediol (FARXIGA) 10 MG TABS tablet Take 1 tablet (10 mg total) by mouth daily. 30 tablet 11   diclofenac Sodium (VOLTAREN) 1 % GEL Apply 4 g topically 4 (four) times daily. 100 g 1   dicyclomine (BENTYL) 20 MG tablet Take 1  tablet (20 mg total) by mouth 3 (three) times daily as needed for spasms. 50 tablet 0   famotidine (PEPCID) 40 MG tablet Take 1 tablet (40 mg total) by mouth at bedtime. 90 tablet 0   FLUoxetine (PROZAC) 40 MG capsule Take 1 capsule (40 mg total) by mouth daily. 90 capsule 0   gabapentin (NEURONTIN) 300 MG capsule Take 1 capsule (300 mg total) by mouth 3 (three) times daily. 90 capsule 3   glucose blood (CONTOUR NEXT TEST) test strip Use to test blood glucose up to 4 times per day. 200 strip 12   Insulin Glargine Solostar (LANTUS) 100 UNIT/ML Solostar Pen Inject 30 Units into the skin 2 (two) times daily. 30 mL 3   Insulin Pen Needle 32G X 6 MM MISC Please use to inject insulin five times daily. (Patient not taking: Reported on 12/10/2023) 500 each 11   lipase/protease/amylase (CREON) 36000 UNITS CPEP capsule Take 2 capsules before large meals, and 1 capsule with snacks 300 capsule 2   lisinopril (ZESTRIL) 10 MG tablet Take 1 tablet (10 mg total) by mouth daily. 90 tablet 3   Microlet Lancets MISC Use to check blood glucose up to 4 times per day. (Patient not taking: Reported on 12/10/2023) 200 each 12  Semaglutide, 2 MG/DOSE, (OZEMPIC, 2 MG/DOSE,) 8 MG/3ML SOPN Inject 2 mg into the skin once a week. 3 mL 3   No current facility-administered medications on file prior to visit.    Review of Systems: Please see assessment and plan for pertinent positives and negatives.  Objective:   Vitals:   12/17/23 1351  BP: (!) 120/49  Pulse: 89  Temp: 98.2 F (36.8 C)  TempSrc: Oral  SpO2: 98%  Weight: 243 lb 6.4 oz (110.4 kg)  Height: 5\' 6"  (1.676 m)    Physical Exam: Constitutional: Well-appearing Cardiovascular: Regular rate and rhythm Pulmonary/Chest: lungs clear to auscultation bilaterally Abdominal: soft, non-tender, non-distended Extremities: No edema of the lower extremities bilaterally Psych: Pleasant affect Thought process is linear and is goal-directed.     Assessment & Plan:   Chronic diarrhea Patient recently seen by GI for her ongoing chronic diarrhea.  Recommended increasing Creon, adding fiber supplement, trial of stopping sodas, stopping milk, dietary modification.  Also recommended trial of colestipol.  The patient has been unable to start fiber supplements and colestipol.  She noted some improvement when increasing the Creon dosing.  I reviewed the patient's anatomy and pathophysiology of her diarrhea with her today.  Extensive discussion regarding dietary modification and lifestyle modification.  All questions answered, and patient amenable to the plan.  Type 2 diabetes mellitus with peripheral neuropathy (HCC) Last A1c 6.5, currently on Ozempic 2 mg.  Doing well.  She is due for her diabetic eye exam, I will place a referral for that today.  Otherwise she states she is doing well with her blood glucose control, we will plan for follow-up in 1 month for dedicated type 2 diabetes follow-up.  At this time she will be 3 months out of the last A1c, and can repeat A1c.   Patient seen with Dr. Willow Ora MD [facility]  PGY-1 Pager: [phone]  Phone: [phone] Date 12/17/2023  Time 3:23 PM

## 2023-12-17 NOTE — Assessment & Plan Note (Addendum)
Last A1c 6.5, currently on Ozempic 2 mg.  Doing well.  She is due for her diabetic eye exam, I will place a referral for that today.  Otherwise she states she is doing well with her blood glucose control, we will plan for follow-up in 1 month for dedicated type 2 diabetes follow-up.  At this time she will be 3 months out of the last A1c, and can repeat A1c. Plan: Type 2 diabetes follow-up in 1 month for A1c visit. Referral for diabetic eye exam placed.

## 2023-12-17 NOTE — Patient Instructions (Addendum)
Thank you, Ms.Charnice P Badie for allowing [place] to provide your care today.  I have ordered the following tests for you:  Lab Orders  No laboratory test(s) ordered today      Referrals ordered today:   Referral Orders         Ambulatory referral to Ophthalmology       I have ordered the following medication/changed the following medications:   Stop the following medications: There are no discontinued medications.   Start the following medications: No orders of the defined types were placed in this encounter.      Follow up:  4 weeks  for diabetes follow up.  Please remember: Please pick up your medications from the [facility] pharmacy and work on modification of your diet. Please try being strict with your diet to see which foods upset your stomach the worst.   Per GI doctor:  -Increase Creon to 2 capsules with meals, refilled for new prescription. -Add fiber supplement (Benefiber or Citrucel) once daily. -Stop sodas and trial off milk. -Trial of colestipol. -Recommended diet heavy in fruits and veggies and low in animal meats, cheeses, and dairy products, appropriate calorie intake    We look forward to seeing you next time. Please call our clinic at [phone] if you have any questions or concerns. The best time to call is Monday-Friday from 9am-4pm, but there is someone available 24/7. If after hours or the weekend, call the main hospital number and ask for the Internal Medicine Resident On-Call. If you need medication refills, please notify your pharmacy one week in advance and they will send [place] a request.   Thank you for trusting me with your care. Wishing you the best!  Lovie Macadamia MD [facility]

## 2023-12-17 NOTE — Assessment & Plan Note (Addendum)
Patient recently seen by GI for her ongoing chronic diarrhea.  Recommended increasing Creon, adding fiber supplement, trial of stopping sodas, stopping milk, dietary modification.  Also recommended trial of colestipol.  The patient has been unable to start fiber supplements and colestipol.  She noted some improvement when increasing the Creon dosing.  Patient will go to pharmacy today to pick up these medications.  I reviewed the patient's anatomy and pathophysiology of her diarrhea with her today.  Extensive discussion regarding dietary modification and lifestyle modification.  All questions answered, and patient amenable to the plan.  Plan: Pick up medications, continue management plan per GI recommendations Dietary modifications discussed, importance of avoiding fatty foods.

## 2023-12-18 ENCOUNTER — Other Ambulatory Visit (HOSPITAL_COMMUNITY): Payer: Self-pay

## 2023-12-25 ENCOUNTER — Other Ambulatory Visit (HOSPITAL_COMMUNITY): Payer: Self-pay

## 2023-12-29 ENCOUNTER — Other Ambulatory Visit: Payer: Self-pay | Admitting: Physician Assistant

## 2023-12-29 ENCOUNTER — Other Ambulatory Visit (HOSPITAL_COMMUNITY): Payer: Self-pay

## 2023-12-29 DIAGNOSIS — K8681 Exocrine pancreatic insufficiency: Secondary | ICD-10-CM

## 2023-12-29 DIAGNOSIS — K529 Noninfective gastroenteritis and colitis, unspecified: Secondary | ICD-10-CM

## 2023-12-29 MED ORDER — DICYCLOMINE HCL 20 MG PO TABS
20.0000 mg | ORAL_TABLET | Freq: Three times a day (TID) | ORAL | 0 refills | Status: DC | PRN
  Filled 2024-01-03 – 2024-01-05 (×2): qty 50, 17d supply, fill #0

## 2023-12-29 NOTE — Progress Notes (Signed)
Internal Medicine Clinic Attending  I was physically present during the key portions of the resident provided service and participated in the medical decision making of patient's management care. I reviewed pertinent patient test results.  The assessment, diagnosis, and plan were formulated together and I agree with the documentation in the resident's note.  Williams, Julie Anne, MD  

## 2024-01-01 ENCOUNTER — Other Ambulatory Visit (HOSPITAL_COMMUNITY): Payer: Self-pay

## 2024-01-02 ENCOUNTER — Ambulatory Visit: Payer: Self-pay

## 2024-01-02 ENCOUNTER — Emergency Department (HOSPITAL_COMMUNITY): Payer: Medicaid Other

## 2024-01-02 ENCOUNTER — Emergency Department (HOSPITAL_COMMUNITY)
Admission: EM | Admit: 2024-01-02 | Discharge: 2024-01-03 | Discharge status: Against Medical Advice | Payer: Medicaid Other | Attending: Emergency Medicine | Admitting: Emergency Medicine

## 2024-01-02 ENCOUNTER — Encounter (HOSPITAL_COMMUNITY): Payer: Self-pay | Admitting: *Deleted

## 2024-01-02 ENCOUNTER — Other Ambulatory Visit: Payer: Self-pay

## 2024-01-02 DIAGNOSIS — R059 Cough, unspecified: Secondary | ICD-10-CM | POA: Diagnosis not present

## 2024-01-02 DIAGNOSIS — R1110 Vomiting, unspecified: Secondary | ICD-10-CM | POA: Insufficient documentation

## 2024-01-02 DIAGNOSIS — Z5321 Procedure and treatment not carried out due to patient leaving prior to being seen by health care provider: Secondary | ICD-10-CM | POA: Diagnosis not present

## 2024-01-02 DIAGNOSIS — R079 Chest pain, unspecified: Secondary | ICD-10-CM | POA: Diagnosis present

## 2024-01-02 DIAGNOSIS — R519 Headache, unspecified: Secondary | ICD-10-CM | POA: Diagnosis not present

## 2024-01-02 DIAGNOSIS — R0789 Other chest pain: Secondary | ICD-10-CM | POA: Diagnosis not present

## 2024-01-02 LAB — COMPREHENSIVE METABOLIC PANEL
ALT: 15 U/L (ref 0–44)
AST: 37 U/L (ref 15–41)
Albumin: 3.8 g/dL (ref 3.5–5.0)
Alkaline Phosphatase: 66 U/L (ref 38–126)
Anion gap: 12 (ref 5–15)
BUN: 7 mg/dL (ref 6–20)
CO2: 24 mmol/L (ref 22–32)
Calcium: 9.8 mg/dL (ref 8.9–10.3)
Chloride: 105 mmol/L (ref 98–111)
Creatinine, Ser: 0.89 mg/dL (ref 0.44–1.00)
GFR, Estimated: >60 mL/min (ref >60)
Glucose, Bld: 216 mg/dL — ABNORMAL HIGH (ref 70–99)
Potassium: 3.5 mmol/L (ref 3.5–5.1)
Sodium: 141 mmol/L (ref 135–145)
Total Bilirubin: 0.4 mg/dL (ref 0.0–1.2)
Total Protein: 7.4 g/dL (ref 6.5–8.1)

## 2024-01-02 LAB — CBC
HCT: 44.9 % (ref 36.0–46.0)
Hemoglobin: 14.9 g/dL (ref 12.0–15.0)
MCH: 28.8 pg (ref 26.0–34.0)
MCHC: 33.2 g/dL (ref 30.0–36.0)
MCV: 86.8 fL (ref 80.0–100.0)
Platelets: 320 10*3/uL (ref 150–400)
RBC: 5.17 MIL/uL — ABNORMAL HIGH (ref 3.87–5.11)
RDW: 13.2 % (ref 11.5–15.5)
WBC: 9.5 10*3/uL (ref 4.0–10.5)
nRBC: 0 % (ref 0.0–0.2)

## 2024-01-02 LAB — URINALYSIS, ROUTINE W REFLEX MICROSCOPIC
Bacteria, UA: NONE SEEN
Bilirubin Urine: NEGATIVE
Glucose, UA: >=500 mg/dL — AB
Hgb urine dipstick: NEGATIVE
Ketones, ur: NEGATIVE mg/dL
Leukocytes,Ua: NEGATIVE
Nitrite: NEGATIVE
Protein, ur: NEGATIVE mg/dL
Specific Gravity, Urine: 1.033 — ABNORMAL HIGH (ref 1.005–1.030)
pH: 5 (ref 5.0–8.0)

## 2024-01-02 LAB — LIPASE, BLOOD: Lipase: 60 U/L — ABNORMAL HIGH (ref 11–51)

## 2024-01-02 NOTE — ED Triage Notes (Signed)
 The pt rep[orts that she thinks she is bleeding internally  she ios c/o mid-chest pain and some pain in the back of her head coughing productive cough yellow in color  vomiting also

## 2024-01-02 NOTE — Telephone Encounter (Signed)
 Chief Complaint: Cough Symptoms: CP w/coughing, wheezing, yellow/white phlegm when coughing, SOB w/walking and when lying flat, black stools Frequency: Onset 3 days ago Pertinent Negatives: Patient denies fever Disposition: [x] ED /[] Urgent Care (no appt availability in office) / [] Appointment(In office/virtual)/ []  [facility]/ [] Home Care/ [] Refused Recommended Disposition /[] [facility]/ []  Follow-up with PCP Additional Notes: Advised due to SOB, CP to go to the ED, she verbalized understanding.    Summary: cough / rx req   The patient has called on the community line  The patient has experienced a cough for roughly 3 days  The patient was previously diagnosed with bronchitis and shares that they have a continued history of it  The patient would like to be prescribed medication for their their discomfort  The patient would like to speak with a member of clinical staff when possible  Please contact further when available     Reason for Disposition  Chest pain  (Exception: MILD central chest pain, present only when coughing.)  Answer Assessment - Initial Assessment Questions 1. ONSET: When did the cough begin?      3 days ago 2. SEVERITY: How bad is the cough today?      8 3. SPUTUM: Describe the color of your sputum (none, dry cough; clear, white, yellow, green)     Yellow and white 4. HEMOPTYSIS: Are you coughing up any blood? If so ask: How much? (flecks, streaks, tablespoons, etc.)     No 5. DIFFICULTY BREATHING: Are you having difficulty breathing? If Yes, ask: How bad is it? (e.g., mild, moderate, severe)    - MILD: No SOB at rest, mild SOB with walking, speaks normally in sentences, can lie down, no retractions, pulse < 100.    - MODERATE: SOB at rest, SOB with minimal exertion and prefers to sit, cannot lie down flat, speaks in phrases, mild retractions, audible wheezing, pulse 100-120.    - SEVERE: Very SOB at rest, speaks in single  words, struggling to breathe, sitting hunched forward, retractions, pulse > 120      Mild-moderate, laying flat hard to breathe 6. FEVER: Do you have a fever? If Yes, ask: What is your temperature, how was it measured, and when did it start?     No 7. CARDIAC HISTORY: Do you have any history of heart disease? (e.g., heart attack, congestive heart failure)      No 8. LUNG HISTORY: Do you have any history of lung disease?  (e.g., pulmonary embolus, asthma, emphysema)     No 9. PE RISK FACTORS: Do you have a history of blood clots? (or: recent major surgery, recent prolonged travel, bedridden)     No 10. OTHER SYMPTOMS: Do you have any other symptoms? (e.g., runny nose, wheezing, chest pain)       Wheezing, chest pain w/coughing, sinus congestion, black stools couple of days  Protocols used: Cough - Acute Productive-A-AH

## 2024-01-03 ENCOUNTER — Other Ambulatory Visit: Payer: Self-pay | Admitting: Student

## 2024-01-03 ENCOUNTER — Other Ambulatory Visit: Payer: Self-pay

## 2024-01-03 ENCOUNTER — Other Ambulatory Visit: Payer: Self-pay | Admitting: Physician Assistant

## 2024-01-03 DIAGNOSIS — E1142 Type 2 diabetes mellitus with diabetic polyneuropathy: Secondary | ICD-10-CM

## 2024-01-03 NOTE — ED Notes (Signed)
 Pt left without being seen.

## 2024-01-05 ENCOUNTER — Encounter (HOSPITAL_COMMUNITY): Payer: Self-pay

## 2024-01-05 ENCOUNTER — Other Ambulatory Visit: Payer: Self-pay | Admitting: Internal Medicine

## 2024-01-05 ENCOUNTER — Other Ambulatory Visit: Payer: Self-pay

## 2024-01-05 ENCOUNTER — Ambulatory Visit: Payer: Medicaid Other | Admitting: Internal Medicine

## 2024-01-05 ENCOUNTER — Other Ambulatory Visit (HOSPITAL_COMMUNITY): Payer: Self-pay

## 2024-01-05 VITALS — BP 132/89 | HR 79 | Temp 98.3°F | Ht 66.0 in | Wt 237.6 lb

## 2024-01-05 DIAGNOSIS — J209 Acute bronchitis, unspecified: Secondary | ICD-10-CM | POA: Diagnosis present

## 2024-01-05 MED ORDER — FLUTICASONE PROPIONATE 50 MCG/ACT NA SUSP
1.0000 | Freq: Every day | NASAL | 2 refills | Status: DC
  Filled 2024-01-05: qty 16, 30d supply, fill #0
  Filled 2024-01-28: qty 16, 30d supply, fill #1
  Filled 2024-02-27: qty 16, 30d supply, fill #2

## 2024-01-05 MED ORDER — DEXCOM G7 RECEIVER DEVI
0 refills | Status: DC
  Filled 2024-01-05: qty 1, 30d supply, fill #0
  Filled 2024-03-02: qty 1, 90d supply, fill #0

## 2024-01-05 MED ORDER — VENTOLIN HFA 108 (90 BASE) MCG/ACT IN AERS
1.0000 | INHALATION_SPRAY | Freq: Four times a day (QID) | RESPIRATORY_TRACT | 2 refills | Status: AC | PRN
  Filled 2024-01-05: qty 18, 25d supply, fill #0
  Filled 2024-01-28: qty 18, 25d supply, fill #1
  Filled 2025-01-03: qty 6.7, 25d supply, fill #2

## 2024-01-05 MED ORDER — BENZONATATE 100 MG PO CAPS
200.0000 mg | ORAL_CAPSULE | Freq: Three times a day (TID) | ORAL | 1 refills | Status: DC | PRN
  Filled 2024-01-05: qty 30, 5d supply, fill #0
  Filled 2024-01-28: qty 30, 5d supply, fill #1

## 2024-01-05 MED ORDER — FAMOTIDINE 40 MG PO TABS
40.0000 mg | ORAL_TABLET | Freq: Every day | ORAL | 0 refills | Status: DC
  Filled 2024-01-05 – 2024-04-02 (×4): qty 90, 90d supply, fill #0

## 2024-01-05 NOTE — Patient Instructions (Addendum)
 It was a pleasure to care for you today!  I suspect you have viral bronchitis.  I will refill your albuterol  inhaler which you can use every 6 hours as needed for cough.   I have also sent in tessalon  perles that you can take 3 times daily as needed for cough.  I have sent in flonase  that you can use daily for nasal congestion. You can also consider nasal irrigation (such as Neti Pot).  Continue taking mucinex  if you feel like the mucus in your cough is getting thicker.\  You can also try drinking warm beverages/teas with honey to soothe your throat, as well as cough drops.  If your symptoms get worse or you develop a measured fever of 100.4 or higher, please contact our clinic.  Feel better soon, Dr. Addie

## 2024-01-05 NOTE — Telephone Encounter (Signed)
 Medication sent to pharmacy

## 2024-01-05 NOTE — Progress Notes (Signed)
 CC: cough  HPI:  [name] is a 58 y.o. female with past medical history as detailed below who presents with cough. Please see problem based charting for detailed assessment and plan.  Past Medical History:  Diagnosis Date   Anal fissure    Breast mass    Bronchitis    Carpal tunnel syndrome, bilateral    Cholelithiasis    Depression    Diabetes mellitus    Type II   Essential hypertension    GERD (gastroesophageal reflux disease)    Herpes simplex type 1 infection    vaginal   Hypokalemia    2/2 diarrhea   Menorrhagia    Obesity    Polycystic ovarian disease    Postural dizziness with presyncope 11/29/2013   Renal calculi    Renal calculus    Sleep apnea    no cpap   Sleep apnea, obstructive    Review of Systems:  Negative unless otherwise stated.  Physical Exam:  Vitals:   01/05/24 0951  BP: 132/89  Pulse: 79  Temp: 98.3 F (36.8 C)  TempSrc: Oral  SpO2: 99%  Weight: 237 lb 9.6 oz (107.8 kg)  Height: 5' 6 (1.676 m)   Physical Exam Constitutional:      General: She is not in acute distress.    Appearance: Normal appearance. She is not toxic-appearing.  HENT:     Right Ear: Tympanic membrane, ear canal and external ear normal. There is impacted cerumen.     Left Ear: Tympanic membrane, ear canal and external ear normal. There is impacted cerumen.     Mouth/Throat:     Mouth: Mucous membranes are moist.     Pharynx: Oropharynx is clear. No oropharyngeal exudate or posterior oropharyngeal erythema.  Cardiovascular:     Rate and Rhythm: Normal rate and regular rhythm.  Pulmonary:     Effort: Pulmonary effort is normal. No respiratory distress.     Breath sounds: Normal breath sounds. No wheezing.     Comments: Intermittent dry cough. Hoarse voice. Skin:    General: Skin is warm and dry.  Neurological:     General: No focal deficit present.     Mental Status: She is alert and oriented to person, place, and time.    Assessment & Plan:    See Encounters Tab for problem based charting.  Acute bronchitis Patient presents with productive cough that began 3-4 days ago. She has had thick mucus with the cough that sometimes feels like it gets stuck in her throat, but otherwise is clearing well. She has had one episode of vomiting but this was not associated with a tussive episode. She has not had fever (measured Tmax 99.5), chills, body aches, malaise. She has had some chest and head pain with severe coughing episodes. Endorses nasal congestion and sore throat but no sneezing, ear fullness. She says that a cough had not been bothering her until 3-4 days ago. No sick contacts. She has tried using cough drops, cough syrup, mucinex , robitussin without much relief. She is also now hoarse from the cough. The cough is the most bothersome symptom. She has not had trouble breathing. She did go to ED 01/03 where CXR was normal and CBC was without leukocytosis; she left before being seen. Assessment: Physical exam largely benign; lung sounds are clear, no erythema of her throat, positive for hoarse voice and intermittent dry cough. I suspect patient has acute viral bronchitis. Plan:Will send in albuterol  q4h PRN cough. Will also  send in tessalon  perles 200 mg TID for 5 days. Continue mucinex , symptomatic management with warm drinks, honey. Will also send in flonase  for nasal congestion and discuss nasal irrigation for additional symptom management. She has been instructed to contact the clinic if symptoms worsen or fever >100.4 develops.  Patient discussed with Dr. Guilloud

## 2024-01-05 NOTE — Progress Notes (Signed)
 Internal Medicine Clinic Attending  Case discussed with the resident at the time of the visit.  We reviewed the resident's history and exam and pertinent patient test results.  I agree with the assessment, diagnosis, and plan of care documented in the resident's note.

## 2024-01-05 NOTE — Assessment & Plan Note (Signed)
 Patient presents with productive cough that began 3-4 days ago. She has had thick mucus with the cough that sometimes feels like it gets stuck in her throat, but otherwise is clearing well. She has had one episode of vomiting but this was not associated with a tussive episode. She has not had fever (measured Tmax 99.5), chills, body aches, malaise. She has had some chest and head pain with severe coughing episodes. Endorses nasal congestion and sore throat but no sneezing, ear fullness. She says that a cough had not been bothering her until 3-4 days ago. No sick contacts. She has tried using cough drops, cough syrup, mucinex , robitussin without much relief. She is also now hoarse from the cough. The cough is the most bothersome symptom. She has not had trouble breathing. She did go to ED 01/03 where CXR was normal and CBC was without leukocytosis; she left before being seen. Assessment: Physical exam largely benign; lung sounds are clear, no erythema of her throat, positive for hoarse voice and intermittent dry cough. I suspect patient has acute viral bronchitis. Plan:Will send in albuterol  q4h PRN cough. Will also send in tessalon  perles 200 mg TID for 5 days. Continue mucinex , symptomatic management with warm drinks, honey. Will also send in flonase  for nasal congestion and discuss nasal irrigation for additional symptom management. She has been instructed to contact the clinic if symptoms worsen or fever >100.4 develops.

## 2024-01-06 ENCOUNTER — Other Ambulatory Visit (HOSPITAL_BASED_OUTPATIENT_CLINIC_OR_DEPARTMENT_OTHER): Payer: Self-pay

## 2024-01-09 ENCOUNTER — Other Ambulatory Visit (HOSPITAL_COMMUNITY): Payer: Self-pay

## 2024-01-09 ENCOUNTER — Other Ambulatory Visit: Payer: Self-pay | Admitting: Physician Assistant

## 2024-01-09 DIAGNOSIS — K529 Noninfective gastroenteritis and colitis, unspecified: Secondary | ICD-10-CM

## 2024-01-09 DIAGNOSIS — K8681 Exocrine pancreatic insufficiency: Secondary | ICD-10-CM

## 2024-01-09 MED ORDER — COLESTIPOL HCL 1 G PO TABS
2.0000 g | ORAL_TABLET | Freq: Two times a day (BID) | ORAL | 0 refills | Status: DC | PRN
  Filled 2024-01-28: qty 120, 30d supply, fill #0

## 2024-01-14 ENCOUNTER — Other Ambulatory Visit (HOSPITAL_COMMUNITY): Payer: Self-pay

## 2024-01-14 ENCOUNTER — Ambulatory Visit: Payer: Medicaid Other | Admitting: Student

## 2024-01-14 VITALS — BP 125/71 | HR 85 | Temp 98.4°F | Ht 66.0 in | Wt 237.5 lb

## 2024-01-14 DIAGNOSIS — Z794 Long term (current) use of insulin: Secondary | ICD-10-CM | POA: Diagnosis not present

## 2024-01-14 DIAGNOSIS — Z7985 Long-term (current) use of injectable non-insulin antidiabetic drugs: Secondary | ICD-10-CM

## 2024-01-14 DIAGNOSIS — Z23 Encounter for immunization: Secondary | ICD-10-CM | POA: Diagnosis present

## 2024-01-14 DIAGNOSIS — E1142 Type 2 diabetes mellitus with diabetic polyneuropathy: Secondary | ICD-10-CM | POA: Diagnosis not present

## 2024-01-14 DIAGNOSIS — Z7984 Long term (current) use of oral hypoglycemic drugs: Secondary | ICD-10-CM | POA: Diagnosis not present

## 2024-01-14 LAB — POCT GLYCOSYLATED HEMOGLOBIN (HGB A1C): Hemoglobin A1C: 6.8 % — AB (ref 4.0–5.6)

## 2024-01-14 LAB — GLUCOSE, CAPILLARY: Glucose-Capillary: 167 mg/dL — ABNORMAL HIGH (ref 70–99)

## 2024-01-14 MED ORDER — INSULIN GLARGINE SOLOSTAR 100 UNIT/ML ~~LOC~~ SOPN
32.0000 [IU] | PEN_INJECTOR | Freq: Two times a day (BID) | SUBCUTANEOUS | 3 refills | Status: DC
  Filled 2024-01-14 – 2024-01-28 (×2): qty 30, 47d supply, fill #0
  Filled 2024-03-15 – 2024-03-26 (×2): qty 30, 47d supply, fill #1
  Filled 2024-05-12 – 2024-05-26 (×2): qty 30, 47d supply, fill #2
  Filled 2024-06-26 – 2024-07-23 (×3): qty 30, 47d supply, fill #3

## 2024-01-14 NOTE — Patient Instructions (Addendum)
 Thank you so much for coming to the clinic today!   The only change we are making is increasing your insulin  to 32U twice a day. We will see you in three months.   If you have any questions please feel free to the call the clinic at anytime at [phone]. It was a pleasure seeing you!  Best, Dr. Kayden Hutmacher

## 2024-01-14 NOTE — Assessment & Plan Note (Signed)
 Patient presents for follow-up regarding her diabetes.  A1c checked today is 6.8, slightly increased from 6.5.  Her current regimen is Ozempic  2 mg (which she takes on Tuesdays), long-acting insulin  30 units twice a day, and Farxiga  10 mg.  On review of her glucose monitor, seems that her blood sugar elevates to the 250 range from the hours of 9 PM to midnight.  She states that she does not eat breakfast or lunch, and only eats dinner.  Reassuringly her average glucose is 184 one week, and 140 the other week.  Due to her glucose still being in the high/mid 200s at night, will increase her insulin  regimen slightly to 32 units twice a day from 30.  She is agreeable with this.  Will follow-up in 3 months.  Plan: - Increase long-acting insulin  to 32 units twice daily - Continue Farxiga  10 mg - Continue Ozempic  2 mg weekly

## 2024-01-14 NOTE — Progress Notes (Signed)
 CC: Diabetes follow up  HPI:  [name] is a 58 y.o. female living with a history stated below and presents today for diabetes follow up. Please see problem based assessment and plan for additional details.  Past Medical History:  Diagnosis Date   Anal fissure    Breast mass    Bronchitis    Carpal tunnel syndrome, bilateral    Cholelithiasis    Depression    Diabetes mellitus    Type II   Essential hypertension    GERD (gastroesophageal reflux disease)    Herpes simplex type 1 infection    vaginal   Hypokalemia    2/2 diarrhea   Menorrhagia    Obesity    Polycystic ovarian disease    Postural dizziness with presyncope 11/29/2013   Renal calculi    Renal calculus    Sleep apnea    no cpap   Sleep apnea, obstructive     Current Outpatient Medications on File Prior to Visit  Medication Sig Dispense Refill   Accu-Chek Softclix Lancets lancets Test 4 (four) times daily. (Patient not taking: Reported on 12/10/2023) 100 each 12   albuterol  (VENTOLIN  HFA) 108 (90 Base) MCG/ACT inhaler Inhale 1-2 puffs into the lungs every 6 (six) hours as needed (cough). 18 g 2   atorvastatin  (LIPITOR) 40 MG tablet Take 1 tablet (40 mg total) by mouth daily. 90 tablet 2   benzonatate  (TESSALON  PERLES) 100 MG capsule Take 2 capsules (200 mg total) by mouth 3 (three) times daily as needed for cough. 30 capsule 1   colestipol  (COLESTID ) 1 g tablet Take 2 tablets (2 g total) by mouth 2 (two) times daily as needed (DIARRHEA). 120 tablet 0   Continuous Glucose Receiver (DEXCOM G7 RECEIVER) DEVI Please use this receiver for continuous glucose monitoring if you are unable to use the SmartPhone app. 1 each 0   Continuous Glucose Sensor (DEXCOM G7 SENSOR) MISC Please attach to your arm. You can wear these for 10 days at a time. 9 each 3   dapagliflozin  propanediol (FARXIGA ) 10 MG TABS tablet Take 1 tablet (10 mg total) by mouth daily. 30 tablet 11   diclofenac  Sodium (VOLTAREN ) 1 % GEL Apply 4  g topically 4 (four) times daily. 100 g 1   dicyclomine  (BENTYL ) 20 MG tablet Take 1 tablet (20 mg total) by mouth 3 (three) times daily as needed for spasms. 50 tablet 0   famotidine  (PEPCID ) 40 MG tablet Take 1 tablet (40 mg total) by mouth at bedtime. 90 tablet 0   FLUoxetine  (PROZAC ) 40 MG capsule Take 1 capsule (40 mg total) by mouth daily. 90 capsule 0   fluticasone  (FLONASE ) 50 MCG/ACT nasal spray Place 1 spray into both nostrils daily for 7 days. 16 g 2   gabapentin  (NEURONTIN ) 300 MG capsule Take 1 capsule (300 mg total) by mouth 3 (three) times daily. 90 capsule 3   glucose blood (CONTOUR NEXT TEST) test strip Use to test blood glucose up to 4 times per day. 200 strip 12   Insulin  Pen Needle 32G X 6 MM MISC Please use to inject insulin  five times daily. (Patient not taking: Reported on 12/10/2023) 500 each 11   lipase/protease/amylase (CREON ) 36000 UNITS CPEP capsule Take 2 capsules before large meals, and 1 capsule with snacks 300 capsule 2   lisinopril  (ZESTRIL ) 10 MG tablet Take 1 tablet (10 mg total) by mouth daily. 90 tablet 3   Microlet Lancets MISC Use to check blood glucose  up to 4 times per day. (Patient not taking: Reported on 12/10/2023) 200 each 12   Semaglutide , 2 MG/DOSE, (OZEMPIC , 2 MG/DOSE,) 8 MG/3ML SOPN Inject 2 mg into the skin once a week. 3 mL 3   No current facility-administered medications on file prior to visit.    Family History  Problem Relation Age of Onset   Hypertension Mother    Breast cancer Mother    Diabetes Father    Breast cancer Maternal Aunt    Colon cancer Neg Hx    Esophageal cancer Neg Hx    Stomach cancer Neg Hx    Rectal cancer Neg Hx     Social History   Socioeconomic History   Marital status: Married    Spouse name: Not on file   Number of children: Not on file   Years of education: Not on file   Highest education level: Not on file  Occupational History   Occupation: Deli    Employer: [employer]  Tobacco Use   Smoking  status: Some Days    Current packs/day: 0.00    Types: Cigarettes    Last attempt to quit: 12/30/1978    Years since quitting: 45.0   Smokeless tobacco: Never  Vaping Use   Vaping status: Never Used  Substance and Sexual Activity   Alcohol use: Not Currently    Comment: occ.   Drug use: No   Sexual activity: Yes    Birth control/protection: Post-menopausal  Other Topics Concern   Not on file  Social History Narrative    Smoked many years ago drinks 1-2 beers on Friday and Saturdays no illegal drug use. Married and lives with her husband and sister-in-law son daughter and grandson.   Financial assistance approved for 100% discount at [facility] and has [facility] card per Richmond Chapman as of August 06, 2010 6:27 PM   Social Drivers of Health   Financial Resource Strain: Not on file  Food Insecurity: No Food Insecurity (04/11/2022)   Hunger Vital Sign    Worried About Running Out of Food in the Last Year: Never true    Ran Out of Food in the Last Year: Never true  Transportation Needs: No Transportation Needs (04/11/2022)   PRAPARE - Administrator, Civil Service (Medical): No    Lack of Transportation (Non-Medical): No  Physical Activity: Not on file  Stress: Not on file  Social Connections: Not on file  Intimate Partner Violence: Not on file    Review of Systems: ROS negative except for what is noted on the assessment and plan.  Vitals:   01/14/24 1340  BP: 125/71  Pulse: 85  Temp: 98.4 F (36.9 C)  TempSrc: Oral  SpO2: 98%  Weight: 237 lb 8 oz (107.7 kg)  Height: 5\' 6"  (1.676 m)    Physical Exam: Constitutional: well-appearing female  in no acute distress Cardiovascular: regular rate and rhythm, no m/r/g Pulmonary/Chest: normal work of breathing on room air, lungs clear to auscultation bilaterally Abdominal: soft, non-tender, non-distended   Assessment & Plan:   Type 2 diabetes mellitus with peripheral neuropathy [facility]) Patient presents for follow-up regarding  her diabetes.  A1c checked today is 6.8, slightly increased from 6.5.  Her current regimen is Ozempic  2 mg (which she takes on Tuesdays), long-acting insulin  30 units twice a day, and Farxiga  10 mg.  On review of her glucose monitor, seems that her blood sugar elevates to the 250 range from the hours of 9 PM to  midnight.  She states that she does not eat breakfast or lunch, and only eats dinner.  Reassuringly her average glucose is 184 one week, and 140 the other week.  Due to her glucose still being in the high/mid 200s at night, will increase her insulin  regimen slightly to 32 units twice a day from 30.  She is agreeable with this.  Will follow-up in 3 months.  Plan: - Increase long-acting insulin  to 32 units twice daily - Continue Farxiga  10 mg - Continue Ozempic  2 mg weekly  Patient discussed with Dr. Guilloud  Abbye Lao, M.D. [facility] Internal Medicine, PGY-2 Pager: [phone] Date 01/14/2024 Time 3:06 PM

## 2024-01-15 ENCOUNTER — Other Ambulatory Visit: Payer: Self-pay | Admitting: Physician Assistant

## 2024-01-15 ENCOUNTER — Other Ambulatory Visit (HOSPITAL_COMMUNITY): Payer: Self-pay

## 2024-01-15 DIAGNOSIS — K8681 Exocrine pancreatic insufficiency: Secondary | ICD-10-CM

## 2024-01-15 DIAGNOSIS — K529 Noninfective gastroenteritis and colitis, unspecified: Secondary | ICD-10-CM

## 2024-01-15 MED ORDER — DICYCLOMINE HCL 20 MG PO TABS
20.0000 mg | ORAL_TABLET | Freq: Three times a day (TID) | ORAL | 0 refills | Status: DC | PRN
  Filled 2024-01-28: qty 50, 17d supply, fill #0

## 2024-01-16 NOTE — Progress Notes (Signed)
 Internal Medicine Clinic Attending  Case discussed with the resident at the time of the visit.  We reviewed the resident's history and exam and pertinent patient test results.  I agree with the assessment, diagnosis, and plan of care documented in the resident's note.

## 2024-01-27 ENCOUNTER — Other Ambulatory Visit (HOSPITAL_COMMUNITY): Payer: Self-pay

## 2024-01-28 ENCOUNTER — Other Ambulatory Visit (HOSPITAL_COMMUNITY): Payer: Self-pay

## 2024-01-28 ENCOUNTER — Other Ambulatory Visit: Payer: Self-pay | Admitting: Internal Medicine

## 2024-01-28 ENCOUNTER — Other Ambulatory Visit: Payer: Self-pay | Admitting: Student

## 2024-01-28 ENCOUNTER — Other Ambulatory Visit: Payer: Self-pay

## 2024-01-28 DIAGNOSIS — E1142 Type 2 diabetes mellitus with diabetic polyneuropathy: Secondary | ICD-10-CM

## 2024-01-28 DIAGNOSIS — F320 Major depressive disorder, single episode, mild: Secondary | ICD-10-CM

## 2024-01-28 MED ORDER — FLUOXETINE HCL 40 MG PO CAPS
40.0000 mg | ORAL_CAPSULE | Freq: Every day | ORAL | 0 refills | Status: DC
  Filled 2024-01-28 – 2024-03-29 (×3): qty 90, 90d supply, fill #0

## 2024-01-28 MED ORDER — OZEMPIC (2 MG/DOSE) 8 MG/3ML ~~LOC~~ SOPN
2.0000 mg | PEN_INJECTOR | SUBCUTANEOUS | 3 refills | Status: DC
  Filled 2024-01-28: qty 3, 28d supply, fill #0
  Filled 2024-02-23: qty 3, 28d supply, fill #1
  Filled 2024-03-23 – 2024-04-02 (×2): qty 3, 28d supply, fill #2
  Filled 2024-04-23: qty 3, 28d supply, fill #3

## 2024-01-28 NOTE — Telephone Encounter (Signed)
Medication sent to pharmacy

## 2024-02-02 ENCOUNTER — Other Ambulatory Visit (HOSPITAL_COMMUNITY): Payer: Self-pay

## 2024-02-09 ENCOUNTER — Telehealth: Payer: Self-pay

## 2024-02-09 NOTE — Telephone Encounter (Signed)
 Prior Authorization for patient Amy Norris device) came through on cover my meds was submitted with last office notes and labs awaiting approval or denial.  KEY:[id number]

## 2024-02-09 NOTE — Telephone Encounter (Signed)
 DISREGARD  PA ON FILE APPROVED ON 09/09/23  EFFECTIVE UNTIL 03/08/24

## 2024-02-12 ENCOUNTER — Other Ambulatory Visit: Payer: Self-pay | Admitting: Physician Assistant

## 2024-02-12 ENCOUNTER — Other Ambulatory Visit (HOSPITAL_COMMUNITY): Payer: Self-pay

## 2024-02-12 DIAGNOSIS — K8681 Exocrine pancreatic insufficiency: Secondary | ICD-10-CM

## 2024-02-12 DIAGNOSIS — K529 Noninfective gastroenteritis and colitis, unspecified: Secondary | ICD-10-CM

## 2024-02-12 MED ORDER — DICYCLOMINE HCL 20 MG PO TABS
20.0000 mg | ORAL_TABLET | Freq: Three times a day (TID) | ORAL | 0 refills | Status: DC | PRN
  Filled 2024-04-02: qty 50, 17d supply, fill #0

## 2024-02-23 ENCOUNTER — Other Ambulatory Visit (HOSPITAL_COMMUNITY): Payer: Self-pay

## 2024-02-25 ENCOUNTER — Other Ambulatory Visit (HOSPITAL_COMMUNITY): Payer: Self-pay

## 2024-02-25 ENCOUNTER — Other Ambulatory Visit: Payer: Self-pay | Admitting: Physician Assistant

## 2024-02-25 DIAGNOSIS — K8681 Exocrine pancreatic insufficiency: Secondary | ICD-10-CM

## 2024-02-25 DIAGNOSIS — K529 Noninfective gastroenteritis and colitis, unspecified: Secondary | ICD-10-CM

## 2024-02-25 MED ORDER — COLESTIPOL HCL 1 G PO TABS
2.0000 g | ORAL_TABLET | Freq: Two times a day (BID) | ORAL | 0 refills | Status: DC | PRN
  Filled 2024-04-24: qty 120, 30d supply, fill #0

## 2024-02-26 ENCOUNTER — Ambulatory Visit (INDEPENDENT_AMBULATORY_CARE_PROVIDER_SITE_OTHER): Payer: Medicaid Other | Admitting: Physician Assistant

## 2024-02-26 ENCOUNTER — Ambulatory Visit (INDEPENDENT_AMBULATORY_CARE_PROVIDER_SITE_OTHER)
Admission: RE | Admit: 2024-02-26 | Discharge: 2024-02-26 | Disposition: A | Discharge status: Home / Self Care | Payer: Medicaid Other | Source: Ambulatory Visit | Attending: Physician Assistant | Admitting: Physician Assistant

## 2024-02-26 ENCOUNTER — Other Ambulatory Visit (HOSPITAL_COMMUNITY): Payer: Self-pay

## 2024-02-26 ENCOUNTER — Encounter: Payer: Self-pay | Admitting: Physician Assistant

## 2024-02-26 ENCOUNTER — Other Ambulatory Visit (INDEPENDENT_AMBULATORY_CARE_PROVIDER_SITE_OTHER): Payer: Medicaid Other

## 2024-02-26 VITALS — BP 132/80 | HR 83 | Ht 66.0 in | Wt 231.0 lb

## 2024-02-26 DIAGNOSIS — E1140 Type 2 diabetes mellitus with diabetic neuropathy, unspecified: Secondary | ICD-10-CM | POA: Diagnosis not present

## 2024-02-26 DIAGNOSIS — K529 Noninfective gastroenteritis and colitis, unspecified: Secondary | ICD-10-CM

## 2024-02-26 DIAGNOSIS — E1142 Type 2 diabetes mellitus with diabetic polyneuropathy: Secondary | ICD-10-CM | POA: Diagnosis not present

## 2024-02-26 DIAGNOSIS — R1030 Lower abdominal pain, unspecified: Secondary | ICD-10-CM

## 2024-02-26 DIAGNOSIS — Z6837 Body mass index (BMI) 37.0-37.9, adult: Secondary | ICD-10-CM

## 2024-02-26 DIAGNOSIS — Z9049 Acquired absence of other specified parts of digestive tract: Secondary | ICD-10-CM

## 2024-02-26 DIAGNOSIS — K219 Gastro-esophageal reflux disease without esophagitis: Secondary | ICD-10-CM

## 2024-02-26 DIAGNOSIS — Z7985 Long-term (current) use of injectable non-insulin antidiabetic drugs: Secondary | ICD-10-CM

## 2024-02-26 LAB — BASIC METABOLIC PANEL
BUN: 14 mg/dL (ref 6–23)
CO2: 28 meq/L (ref 19–32)
Calcium: 9.2 mg/dL (ref 8.4–10.5)
Chloride: 105 meq/L (ref 96–112)
Creatinine, Ser: 0.77 mg/dL (ref 0.40–1.20)
GFR: 85.5 mL/min (ref >60.00)
Glucose, Bld: 114 mg/dL — ABNORMAL HIGH (ref 70–99)
Potassium: 3.8 meq/L (ref 3.5–5.1)
Sodium: 140 meq/L (ref 135–145)

## 2024-02-26 MED ORDER — METRONIDAZOLE 250 MG PO TABS
250.0000 mg | ORAL_TABLET | Freq: Three times a day (TID) | ORAL | 0 refills | Status: AC
  Filled 2024-02-26: qty 30, 10d supply, fill #0

## 2024-02-26 NOTE — Patient Instructions (Addendum)
 Your provider has requested that you go to the basement level for lab work before leaving today. Press "B" on the elevator. The lab is located at the first door on the left as you exit the elevator.  Your provider has requested that you have an abdominal x ray before leaving today. Please go to the basement floor to our Radiology department for the test.  You have been scheduled for a CT scan of the abdomen and pelvis at [facility], 1st floor Radiology. You are scheduled on 03/03/2024 at 3:00pm.   You may take any medications as prescribed with a small amount of water, if necessary. If you take any of the following medications: METFORMIN, GLUCOPHAGE, GLUCOVANCE, AVANDAMET, RIOMET, FORTAMET, ACTOPLUS MET, JANUMET, GLUMETZA or METAGLIP, you MAY be asked to HOLD this medication 48 hours AFTER the exam.   If you have any questions regarding your exam or if you need to reschedule, you may call [facility] at [phone] between the hours of 8:00 am and 5:00 pm, Monday-Friday.    Stop creon Continue the colestipol Start on imodium before food Will do trial of metronidazole for possible SIBO with DM Be consistent with fiber, increase to 3 x a day, consider benefiber/citracel Can consider adding low dose amitriptyline at night if the CT and KUB are negative.   - Can try loperamide 4 mg initially, then 2 mg after each unformed stool for =2 days, with a maximum of 16 mg/day.  - If loperamide is not working, you could try bismuth salicylate (Pepto-Bismol) 30 mL or two tablets every 30 minutes for eight doses. Pepto-Bismol may make your stools black.   Go to the ER if any severe abdominal pain, fever, or weakness   FIBER SUPPLEMENT You can do metamucil or fibercon once or twice a day but if this causes gas/bloating please switch to Benefiber or Citracel.  Fiber is good for constipation/diarrhea/irritable bowel syndrome.  It can also help with weight loss and can help lower your bad  cholesterol (LDL).  Please do 1 TBSP in the morning in water, coffee, or tea.  It can take up to a month before you can see a difference with your bowel movements.  It is cheapest from [employer], [employer], [employer].   Here some information about pelvic floor dysfunction. This may be contributing to some of your symptoms. We will continue with our evaluation but I do want you to consider adding on fiber supplement with low-dose MiraLAX daily. We could also refer to pelvic floor physical therapy.   Pelvic Floor Dysfunction, Female Pelvic floor dysfunction (PFD) is a condition that results when the group of muscles and connective tissues that support the organs in the pelvis (pelvic floor muscles) do not work well. These muscles and their connections form a sling that supports the colon and bladder. In women, they also support the uterus. PFD causes pelvic floor muscles to be too weak, too tight, or both. In PFD, muscle movements are not coordinated. This may cause bowel or bladder problems. It may also cause pain. What are the causes? This condition may be caused by an injury to the pelvic area or by a weakening of pelvic muscles. This often results from pregnancy and childbirth or other types of strain. In many cases, the exact cause is not known. What increases the risk? The following factors may make you more likely to develop this condition: Having chronic bladder tissue inflammation (interstitial cystitis). Being an older person. Being overweight. History of  radiation treatment for cancer in the pelvic region. Previous pelvic surgery, such as removal of the uterus (hysterectomy). What are the signs or symptoms? Symptoms of this condition vary and may include: Bladder symptoms, such as: Trouble starting urination and emptying the bladder. Frequent urinary tract infections. Leaking urine when coughing, laughing, or exercising (stress incontinence). Having to pass urine urgently or  frequently. Pain when passing urine. Bowel symptoms, such as: Constipation. Urgent or frequent bowel movements. Incomplete bowel movements. Painful bowel movements. Leaking stool or gas. Unexplained genital or rectal pain. Genital or rectal muscle spasms. Low back pain. Other symptoms may include: A heavy, full, or aching feeling in the vagina. A bulge that protrudes into the vagina. Pain during or after sex. How is this diagnosed? This condition may be diagnosed based on: Your symptoms and medical history. A physical exam. During the exam, your health care provider may check your pelvic muscles for tightness, spasm, pain, or weakness. This may include a rectal exam and a pelvic exam. In some cases, you may have diagnostic tests, such as: Electrical muscle function tests. Urine flow testing. X-ray tests of bowel function. Ultrasound of the pelvic organs. How is this treated? Treatment for this condition depends on the symptoms. Treatment options include: Physical therapy. This may include Kegel exercises to help relax or strengthen the pelvic floor muscles. Biofeedback. This type of therapy provides feedback on how tight your pelvic floor muscles are so that you can learn to control them. Internal or external massage therapy. A treatment that involves electrical stimulation of the pelvic floor muscles to help control pain (transcutaneous electrical nerve stimulation, or TENS). Sound wave therapy (ultrasound) to reduce muscle spasms. Medicines, such as: Muscle relaxants. Bladder control medicines. Surgery to reconstruct or support pelvic floor muscles may be an option if other treatments do not help. Follow these instructions at home: Activity Do your usual activities as told by your health care provider. Ask your health care provider if you should modify any activities. Do pelvic floor strengthening or relaxing exercises at home as told by your physical  therapist. Lifestyle Maintain a healthy weight. Eat foods that are high in fiber, such as beans, whole grains, and fresh fruits and vegetables. Limit foods that are high in fat and processed sugars, such as fried or sweet foods. Manage stress with relaxation techniques such as yoga or meditation. General instructions If you have problems with leakage: Use absorbable pads or wear padded underwear. Wash frequently with mild soap. Keep your genital and anal area as clean and dry as possible. Ask your health care provider if you should try a barrier cream to prevent skin irritation. Take warm baths to relieve pelvic muscle tension or spasms. Take over-the-counter and prescription medicines only as told by your health care provider. Keep all follow-up visits. How is this prevented? The cause of PFD is not always known, but there are a few things you can do to reduce the risk of developing this condition, including: Staying at a healthy weight. Getting regular exercise. Managing stress. Contact a health care provider if: Your symptoms are not improving with home care. You have signs or symptoms of PFD that get worse at home. You develop new signs or symptoms. You have signs of a urinary tract infection, such as: Fever. Chills. Increased urinary frequency. A burning feeling when urinating. You have not had a bowel movement in 3 days (constipation). Summary Pelvic floor dysfunction results when the muscles and connective tissues in your pelvic floor do  not work well. These muscles and their connections form a sling that supports your colon and bladder. In women, they also support the uterus. PFD may be caused by an injury to the pelvic area or by a weakening of pelvic muscles. PFD causes pelvic floor muscles to be too weak, too tight, or a combination of both. Symptoms may vary from person to person. In most cases, PFD can be treated with physical therapies and medicines. Surgery may be an  option if other treatments do not help. This information is not intended to replace advice given to you by your health care provider. Make sure you discuss any questions you have with your health care provider. Document Revised: 04/25/2021 Document Reviewed: 04/25/2021 Elsevier Patient Education  2022 [employer].

## 2024-02-26 NOTE — Progress Notes (Signed)
 02/26/2024 Amy Norris [id number] 1966/07/13  Referring provider: Rudene Christians, DO Primary GI doctor: Dr. 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 -will get KUB and CT AB and pelvis with contrast with worsening AB pain - with history of DM, at risk for SIBO, will do trial of flaygl 250mg  TID for 10 days -increase fiber gummy to 2-3 x a day, possible pelvic floor component consider PT versus manometry - continue colesiptol 2 pills twice a day appears to helping some -Consider trial of amitriptyline if symptoms persist.  Type 2 diabetes with neuropathy On ozempic x 5 months Improved A1C from 12 to 6.5  Morbid obesity  Body mass index is 37.28 kg/m.  -Patient has been advised to make an attempt to improve diet and exercise patterns to aid in weight loss. -Recommended diet heavy in fruits and veggies and low in animal meats, cheeses, and dairy products, appropriate calorie intake  GERD no dysphagia Possible gastroparesis/GERD Lifestyle changes discussed, avoid NSAIDS, ETOH Weight loss discussed with the patient Discussed diet with small, frequent meals, soft diet with the patient.    Patient Care Team: Masters, Florentina Addison, DO as PCP - General  HISTORY OF PRESENT ILLNESS: 58 y.o. female with a past medical history of hypertension, OSA, GERD, EPI, type 2 diabetes with neuropathy, morbid obesity and others  listed below presents for evaluation of diarrhea.   02/06/2019 CT abdomen pelvis with contrast for fall showed normal liver status post cholecystectomy no biliary ductal dilation normal pancreas normal spleen no bowel inflammation, diverticulosis without diverticulitis. 02/29/2020 colonoscopy with Dr. Marina Goodell for diarrhea and screening purposes entire colon was unremarkable recall colonoscopy 10 years.  Random biopsies negative for lymphocytic colitis. 08/25/2023 labs reviewed show normal sed rate, CRP, negative fecal calprotectin normal thyroid.  CBC without anemia. Pancreatic elastase 62. A1c 6.5  Patient seen 12/10/2023 for chronic diarrhea , increased Creon to 2 capsules with meals, added fiber was told to stop soda and trial off lactose.  Was given trial of colestipol for previous cholecystectomy.  Discussed the use of AI scribe software for clinical note transcription with the patient, who gave verbal consent to proceed.  History of Present Illness   Amy Norris is a 58 year old female who presents with persistent diarrhea and fecal incontinence.  She experiences persistent diarrhea occurring five to ten minutes after eating, regardless of the type of food consumed. The stool is watery, non-oily, and does not float. This has been ongoing since at least the day she last visited the clinic. She has significantly reduced her intake of milk and milk-based foods. She reports cramping abdominal pain, particularly when needing to use the bathroom, but no blood in the stool or dark black stool.  She has fecal incontinence, often unable to reach the bathroom in time, with episodes occurring at least once a week. The incontinence is described as large volume and is associated with abdominal discomfort when she is unable to reach the  bathroom in time.  She is currently taking Creon, two pills with large meals and one with snacks, but finds it only occasionally helpful. Colestipol, two tablets twice a  day, is more effective, especially when doses are not missed. She uses a fiber gummy once a day, though not consistently.  She experiences numbness in her fingers but not in her feet. Her right foot sometimes feels swollen, though it does not appear so on examination.  She is on Ozempic and reports her blood sugar levels have improved significantly, with no worsening of symptoms after starting the medication.           She  reports that she has been smoking cigarettes. She has never used smokeless tobacco. She reports that she does not currently use alcohol. She reports that she does not use drugs.  RELEVANT LABS AND IMAGING:   CBC    Component Value Date/Time   WBC 9.5 01/02/2024 2044   RBC 5.17 (H) 01/02/2024 2044   HGB 14.9 01/02/2024 2044   HGB 15.7 08/25/2023 1517   HCT 44.9 01/02/2024 2044   HCT 48.4 (H) 08/25/2023 1517   PLT 320 01/02/2024 2044   PLT 317 08/25/2023 1517   MCV 86.8 01/02/2024 2044   MCV 90 08/25/2023 1517   MCH 28.8 01/02/2024 2044   MCHC 33.2 01/02/2024 2044   RDW 13.2 01/02/2024 2044   RDW 12.8 08/25/2023 1517   LYMPHSABS 2.1 08/25/2023 1517   MONOABS 0.6 09/05/2022 1556   EOSABS 0.1 08/25/2023 1517   BASOSABS 0.0 08/25/2023 1517   Recent Labs    08/25/23 1517 01/02/24 2044  HGB 15.7 14.9    CMP     Component Value Date/Time   NA 141 01/02/2024 2044   NA 146 (H) 04/08/2023 1629   K 3.5 01/02/2024 2044   CL 105 01/02/2024 2044   CO2 24 01/02/2024 2044   GLUCOSE 216 (H) 01/02/2024 2044   BUN 7 01/02/2024 2044   BUN 9 04/08/2023 1629   CREATININE 0.89 01/02/2024 2044   CREATININE 0.59 07/11/2014 1510   CALCIUM 9.8 01/02/2024 2044   PROT 7.4 01/02/2024 2044   PROT 6.7 01/25/2020 0950   ALBUMIN 3.8 01/02/2024 2044   ALBUMIN 4.2 01/25/2020 0950   AST 37 01/02/2024 2044   ALT 15 01/02/2024 2044   ALKPHOS 66 01/02/2024 2044   BILITOT 0.4 01/02/2024 2044   BILITOT 0.4 01/25/2020 0950   GFRNONAA >60 01/02/2024 2044   GFRNONAA >89  07/11/2014 1510   GFRAA >60 06/01/2020 1445   GFRAA >89 07/11/2014 1510      Latest Ref Rng & Units 01/02/2024    8:44 PM 09/05/2022    3:56 PM 01/25/2020    9:50 AM  Hepatic Function  Total Protein 6.5 - 8.1 g/dL 7.4  6.3  6.7   Albumin 3.5 - 5.0 g/dL 3.8  3.4  4.2   AST 15 - 41 U/L 37  31  28   ALT 0 - 44 U/L 15  15  10    Alk Phosphatase 38 - 126 U/L 66  52  63   Total Bilirubin 0.0 - 1.2 mg/dL 0.4  0.3  0.4       Current Medications:   Current Outpatient Medications (Endocrine & Metabolic):    dapagliflozin propanediol (FARXIGA) 10 MG TABS tablet, Take 1 tablet (10 mg total) by mouth daily.   Insulin Glargine Solostar (LANTUS) 100 UNIT/ML Solostar Pen, Inject 32 Units into the skin 2 (two) times daily.  Semaglutide, 2 MG/DOSE, (OZEMPIC, 2 MG/DOSE,) 8 MG/3ML SOPN, Inject 2 mg into the skin once a week.  Current Outpatient Medications (Cardiovascular):    atorvastatin (LIPITOR) 40 MG tablet, Take 1 tablet (40 mg total) by mouth daily.   colestipol (COLESTID) 1 g tablet, Take 2 tablets (2 g total) by mouth 2 (two) times daily as needed for diarrhea.   lisinopril (ZESTRIL) 10 MG tablet, Take 1 tablet (10 mg total) by mouth daily.  Current Outpatient Medications (Respiratory):    albuterol (VENTOLIN HFA) 108 (90 Base) MCG/ACT inhaler, Inhale 1-2 puffs into the lungs every 6 (six) hours as needed (cough).   benzonatate (TESSALON PERLES) 100 MG capsule, Take 2 capsules (200 mg total) by mouth 3 (three) times daily as needed for cough.   fluticasone (FLONASE) 50 MCG/ACT nasal spray, Place 1 spray into both nostrils daily for 7 days.    Current Outpatient Medications (Other):    Continuous Glucose Receiver (DEXCOM G7 RECEIVER) DEVI, Please use this receiver for continuous glucose monitoring if you are unable to use the SmartPhone app.   Continuous Glucose Sensor (DEXCOM G7 SENSOR) MISC, Please attach to your arm. You can wear these for 10 days at a time.   diclofenac Sodium (VOLTAREN)  1 % GEL, Apply 4 g topically 4 (four) times daily.   dicyclomine (BENTYL) 20 MG tablet, Take 1 tablet (20 mg total) by mouth 3 (three) times daily as needed for spasms.   famotidine (PEPCID) 40 MG tablet, Take 1 tablet (40 mg total) by mouth at bedtime.   FLUoxetine (PROZAC) 40 MG capsule, Take 1 capsule (40 mg total) by mouth daily.   gabapentin (NEURONTIN) 300 MG capsule, Take 1 capsule (300 mg total) by mouth 3 (three) times daily.   glucose blood (CONTOUR NEXT TEST) test strip, Use to test blood glucose up to 4 times per day.   Insulin Pen Needle 32G X 6 MM MISC, Please use to inject insulin five times daily.   lipase/protease/amylase (CREON) 36000 UNITS CPEP capsule, Take 2 capsules before large meals, and 1 capsule with snacks   metroNIDAZOLE (FLAGYL) 250 MG tablet, Take 1 tablet (250 mg total) by mouth 3 (three) times daily for 10 days.   Accu-Chek Softclix Lancets lancets, Test 4 (four) times daily. (Patient not taking: Reported on 02/26/2024)   Microlet Lancets MISC, Use to check blood glucose up to 4 times per day. (Patient not taking: Reported on 02/26/2024)  Medical History:  Past Medical History:  Diagnosis Date   Anal fissure    Breast mass    Bronchitis    Carpal tunnel syndrome, bilateral    Cholelithiasis    Depression    Diabetes mellitus    Type II   Essential hypertension    GERD (gastroesophageal reflux disease)    Herpes simplex type 1 infection    vaginal   Hypokalemia    2/2 diarrhea   Menorrhagia    Obesity    Polycystic ovarian disease    Postural dizziness with presyncope 11/29/2013   Renal calculi    Renal calculus    Sleep apnea    no cpap   Sleep apnea, obstructive    Allergies:  Allergies  Allergen Reactions   Vicodin [Hydrocodone-Acetaminophen] Itching    Patient states that she can take this medication. Must take with benadryl.   Metformin And Related Nausea And Vomiting    Patient states that she can take this medication     Surgical  History:  She  has a past surgical history that includes Tubal ligation (12/30/1989); Cholecystectomy (10/31/2007); and Hand surgery (11/25/2023). Family History:  Her family history includes Breast cancer in her maternal aunt and mother; Diabetes in her father; Hypertension in her mother.  REVIEW OF SYSTEMS  : All other systems reviewed and negative except where noted in the History of Present Illness.  PHYSICAL EXAM: BP 132/80   Pulse 83   Ht 5\' 6"  (1.676 m)   Wt 231 lb (104.8 kg)   LMP 10/02/2020 (Approximate)   BMI 37.28 kg/m  General Appearance: obese, no apparent distress. Head:   Normocephalic and atraumatic. Poor dentition Eyes:  sclerae anicteric,conjunctive pink  Respiratory: Respiratory effort normal, BS equal bilaterally without rales, rhonchi, wheezing. Cardio: RRR with no MRGs. Peripheral pulses intact.  Abdomen: Soft,  Obese ,active bowel sounds. No tenderness . Without guarding and Without rebound. No masses. Rectal: declines Musculoskeletal: Full ROM, Normal gait. Without edema. Skin:  Dry and intact without significant lesions or rashes Neuro: Alert and  oriented x4;  No focal deficits. Psych:  Cooperative. Normal mood and affect.    Doree Albee, PA-C 2:42 PM

## 2024-02-26 NOTE — Progress Notes (Signed)
 Noted.

## 2024-02-27 ENCOUNTER — Other Ambulatory Visit: Payer: Self-pay

## 2024-02-27 ENCOUNTER — Other Ambulatory Visit (HOSPITAL_COMMUNITY): Payer: Self-pay

## 2024-02-27 MED ORDER — PEG 3350-KCL-NA BICARB-NACL 420 G PO SOLR
ORAL | 0 refills | Status: DC
  Filled 2024-02-27: qty 4000, 1d supply, fill #0

## 2024-02-27 NOTE — Addendum Note (Signed)
 Addended by: Quentin Mulling on: 02/27/2024 07:54 AM   Modules accepted: Orders

## 2024-02-27 NOTE — Telephone Encounter (Signed)
 PT returning call. Please advise.

## 2024-03-02 ENCOUNTER — Other Ambulatory Visit: Payer: Self-pay

## 2024-03-02 ENCOUNTER — Other Ambulatory Visit: Payer: Self-pay | Admitting: Internal Medicine

## 2024-03-02 ENCOUNTER — Other Ambulatory Visit (HOSPITAL_COMMUNITY): Payer: Self-pay

## 2024-03-02 MED ORDER — BENZONATATE 100 MG PO CAPS
200.0000 mg | ORAL_CAPSULE | Freq: Three times a day (TID) | ORAL | 1 refills | Status: DC | PRN
Start: 2024-03-02 — End: 2024-08-30
  Filled 2024-03-02: qty 30, 5d supply, fill #0
  Filled 2024-06-26: qty 30, 5d supply, fill #1

## 2024-03-03 ENCOUNTER — Ambulatory Visit (HOSPITAL_COMMUNITY)
Admission: RE | Admit: 2024-03-03 | Discharge: 2024-03-03 | Disposition: A | Discharge status: Home / Self Care | Payer: Medicaid Other | Source: Ambulatory Visit | Attending: Physician Assistant | Admitting: Physician Assistant

## 2024-03-03 DIAGNOSIS — K529 Noninfective gastroenteritis and colitis, unspecified: Secondary | ICD-10-CM | POA: Diagnosis present

## 2024-03-03 DIAGNOSIS — R1030 Lower abdominal pain, unspecified: Secondary | ICD-10-CM | POA: Diagnosis present

## 2024-03-03 MED ORDER — IOHEXOL 300 MG/ML  SOLN
100.0000 mL | Freq: Once | INTRAMUSCULAR | Status: AC | PRN
  Administered 2024-03-03: 100 mL via INTRAVENOUS

## 2024-03-03 MED ORDER — IOHEXOL 300 MG/ML  SOLN
30.0000 mL | Freq: Once | INTRAMUSCULAR | Status: AC | PRN
  Administered 2024-03-03: 30 mL via ORAL

## 2024-03-09 ENCOUNTER — Other Ambulatory Visit (HOSPITAL_COMMUNITY): Payer: Self-pay

## 2024-03-10 ENCOUNTER — Other Ambulatory Visit

## 2024-03-10 DIAGNOSIS — K529 Noninfective gastroenteritis and colitis, unspecified: Secondary | ICD-10-CM

## 2024-03-12 LAB — FECAL FAT, QUALITATIVE
Fat Qual Neutral, Stl: NORMAL
Fat Qual Total, Stl: NORMAL

## 2024-03-15 ENCOUNTER — Other Ambulatory Visit (HOSPITAL_COMMUNITY): Payer: Self-pay

## 2024-03-19 ENCOUNTER — Other Ambulatory Visit (HOSPITAL_COMMUNITY): Payer: Self-pay

## 2024-03-25 ENCOUNTER — Other Ambulatory Visit (HOSPITAL_COMMUNITY): Payer: Self-pay

## 2024-03-26 ENCOUNTER — Other Ambulatory Visit (HOSPITAL_COMMUNITY): Payer: Self-pay

## 2024-03-29 ENCOUNTER — Other Ambulatory Visit: Payer: Self-pay | Admitting: Internal Medicine

## 2024-03-29 ENCOUNTER — Other Ambulatory Visit: Payer: Self-pay

## 2024-03-29 ENCOUNTER — Other Ambulatory Visit (HOSPITAL_COMMUNITY): Payer: Self-pay

## 2024-03-29 DIAGNOSIS — E1142 Type 2 diabetes mellitus with diabetic polyneuropathy: Secondary | ICD-10-CM

## 2024-03-30 ENCOUNTER — Other Ambulatory Visit (HOSPITAL_COMMUNITY): Payer: Self-pay

## 2024-03-30 MED ORDER — GABAPENTIN 300 MG PO CAPS
300.0000 mg | ORAL_CAPSULE | Freq: Three times a day (TID) | ORAL | 3 refills | Status: DC
  Filled 2024-03-30: qty 90, 30d supply, fill #0
  Filled 2024-04-26: qty 90, 30d supply, fill #1
  Filled 2024-05-26: qty 90, 30d supply, fill #2
  Filled 2024-06-25: qty 90, 30d supply, fill #3

## 2024-03-30 MED ORDER — FLUTICASONE PROPIONATE 50 MCG/ACT NA SUSP
1.0000 | Freq: Every day | NASAL | 2 refills | Status: DC
  Filled 2024-03-30: qty 16, 30d supply, fill #0
  Filled 2024-04-26: qty 16, 30d supply, fill #1
  Filled 2024-05-26: qty 16, 30d supply, fill #2

## 2024-04-01 ENCOUNTER — Other Ambulatory Visit (HOSPITAL_COMMUNITY): Payer: Self-pay

## 2024-04-02 ENCOUNTER — Other Ambulatory Visit (HOSPITAL_COMMUNITY): Payer: Self-pay

## 2024-04-12 ENCOUNTER — Other Ambulatory Visit: Payer: Self-pay | Admitting: Physician Assistant

## 2024-04-12 ENCOUNTER — Other Ambulatory Visit (HOSPITAL_COMMUNITY): Payer: Self-pay

## 2024-04-12 DIAGNOSIS — K8681 Exocrine pancreatic insufficiency: Secondary | ICD-10-CM

## 2024-04-12 DIAGNOSIS — K529 Noninfective gastroenteritis and colitis, unspecified: Secondary | ICD-10-CM

## 2024-04-12 MED ORDER — DICYCLOMINE HCL 20 MG PO TABS
20.0000 mg | ORAL_TABLET | Freq: Three times a day (TID) | ORAL | 0 refills | Status: DC | PRN
  Filled 2024-04-24: qty 50, 17d supply, fill #0

## 2024-04-15 ENCOUNTER — Other Ambulatory Visit (HOSPITAL_COMMUNITY): Payer: Self-pay

## 2024-04-23 NOTE — Progress Notes (Signed)
 04/26/2024 [name] [id number] February 03, 1966  Referring provider: Karalee Oscar, DO Primary GI doctor: Dr. 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 Suspected overflow constipation versus mixed IBS, currently more constipation Status post cholecystectomy Colonoscopy 2021 unremarkable, negative lymphocytic colitis recall 10 years negative inflammatory markers no anemia -Pancreatic elastase 62, but creon  did not help KUB 02/27 showed stool throughout colon, did trilyte in Feb that helped some, taken off colestipol  CT Ab and pelvis with contrast 03/03/24 unremarkable Trial of flagyl  250mg  TID for 10 days did not help - given linzess samples 145/290, consider motegrity -continue bentyl  twice a day, helps spasm -increase fiber gummy to 2-3 x a day -possible pelvic floor component consider PT versus manometry  Type 2 diabetes with neuropathy On ozempic  still and lantus  Improved A1C from 12 to 6.5  Morbid obesity  Body mass index is 37.63 kg/m.  -Patient has been advised to make an attempt to improve diet and exercise patterns to aid in weight loss. -Recommended diet heavy in fruits and veggies and low in animal meats, cheeses, and dairy products, appropriate calorie intake  GERD no dysphagia Possible gastroparesis/GERD Lifestyle changes discussed, avoid NSAIDS, ETOH Weight loss discussed with the patient Discussed diet with small, frequent meals, soft diet with the patient.   Patient Care Team: Masters, Alston Jerry, DO as PCP - General  HISTORY OF PRESENT ILLNESS: 59 y.o. female with a past medical history of hypertension, OSA, GERD, EPI, type 2 diabetes with neuropathy, morbid obesity and others listed below presents for evaluation of diarrhea.   02/06/2019 CT abdomen pelvis with contrast for fall showed normal liver status post cholecystectomy no biliary ductal dilation normal pancreas  normal spleen no bowel inflammation, diverticulosis without diverticulitis. 02/29/2020 colonoscopy with Dr. Elvin Hammer for diarrhea and screening purposes entire colon was unremarkable recall colonoscopy 10 years.  Random biopsies negative for lymphocytic colitis. 08/25/2023 labs reviewed show normal sed rate, CRP, negative fecal calprotectin normal thyroid.  CBC without anemia. Pancreatic elastase 62 but creon  did not help Patient seen 12/10/2023 for chronic diarrhea , increased Creon  to 2 capsules with meals, added fiber was told to stop soda and trial off lactose.  Was given trial of colestipol  for previous cholecystectomy.  Discussed the use of AI scribe software for clinical note transcription with the patient, who gave verbal consent to proceed.  History of Present Illness   Amy Norris is a 58 year old female with chronic constipation who presents with bowel movement irregularities.  She experiences ongoing bowel movement irregularities, characterized by alternating episodes of diarrhea and constipation. Her bowel movements are sometimes firm but often spongy, and she does not have a bowel movement daily. She frequently feels the urge to defecate, but sometimes nothing is expelled. Occasionally, she experiences small volume fecal incontinence, described as 'like water'.  Her history of chronic constipation was confirmed by an x-ray on February 27th, which showed stool throughout the colon. A CT scan on March 5th revealed normal bowel structures without inflammation, and a colonoscopy was negative for lymphocytic colitis. She has tried various treatments, including Creon , which was ineffective, and a 10-day course of Flagyl  in February, which did not alleviate her symptoms. She is currently taking dicyclomine  twice daily, which provides some relief, and Pepcid  at night. Additionally, she takes Ozempic  and Lantus , with Lantus  administered twice daily.  She attempted a trial of Trilyte, which  resulted in slightly increased bowel  movements but did not fully relieve her constipation. A ginger and pineapple digestive drink led to a severe episode of diarrhea the following day, requiring assistance from her husband due to lack of water at home.  Her social history [place] with [place] unreliable water supply, complicating her management of bowel issues. She has not been on fiber recently. No urinary incontinence except for occasional leakage when coughing.      She  reports that she has been smoking cigarettes. She has never used smokeless tobacco. She reports that she does not currently use alcohol. She reports that she does not use drugs.  RELEVANT LABS AND IMAGING:   CBC    Component Value Date/Time   WBC 9.5 01/02/2024 2044   RBC 5.17 (H) 01/02/2024 2044   HGB 14.9 01/02/2024 2044   HGB 15.7 08/25/2023 1517   HCT 44.9 01/02/2024 2044   HCT 48.4 (H) 08/25/2023 1517   PLT 320 01/02/2024 2044   PLT 317 08/25/2023 1517   MCV 86.8 01/02/2024 2044   MCV 90 08/25/2023 1517   MCH 28.8 01/02/2024 2044   MCHC 33.2 01/02/2024 2044   RDW 13.2 01/02/2024 2044   RDW 12.8 08/25/2023 1517   LYMPHSABS 2.1 08/25/2023 1517   MONOABS 0.6 09/05/2022 1556   EOSABS 0.1 08/25/2023 1517   BASOSABS 0.0 08/25/2023 1517   Recent Labs    08/25/23 1517 01/02/24 2044  HGB 15.7 14.9    CMP     Component Value Date/Time   NA 140 02/26/2024 1456   NA 146 (H) 04/08/2023 1629   K 3.8 02/26/2024 1456   CL 105 02/26/2024 1456   CO2 28 02/26/2024 1456   GLUCOSE 114 (H) 02/26/2024 1456   BUN 14 02/26/2024 1456   BUN 9 04/08/2023 1629   CREATININE 0.77 02/26/2024 1456   CREATININE 0.59 07/11/2014 1510   CALCIUM  9.2 02/26/2024 1456   PROT 7.4 01/02/2024 2044   PROT 6.7 01/25/2020 0950   ALBUMIN 3.8 01/02/2024 2044   ALBUMIN 4.2 01/25/2020 0950   AST 37 01/02/2024 2044   ALT 15 01/02/2024 2044   ALKPHOS 66 01/02/2024 2044   BILITOT 0.4 01/02/2024 2044   BILITOT 0.4  01/25/2020 0950   GFRNONAA >60 01/02/2024 2044   GFRNONAA >89 07/11/2014 1510   GFRAA >60 06/01/2020 1445   GFRAA >89 07/11/2014 1510      Latest Ref Rng & Units 01/02/2024    8:44 PM 09/05/2022    3:56 PM 01/25/2020    9:50 AM  Hepatic Function  Total Protein 6.5 - 8.1 g/dL 7.4  6.3  6.7   Albumin 3.5 - 5.0 g/dL 3.8  3.4  4.2   AST 15 - 41 U/L 37  31  28   ALT 0 - 44 U/L 15  15  10    Alk Phosphatase 38 - 126 U/L 66  52  63   Total Bilirubin 0.0 - 1.2 mg/dL 0.4  0.3  0.4       Current Medications:   Current Outpatient Medications (Endocrine & Metabolic):    dapagliflozin  propanediol (FARXIGA ) 10 MG TABS tablet, Take 1 tablet (10 mg total) by mouth daily.   Insulin  Glargine Solostar (LANTUS ) 100 UNIT/ML Solostar Pen, Inject 32 Units into the skin 2 (two) times daily.   Semaglutide , 2 MG/DOSE, (OZEMPIC , 2 MG/DOSE,) 8 MG/3ML SOPN, Inject 2 mg into the skin once a week.  Current Outpatient Medications (Cardiovascular):    atorvastatin  (LIPITOR) 40 MG tablet,  Take 1 tablet (40 mg total) by mouth daily.   lisinopril  (ZESTRIL ) 10 MG tablet, Take 1 tablet (10 mg total) by mouth daily.  Current Outpatient Medications (Respiratory):    albuterol  (VENTOLIN  HFA) 108 (90 Base) MCG/ACT inhaler, Inhale 1-2 puffs into the lungs every 6 (six) hours as needed (cough).   fluticasone  (FLONASE ) 50 MCG/ACT nasal spray, Place 1 spray into both nostrils daily for 7 days.   benzonatate  (TESSALON  PERLES) 100 MG capsule, Take 2 capsules (200 mg total) by mouth 3 (three) times daily as needed for cough. (Patient not taking: Reported on 04/26/2024)    Current Outpatient Medications (Other):    Accu-Chek Softclix Lancets lancets, Test 4 (four) times daily.   Continuous Glucose Receiver (DEXCOM G7 RECEIVER) DEVI, Please use this receiver for continuous glucose monitoring if you are unable to use the SmartPhone app.   Continuous Glucose Sensor (DEXCOM G7 SENSOR) MISC, Please attach to your arm. You can wear  these for 10 days at a time.   dicyclomine  (BENTYL ) 20 MG tablet, Take 1 tablet (20 mg total) by mouth 3 (three) times daily as needed for spasms.   FLUoxetine  (PROZAC ) 40 MG capsule, Take 1 capsule (40 mg total) by mouth daily.   gabapentin  (NEURONTIN ) 300 MG capsule, Take 1 capsule (300 mg total) by mouth 3 (three) times daily.   glucose blood (CONTOUR NEXT TEST) test strip, Use to test blood glucose up to 4 times per day.   Insulin  Pen Needle 32G X 6 MM MISC, Please use to inject insulin  five times daily.   linaclotide (LINZESS) 145 MCG CAPS capsule, Take 1 capsule (145 mcg total) by mouth daily before breakfast. Exp: 02-2025, lot: [id number]   linaclotide (LINZESS) 290 MCG CAPS capsule, Take 1 capsule (290 mcg total) by mouth daily before breakfast. Exp: 05-2024, lot: [id number]   Microlet Lancets MISC, Use to check blood glucose up to 4 times per day.   diclofenac  Sodium (VOLTAREN ) 1 % GEL, Apply 4 g topically 4 (four) times daily. (Patient not taking: Reported on 04/26/2024)   famotidine  (PEPCID ) 40 MG tablet, Take 1 tablet (40 mg total) by mouth at bedtime. (Patient not taking: Reported on 04/26/2024)   lipase/protease/amylase (CREON ) 36000 UNITS CPEP capsule, Take 2 capsules before large meals, and 1 capsule with snacks (Patient not taking: Reported on 04/26/2024)  Medical History:  Past Medical History:  Diagnosis Date   Anal fissure    Breast mass    Bronchitis    Carpal tunnel syndrome, bilateral    Cholelithiasis    Depression    Diabetes mellitus    Type II   Essential hypertension    GERD (gastroesophageal reflux disease)    Herpes simplex type 1 infection    vaginal   Hypokalemia    2/2 diarrhea   Menorrhagia    Obesity    Polycystic ovarian disease    Postural dizziness with presyncope 11/29/2013   Renal calculi    Renal calculus    Sleep apnea    no cpap   Sleep apnea, obstructive    Allergies:  Allergies  Allergen Reactions   Vicodin [Hydrocodone -Acetaminophen ]  Itching    Patient states that she can take this medication. Must take with benadryl.   Metformin  And Related Nausea And Vomiting    Patient states that she can take this medication     Surgical History:  She  has a past surgical history that includes Tubal ligation (12/30/1989); Cholecystectomy (10/31/2007); and Hand surgery (11/25/2023). Family History:  Her family history includes Breast cancer in her maternal aunt and mother; Diabetes in her father; Hypertension in her mother.  REVIEW OF SYSTEMS  : All other systems reviewed and negative except where noted in the History of Present Illness.  PHYSICAL EXAM: BP 108/64   Pulse 74   Ht 5\' 6"  (1.676 m)   Wt 233 lb 2 oz (105.7 kg)   LMP 10/02/2020 (Approximate)   SpO2 96%   BMI 37.63 kg/m  General Appearance: obese, no apparent distress. Head:   Normocephalic and atraumatic. Poor dentition Eyes:  sclerae anicteric,conjunctive pink  Respiratory: Respiratory effort normal, BS equal bilaterally without rales, rhonchi, wheezing. Cardio: RRR with no MRGs. Peripheral pulses intact.  Abdomen: Soft,  Obese ,active bowel sounds. No tenderness . Without guarding and Without rebound. No masses. Rectal: declines Musculoskeletal: Full ROM, Normal gait. Without edema. Skin:  Dry and intact without significant lesions or rashes Neuro: Alert and  oriented x4;  No focal deficits. Psych:  Cooperative. Normal mood and affect.    Edmonia Gottron, PA-C 3:48 PM

## 2024-04-24 ENCOUNTER — Other Ambulatory Visit: Payer: Self-pay | Admitting: Physician Assistant

## 2024-04-24 ENCOUNTER — Other Ambulatory Visit: Payer: Self-pay | Admitting: Student

## 2024-04-24 ENCOUNTER — Other Ambulatory Visit: Payer: Self-pay | Admitting: Internal Medicine

## 2024-04-24 ENCOUNTER — Other Ambulatory Visit: Payer: Self-pay

## 2024-04-24 DIAGNOSIS — E1142 Type 2 diabetes mellitus with diabetic polyneuropathy: Secondary | ICD-10-CM

## 2024-04-24 DIAGNOSIS — E7800 Pure hypercholesterolemia, unspecified: Secondary | ICD-10-CM

## 2024-04-24 DIAGNOSIS — F320 Major depressive disorder, single episode, mild: Secondary | ICD-10-CM

## 2024-04-25 ENCOUNTER — Other Ambulatory Visit: Payer: Self-pay

## 2024-04-26 ENCOUNTER — Encounter: Payer: Self-pay | Admitting: Physician Assistant

## 2024-04-26 ENCOUNTER — Ambulatory Visit (INDEPENDENT_AMBULATORY_CARE_PROVIDER_SITE_OTHER): Payer: Medicaid Other | Admitting: Physician Assistant

## 2024-04-26 ENCOUNTER — Other Ambulatory Visit: Payer: Self-pay

## 2024-04-26 ENCOUNTER — Other Ambulatory Visit (HOSPITAL_COMMUNITY): Payer: Self-pay

## 2024-04-26 VITALS — BP 108/64 | HR 74 | Ht 66.0 in | Wt 233.1 lb

## 2024-04-26 DIAGNOSIS — Z9049 Acquired absence of other specified parts of digestive tract: Secondary | ICD-10-CM

## 2024-04-26 DIAGNOSIS — K529 Noninfective gastroenteritis and colitis, unspecified: Secondary | ICD-10-CM

## 2024-04-26 DIAGNOSIS — E1142 Type 2 diabetes mellitus with diabetic polyneuropathy: Secondary | ICD-10-CM | POA: Diagnosis not present

## 2024-04-26 DIAGNOSIS — Z6837 Body mass index (BMI) 37.0-37.9, adult: Secondary | ICD-10-CM

## 2024-04-26 DIAGNOSIS — R1030 Lower abdominal pain, unspecified: Secondary | ICD-10-CM

## 2024-04-26 DIAGNOSIS — K219 Gastro-esophageal reflux disease without esophagitis: Secondary | ICD-10-CM | POA: Diagnosis not present

## 2024-04-26 DIAGNOSIS — Z794 Long term (current) use of insulin: Secondary | ICD-10-CM

## 2024-04-26 DIAGNOSIS — Z7985 Long-term (current) use of injectable non-insulin antidiabetic drugs: Secondary | ICD-10-CM

## 2024-04-26 MED ORDER — ATORVASTATIN CALCIUM 40 MG PO TABS
40.0000 mg | ORAL_TABLET | Freq: Every day | ORAL | 2 refills | Status: DC
  Filled 2024-05-04 – 2024-05-26 (×3): qty 90, 90d supply, fill #0
  Filled 2024-06-26 – 2024-08-24 (×3): qty 90, 90d supply, fill #1
  Filled 2024-11-22: qty 90, 90d supply, fill #2

## 2024-04-26 MED ORDER — FLUOXETINE HCL 40 MG PO CAPS
40.0000 mg | ORAL_CAPSULE | Freq: Every day | ORAL | 0 refills | Status: DC
  Filled 2024-05-04 – 2024-06-26 (×2): qty 90, 90d supply, fill #0

## 2024-04-26 MED ORDER — LINACLOTIDE 290 MCG PO CAPS
290.0000 ug | ORAL_CAPSULE | Freq: Every day | ORAL | 0 refills | Status: AC
Start: 2024-04-26 — End: ?

## 2024-04-26 MED ORDER — LINACLOTIDE 145 MCG PO CAPS: 145.0000 ug | ORAL_CAPSULE | Freq: Every day | ORAL | 0 refills | Status: AC

## 2024-04-26 NOTE — Telephone Encounter (Signed)
 Medication sent to pharmacy

## 2024-04-26 NOTE — Patient Instructions (Addendum)
 You have been scheduled for a follow up appointment with Dr. Elvin Hammer on Monday, 7-28 at 9:20 am. Please arrive 10 minutes early for registration. If you need to reschedule or cancel this appointment please call [phone] as soon as possible. Thank you.   Linzess try the 145 mcg first, can increase to the 290 mcg *IBS-C patients may begin to experience relief from belly pain and overall abdominal symptoms (pain, discomfort, and bloating) in about 1 week,  with symptoms typically improving over 12 weeks.  If this does not help, we can switch to motegrity.   Take at least 30 minutes before the first meal of the day on an empty stomach You can have a loose stool if you eat a high-fat breakfast. Give it at least 7 days, may have more bowel movements during that time.   The diarrhea should go away and you should start having normal, complete, full bowel movements.  It may be helpful to start treatment when you can be near the comfort of your own bathroom, such as a weekend.  After you are out we can send in a prescription if you did well, there is a prescription card  Toileting tips to help with your constipation - Drink at least 64-80 ounces of water/liquid per day. - Establish a time to try to move your bowels every day.  For many people, this is after a cup of coffee or after a meal such as breakfast. - Sit all of the way back on the toilet keeping your back fairly straight and while sitting up, try to rest the tops of your forearms on your upper thighs.   - Raising your feet with a step stool/squatty potty can be helpful to improve the angle that allows your stool to pass through the rectum. - Relax the rectum feeling it bulge toward the toilet water.  If you feel your rectum raising toward your body, you are contracting rather than relaxing. - Breathe in and slowly exhale. "Belly breath" by expanding your belly towards your belly button. Keep belly expanded as you gently direct pressure down and  back to the anus.  A low pitched GRRR sound can assist with increasing intra-abdominal pressure.  (Can also trying to blow on a pinwheel and make it move, this helps with the same belly breathing) - Repeat 3-4 times. If unsuccessful, contract the pelvic floor to restore normal tone and get off the toilet.  Avoid excessive straining. - To reduce excessive wiping by teaching your anus to normally contract, place hands on outer aspect of knees and resist knee movement outward.  Hold 5-10 second then place hands just inside of knees and resist inward movement of knees.  Hold 5 seconds.  Repeat a few times each way.  Go to the ER if unable to pass gas, severe AB pain, unable to hold down food, any shortness of breath of chest pain.  Will refer to pelvic floor PT for fecal incontinence  Thank you for entrusting me with your care and for choosing [facility], Santina Cull, P.A.-C

## 2024-04-27 ENCOUNTER — Other Ambulatory Visit (HOSPITAL_COMMUNITY): Payer: Self-pay

## 2024-04-27 ENCOUNTER — Other Ambulatory Visit: Payer: Self-pay

## 2024-04-27 MED ORDER — DAPAGLIFLOZIN PROPANEDIOL 10 MG PO TABS
10.0000 mg | ORAL_TABLET | Freq: Every day | ORAL | 11 refills | Status: AC
  Filled 2024-05-04 – 2024-05-26 (×3): qty 30, 30d supply, fill #0
  Filled 2024-06-25: qty 30, 30d supply, fill #1
  Filled 2024-07-26: qty 30, 30d supply, fill #2
  Filled 2024-08-25: qty 30, 30d supply, fill #3
  Filled 2024-09-24 – 2024-11-22 (×6): qty 30, 30d supply, fill #4
  Filled 2024-12-24: qty 30, 30d supply, fill #5
  Filled 2025-01-26: qty 30, 30d supply, fill #6

## 2024-04-27 NOTE — Telephone Encounter (Signed)
 Medication sent to pharmacy

## 2024-04-27 NOTE — Progress Notes (Signed)
 Noted.

## 2024-05-04 ENCOUNTER — Other Ambulatory Visit (HOSPITAL_COMMUNITY): Payer: Self-pay

## 2024-05-12 ENCOUNTER — Other Ambulatory Visit (HOSPITAL_COMMUNITY): Payer: Self-pay

## 2024-05-13 ENCOUNTER — Other Ambulatory Visit (HOSPITAL_COMMUNITY): Payer: Self-pay

## 2024-05-13 ENCOUNTER — Other Ambulatory Visit: Payer: Self-pay | Admitting: Physician Assistant

## 2024-05-13 DIAGNOSIS — K529 Noninfective gastroenteritis and colitis, unspecified: Secondary | ICD-10-CM

## 2024-05-13 DIAGNOSIS — K8681 Exocrine pancreatic insufficiency: Secondary | ICD-10-CM

## 2024-05-13 MED ORDER — DICYCLOMINE HCL 20 MG PO TABS
20.0000 mg | ORAL_TABLET | Freq: Three times a day (TID) | ORAL | 0 refills | Status: DC | PRN
  Filled 2024-06-26: qty 50, 17d supply, fill #0

## 2024-05-14 ENCOUNTER — Other Ambulatory Visit (HOSPITAL_COMMUNITY): Payer: Self-pay

## 2024-05-17 ENCOUNTER — Other Ambulatory Visit (HOSPITAL_COMMUNITY): Payer: Self-pay

## 2024-05-25 ENCOUNTER — Other Ambulatory Visit (HOSPITAL_COMMUNITY): Payer: Self-pay

## 2024-05-26 ENCOUNTER — Other Ambulatory Visit (HOSPITAL_COMMUNITY): Payer: Self-pay

## 2024-05-26 ENCOUNTER — Other Ambulatory Visit: Payer: Self-pay

## 2024-06-02 ENCOUNTER — Other Ambulatory Visit (HOSPITAL_COMMUNITY): Payer: Self-pay

## 2024-06-16 ENCOUNTER — Encounter: Payer: Self-pay | Admitting: *Deleted

## 2024-06-24 ENCOUNTER — Other Ambulatory Visit: Payer: Self-pay | Admitting: Physician Assistant

## 2024-06-24 ENCOUNTER — Other Ambulatory Visit (HOSPITAL_COMMUNITY): Payer: Self-pay

## 2024-06-24 MED ORDER — FAMOTIDINE 40 MG PO TABS
40.0000 mg | ORAL_TABLET | Freq: Every day | ORAL | 0 refills | Status: DC
  Filled 2024-06-26: qty 90, 90d supply, fill #0

## 2024-06-25 ENCOUNTER — Other Ambulatory Visit (HOSPITAL_COMMUNITY): Payer: Self-pay

## 2024-06-25 ENCOUNTER — Other Ambulatory Visit: Payer: Self-pay | Admitting: Internal Medicine

## 2024-06-25 ENCOUNTER — Other Ambulatory Visit: Payer: Self-pay

## 2024-06-25 MED ORDER — FLUTICASONE PROPIONATE 50 MCG/ACT NA SUSP
1.0000 | Freq: Every day | NASAL | 2 refills | Status: DC
  Filled 2024-06-25: qty 16, 30d supply, fill #0
  Filled 2024-07-26: qty 16, 30d supply, fill #1
  Filled 2024-08-25: qty 16, 30d supply, fill #2

## 2024-06-26 ENCOUNTER — Other Ambulatory Visit: Payer: Self-pay | Admitting: Internal Medicine

## 2024-06-26 ENCOUNTER — Other Ambulatory Visit (HOSPITAL_COMMUNITY): Payer: Self-pay

## 2024-06-26 DIAGNOSIS — E1142 Type 2 diabetes mellitus with diabetic polyneuropathy: Secondary | ICD-10-CM

## 2024-06-26 DIAGNOSIS — I10 Essential (primary) hypertension: Secondary | ICD-10-CM

## 2024-06-28 ENCOUNTER — Other Ambulatory Visit: Payer: Self-pay | Admitting: Student

## 2024-06-28 ENCOUNTER — Other Ambulatory Visit (HOSPITAL_COMMUNITY): Payer: Self-pay

## 2024-06-28 ENCOUNTER — Other Ambulatory Visit: Payer: Self-pay

## 2024-06-28 DIAGNOSIS — I10 Essential (primary) hypertension: Secondary | ICD-10-CM

## 2024-06-28 DIAGNOSIS — E1142 Type 2 diabetes mellitus with diabetic polyneuropathy: Secondary | ICD-10-CM

## 2024-06-28 MED ORDER — OZEMPIC (2 MG/DOSE) 8 MG/3ML ~~LOC~~ SOPN
2.0000 mg | PEN_INJECTOR | SUBCUTANEOUS | 3 refills | Status: DC
  Filled 2024-06-28: qty 3, 28d supply, fill #0

## 2024-06-28 MED ORDER — LISINOPRIL 10 MG PO TABS
10.0000 mg | ORAL_TABLET | Freq: Every day | ORAL | 3 refills | Status: AC
  Filled 2024-06-28 – 2024-07-26 (×2): qty 90, 90d supply, fill #0
  Filled 2024-08-30 – 2024-11-22 (×5): qty 90, 90d supply, fill #1
  Filled 2025-01-03 – 2025-01-26 (×2): qty 90, 90d supply, fill #2

## 2024-06-28 MED ORDER — LISINOPRIL 10 MG PO TABS
10.0000 mg | ORAL_TABLET | Freq: Every day | ORAL | 3 refills | Status: DC
  Filled 2024-06-28: qty 30, 30d supply, fill #0

## 2024-06-28 MED ORDER — OZEMPIC (2 MG/DOSE) 8 MG/3ML ~~LOC~~ SOPN
2.0000 mg | PEN_INJECTOR | SUBCUTANEOUS | 3 refills | Status: DC
  Filled 2024-06-28: qty 3, 28d supply, fill #0
  Filled 2024-07-22: qty 3, 28d supply, fill #1
  Filled 2024-08-23 – 2024-08-31 (×3): qty 3, 28d supply, fill #2
  Filled 2024-10-02 – 2024-11-30 (×2): qty 3, 28d supply, fill #3

## 2024-07-01 ENCOUNTER — Other Ambulatory Visit (HOSPITAL_COMMUNITY): Payer: Self-pay

## 2024-07-01 ENCOUNTER — Other Ambulatory Visit: Payer: Self-pay

## 2024-07-06 ENCOUNTER — Other Ambulatory Visit (HOSPITAL_COMMUNITY): Payer: Self-pay

## 2024-07-08 ENCOUNTER — Other Ambulatory Visit (HOSPITAL_COMMUNITY): Payer: Self-pay

## 2024-07-09 ENCOUNTER — Other Ambulatory Visit: Payer: Self-pay | Admitting: Internal Medicine

## 2024-07-09 ENCOUNTER — Other Ambulatory Visit: Payer: Self-pay

## 2024-07-09 ENCOUNTER — Other Ambulatory Visit (HOSPITAL_COMMUNITY): Payer: Self-pay

## 2024-07-09 MED ORDER — ACCU-CHEK GUIDE TEST VI STRP
ORAL_STRIP | Freq: Four times a day (QID) | 12 refills | Status: AC
  Filled 2024-08-31: qty 200, 50d supply, fill #0
  Filled 2025-01-03: qty 200, 50d supply, fill #1

## 2024-07-12 ENCOUNTER — Other Ambulatory Visit (HOSPITAL_COMMUNITY): Payer: Self-pay

## 2024-07-12 ENCOUNTER — Other Ambulatory Visit: Payer: Self-pay | Admitting: Physician Assistant

## 2024-07-12 DIAGNOSIS — K529 Noninfective gastroenteritis and colitis, unspecified: Secondary | ICD-10-CM

## 2024-07-12 DIAGNOSIS — K8681 Exocrine pancreatic insufficiency: Secondary | ICD-10-CM

## 2024-07-12 MED ORDER — DICYCLOMINE HCL 20 MG PO TABS
20.0000 mg | ORAL_TABLET | Freq: Three times a day (TID) | ORAL | 0 refills | Status: DC | PRN
  Filled 2024-07-28: qty 50, 17d supply, fill #0

## 2024-07-13 ENCOUNTER — Ambulatory Visit: Attending: Physician Assistant | Admitting: Physical Therapy

## 2024-07-13 DIAGNOSIS — R1030 Lower abdominal pain, unspecified: Secondary | ICD-10-CM | POA: Diagnosis not present

## 2024-07-13 DIAGNOSIS — K529 Noninfective gastroenteritis and colitis, unspecified: Secondary | ICD-10-CM | POA: Insufficient documentation

## 2024-07-22 ENCOUNTER — Other Ambulatory Visit (HOSPITAL_COMMUNITY): Payer: Self-pay

## 2024-07-23 ENCOUNTER — Other Ambulatory Visit (HOSPITAL_COMMUNITY): Payer: Self-pay

## 2024-07-26 ENCOUNTER — Ambulatory Visit: Admitting: Internal Medicine

## 2024-07-26 ENCOUNTER — Other Ambulatory Visit: Payer: Self-pay

## 2024-07-26 ENCOUNTER — Other Ambulatory Visit (HOSPITAL_COMMUNITY): Payer: Self-pay

## 2024-07-26 DIAGNOSIS — E1142 Type 2 diabetes mellitus with diabetic polyneuropathy: Secondary | ICD-10-CM

## 2024-07-28 ENCOUNTER — Other Ambulatory Visit: Payer: Self-pay

## 2024-07-28 ENCOUNTER — Other Ambulatory Visit: Payer: Self-pay | Admitting: Physician Assistant

## 2024-07-28 ENCOUNTER — Other Ambulatory Visit (HOSPITAL_COMMUNITY): Payer: Self-pay

## 2024-07-28 DIAGNOSIS — F320 Major depressive disorder, single episode, mild: Secondary | ICD-10-CM

## 2024-07-28 MED ORDER — FAMOTIDINE 40 MG PO TABS
40.0000 mg | ORAL_TABLET | Freq: Every day | ORAL | 0 refills | Status: AC
  Filled 2024-07-28 – 2025-01-03 (×3): qty 90, 90d supply, fill #0

## 2024-07-28 MED ORDER — FLUOXETINE HCL 40 MG PO CAPS
40.0000 mg | ORAL_CAPSULE | Freq: Every day | ORAL | 0 refills | Status: DC
  Filled 2024-07-28: qty 90, 90d supply, fill #0

## 2024-07-28 NOTE — Telephone Encounter (Signed)
 Medication sent to pharmacy

## 2024-07-29 ENCOUNTER — Other Ambulatory Visit (HOSPITAL_COMMUNITY): Payer: Self-pay

## 2024-07-29 MED ORDER — GABAPENTIN 300 MG PO CAPS
300.0000 mg | ORAL_CAPSULE | Freq: Three times a day (TID) | ORAL | 3 refills | Status: DC
  Filled 2024-07-29: qty 90, 30d supply, fill #0
  Filled 2024-08-25: qty 90, 30d supply, fill #1
  Filled 2024-09-24 – 2024-11-22 (×6): qty 90, 30d supply, fill #2
  Filled 2024-12-24: qty 90, 30d supply, fill #3

## 2024-08-01 ENCOUNTER — Other Ambulatory Visit: Payer: Self-pay

## 2024-08-06 ENCOUNTER — Other Ambulatory Visit (HOSPITAL_COMMUNITY): Payer: Self-pay

## 2024-08-06 ENCOUNTER — Other Ambulatory Visit: Payer: Self-pay | Admitting: Student

## 2024-08-06 DIAGNOSIS — E1142 Type 2 diabetes mellitus with diabetic polyneuropathy: Secondary | ICD-10-CM

## 2024-08-06 MED ORDER — DEXCOM G7 SENSOR MISC
1.0000 | 3 refills | Status: AC
  Filled 2024-08-06: qty 9, 90d supply, fill #0
  Filled 2024-08-16 – 2024-08-18 (×2): qty 3, 30d supply, fill #0
  Filled 2024-08-30 – 2024-09-10 (×6): qty 3, 30d supply, fill #1
  Filled 2024-09-13 – 2024-10-12 (×2): qty 3, 30d supply, fill #2
  Filled 2024-10-24 – 2024-11-05 (×2): qty 3, 30d supply, fill #3
  Filled 2024-12-06: qty 3, 30d supply, fill #4
  Filled 2025-01-03: qty 3, 30d supply, fill #5

## 2024-08-06 NOTE — Telephone Encounter (Signed)
 Medication sent to pharmacy

## 2024-08-09 ENCOUNTER — Encounter: Admitting: Internal Medicine

## 2024-08-09 ENCOUNTER — Other Ambulatory Visit: Payer: Self-pay

## 2024-08-09 ENCOUNTER — Other Ambulatory Visit (HOSPITAL_COMMUNITY): Payer: Self-pay

## 2024-08-09 ENCOUNTER — Ambulatory Visit (INDEPENDENT_AMBULATORY_CARE_PROVIDER_SITE_OTHER): Payer: Self-pay

## 2024-08-09 VITALS — BP 133/81 | HR 82 | Temp 98.8°F | Ht 66.0 in | Wt 231.6 lb

## 2024-08-09 DIAGNOSIS — I10 Essential (primary) hypertension: Secondary | ICD-10-CM

## 2024-08-09 DIAGNOSIS — Z1159 Encounter for screening for other viral diseases: Secondary | ICD-10-CM

## 2024-08-09 DIAGNOSIS — Z7985 Long-term (current) use of injectable non-insulin antidiabetic drugs: Secondary | ICD-10-CM | POA: Diagnosis not present

## 2024-08-09 DIAGNOSIS — Z794 Long term (current) use of insulin: Secondary | ICD-10-CM | POA: Diagnosis not present

## 2024-08-09 DIAGNOSIS — F320 Major depressive disorder, single episode, mild: Secondary | ICD-10-CM

## 2024-08-09 DIAGNOSIS — E1142 Type 2 diabetes mellitus with diabetic polyneuropathy: Secondary | ICD-10-CM

## 2024-08-09 DIAGNOSIS — F32A Depression, unspecified: Secondary | ICD-10-CM

## 2024-08-09 DIAGNOSIS — Z1231 Encounter for screening mammogram for malignant neoplasm of breast: Secondary | ICD-10-CM

## 2024-08-09 DIAGNOSIS — E785 Hyperlipidemia, unspecified: Secondary | ICD-10-CM

## 2024-08-09 DIAGNOSIS — Z7984 Long term (current) use of oral hypoglycemic drugs: Secondary | ICD-10-CM

## 2024-08-09 LAB — POCT GLYCOSYLATED HEMOGLOBIN (HGB A1C): Hemoglobin A1C: 7 % — AB (ref 4.0–5.6)

## 2024-08-09 LAB — GLUCOSE, CAPILLARY: Glucose-Capillary: 113 mg/dL — ABNORMAL HIGH (ref 70–99)

## 2024-08-09 NOTE — Assessment & Plan Note (Addendum)
  Lab Results  Component Value Date   HGBA1C 7.0 (A) 08/09/2024   HGBA1C 6.8 (A) 01/14/2024   HGBA1C 6.5 (A) 10/21/2023       Latest Ref Rng & Units 02/26/2024    2:56 PM 01/02/2024    8:44 PM 04/08/2023    4:29 PM  BMP  Glucose 70 - 99 mg/dL 885  783  73   BUN 6 - 23 mg/dL 14  7  9    Creatinine 0.40 - 1.20 mg/dL 9.22  9.10  9.36   BUN/Creat Ratio 9 - 23   14   Sodium 135 - 145 mEq/L 140  141  146   Potassium 3.5 - 5.1 mEq/L 3.8  3.5  3.9   Chloride 96 - 112 mEq/L 105  105  105   CO2 19 - 32 mEq/L 28  24  26    Calcium  8.4 - 10.5 mg/dL 9.2  9.8  9.6     Medications: Long-acting insulin  32 units twice daily; Continue Farxiga  10 mg; Ozempic  2 mg weekly Timing of Medications: Takes in AM (or midday, when she gets up), 9-9:30 at night; Takes Farxiga  at night, Ozmepic takes every Monday (no constipation or diarrhea) Blood Sugars at home: Average glucose 164; when its higher she says she might miss a day of medication Diet and Exercise: dont eat as much as she used to. Has met with Mrs. Arland for nutrition; does not eat breakfast and lunch. For dinner will eat sandwiches, hotdog, hamberger. No exercise. Spoke about walking a couple times a week.  Diabetic Counseling: has met with donna previously, stated she does not feel she needs to meet with her again. Opthalmology: Patient reported evaluation in June/July Neuropathy: Gabapentin  300 mg TID. Right leg feels swollen/numb sometimes   Plan:  -microalbumin/Cr Ratio ordered -no changes to medication today with CC depression, likely contributing to patient's overall decrease in activity and difficulty adhering to medications -counseled patient on importance of exercise and diet with diabetes. Instructed patient to try and take daily walks and eat 3 small meals a day instead of 1 large meal at dinner  -Continue Insulin  32 units twice daily, Farxiga  10 mg, and Ozempic  2 mg weekly

## 2024-08-09 NOTE — Assessment & Plan Note (Signed)
>>  ASSESSMENT AND PLAN FOR DEPRESSION WRITTEN ON 08/09/2024  7:01 PM BY RIHNER, EMILIE, DO  Patient's sister-in-law is in room and states that patient has had worsening mood, decreased activity, and more fatigue recently. Patient states that there are increased stressors around her life that are contributing to her worsening depression. Patient takes 40 mg of Prozac  daily, but last week increased to 80 mg daily herself hoping for improvement of symptoms. Patient has not noticed a change at this time. Patient denies SI/SH/HI. Patient denies any family history or personal history of mania (described to patient as feeling on top of the world, feeling like she did not need to sleep for periods of time, or periods of spending exorbitant amounts of money). Patient denies hallucinations. Patient also endorsed worsening fatigue and hair loss.  Plan:  -EKG performed in setting of self-increased Prozac  dosing. Normal Sinus rhythm. Qtc 392. -ambulatory referral to Behavioral Health with [facility]  -Continue Prozac  80 mg daily  -Wellbutrin  150 mg once daily  -TSH ordered

## 2024-08-09 NOTE — Assessment & Plan Note (Addendum)
 Patient's sister-in-law is in room and states that patient has had worsening mood, decreased activity, and more fatigue recently. Patient states that there are increased stressors around her life that are contributing to her worsening depression. Patient takes 40 mg of Prozac  daily, but last week increased to 80 mg daily herself hoping for improvement of symptoms. Patient has not noticed a change at this time. Patient denies SI/SH/HI. Patient denies any family history or personal history of mania (described to patient as feeling on top of the world, feeling like she did not need to sleep for periods of time, or periods of spending exorbitant amounts of money). Patient denies hallucinations. Patient also endorsed worsening fatigue and hair loss.  Plan:  -EKG performed in setting of self-increased Prozac  dosing. Normal Sinus rhythm. Qtc 392. -ambulatory referral to Behavioral Health with [facility]  -Continue Prozac  80 mg daily  -Wellbutrin  150 mg once daily  -TSH ordered

## 2024-08-09 NOTE — Assessment & Plan Note (Addendum)
 Blood pressure today 133/81 -Continue lisinopril  10 mg

## 2024-08-09 NOTE — Patient Instructions (Addendum)
 Today we discussed the following medical conditions and plan:   Depression  -we are checking your thyroid labs to see if this is contributing  -sending referral to [facility] for Behavioral Health (Therapy)  -Continue taking Prozac  80 mg -I will send in for Wellbutrin  to your pharmacy   Diabetes -continue your diabetes medications. Try and take them daily and continue monitoring your sugars -try eating 3 meals a day -try going to exercise (go on walks) daily   General Health  Go to your pharmacy to get  -shingles vaccine I have sent in a referral for your -mammogram  We will see you in 1 month  We look forward to seeing you next time. Please call our clinic at [phone] if you have any questions or concerns. The best time to call is Monday-Friday from 9am-4pm, but there is someone available 24/7. If you need medication refills, please notify your pharmacy one week in advance and they will send us  a request.   Thank you for trusting me with your care. Wishing you the best!   Sallyanne Primas, DO  [facility]

## 2024-08-09 NOTE — Progress Notes (Signed)
 CC: Depression.   HPI:  [name] is a 58 y.o. female with pertinent past medical history of Type 2 Diabetes (further medical history stated below) and presents today for Depression. Please see problem based assessment and plan for additional details.  Last clinic appointment: 01/14/2024  Last Pertinent Labs Documented:     Latest Ref Rng & Units 02/26/2024    2:56 PM 01/02/2024    8:44 PM 04/08/2023    4:29 PM  BMP  Glucose 70 - 99 mg/dL 885  783  73   BUN 6 - 23 mg/dL 14  7  9    Creatinine 0.40 - 1.20 mg/dL 9.22  9.10  9.36   BUN/Creat Ratio 9 - 23   14   Sodium 135 - 145 mEq/L 140  141  146   Potassium 3.5 - 5.1 mEq/L 3.8  3.5  3.9   Chloride 96 - 112 mEq/L 105  105  105   CO2 19 - 32 mEq/L 28  24  26    Calcium  8.4 - 10.5 mg/dL 9.2  9.8  9.6        Latest Ref Rng & Units 01/02/2024    8:44 PM 08/25/2023    3:17 PM 09/05/2022    6:24 PM  CBC  WBC 4.0 - 10.5 K/uL 9.5  7.0    Hemoglobin 12.0 - 15.0 g/dL 85.0  84.2  86.0   Hematocrit 36.0 - 46.0 % 44.9  48.4  41.0   Platelets 150 - 400 K/uL 320  317      Lab Results  Component Value Date   HGBA1C 7.0 (A) 08/09/2024   HGBA1C 6.8 (A) 01/14/2024   HGBA1C 6.5 (A) 10/21/2023     Past Medical History:  Diagnosis Date   Anal fissure    Breast mass    Bronchitis    Carpal tunnel syndrome, bilateral    Cholelithiasis    Depression    Diabetes mellitus    Type II   Essential hypertension    GERD (gastroesophageal reflux disease)    Herpes simplex type 1 infection    vaginal   Hypokalemia    2/2 diarrhea   Menorrhagia    Obesity    Polycystic ovarian disease    Postural dizziness with presyncope 11/29/2013   Renal calculi    Renal calculus    Sleep apnea    no cpap   Sleep apnea, obstructive     Current Outpatient Medications on File Prior to Visit  Medication Sig Dispense Refill   Accu-Chek Softclix Lancets lancets Test 4 (four) times daily. 100 each 12   albuterol  (VENTOLIN  HFA) 108 (90 Base) MCG/ACT  inhaler Inhale 1-2 puffs into the lungs every 6 (six) hours as needed (cough). 18 g 2   atorvastatin  (LIPITOR) 40 MG tablet Take 1 tablet (40 mg total) by mouth daily. 90 tablet 2   benzonatate  (TESSALON  PERLES) 100 MG capsule Take 2 capsules (200 mg total) by mouth 3 (three) times daily as needed for cough. (Patient not taking: Reported on 04/26/2024) 30 capsule 1   Continuous Glucose Receiver (DEXCOM G7 RECEIVER) DEVI Please use this receiver for continuous glucose monitoring if you are unable to use the SmartPhone app. 1 each 0   Continuous Glucose Sensor (DEXCOM G7 SENSOR) MISC Please attach to your arm. You can wear these for 10 days at a time. 9 each 3   dapagliflozin  propanediol (FARXIGA ) 10 MG TABS tablet Take 1 tablet (10 mg total) by mouth  daily. 30 tablet 11   diclofenac  Sodium (VOLTAREN ) 1 % GEL Apply 4 g topically 4 (four) times daily. (Patient not taking: Reported on 04/26/2024) 100 g 1   dicyclomine  (BENTYL ) 20 MG tablet Take 1 tablet (20 mg total) by mouth 3 (three) times daily as needed for spasms. 50 tablet 0   famotidine  (PEPCID ) 40 MG tablet Take 1 tablet (40 mg total) by mouth at bedtime. 90 tablet 0   FLUoxetine  (PROZAC ) 40 MG capsule Take 1 capsule (40 mg total) by mouth daily. 90 capsule 0   fluticasone  (FLONASE ) 50 MCG/ACT nasal spray Place 1 spray into both nostrils daily. 16 g 2   gabapentin  (NEURONTIN ) 300 MG capsule Take 1 capsule (300 mg total) by mouth 3 (three) times daily. 90 capsule 3   glucose blood (ACCU-CHEK GUIDE TEST) test strip Use to test blood glucose up to 4 times per day. 200 strip 12   Insulin  Glargine Solostar (LANTUS ) 100 UNIT/ML Solostar Pen Inject 32 Units into the skin 2 (two) times daily. 30 mL 3   Insulin  Pen Needle 32G X 6 MM MISC Please use to inject insulin  five times daily. 500 each 11   linaclotide  (LINZESS ) 145 MCG CAPS capsule Take 1 capsule (145 mcg total) by mouth daily before breakfast. Exp: 02-2025, lot: [id number] 8 capsule 0   linaclotide   (LINZESS ) 290 MCG CAPS capsule Take 1 capsule (290 mcg total) by mouth daily before breakfast. Exp: 05-2024, lot: [id number] 8 capsule 0   lipase/protease/amylase (CREON ) 36000 UNITS CPEP capsule Take 2 capsules before large meals, and 1 capsule with snacks (Patient not taking: Reported on 04/26/2024) 300 capsule 2   lisinopril  (ZESTRIL ) 10 MG tablet Take 1 tablet (10 mg total) by mouth daily. 90 tablet 3   Microlet Lancets MISC Use to check blood glucose up to 4 times per day. 200 each 12   Semaglutide , 2 MG/DOSE, (OZEMPIC , 2 MG/DOSE,) 8 MG/3ML SOPN Inject 2 mg into the skin once a week. 3 mL 3   No current facility-administered medications on file prior to visit.    Family History  Problem Relation Age of Onset   Hypertension Mother    Breast cancer Mother    Diabetes Father    Breast cancer Maternal Aunt    Colon cancer Neg Hx    Esophageal cancer Neg Hx    Stomach cancer Neg Hx    Rectal cancer Neg Hx     Social History   Socioeconomic History   Marital status: Married    Spouse name: Not on file   Number of children: 2   Years of education: Not on file   Highest education level: Not on file  Occupational History   Occupation: Deli    Employer: [employer]  Tobacco Use   Smoking status: Some Days    Current packs/day: 0.00    Types: Cigarettes    Last attempt to quit: 12/30/1978    Years since quitting: 45.6   Smokeless tobacco: Never  Vaping Use   Vaping status: Never Used  Substance and Sexual Activity   Alcohol use: Not Currently    Comment: occ.   Drug use: No   Sexual activity: Yes    Birth control/protection: Post-menopausal  Other Topics Concern   Not on file  Social History Narrative    Smoked many years ago drinks 1-2 beers on Friday and Saturdays no illegal drug use. Married and lives with her husband and sister-in-law son daughter and grandson.  Financial assistance approved for 100% discount at [facility] and has [facility] card per Barnie Potters as of August 06, 2010  6:27 PM   Social Drivers of Health   Financial Resource Strain: Not on file  Food Insecurity: No Food Insecurity (04/11/2022)   Hunger Vital Sign    Worried About Running Out of Food in the Last Year: Never true    Ran Out of Food in the Last Year: Never true  Transportation Needs: No Transportation Needs (04/11/2022)   PRAPARE - Administrator, Civil Service (Medical): No    Lack of Transportation (Non-Medical): No  Physical Activity: Not on file  Stress: Not on file  Social Connections: Not on file  Intimate Partner Violence: Not on file    Review of Systems: Review of Systems  Constitutional:  Positive for malaise/fatigue. Negative for chills and fever.  Eyes:  Negative for blurred vision, double vision and pain.  Respiratory:  Negative for shortness of breath.   Cardiovascular:  Negative for chest pain, palpitations and leg swelling.  Gastrointestinal:  Positive for constipation, diarrhea and heartburn. Negative for abdominal pain, blood in stool, melena, nausea and vomiting.  Genitourinary:  Negative for dysuria.  Musculoskeletal:  Negative for myalgias.  Neurological:  Positive for tingling (right foot). Negative for dizziness, weakness and headaches.  Psychiatric/Behavioral:  Positive for depression. Negative for hallucinations, substance abuse and suicidal ideas. The patient is not nervous/anxious and does not have insomnia.      Vitals:   08/09/24 1406  BP: 133/81  Pulse: 82  Temp: 98.8 F (37.1 C)  TempSrc: Oral  SpO2: 95%  Weight: 231 lb 9.6 oz (105.1 kg)  Height: 5' 6 (1.676 m)    Physical Exam: Physical Exam Cardiovascular:     Rate and Rhythm: Normal rate and regular rhythm.     Pulses: Normal pulses.     Heart sounds: Normal heart sounds.  Pulmonary:     Effort: Pulmonary effort is normal.     Breath sounds: Normal breath sounds.  Abdominal:     Palpations: Abdomen is soft.  Neurological:     Mental Status: She is alert.       Assessment & Plan:   Patient seen with Dr. Shawn Assessment & Plan Depression, unspecified depression type Patient's sister-in-law is in room and states that patient has had worsening mood, decreased activity, and more fatigue recently. Patient states that there are increased stressors around her life that are contributing to her worsening depression. Patient takes 40 mg of Prozac  daily, but last week increased to 80 mg daily herself hoping for improvement of symptoms. Patient has not noticed a change at this time. Patient denies SI/SH/HI. Patient denies any family history or personal history of mania (described to patient as feeling on top of the world, feeling like she did not need to sleep for periods of time, or periods of spending exorbitant amounts of money). Patient denies hallucinations. Patient also endorsed worsening fatigue and hair loss.  Plan:  -EKG performed in setting of self-increased Prozac  dosing. Normal Sinus rhythm. Qtc 392. -ambulatory referral to Behavioral Health with [facility]  -Continue Prozac  80 mg daily  -Wellbutrin  150 mg once daily  -TSH ordered Type 2 diabetes mellitus with peripheral neuropathy (HCC)  Lab Results  Component Value Date   HGBA1C 7.0 (A) 08/09/2024   HGBA1C 6.8 (A) 01/14/2024   HGBA1C 6.5 (A) 10/21/2023       Latest Ref Rng & Units 02/26/2024    2:56 PM  01/02/2024    8:44 PM 04/08/2023    4:29 PM  BMP  Glucose 70 - 99 mg/dL 885  783  73   BUN 6 - 23 mg/dL 14  7  9    Creatinine 0.40 - 1.20 mg/dL 9.22  9.10  9.36   BUN/Creat Ratio 9 - 23   14   Sodium 135 - 145 mEq/L 140  141  146   Potassium 3.5 - 5.1 mEq/L 3.8  3.5  3.9   Chloride 96 - 112 mEq/L 105  105  105   CO2 19 - 32 mEq/L 28  24  26    Calcium  8.4 - 10.5 mg/dL 9.2  9.8  9.6     Medications: Long-acting insulin  32 units twice daily; Continue Farxiga  10 mg; Ozempic  2 mg weekly Timing of Medications: Takes in AM (or midday, when she gets up), 9-9:30 at night; Takes Farxiga  at night,  Ozmepic takes every Monday (no constipation or diarrhea) Blood Sugars at home: Average glucose 164; when its higher she says she might miss a day of medication Diet and Exercise: dont eat as much as she used to. Has met with Mrs. Arland for nutrition; does not eat breakfast and lunch. For dinner will eat sandwiches, hotdog, hamberger. No exercise. Spoke about walking a couple times a week.  Diabetic Counseling: has met with donna previously, stated she does not feel she needs to meet with her again. Opthalmology: Patient reported evaluation in June/July Neuropathy: Gabapentin  300 mg TID. Right leg feels swollen/numb sometimes   Plan:  -microalbumin/Cr Ratio ordered -no changes to medication today with CC depression, likely contributing to patient's overall decrease in activity and difficulty adhering to medications -counseled patient on importance of exercise and diet with diabetes. Instructed patient to try and take daily walks and eat 3 small meals a day instead of 1 large meal at dinner  -Continue Insulin  32 units twice daily, Farxiga  10 mg, and Ozempic  2 mg weekly Primary hypertension Blood pressure today 133/81 -Continue lisinopril  10 mg  Hyperlipidemia, unspecified hyperlipidemia type Lipid Panel     Component Value Date/Time   CHOL 151 07/14/2023 1526   TRIG 67 07/14/2023 1526   HDL 68 07/14/2023 1526   CHOLHDL 2.2 07/14/2023 1526   CHOLHDL 3.3 07/11/2014 1510   VLDL 40 07/11/2014 1510   LDLCALC 70 07/14/2023 1526   LABVLDL 13 07/14/2023 1526  -lipid panel ordered in setting of diabetes and last lipids 1 year ago.  -continue atorvastatin  40 mg Breast cancer screening by mammogram -mammogram ordered Need for hepatitis C screening test -HCV Ab reflex ordered   Orders Placed This Encounter  Procedures   Glucose, capillary   POC Hbg A1C     Lamin Chandley, D.O. [facility], PGY-1 Date 08/09/2024 Time 2:29 PM

## 2024-08-10 ENCOUNTER — Ambulatory Visit: Payer: Self-pay

## 2024-08-10 ENCOUNTER — Other Ambulatory Visit (HOSPITAL_COMMUNITY): Payer: Self-pay

## 2024-08-10 DIAGNOSIS — E785 Hyperlipidemia, unspecified: Secondary | ICD-10-CM

## 2024-08-10 LAB — LIPID PANEL
Chol/HDL Ratio: 3.5 ratio (ref 0.0–4.4)
Cholesterol, Total: 196 mg/dL (ref 100–199)
HDL: 56 mg/dL (ref >39)
LDL Chol Calc (NIH): 121 mg/dL — ABNORMAL HIGH (ref 0–99)
Triglycerides: 109 mg/dL (ref 0–149)
VLDL Cholesterol Cal: 19 mg/dL (ref 5–40)

## 2024-08-10 LAB — MICROALBUMIN / CREATININE URINE RATIO
Creatinine, Urine: 90.6 mg/dL
Microalb/Creat Ratio: 7 mg/g{creat} (ref 0–29)
Microalbumin, Urine: 6.3 ug/mL

## 2024-08-10 LAB — HCV AB W REFLEX TO QUANT PCR: HCV Ab: NONREACTIVE

## 2024-08-10 LAB — HCV INTERPRETATION

## 2024-08-10 LAB — TSH: TSH: 0.866 u[IU]/mL (ref 0.450–4.500)

## 2024-08-10 MED ORDER — FLUOXETINE HCL 40 MG PO CAPS
80.0000 mg | ORAL_CAPSULE | Freq: Every day | ORAL | 5 refills | Status: AC
  Filled 2024-08-10 – 2025-01-03 (×15): qty 120, 60d supply, fill #0
  Filled 2025-01-26: qty 120, 60d supply, fill #1

## 2024-08-10 MED ORDER — BUPROPION HCL ER (XL) 150 MG PO TB24
150.0000 mg | ORAL_TABLET | Freq: Every day | ORAL | 0 refills | Status: DC
  Filled 2024-08-10 (×2): qty 30, 30d supply, fill #0

## 2024-08-10 NOTE — Progress Notes (Signed)
 Internal Medicine Clinic Attending  I was physically present during the key portions of the resident provided service and participated in the medical decision making of patient's management care. I reviewed pertinent patient test results.  The assessment, diagnosis, and plan were formulated together and I agree with the documentation in the resident's note.  Severe depression, elevated PHQ9 score despite taking Prozac  80 mg every day. Has worked well for her in the past so will not change. Will add Wellbutrin  150 mg. No history of seizures, advised to not take more medications than prescribed.

## 2024-08-11 ENCOUNTER — Other Ambulatory Visit (HOSPITAL_COMMUNITY): Payer: Self-pay

## 2024-08-12 ENCOUNTER — Other Ambulatory Visit (HOSPITAL_COMMUNITY): Payer: Self-pay

## 2024-08-12 ENCOUNTER — Other Ambulatory Visit: Payer: Self-pay | Admitting: Physician Assistant

## 2024-08-12 DIAGNOSIS — K8681 Exocrine pancreatic insufficiency: Secondary | ICD-10-CM

## 2024-08-12 DIAGNOSIS — K529 Noninfective gastroenteritis and colitis, unspecified: Secondary | ICD-10-CM

## 2024-08-12 MED ORDER — DICYCLOMINE HCL 20 MG PO TABS
20.0000 mg | ORAL_TABLET | Freq: Three times a day (TID) | ORAL | 0 refills | Status: DC | PRN
  Filled 2024-08-30: qty 50, 17d supply, fill #0

## 2024-08-13 ENCOUNTER — Other Ambulatory Visit: Payer: Self-pay

## 2024-08-13 ENCOUNTER — Other Ambulatory Visit (HOSPITAL_COMMUNITY): Payer: Self-pay

## 2024-08-13 DIAGNOSIS — E7800 Pure hypercholesterolemia, unspecified: Secondary | ICD-10-CM

## 2024-08-16 ENCOUNTER — Other Ambulatory Visit (HOSPITAL_COMMUNITY): Payer: Self-pay

## 2024-08-16 ENCOUNTER — Encounter (HOSPITAL_COMMUNITY): Payer: Self-pay

## 2024-08-16 ENCOUNTER — Telehealth: Payer: Self-pay

## 2024-08-16 NOTE — Telephone Encounter (Signed)
 Prior Authorization for patient (Dexcom G7 Sensor) came through on cover my meds was submitted with last office notes and labs awaiting approval or denial.  XZB:[id number]

## 2024-08-16 NOTE — Telephone Encounter (Signed)
 [facility] (Key: [id number]) PA Case ID #: [id number] Rx #: [id number] Need Help? Call us  at [phone] Outcome Approved today by [facility] 2017 NCPDP Request Reference Number: [id number]. DEXCOM G7 MIS SENSOR is approved through 08/16/2025. For further questions, call [employer] at [phone]. Effective Date: 08/16/2024 Authorization Expiration Date: 08/16/2025 Drug Dexcom G7 Sensor ePA cloud logo Form [employer] Electronic Prior Authorization Form [phone] NCPDP) Original Claim Info 30

## 2024-08-17 ENCOUNTER — Other Ambulatory Visit (HOSPITAL_COMMUNITY): Payer: Self-pay

## 2024-08-17 ENCOUNTER — Ambulatory Visit

## 2024-08-18 ENCOUNTER — Other Ambulatory Visit (HOSPITAL_COMMUNITY): Payer: Self-pay

## 2024-08-19 ENCOUNTER — Other Ambulatory Visit (HOSPITAL_COMMUNITY): Payer: Self-pay

## 2024-08-20 ENCOUNTER — Other Ambulatory Visit (HOSPITAL_COMMUNITY): Payer: Self-pay

## 2024-08-23 ENCOUNTER — Other Ambulatory Visit (HOSPITAL_COMMUNITY): Payer: Self-pay

## 2024-08-24 ENCOUNTER — Ambulatory Visit

## 2024-08-30 ENCOUNTER — Other Ambulatory Visit: Payer: Self-pay

## 2024-08-30 ENCOUNTER — Other Ambulatory Visit: Payer: Self-pay | Admitting: Student

## 2024-08-30 ENCOUNTER — Other Ambulatory Visit (HOSPITAL_COMMUNITY): Payer: Self-pay

## 2024-08-30 DIAGNOSIS — F32A Depression, unspecified: Secondary | ICD-10-CM

## 2024-08-31 ENCOUNTER — Other Ambulatory Visit (HOSPITAL_COMMUNITY): Payer: Self-pay

## 2024-08-31 ENCOUNTER — Ambulatory Visit (INDEPENDENT_AMBULATORY_CARE_PROVIDER_SITE_OTHER): Payer: Self-pay | Admitting: Licensed Clinical Social Worker

## 2024-08-31 ENCOUNTER — Telehealth: Payer: Self-pay

## 2024-08-31 DIAGNOSIS — Z636 Dependent relative needing care at home: Secondary | ICD-10-CM

## 2024-08-31 MED FILL — Bupropion HCl Tab ER 24HR 150 MG: ORAL | 30 days supply | Qty: 30 | Fill #0 | Status: CN

## 2024-08-31 NOTE — BH Specialist Note (Signed)
 Integrated Behavioral Health Initial In-Person Visit  MRN: [id number] Name: Amy Norris  Number of Integrated Behavioral Health Clinician visits: No data recorded Session Start time: 1430    Session End time: 1500  Total time in minutes: 30    Types of Service: Introduction only  Interpretor:No. Interpretor Name and Language: N/A   Subjective: Amy Norris is a 58 y.o. female  Patient was referred by PCP for Counseling. Patient reports the following symptoms/concerns: The Licensed Clinical Engineer, building services (LCSW-A), acting as a Visual merchandiser [facility]), initiated a session with patient. The [facility] introduced themselves, explained her role, and provided contact information to the patient. Confidentiality and mandated reporting were discussed, and the patient denied any suicidal ideations or intent to harm others. 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  Duration of problem: More than a year; Severity of problem: mild  Objective: Mood: NA and Affect: Appropriate Risk of harm to self or others: No plan to harm self or others   Clinical Assessment/Diagnosis  Caregiver stress   Assessment: Patient currently experiencing Caregiver stress.   Patient may benefit from Counseling .  Plan: Follow up with behavioral health clinician on : 09/11 following PCP appointment  Renda Pontes, MSW, LCSW-A She/Her Behavioral Health Clinician [facility]

## 2024-08-31 NOTE — Telephone Encounter (Signed)
 [name] (Key: [id number]) Rx #: [id number] Ozempic  (2 MG/DOSE) 8MG JONELLE pen-injectors Form [employer] Electronic Prior Authorization Form (2017 NCPDP) Created Sent to Plan Plan Response Submit Clinical Questions Determination Favorable Message from Plan Request Reference Number: [id number]. OZEMPIC  INJ 8MG /3ML is approved through 08/31/2025. For further questions, call [employer] at [phone].[name] Authorization Expiration Date: August 31, 2025.

## 2024-08-31 NOTE — Telephone Encounter (Signed)
 Prior Authorization for patient (Ozempic  (2 MG/DOSE) 8MG /3ML pen-injectors) came through on cover my meds was submitted with last office notes and labs awaiting approval or denial.  KEY:[id number]

## 2024-09-01 ENCOUNTER — Other Ambulatory Visit: Payer: Self-pay

## 2024-09-01 ENCOUNTER — Other Ambulatory Visit (HOSPITAL_COMMUNITY): Payer: Self-pay

## 2024-09-03 ENCOUNTER — Other Ambulatory Visit (HOSPITAL_COMMUNITY): Payer: Self-pay

## 2024-09-03 ENCOUNTER — Other Ambulatory Visit: Payer: Self-pay | Admitting: Student

## 2024-09-03 ENCOUNTER — Other Ambulatory Visit: Payer: Self-pay

## 2024-09-03 ENCOUNTER — Other Ambulatory Visit: Payer: Self-pay | Admitting: Internal Medicine

## 2024-09-03 DIAGNOSIS — F32A Depression, unspecified: Secondary | ICD-10-CM

## 2024-09-03 MED ORDER — BENZONATATE 100 MG PO CAPS
200.0000 mg | ORAL_CAPSULE | Freq: Three times a day (TID) | ORAL | 1 refills | Status: DC | PRN
  Filled 2024-09-03: qty 30, 5d supply, fill #0
  Filled 2024-10-24 – 2024-11-30 (×2): qty 30, 5d supply, fill #1

## 2024-09-05 ENCOUNTER — Other Ambulatory Visit (HOSPITAL_COMMUNITY): Payer: Self-pay

## 2024-09-06 ENCOUNTER — Encounter (HOSPITAL_COMMUNITY): Payer: Self-pay

## 2024-09-06 ENCOUNTER — Other Ambulatory Visit (HOSPITAL_COMMUNITY): Payer: Self-pay

## 2024-09-07 ENCOUNTER — Ambulatory Visit
Admission: RE | Admit: 2024-09-07 | Discharge: 2024-09-07 | Disposition: A | Discharge status: Home / Self Care | Source: Ambulatory Visit

## 2024-09-07 DIAGNOSIS — Z1231 Encounter for screening mammogram for malignant neoplasm of breast: Secondary | ICD-10-CM

## 2024-09-09 ENCOUNTER — Other Ambulatory Visit: Payer: Self-pay

## 2024-09-09 ENCOUNTER — Ambulatory Visit

## 2024-09-09 ENCOUNTER — Ambulatory Visit: Admitting: Licensed Clinical Social Worker

## 2024-09-09 ENCOUNTER — Other Ambulatory Visit (HOSPITAL_COMMUNITY): Payer: Self-pay

## 2024-09-09 VITALS — BP 122/68 | HR 70 | Temp 98.2°F | Ht 66.0 in | Wt 228.0 lb

## 2024-09-09 DIAGNOSIS — F17210 Nicotine dependence, cigarettes, uncomplicated: Secondary | ICD-10-CM | POA: Diagnosis not present

## 2024-09-09 DIAGNOSIS — R590 Localized enlarged lymph nodes: Secondary | ICD-10-CM | POA: Diagnosis not present

## 2024-09-09 DIAGNOSIS — Z833 Family history of diabetes mellitus: Secondary | ICD-10-CM | POA: Diagnosis not present

## 2024-09-09 DIAGNOSIS — E1142 Type 2 diabetes mellitus with diabetic polyneuropathy: Secondary | ICD-10-CM

## 2024-09-09 DIAGNOSIS — Z794 Long term (current) use of insulin: Secondary | ICD-10-CM | POA: Diagnosis not present

## 2024-09-09 DIAGNOSIS — E7800 Pure hypercholesterolemia, unspecified: Secondary | ICD-10-CM | POA: Diagnosis not present

## 2024-09-09 DIAGNOSIS — Z636 Dependent relative needing care at home: Secondary | ICD-10-CM

## 2024-09-09 DIAGNOSIS — Z7985 Long-term (current) use of injectable non-insulin antidiabetic drugs: Secondary | ICD-10-CM | POA: Diagnosis not present

## 2024-09-09 DIAGNOSIS — F32A Depression, unspecified: Secondary | ICD-10-CM

## 2024-09-09 DIAGNOSIS — Z23 Encounter for immunization: Secondary | ICD-10-CM | POA: Diagnosis not present

## 2024-09-09 NOTE — Patient Instructions (Signed)
 Thank you, Amy Norris for allowing us  to provide your care today. Today we discussed your diabetes, cholesterol, and depression. I am glad you're doing well!     Please follow-up in: 2 months to recheck your A1c (November 11th or after)    We look forward to seeing you next time. Please call our clinic at [phone] if you have any questions or concerns. The best time to call is Monday-Friday from 9am-4pm, but there is someone available 24/7. If after hours or the weekend, call the main hospital number and ask for the Internal Medicine Resident On-Call. If you need medication refills, please notify your pharmacy one week in advance and they will send us  a request.   Thank you for letting us  take part in your care. Wishing you the best!  Khylee Algeo, DO 09/09/2024, 2:58 PM [facility]

## 2024-09-09 NOTE — Progress Notes (Signed)
 Established Patient Office Visit  Subjective   Patient ID: Amy Norris, female    DOB: July 07, 1966  Age: 58 y.o. MRN: [id number]  Chief Complaint  Patient presents with   Follow-up   knot on neck   Back Pain    Amy Norris is a 58 year old female with a pertinent past medical history of type 2 diabetes mellitus complicated by polyneuropathy, HLD, hypertension, OSA, and depression who presents to the clinic today for a follow-up on her diabetes, cholesterol, depression, and new back pain.  Please see problem-based assessment and plan for details below.   Review of Systems  Constitutional:  Negative for chills, fever and weight loss.  Eyes: Negative.   Respiratory: Negative.    Cardiovascular: Negative.   Gastrointestinal: Negative.   Genitourinary: Negative.   Musculoskeletal:  Positive for back pain.       Left sided, new, sharp shooting with sudden movements.   Skin: Negative.   Neurological: Negative.   Endo/Heme/Allergies: Negative.   Psychiatric/Behavioral: Negative.        Objective:    BP 122/68 (BP Location: Right Arm, Patient Position: Sitting, Cuff Size: Small)   Pulse 70   Temp 98.2 F (36.8 C) (Oral)   Ht 5' 6 (1.676 m)   Wt 228 lb (103.4 kg)   LMP 10/02/2020 (Approximate)   SpO2 98%   BMI 36.80 kg/m  Physical Exam Constitutional:      Appearance: Normal appearance.     Comments: Pleasant  HENT:     Head: Normocephalic.     Mouth/Throat:     Mouth: Mucous membranes are moist.  Eyes:     Pupils: Pupils are equal, round, and reactive to light.  Neck:     Comments: Right sided superficial lymph node, 1cm in size, mobile and nontender Cardiovascular:     Rate and Rhythm: Normal rate and regular rhythm.     Pulses: Normal pulses.     Heart sounds: Normal heart sounds.  Pulmonary:     Effort: Pulmonary effort is normal.     Breath sounds: Normal breath sounds.  Abdominal:     General: Abdomen is flat.     Palpations: Abdomen is soft.   Musculoskeletal:        General: Normal range of motion.     Cervical back: Normal range of motion.  Lymphadenopathy:     Cervical: Cervical adenopathy present.     Right cervical: Superficial cervical adenopathy present.  Skin:    General: Skin is warm.  Neurological:     General: No focal deficit present.     Mental Status: She is alert and oriented to person, place, and time.  Psychiatric:        Mood and Affect: Mood normal.        Behavior: Behavior normal.       The 10-year ASCVD risk score (Arnett DK, et al., 2019) is: 13.2%   Assessment & Plan:   Patient seen with Dr. Jone Dauphin.  Depression, unspecified type At last visit on 8/11, patient's sister-in-law was present and reported the patient had had worsening mood, decreased activity, and increased fatigue.  This was attributed to patient's increase stressors in her life that were worsening her depression. TSH done at that time within normal limits. Patient initially was on Prozac  40mg  daily, but had self increased her dose to 80mg  daily to improve her symptoms.  She was prescribed Wellbutrin  150mg  at that time.  Today, patient and patient's husband believe that  this medicine is not doing very much for her mood.  Additionally, her insurance has denied a prior authorization for Wellbutrin , and they would like to stop the medicine today.  She has lowered her Prozac  back to 40 mg since the day of the last visit.  She reports that her mood has been stable, and has not worsened or drastically improved on both the medications.  Discussed with patient to continue monitoring how she feels, and if she notices any worsening of her symptoms that we can supplement with another medication or increase her dose of Prozac .  Patient voiced understanding.  Will stop Wellbutrin  150 mg daily, and continue Prozac  40 mg. - Stop Wellbutrin  150mg  daily - Continue Prozac  40 mg daily  Type 2 Diabetes Mellitus Patient is taking insulin  32 units twice  daily, Farxiga  10 mg daily, and Ozempic  2 mg weekly.  Her last injection of Ozempic  was 08/23/2024.  She states that she typically picks up her medications on the first of the month, and Ozempic  needed a prior authorization to be approved at that time.  Per chart check, Ozempic  2 mg weekly was approved on 08/31/2024, but patient has not been able to pick up this medicine from the pharmacy yet.  Reports adhering to insulin  and Farxiga  well.  ACR ordered last visit and within normal limits.  Patient tends to eat 1 large meal at night and will forego breakfast or lunch, however has been trying to implement more small meals throughout the day. Discussed the importance of eating 3 small meals a day to help with insulin  regulation.  Patient reports no hypoglycemic events, with her lows being anywhere between 65-70 and her highs being under 300.  A1c to be rechecked in 2 months. - Continue insulin  32 units twice daily, Farxiga  10 mg daily, Ozempic  2 mg weekly. - Recheck A1c in 10/2024  Hyperlipidemia Lipid panel checked on 8 over 11/25 shows total cholesterol 196 and LDL of 121.  Patient on atorvastatin  40 mg, but admits that she sometimes will forget to take this medicine.  Since her last visit she has been implementing a strategy to take her medicines at night so that way she can remember to take them daily.  This has been working for her, and I am less inclined to increase her atorvastatin  dose today.  Will monitor cholesterol, and if LDL is still elevated on next lipid profile, consider increasing atorvastatin  to 80 mg daily. - Continue atorvastatin  40mg  daily   Lymph Node on Neck Patient reports new knot on the right side of her neck that has been present for about 2 weeks.  It has not changed in size, is nontender, and is mobile.  She denies recent illness or any sick contacts.  She denies any sort of compressive symptoms.  Given lymph node presentation and lack of symptoms to suggest worsening, reassured  patient this is self resolving.  Discussed with her that if she notices the lymph node increasing in size or development of new lymph nodes within the same area to follow-up to be seen for further management.  Patient voiced understanding.    Kateleen Encarnacion, DO Internal Medicine Resident, PGY-1 3:22 PM 09/09/2024

## 2024-09-09 NOTE — Addendum Note (Signed)
 Addended by: CAMMIE GAETANA DEL on: 09/09/2024 03:49 PM   Modules accepted: Orders
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
## 2024-09-10 ENCOUNTER — Other Ambulatory Visit (HOSPITAL_COMMUNITY): Payer: Self-pay

## 2024-09-10 ENCOUNTER — Other Ambulatory Visit: Payer: Self-pay | Admitting: Physician Assistant

## 2024-09-10 DIAGNOSIS — K529 Noninfective gastroenteritis and colitis, unspecified: Secondary | ICD-10-CM

## 2024-09-10 DIAGNOSIS — K8681 Exocrine pancreatic insufficiency: Secondary | ICD-10-CM

## 2024-09-10 MED ORDER — DICYCLOMINE HCL 20 MG PO TABS
20.0000 mg | ORAL_TABLET | Freq: Three times a day (TID) | ORAL | 2 refills | Status: AC | PRN
  Filled 2024-11-30: qty 50, 17d supply, fill #0
  Filled 2025-01-03: qty 50, 17d supply, fill #1
  Filled 2025-01-26: qty 50, 17d supply, fill #2

## 2024-09-10 NOTE — Progress Notes (Signed)
 Internal Medicine Clinic Attending  I was physically present during the key portions of the resident provided service and participated in the medical decision making of patient's management care. I reviewed pertinent patient test results.  The assessment, diagnosis, and plan were formulated together and I agree with the documentation in the resident's note.  Cervical LAD - x2 weeks, likely reactive, unclear etiology. If persistent, will obtain US .   Shawn Sick, MD

## 2024-09-13 ENCOUNTER — Other Ambulatory Visit: Payer: Self-pay | Admitting: Student

## 2024-09-13 ENCOUNTER — Other Ambulatory Visit (HOSPITAL_COMMUNITY): Payer: Self-pay

## 2024-09-13 DIAGNOSIS — E1142 Type 2 diabetes mellitus with diabetic polyneuropathy: Secondary | ICD-10-CM

## 2024-09-13 MED ORDER — INSULIN GLARGINE SOLOSTAR 100 UNIT/ML ~~LOC~~ SOPN
32.0000 [IU] | PEN_INJECTOR | Freq: Two times a day (BID) | SUBCUTANEOUS | 3 refills | Status: DC
  Filled 2024-09-13 – 2024-10-06 (×3): qty 30, 47d supply, fill #0

## 2024-09-15 ENCOUNTER — Other Ambulatory Visit (HOSPITAL_COMMUNITY): Payer: Self-pay

## 2024-09-23 ENCOUNTER — Other Ambulatory Visit (HOSPITAL_COMMUNITY): Payer: Self-pay

## 2024-09-24 ENCOUNTER — Other Ambulatory Visit (HOSPITAL_COMMUNITY): Payer: Self-pay

## 2024-09-24 ENCOUNTER — Other Ambulatory Visit: Payer: Self-pay | Admitting: Student

## 2024-09-24 ENCOUNTER — Other Ambulatory Visit: Payer: Self-pay

## 2024-09-24 MED ORDER — FLUTICASONE PROPIONATE 50 MCG/ACT NA SUSP
1.0000 | Freq: Every day | NASAL | 2 refills | Status: AC
  Filled 2024-09-24 – 2024-12-29 (×9): qty 16, 30d supply, fill #0
  Filled 2025-01-28: qty 16, 30d supply, fill #1

## 2024-10-05 ENCOUNTER — Other Ambulatory Visit (HOSPITAL_COMMUNITY): Payer: Self-pay

## 2024-10-06 ENCOUNTER — Other Ambulatory Visit: Payer: Self-pay

## 2024-10-06 ENCOUNTER — Other Ambulatory Visit (HOSPITAL_COMMUNITY): Payer: Self-pay

## 2024-10-14 ENCOUNTER — Other Ambulatory Visit (HOSPITAL_COMMUNITY): Payer: Self-pay

## 2024-10-14 ENCOUNTER — Ambulatory Visit: Admitting: Licensed Clinical Social Worker

## 2024-10-14 DIAGNOSIS — F3340 Major depressive disorder, recurrent, in remission, unspecified: Secondary | ICD-10-CM | POA: Diagnosis present

## 2024-10-14 NOTE — BH Specialist Note (Signed)
 Integrated Behavioral Health Follow Up In-Person Visit  MRN: [id number] Name: Amy Norris  Number of Integrated Behavioral Health Clinician visits: Additional Visit  Session Start time: 1330   Session End time: 1430  Total time in minutes: 60    Types of Service: Individual psychotherapy and General Behavioral Integrated Care (BHI)  Interpretor:No. Interpretor Name and Language: N/A  Subjective: Amy Norris is a 58 y.o. female  Patient was referred by PCP for Counseling. Patient reports the following symptoms/concerns: Patient reported caregiver stress and financial hardship. [facility] educated patient on financial assisting programs; housing authority, FNS benefits, CAP services. Patient agreed to all except housing. Patient discussed ongoing stress and wanting to travel. [facility] reviewed Caregiver burnout symptoms and discuss the importance of self care. Patient said she wanted to visit her mother in [place] . [facility] and patient spent time discussing ways to collaborate with family to purchase a bus or train ticket. Patient agreed to contact family, attempt to save money to purchase ticket. Overall patient reports doing fine.  Duration of problem: More than a year; Severity of problem: moderate  Objective: Mood: NA and Affect: Appropriate Risk of harm to self or others: No plan to harm self or others  Life Context: Family and Social: Patient reports a significant extended family and she is married. 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  Patient and/or Family's Strengths/Protective Factors: Sense of purpose  Goals Addressed: Patient will:  Reduce symptoms of: depression   Progress towards Goals: Ongoing  Interventions: Interventions utilized:  CBT Cognitive Behavioral Therapy Standardized Assessments completed: Patient declined screening      Patient and/or Family Response: Patient agrees to continued  services  Patient Centered Plan: Patient is on the following Treatment Plan(s): Monthly sessions  Clinical Assessment/Diagnosis  Depression, major, recurrent, in remission    Assessment: Patient currently experiencing Depression.   Patient may benefit from Ongoing OPT.  Plan: Follow up with behavioral health clinician on : 11/06 at 1:30 pm  Renda Pontes, MSW, LCSW-A She/Her Behavioral Health Clinician [facility]  Internal Medicine Center

## 2024-10-18 ENCOUNTER — Other Ambulatory Visit (HOSPITAL_COMMUNITY): Payer: Self-pay

## 2024-10-19 ENCOUNTER — Other Ambulatory Visit (HOSPITAL_COMMUNITY): Payer: Self-pay

## 2024-10-19 ENCOUNTER — Other Ambulatory Visit: Payer: Self-pay

## 2024-10-22 ENCOUNTER — Other Ambulatory Visit (HOSPITAL_COMMUNITY): Payer: Self-pay

## 2024-10-25 ENCOUNTER — Other Ambulatory Visit: Payer: Self-pay

## 2024-10-25 ENCOUNTER — Other Ambulatory Visit (HOSPITAL_COMMUNITY): Payer: Self-pay

## 2024-11-01 ENCOUNTER — Encounter: Payer: Self-pay | Admitting: Radiology

## 2024-11-01 ENCOUNTER — Other Ambulatory Visit (HOSPITAL_COMMUNITY): Payer: Self-pay

## 2024-11-02 ENCOUNTER — Other Ambulatory Visit (HOSPITAL_COMMUNITY): Payer: Self-pay

## 2024-11-02 ENCOUNTER — Other Ambulatory Visit: Payer: Self-pay

## 2024-11-04 ENCOUNTER — Ambulatory Visit: Admitting: Licensed Clinical Social Worker

## 2024-11-09 NOTE — Progress Notes (Unsigned)
 Patient name: Amy Norris Date of birth: October 30, 1966 Date of visit: 11/10/24  Type of visit: Established Patient Office Visit  Subjective   Chief concern:  Chief Complaint  Patient presents with   Follow-up   Vaginal Itching    Pt reports vaginal itching for about a week now ... She has tried the OTC 3 day  treatment  it  is still itching and red  with burning at times     Amy Norris is a 58 y.o. female with a PMHx of T2DM with peripheral neuropathy, MDD, exocrine pancreatic insufficiency, OA, HTN, HLD who presents to [facility] clinic for follow up of diabetes/A1c recheck.  The patient also endorses vaginal burning, itching, urinary frequency for the past week/week and a half.  Her symptoms are constant but intermittently get worse throughout the day.  Overall symptom severity has been constant.  She has tried 3 days of Monistat which has only mildly relieved her symptoms.  Associated symptoms include urinary urgency.  Denies vaginal discharge or bleeding.  She is not sexually active.  The last UTI or yeast infection she had was several years ago.  Other pertinent negatives include abdominal pain, nausea, vomiting, hematuria, urinary incontinence, urinary retention.  Patient Active Problem List   Diagnosis Date Noted   Depression, major, recurrent, in remission 11/10/2024   Urinary frequency 11/10/2024   Chronic diarrhea 12/17/2023   Mononeuropathy 10/21/2023   Chronic cough 04/29/2022   Breast lump on right side at 1 o'clock position 05/16/2020   Exocrine pancreatic insufficiency 03/09/2020   Pulmonary nodule 02/12/2019   Osteoarthritis 05/12/2018   Diabetic polyneuropathy (HCC) 02/02/2017   Acute bronchitis 11/23/2014   Trigger finger 02/04/2014   Dizziness 11/29/2013   Vaginal itching 09/10/2013   Hypercholesteremia 11/05/2012   Hypertension 01/14/2008   Type 2 diabetes mellitus with peripheral neuropathy (HCC) 12/04/2006   Morbid obesity (HCC) 12/04/2006   Major  depressive disorder 12/04/2006   SLEEP APNEA, OBSTRUCTIVE 12/04/2006   Carpal tunnel syndrome 12/04/2006   GERD 12/04/2006     Past Surgical History:  Procedure Laterality Date   CHOLECYSTECTOMY  10/31/2007   HAND SURGERY  11/25/2023   [facility] [address]   TUBAL LIGATION  12/30/1989   1991    ROS negative unless otherwise indicated in the HPI or Assessment and Plan.  Current Outpatient Medications  Medication Instructions   Accu-Chek Softclix Lancets lancets Test 4 (four) times daily.   albuterol  (VENTOLIN  HFA) 108 (90 Base) MCG/ACT inhaler 1-2 puffs, Inhalation, Every 6 hours PRN   atorvastatin  (LIPITOR) 40 mg, Oral, Daily   benzonatate  (TESSALON  PERLES) 200 mg, Oral, 3 times daily PRN   buPROPion  (WELLBUTRIN  XL) 150 mg, Oral, Daily   Continuous Glucose Receiver (DEXCOM G7 RECEIVER) DEVI Please use this receiver for continuous glucose monitoring if you are unable to use the SmartPhone app.   Continuous Glucose Sensor (DEXCOM G7 SENSOR) MISC Please attach to your arm for continuous glucose monitoring. You can wear these for 10 days at a time and then replace with a new sensor.   diclofenac  Sodium (VOLTAREN ) 4 g, Topical, 4 times daily   dicyclomine  (BENTYL ) 20 mg, Oral, 3 times daily PRN   famotidine  (PEPCID ) 40 mg, Oral, Daily at bedtime   Farxiga  10 mg, Oral, Daily   FLUoxetine  (PROZAC ) 80 mg, Oral, Daily   fluticasone  (FLONASE ) 50 MCG/ACT nasal spray 1 spray, Each Nare, Daily   gabapentin  (NEURONTIN ) 300 mg, Oral, 3 times daily   glucose blood (  ACCU-CHEK GUIDE TEST) test strip Use to test blood glucose up to 4 times per day.   Insulin  Glargine Solostar (LANTUS ) 100 UNIT/ML Solostar Pen Inject 36 Units into the skin in the morning and 32 Units into the skin at night.   Insulin  Pen Needle 32G X 6 MM MISC Please use to inject insulin  five times daily.   linaclotide  (LINZESS ) 145 mcg, Oral, Daily before breakfast, Exp: 02-2025, lot: [id number]   linaclotide  (LINZESS )  290 mcg, Oral, Daily before breakfast, Exp: 05-2024, lot: [id number]   lipase/protease/amylase (CREON ) 36000 UNITS CPEP capsule Take 2 capsules before large meals, and 1 capsule with snacks   lisinopril  (ZESTRIL ) 10 mg, Oral, Daily   Microlet Lancets MISC Use to check blood glucose up to 4 times per day.   Ozempic  (2 MG/DOSE) 2 mg, Subcutaneous, Weekly    Social History   Tobacco Use   Smoking status: Some Days    Current packs/day: 0.00    Types: Cigarettes    Last attempt to quit: 12/30/1978    Years since quitting: 45.8   Smokeless tobacco: Never  Vaping Use   Vaping status: Never Used  Substance Use Topics   Alcohol use: Not Currently    Comment: occ.   Drug use: No      Objective  Today's Vitals   11/10/24 1332  BP: 122/61  Pulse: 68  Temp: 97.8 F (36.6 C)  TempSrc: Oral  SpO2: 96%  Weight: 235 lb 3.2 oz (106.7 kg)  Height: 5' 7 (1.702 m)  Body mass index is 36.84 kg/m.   Physical Exam: Constitutional: well-appearing; obese; no acute distress HENT: normocephalic atraumatic, mucous membranes moist Eyes: conjunctiva non-erythematous Cardiovascular: regular rate and rhythm, no m/r/g Pulmonary/Chest: normal work of breathing on room air, lungs CTAB Abdominal: soft, non-tender, non-distended; no lower abdominal fullness/tenderness upon palpation.  Costovertebral angles are not tender to percussion. MSK: normal bulk and tone Neurological: alert & oriented x 3, no focal deficit Skin: warm and dry Extremities: BLE without edema or erythema. Psych: normal mood and behavior  Last hemoglobin A1c Lab Results  Component Value Date   HGBA1C 7.2 (A) 11/10/2024      Assessment & Plan  Type 2 diabetes mellitus with peripheral neuropathy (HCC) Assessment & Plan: A1c today is 7.2%. Last A1c is 7.0% on 08/09/2024. Current regimen includes Lantus  32u bid, Farxiga  10mg  daily, Ozempic  2mg  weekly but haven't had since 4-6 weeks ago. Ozempic  is waiting at the pharmacy but patient  has not had the money to afford the copay. Gabapentin  300mg  tid for neuropathy. Glucose log from Dexcom showing average glucose of 227 and 34% above goal. No hypoglycemic events. Blood glucoses are mostly elevated during day/evening.  Suspect that patient's A1c has increased due to stopping Ozempic . - increase Lantus  morning dose to 36u and keep evening dose of 32u - will discontinue Ozempic  from patient's medication list - if patient is able to afford ozempic  she will need to be started at lowest dose - Continue Farxiga  10 mg daily - Continue gabapentin  300 mg 3 times daily for neuropathy  Orders: -     POCT glycosylated hemoglobin (Hb A1C) -     Glucose, capillary -     Insulin  Glargine Solostar; Inject 36 Units into the skin in the morning and 32 Units into the skin at night.  Dispense: 30 mL; Refill: 3  Hypertension, unspecified type Assessment & Plan: BP today is 122/61.  Current regimen includes lisinopril  10 mg daily.  No changes  necessary.  Will continue current regimen.   Recurrent major depressive disorder, in partial remission  Urinary frequency Assessment & Plan: Patient does endorse urinary frequency and urinary urgency for the past few weeks.  No flank pain.  She does take Farxiga  for her diabetes which may increase risk of UTI/yeast infection.  No lower abdominal tenderness/fullness on exam.  Flanks not tender to palpation.  Plan to check UA with reflex to culture and send antibiotics if positive. - UA with reflex to culture pending  Orders: -     UA/M w/rflx Culture, Routine  Vaginal itching Assessment & Plan: Patient's week long vaginal itching/burning sensation in context of abstention from sexual activity concerning for yeast infection.  Symptoms have mildly improved with Monistat.  Will hold off on empirically treating symptoms in favor of waiting for self swab Aptima testing for GC/chlamydia/BV/Candida.  Counseled the patient on over-the-counter boric acid suppository  if your symptoms are worsening. - Aptima self swab pending - OTC boric acid suppository as needed  Orders: -     Cervicovaginal ancillary only    Return in about 3 months (around 02/10/2025), or if symptoms worsen or fail to improve, for T2DM/A1c followup.   Patient case discussed with Dr. Shawn, who also saw and evaluated the patient.  Zaynab Chipman, MD [facility]  PGY-1 11/10/2024, 5:25 PM

## 2024-11-10 ENCOUNTER — Ambulatory Visit (INDEPENDENT_AMBULATORY_CARE_PROVIDER_SITE_OTHER)

## 2024-11-10 ENCOUNTER — Other Ambulatory Visit (HOSPITAL_COMMUNITY)
Admission: RE | Admit: 2024-11-10 | Discharge: 2024-11-10 | Disposition: A | Discharge status: Home / Self Care | Source: Ambulatory Visit | Attending: Internal Medicine | Admitting: Internal Medicine

## 2024-11-10 ENCOUNTER — Other Ambulatory Visit: Payer: Self-pay

## 2024-11-10 ENCOUNTER — Other Ambulatory Visit (HOSPITAL_COMMUNITY): Payer: Self-pay

## 2024-11-10 VITALS — BP 122/61 | HR 68 | Temp 97.8°F | Ht 67.0 in | Wt 235.2 lb

## 2024-11-10 DIAGNOSIS — I10 Essential (primary) hypertension: Secondary | ICD-10-CM

## 2024-11-10 DIAGNOSIS — F339 Major depressive disorder, recurrent, unspecified: Secondary | ICD-10-CM

## 2024-11-10 DIAGNOSIS — N898 Other specified noninflammatory disorders of vagina: Secondary | ICD-10-CM

## 2024-11-10 DIAGNOSIS — F3340 Major depressive disorder, recurrent, in remission, unspecified: Secondary | ICD-10-CM | POA: Insufficient documentation

## 2024-11-10 DIAGNOSIS — E1142 Type 2 diabetes mellitus with diabetic polyneuropathy: Secondary | ICD-10-CM

## 2024-11-10 DIAGNOSIS — R350 Frequency of micturition: Secondary | ICD-10-CM | POA: Insufficient documentation

## 2024-11-10 DIAGNOSIS — F3341 Major depressive disorder, recurrent, in partial remission: Secondary | ICD-10-CM | POA: Diagnosis not present

## 2024-11-10 LAB — POCT GLYCOSYLATED HEMOGLOBIN (HGB A1C): HbA1c, POC (controlled diabetic range): 7.2 % — AB (ref 0.0–7.0)

## 2024-11-10 LAB — GLUCOSE, CAPILLARY: Glucose-Capillary: 194 mg/dL — ABNORMAL HIGH (ref 70–99)

## 2024-11-10 MED ORDER — INSULIN GLARGINE SOLOSTAR 100 UNIT/ML ~~LOC~~ SOPN
PEN_INJECTOR | SUBCUTANEOUS | 3 refills | Status: AC
  Filled 2024-11-10 – 2024-11-30 (×2): qty 30, 44d supply, fill #0
  Filled 2025-01-03: qty 30, 44d supply, fill #1
  Filled 2025-01-26: qty 30, 44d supply, fill #2

## 2024-11-10 NOTE — Patient Instructions (Addendum)
 Thank you, Ms.Sayre P Dennin for allowing us  to provide your care today. Today we discussed the following:  - For your vaginal itching/burning/urinary frequency, please try over-the-counter boric acid suppository.  We are currently testing your swab specimen and if it is positive, we will call you and start a new medicine. - We are also testing your urine to make sure that you do not have a urinary tract infection. - For your diabetes, we will increase your Lantus  to 36 units in the morning and keep 32 units at night.  We would like to see you in 3 months again for another A1c check  I have ordered the following labs for you:  Lab Orders         Glucose, capillary         UA/M w/rflx Culture, Routine         POC Hbg A1C      Tests ordered today:  Vaginal Self-swab  Referrals ordered today:   Referral Orders  No referral(s) requested today     I have ordered the following medication/changed the following medications:   Stop the following medications: Medications Discontinued During This Encounter  Medication Reason   Insulin  Glargine Solostar (LANTUS ) 100 UNIT/ML Solostar Pen      Start the following medications: Meds ordered this encounter  Medications   Insulin  Glargine Solostar (LANTUS ) 100 UNIT/ML Solostar Pen    Sig: Inject 36 Units into the skin in the morning and 32 Units into the skin at night.    Dispense:  30 mL    Refill:  3     Follow up: 3 months    Remember:   Should you have any questions or concerns please call the Internal Medicine Clinic at [phone].     Ava Tangney, MD [facility]

## 2024-11-10 NOTE — Progress Notes (Signed)
 Internal Medicine Clinic Attending  I was physically present during the key portions of the resident provided service and participated in the medical decision making of patient's management care. I reviewed pertinent patient test results.  The assessment, diagnosis, and plan were formulated together and I agree with the documentation in the resident's note.  Shawn Sick, MD

## 2024-11-10 NOTE — Assessment & Plan Note (Signed)
 BP today is 122/61.  Current regimen includes lisinopril  10 mg daily.  No changes necessary.  Will continue current regimen.

## 2024-11-10 NOTE — Assessment & Plan Note (Signed)
 Patient's week long vaginal itching/burning sensation in context of abstention from sexual activity concerning for yeast infection.  Symptoms have mildly improved with Monistat.  Will hold off on empirically treating symptoms in favor of waiting for self swab Aptima testing for GC/chlamydia/BV/Candida.  Counseled the patient on over-the-counter boric acid suppository if your symptoms are worsening. - Aptima self swab pending - OTC boric acid suppository as needed

## 2024-11-10 NOTE — Assessment & Plan Note (Addendum)
 Patient does endorse urinary frequency and urinary urgency for the past few weeks.  No flank pain.  She does take Farxiga  for her diabetes which may increase risk of UTI/yeast infection.  No lower abdominal tenderness/fullness on exam.  Flanks not tender to palpation.  Plan to check UA with reflex to culture and send antibiotics if positive. - UA with reflex to culture pending

## 2024-11-10 NOTE — Assessment & Plan Note (Addendum)
 A1c today is 7.2%. Last A1c is 7.0% on 08/09/2024. Current regimen includes Lantus  32u bid, Farxiga  10mg  daily, Ozempic  2mg  weekly but haven't had since 4-6 weeks ago. Ozempic  is waiting at the pharmacy but patient has not had the money to afford the copay. Gabapentin  300mg  tid for neuropathy. Glucose log from Dexcom showing average glucose of 227 and 34% above goal. No hypoglycemic events. Blood glucoses are mostly elevated during day/evening.  Suspect that patient's A1c has increased due to stopping Ozempic . - increase Lantus  morning dose to 36u and keep evening dose of 32u - will discontinue Ozempic  from patient's medication list - if patient is able to afford ozempic  she will need to be started at lowest dose - Continue Farxiga  10 mg daily - Continue gabapentin  300 mg 3 times daily for neuropathy

## 2024-11-11 ENCOUNTER — Ambulatory Visit: Payer: Self-pay

## 2024-11-11 LAB — UA/M W/RFLX CULTURE, ROUTINE
Bilirubin, UA: NEGATIVE
Ketones, UA: NEGATIVE
Leukocytes,UA: NEGATIVE
Nitrite, UA: NEGATIVE
Protein,UA: NEGATIVE
RBC, UA: NEGATIVE
Specific Gravity, UA: >=1.03 — AB (ref 1.005–1.030)
Urobilinogen, Ur: 0.2 mg/dL (ref 0.2–1.0)
pH, UA: 7 (ref 5.0–7.5)

## 2024-11-11 LAB — CERVICOVAGINAL ANCILLARY ONLY
Bacterial Vaginitis (gardnerella): NEGATIVE
Candida Glabrata: POSITIVE — AB
Candida Vaginitis: POSITIVE — AB
Chlamydia: NEGATIVE
Comment: NEGATIVE
Comment: NEGATIVE
Comment: NEGATIVE
Comment: NEGATIVE
Comment: NEGATIVE
Comment: NORMAL
Neisseria Gonorrhea: NEGATIVE
Trichomonas: NEGATIVE

## 2024-11-11 LAB — MICROSCOPIC EXAMINATION
Bacteria, UA: NONE SEEN
Casts: NONE SEEN /LPF
RBC, Urine: NONE SEEN /HPF (ref 0–2)
WBC, UA: NONE SEEN /HPF (ref 0–5)

## 2024-11-12 ENCOUNTER — Other Ambulatory Visit (HOSPITAL_COMMUNITY): Payer: Self-pay

## 2024-11-15 ENCOUNTER — Other Ambulatory Visit (HOSPITAL_COMMUNITY): Payer: Self-pay

## 2024-11-15 ENCOUNTER — Other Ambulatory Visit: Payer: Self-pay

## 2024-11-19 ENCOUNTER — Other Ambulatory Visit (HOSPITAL_COMMUNITY): Payer: Self-pay

## 2024-11-22 ENCOUNTER — Other Ambulatory Visit: Payer: Self-pay

## 2024-11-22 ENCOUNTER — Other Ambulatory Visit (HOSPITAL_COMMUNITY): Payer: Self-pay

## 2024-12-01 ENCOUNTER — Other Ambulatory Visit (HOSPITAL_COMMUNITY): Payer: Self-pay

## 2024-12-01 ENCOUNTER — Other Ambulatory Visit: Payer: Self-pay

## 2024-12-02 ENCOUNTER — Other Ambulatory Visit (HOSPITAL_COMMUNITY): Payer: Self-pay

## 2024-12-03 ENCOUNTER — Other Ambulatory Visit: Payer: Self-pay

## 2024-12-03 ENCOUNTER — Other Ambulatory Visit (HOSPITAL_COMMUNITY): Payer: Self-pay

## 2024-12-15 ENCOUNTER — Other Ambulatory Visit (HOSPITAL_COMMUNITY): Payer: Self-pay

## 2024-12-16 ENCOUNTER — Other Ambulatory Visit (HOSPITAL_COMMUNITY): Payer: Self-pay

## 2024-12-28 ENCOUNTER — Other Ambulatory Visit (HOSPITAL_COMMUNITY): Payer: Self-pay

## 2024-12-29 ENCOUNTER — Other Ambulatory Visit (HOSPITAL_COMMUNITY): Payer: Self-pay

## 2025-01-03 ENCOUNTER — Other Ambulatory Visit: Payer: Self-pay

## 2025-01-03 ENCOUNTER — Other Ambulatory Visit: Payer: Self-pay | Admitting: Internal Medicine

## 2025-01-03 ENCOUNTER — Other Ambulatory Visit (HOSPITAL_COMMUNITY): Payer: Self-pay

## 2025-01-03 DIAGNOSIS — E7800 Pure hypercholesterolemia, unspecified: Secondary | ICD-10-CM

## 2025-01-03 DIAGNOSIS — E1142 Type 2 diabetes mellitus with diabetic polyneuropathy: Secondary | ICD-10-CM

## 2025-01-03 MED ORDER — ATORVASTATIN CALCIUM 40 MG PO TABS
40.0000 mg | ORAL_TABLET | Freq: Every day | ORAL | 2 refills | Status: AC
  Filled 2025-01-03 – 2025-01-26 (×3): qty 90, 90d supply, fill #0

## 2025-01-03 MED ORDER — OZEMPIC (2 MG/DOSE) 8 MG/3ML ~~LOC~~ SOPN
2.0000 mg | PEN_INJECTOR | SUBCUTANEOUS | 3 refills | Status: AC
  Filled 2025-01-03 – 2025-01-05 (×2): qty 3, 28d supply, fill #0
  Filled 2025-01-26: qty 3, 28d supply, fill #1

## 2025-01-03 MED ORDER — BENZONATATE 100 MG PO CAPS
200.0000 mg | ORAL_CAPSULE | Freq: Three times a day (TID) | ORAL | 1 refills | Status: AC | PRN
  Filled 2025-01-03: qty 30, 5d supply, fill #0
  Filled 2025-01-26: qty 30, 5d supply, fill #1

## 2025-01-03 MED FILL — Bupropion HCl Tab ER 24HR 150 MG: ORAL | 30 days supply | Qty: 30 | Fill #0 | Status: AC

## 2025-01-03 NOTE — Telephone Encounter (Signed)
 Medication sent to pharmacy

## 2025-01-04 ENCOUNTER — Other Ambulatory Visit: Payer: Self-pay

## 2025-01-04 ENCOUNTER — Other Ambulatory Visit (HOSPITAL_COMMUNITY): Payer: Self-pay

## 2025-01-05 ENCOUNTER — Other Ambulatory Visit (HOSPITAL_COMMUNITY): Payer: Self-pay

## 2025-01-05 ENCOUNTER — Other Ambulatory Visit: Payer: Self-pay

## 2025-01-05 ENCOUNTER — Ambulatory Visit (INDEPENDENT_AMBULATORY_CARE_PROVIDER_SITE_OTHER)

## 2025-01-05 VITALS — BP 127/76 | HR 75 | Temp 98.0°F | Ht 67.0 in | Wt 224.0 lb

## 2025-01-05 DIAGNOSIS — Z87891 Personal history of nicotine dependence: Secondary | ICD-10-CM | POA: Diagnosis not present

## 2025-01-05 DIAGNOSIS — R42 Dizziness and giddiness: Secondary | ICD-10-CM | POA: Diagnosis not present

## 2025-01-05 DIAGNOSIS — J209 Acute bronchitis, unspecified: Secondary | ICD-10-CM | POA: Diagnosis present

## 2025-01-05 MED ORDER — GUAIFENESIN-CODEINE 100-10 MG/5ML PO SOLN
5.0000 mL | Freq: Three times a day (TID) | ORAL | 0 refills | Status: AC | PRN
  Filled 2025-01-05: qty 45, 3d supply, fill #0

## 2025-01-05 NOTE — Assessment & Plan Note (Signed)
 One week of nasal congestion with postnasal drip and night-time cough, similar to prior bronchitis flares triggered by weather changes. Sputum white/yellow and unchanged from baseline. No fever, shortness of breath, chest pain, or focal lung findings. Lung exam clear. Presentation most consistent with upper airway cough syndrome with mild bronchitis, without evidence of pneumonia or bacterial infection.  Plan:  Robitussin with codeine  TID as needed for up to 3 days for night-time cough Continue benzonatate  as needed Saline nasal spray daily for congestion and mucus clearance Continue albuterol  inhaler PRN  Encourage hydration  No antibiotics, steroids, or imaging at this time Return precautions reviewed (fever, worsening SOB, chest pain, worsening or prolonged cough)

## 2025-01-05 NOTE — Progress Notes (Signed)
 "  CC: Cough and congestion  HPI:  [name] is a 59 y.o. female with a history of recurrent bronchitis, type 2 diabetes mellitus, hypertension, hyperlipidemia, obstructive sleep apnea (not on CPAP), and depression, who presents today for evaluation of nasal congestion and cough. She reports that symptoms began about one week ago, starting with nasal congestion, followed by mucus draining into her throat, which triggers intermittent coughing and choking, especially at night when lying down. She describes sputum as white to yellow, similar to her baseline during prior bronchitis flares, without a significant increase in cough frequency or severity. She denies fever, shortness of breath, chest pain, wheezing, or body aches, though she notes mild chills. She believes symptoms represent an early bronchitis flare, as she typically experiences similar episodes with weather changes each year. She has been using albuterol  inhaler, benzonatate , and Flonase , though reports nasal burning with Flonase . She also endorses intermittent dizziness when standing from bed, without syncope or falls. No recent sick contacts.  Past Medical History:  Diagnosis Date   Anal fissure    Breast mass    Bronchitis    Carpal tunnel syndrome, bilateral    Cholelithiasis    Depression    Diabetes mellitus    Type II   Essential hypertension    GERD (gastroesophageal reflux disease)    Herpes simplex type 1 infection    vaginal   Hypokalemia    2/2 diarrhea   Menorrhagia    Obesity    Polycystic ovarian disease    Postural dizziness with presyncope 11/29/2013   Renal calculi    Renal calculus    Sleep apnea    no cpap   Sleep apnea, obstructive     Medications Ordered Prior to Encounter[1]  Family History  Problem Relation Age of Onset   Hypertension Mother    Breast cancer Mother 76 - 6       recur @ age 71's   Diabetes Father    Breast cancer Maternal Aunt 1 - 59   Colon cancer Neg Hx     Esophageal cancer Neg Hx    Stomach cancer Neg Hx    Rectal cancer Neg Hx     Social History   Socioeconomic History   Marital status: Married    Spouse name: Not on file   Number of children: 2   Years of education: Not on file   Highest education level: 9th grade  Occupational History   Occupation: Deli    Employer: [employer]  Tobacco Use   Smoking status: Some Days    Current packs/day: 0.00    Types: Cigarettes    Last attempt to quit: 12/30/1978    Years since quitting: 46.0   Smokeless tobacco: Never  Vaping Use   Vaping status: Never Used  Substance and Sexual Activity   Alcohol use: Not Currently    Comment: occ.   Drug use: No   Sexual activity: Yes    Birth control/protection: Post-menopausal  Other Topics Concern   Not on file  Social History Narrative    Smoked many years ago drinks 1-2 beers on Friday and Saturdays no illegal drug use. Married and lives with her husband and sister-in-law son daughter and grandson.   Financial assistance approved for 100% discount at [facility] and has [facility] card per Barnie Potters as of August 06, 2010 6:27 PM   Social Drivers of Health   Tobacco Use: High Risk (11/10/2024)   Patient History  Smoking Tobacco Use: Some Days    Smokeless Tobacco Use: Never    Passive Exposure: Not on file  Financial Resource Strain: Low Risk (01/05/2025)   Overall Financial Resource Strain (CARDIA)    Difficulty of Paying Living Expenses: Not hard at all  Food Insecurity: Food Insecurity Present (01/05/2025)   Epic    Worried About Programme Researcher, Broadcasting/film/video in the Last Year: Sometimes true    Ran Out of Food in the Last Year: Sometimes true  Transportation Needs: No Transportation Needs (01/05/2025)   Epic    Lack of Transportation (Medical): No    Lack of Transportation (Non-Medical): No  Physical Activity: Inactive (01/05/2025)   Exercise Vital Sign    Days of Exercise per Week: 0 days    Minutes of Exercise per Session: 0 min  Stress: No Stress  Concern Present (01/05/2025)   [employer] of Occupational Health - Occupational Stress Questionnaire    Feeling of Stress: Only a little  Social Connections: Unknown (01/05/2025)   Social Connection and Isolation Panel    Frequency of Communication with Friends and Family: More than three times a week    Frequency of Social Gatherings with Friends and Family: Twice a week    Attends Religious Services: Never    Database Administrator or Organizations: No    Attends Banker Meetings: Never    Marital Status: Patient declined  Recent Concern: Social Connections - Moderately Isolated (01/04/2025)   Social Connection and Isolation Panel    Frequency of Communication with Friends and Family: Three times a week    Frequency of Social Gatherings with Friends and Family: Twice a week    Attends Religious Services: Never    Database Administrator or Organizations: No    Attends Engineer, Structural: Not on file    Marital Status: Married  Catering Manager Violence: Not At Risk (01/05/2025)   Epic    Fear of Current or Ex-Partner: No    Emotionally Abused: No    Physically Abused: No    Sexually Abused: No  Depression (PHQ2-9): Low Risk (01/05/2025)   Depression (PHQ2-9)    PHQ-2 Score: 0  Alcohol Screen: Low Risk (01/05/2025)   Alcohol Screen    Last Alcohol Screening Score (AUDIT): 0  Housing: Low Risk (01/05/2025)   Epic    Unable to Pay for Housing in the Last Year: No    Number of Times Moved in the Last Year: 0    Homeless in the Last Year: No  Utilities: Patient Declined (01/05/2025)   Epic    Threatened with loss of utilities: Patient declined  Health Literacy: Adequate Health Literacy (01/05/2025)   B1300 Health Literacy    Frequency of need for help with medical instructions: Never    Review of Systems: ROS negative except for what is noted on the assessment and plan.  Vitals:   01/05/25 1007  BP: 127/76  Pulse: 75  Temp: 98 F (36.7 C)  TempSrc: Oral   SpO2: 95%  Weight: 224 lb (101.6 kg)  Height: 5' 7 (1.702 m)    Physical Exam  Physical Exam: Constitutional: well-appearing female sitting in chair, in no acute distress HENT: normocephalic atraumatic, mucous membranes moist, nasal congestion present Eyes: conjunctiva non-erythematous Neck: supple Cardiovascular: regular rate and rhythm, no murmurs, rubs, or gallops Pulmonary/Chest: normal work of breathing on room air, lungs clear to auscultation bilaterally without wheezes, rales, or rhonchi Abdominal: soft, non-tender, non-distended MSK: no lower  extremity edema Neurological: alert and oriented x3, normal gait Skin: warm and dry Psych: normal mood and affect, cooperative  Assessment & Plan:   Acute bronchitis One week of nasal congestion with postnasal drip and night-time cough, similar to prior bronchitis flares triggered by weather changes. Sputum white/yellow and unchanged from baseline. No fever, shortness of breath, chest pain, or focal lung findings. Lung exam clear. Presentation most consistent with upper airway cough syndrome with mild bronchitis, without evidence of pneumonia or bacterial infection.  Plan:  Robitussin with codeine  TID as needed for up to 3 days for night-time cough Continue benzonatate  as needed Saline nasal spray daily for congestion and mucus clearance Continue albuterol  inhaler PRN  Encourage hydration  No antibiotics, steroids, or imaging at this time Return precautions reviewed (fever, worsening SOB, chest pain, worsening or prolonged cough)  Dizziness Patient reports intermittent lightheadedness when standing from bed. Orthostatic blood pressures were checked and are normal. No syncope, falls, chest pain, palpitations, focal neurologic symptoms, or red flags. Vitals stable today. Symptoms are most consistent with benign positional lightheadedness, possibly related to mild dehydration or rapid position changes, with low concern for cardiac,  neurologic, or medication-related pathology at this time. Plan: Encourage slow position changes and adequate hydration Home BP monitoring encouraged Reassess if symptoms persist or worsen   Patient discussed with Dr. Francesco Armando Rossetti M.D [facility] Internal Medicine, PGY-1 Phone: [phone] Date 01/05/2025 Time 11:54 AM       [1]  Current Outpatient Medications on File Prior to Visit  Medication Sig Dispense Refill   Accu-Chek Softclix Lancets lancets Test 4 (four) times daily. 100 each 12   albuterol  (VENTOLIN  HFA) 108 (90 Base) MCG/ACT inhaler Inhale 1-2 puffs into the lungs every 6 (six) hours as needed (cough). 18 g 2   atorvastatin  (LIPITOR) 40 MG tablet Take 1 tablet (40 mg total) by mouth daily. 90 tablet 2   benzonatate  (TESSALON ) 100 MG capsule Take 2 capsules (200 mg total) by mouth 3 (three) times daily as needed for cough. 30 capsule 1   buPROPion  (WELLBUTRIN  XL) 150 MG 24 hr tablet Take 1 tablet (150 mg total) by mouth daily. 30 tablet 11   Continuous Glucose Receiver (DEXCOM G7 RECEIVER) DEVI Please use this receiver for continuous glucose monitoring if you are unable to use the SmartPhone app. 1 each 0   Continuous Glucose Sensor (DEXCOM G7 SENSOR) MISC Please attach to your arm for continuous glucose monitoring. You can wear these for 10 days at a time and then replace with a new sensor. 9 each 3   dapagliflozin  propanediol (FARXIGA ) 10 MG TABS tablet Take 1 tablet (10 mg total) by mouth daily. 30 tablet 11   diclofenac  Sodium (VOLTAREN ) 1 % GEL Apply 4 g topically 4 (four) times daily. (Patient not taking: Reported on 04/26/2024) 100 g 1   dicyclomine  (BENTYL ) 20 MG tablet Take 1 tablet (20 mg total) by mouth 3 (three) times daily as needed for spasms. 50 tablet 2   famotidine  (PEPCID ) 40 MG tablet Take 1 tablet (40 mg total) by mouth at bedtime. 90 tablet 0   FLUoxetine  (PROZAC ) 40 MG capsule Take 2 capsules (80 mg total) by mouth daily. 120 capsule 5    fluticasone  (FLONASE ) 50 MCG/ACT nasal spray Place 1 spray into both nostrils daily. 16 g 2   gabapentin  (NEURONTIN ) 300 MG capsule Take 1 capsule (300 mg total) by mouth 3 (three) times daily. 90 capsule 3   glucose blood (ACCU-CHEK GUIDE  TEST) test strip Use to test blood glucose up to 4 times per day. 200 strip 12   Insulin  Glargine Solostar (LANTUS ) 100 UNIT/ML Solostar Pen Inject 36 Units into the skin in the morning and 32 Units into the skin at night. 30 mL 3   Insulin  Pen Needle 32G X 6 MM MISC Please use to inject insulin  five times daily. 500 each 11   linaclotide  (LINZESS ) 145 MCG CAPS capsule Take 1 capsule (145 mcg total) by mouth daily before breakfast. Exp: 02-2025, lot: [id number] 8 capsule 0   linaclotide  (LINZESS ) 290 MCG CAPS capsule Take 1 capsule (290 mcg total) by mouth daily before breakfast. Exp: 05-2024, lot: [id number] 8 capsule 0   lipase/protease/amylase (CREON ) 36000 UNITS CPEP capsule Take 2 capsules before large meals, and 1 capsule with snacks (Patient not taking: Reported on 04/26/2024) 300 capsule 2   lisinopril  (ZESTRIL ) 10 MG tablet Take 1 tablet (10 mg total) by mouth daily. 90 tablet 3   Microlet Lancets MISC Use to check blood glucose up to 4 times per day. 200 each 12   Semaglutide , 2 MG/DOSE, (OZEMPIC , 2 MG/DOSE,) 8 MG/3ML SOPN Inject 2 mg into the skin once a week. 3 mL 3   No current facility-administered medications on file prior to visit.   "

## 2025-01-05 NOTE — Patient Instructions (Addendum)
 Thank you, [name] for allowing us  to provide your care today.  Today we talked about your nasal congestion and night-time cough, which are most consistent with mucus drainage and bronchitis. Your exam was reassuring, with no signs of a serious infection.  Take Robitussin with codeine  at bedtime for up to 3 days to help calm your night-time cough and improve sleep. Use saline nasal spray daily to help clear congestion and reduce mucus. Drink plenty of fluids.  Please call us  if you develop fever, shortness of breath, chest pain, or if your symptoms worsen or do not improve.   I have ordered the following medication/changed the following medications:   Stop the following medications: There are no discontinued medications.   Start the following medications: Meds ordered this encounter  Medications   guaiFENesin -codeine  100-10 MG/5ML syrup    Sig: Take 5 mLs by mouth 3 (three) times daily as needed for up to 3 days for cough.    Dispense:  45 mL    Refill:  0     Follow up: 4 month    Remember:   Should you have any questions or concerns please call the internal medicine clinic at [phone].    Armando Rossetti, M.D [facility]

## 2025-01-05 NOTE — Assessment & Plan Note (Signed)
 Patient reports intermittent lightheadedness when standing from bed. Orthostatic blood pressures were checked and are normal. No syncope, falls, chest pain, palpitations, focal neurologic symptoms, or red flags. Vitals stable today. Symptoms are most consistent with benign positional lightheadedness, possibly related to mild dehydration or rapid position changes, with low concern for cardiac, neurologic, or medication-related pathology at this time. Plan: Encourage slow position changes and adequate hydration Home BP monitoring encouraged Reassess if symptoms persist or worsen

## 2025-01-07 NOTE — Progress Notes (Signed)
 Internal Medicine Clinic Attending  Case discussed with the resident at the time of the visit.  We reviewed the resident's history and exam and pertinent patient test results.  I agree with the assessment, diagnosis, and plan of care documented in the resident's note.

## 2025-01-26 ENCOUNTER — Other Ambulatory Visit: Payer: Self-pay

## 2025-01-26 ENCOUNTER — Other Ambulatory Visit: Payer: Self-pay | Admitting: Student

## 2025-01-26 ENCOUNTER — Other Ambulatory Visit (HOSPITAL_COMMUNITY): Payer: Self-pay

## 2025-01-26 DIAGNOSIS — E1142 Type 2 diabetes mellitus with diabetic polyneuropathy: Secondary | ICD-10-CM

## 2025-01-26 MED ORDER — DEXCOM G7 RECEIVER DEVI
0 refills | Status: AC
  Filled 2025-01-26: qty 1, 34d supply, fill #0

## 2025-01-26 NOTE — Telephone Encounter (Signed)
 Medication sent to pharmacy

## 2025-01-27 ENCOUNTER — Other Ambulatory Visit (HOSPITAL_COMMUNITY): Payer: Self-pay

## 2025-01-27 MED FILL — Gabapentin Cap 300 MG: ORAL | 30 days supply | Qty: 90 | Fill #0 | Status: CN

## 2025-01-28 MED ORDER — TECHLITE PEN NEEDLES 32G X 6 MM MISC
11 refills | Status: AC
  Filled 2025-01-28: qty 500, 90d supply, fill #0

## 2025-01-29 ENCOUNTER — Other Ambulatory Visit (HOSPITAL_COMMUNITY): Payer: Self-pay

## 2025-01-31 ENCOUNTER — Other Ambulatory Visit: Payer: Self-pay | Admitting: Student

## 2025-01-31 ENCOUNTER — Other Ambulatory Visit (HOSPITAL_COMMUNITY): Payer: Self-pay

## 2025-01-31 DIAGNOSIS — E1142 Type 2 diabetes mellitus with diabetic polyneuropathy: Secondary | ICD-10-CM

## 2025-02-01 ENCOUNTER — Encounter (HOSPITAL_COMMUNITY): Payer: Self-pay

## 2025-02-01 ENCOUNTER — Other Ambulatory Visit (HOSPITAL_COMMUNITY): Payer: Self-pay

## 2025-02-01 NOTE — Telephone Encounter (Signed)
 Medication last refilled 01/27/25 with 90 capsules and 3 refills.

## 2025-02-02 ENCOUNTER — Other Ambulatory Visit (HOSPITAL_COMMUNITY): Payer: Self-pay

## 2025-02-02 MED FILL — Gabapentin Cap 300 MG: ORAL | 30 days supply | Qty: 90 | Fill #0 | Status: AC

## 2025-02-03 ENCOUNTER — Other Ambulatory Visit (HOSPITAL_COMMUNITY): Payer: Self-pay

## 2025-05-05 ENCOUNTER — Ambulatory Visit: Payer: Self-pay | Admitting: Student
